# Patient Record
Sex: Male | Born: 1950 | Race: Black or African American | Hispanic: No | Smoking: Never smoker
Health system: Southern US, Community
[De-identification: ages and names within clinical notes are randomized; demographics above are authoritative.]

## PROBLEM LIST (undated history)

## (undated) DIAGNOSIS — F32A Depression, unspecified: Secondary | ICD-10-CM

## (undated) DIAGNOSIS — R0600 Dyspnea, unspecified: Secondary | ICD-10-CM

## (undated) DIAGNOSIS — I219 Acute myocardial infarction, unspecified: Secondary | ICD-10-CM

## (undated) DIAGNOSIS — E785 Hyperlipidemia, unspecified: Secondary | ICD-10-CM

## (undated) DIAGNOSIS — R011 Cardiac murmur, unspecified: Secondary | ICD-10-CM

## (undated) DIAGNOSIS — I2510 Atherosclerotic heart disease of native coronary artery without angina pectoris: Secondary | ICD-10-CM

## (undated) DIAGNOSIS — I209 Angina pectoris, unspecified: Secondary | ICD-10-CM

## (undated) DIAGNOSIS — A809 Acute poliomyelitis, unspecified: Secondary | ICD-10-CM

## (undated) DIAGNOSIS — F419 Anxiety disorder, unspecified: Secondary | ICD-10-CM

## (undated) DIAGNOSIS — E119 Type 2 diabetes mellitus without complications: Secondary | ICD-10-CM

## (undated) DIAGNOSIS — I739 Peripheral vascular disease, unspecified: Secondary | ICD-10-CM

## (undated) DIAGNOSIS — I639 Cerebral infarction, unspecified: Secondary | ICD-10-CM

## (undated) DIAGNOSIS — I509 Heart failure, unspecified: Secondary | ICD-10-CM

## (undated) DIAGNOSIS — J45909 Unspecified asthma, uncomplicated: Secondary | ICD-10-CM

## (undated) DIAGNOSIS — I10 Essential (primary) hypertension: Secondary | ICD-10-CM

## (undated) DIAGNOSIS — F329 Major depressive disorder, single episode, unspecified: Secondary | ICD-10-CM

## (undated) DIAGNOSIS — M1990 Unspecified osteoarthritis, unspecified site: Secondary | ICD-10-CM

## (undated) DIAGNOSIS — N186 End stage renal disease: Secondary | ICD-10-CM

## (undated) HISTORY — DX: Type 2 diabetes mellitus without complications: E11.9

## (undated) HISTORY — PX: CORONARY ANGIOPLASTY: SHX604

## (undated) HISTORY — DX: Hyperlipidemia, unspecified: E78.5

## (undated) HISTORY — DX: Essential (primary) hypertension: I10

## (undated) HISTORY — PX: ENUCLEATION: SHX628

---

## 2001-07-04 HISTORY — PX: LEG AMPUTATION BELOW KNEE: SHX694

## 2004-02-02 DIAGNOSIS — S88919A Complete traumatic amputation of unspecified lower leg, level unspecified, initial encounter: Secondary | ICD-10-CM | POA: Insufficient documentation

## 2005-07-04 DIAGNOSIS — I639 Cerebral infarction, unspecified: Secondary | ICD-10-CM | POA: Insufficient documentation

## 2008-11-11 DIAGNOSIS — E113299 Type 2 diabetes mellitus with mild nonproliferative diabetic retinopathy without macular edema, unspecified eye: Secondary | ICD-10-CM | POA: Insufficient documentation

## 2012-12-12 DIAGNOSIS — I509 Heart failure, unspecified: Secondary | ICD-10-CM | POA: Insufficient documentation

## 2013-08-27 DIAGNOSIS — I770 Arteriovenous fistula, acquired: Secondary | ICD-10-CM | POA: Insufficient documentation

## 2013-11-01 DIAGNOSIS — B182 Chronic viral hepatitis C: Secondary | ICD-10-CM | POA: Insufficient documentation

## 2014-01-03 DIAGNOSIS — H539 Unspecified visual disturbance: Secondary | ICD-10-CM | POA: Insufficient documentation

## 2014-01-04 DIAGNOSIS — G459 Transient cerebral ischemic attack, unspecified: Secondary | ICD-10-CM | POA: Insufficient documentation

## 2014-03-17 DIAGNOSIS — E559 Vitamin D deficiency, unspecified: Secondary | ICD-10-CM | POA: Insufficient documentation

## 2014-12-04 DIAGNOSIS — E663 Overweight: Secondary | ICD-10-CM | POA: Insufficient documentation

## 2015-04-06 DIAGNOSIS — Z794 Long term (current) use of insulin: Secondary | ICD-10-CM | POA: Insufficient documentation

## 2015-10-27 DIAGNOSIS — I5022 Chronic systolic (congestive) heart failure: Secondary | ICD-10-CM | POA: Insufficient documentation

## 2016-01-21 DIAGNOSIS — F410 Panic disorder [episodic paroxysmal anxiety] without agoraphobia: Secondary | ICD-10-CM | POA: Insufficient documentation

## 2016-07-29 DIAGNOSIS — R58 Hemorrhage, not elsewhere classified: Secondary | ICD-10-CM | POA: Insufficient documentation

## 2016-12-09 ENCOUNTER — Encounter (INDEPENDENT_AMBULATORY_CARE_PROVIDER_SITE_OTHER): Payer: Self-pay | Admitting: Vascular Surgery

## 2016-12-12 ENCOUNTER — Encounter (INDEPENDENT_AMBULATORY_CARE_PROVIDER_SITE_OTHER): Payer: Self-pay | Admitting: Vascular Surgery

## 2016-12-12 ENCOUNTER — Ambulatory Visit (INDEPENDENT_AMBULATORY_CARE_PROVIDER_SITE_OTHER): Payer: Medicare Other | Admitting: Vascular Surgery

## 2016-12-12 DIAGNOSIS — E1152 Type 2 diabetes mellitus with diabetic peripheral angiopathy with gangrene: Secondary | ICD-10-CM

## 2016-12-12 DIAGNOSIS — E7800 Pure hypercholesterolemia, unspecified: Secondary | ICD-10-CM

## 2016-12-12 DIAGNOSIS — I10 Essential (primary) hypertension: Secondary | ICD-10-CM

## 2016-12-12 DIAGNOSIS — I7025 Atherosclerosis of native arteries of other extremities with ulceration: Secondary | ICD-10-CM | POA: Diagnosis not present

## 2016-12-12 DIAGNOSIS — L97909 Non-pressure chronic ulcer of unspecified part of unspecified lower leg with unspecified severity: Secondary | ICD-10-CM

## 2016-12-12 DIAGNOSIS — T8789 Other complications of amputation stump: Secondary | ICD-10-CM

## 2016-12-12 DIAGNOSIS — Z794 Long term (current) use of insulin: Secondary | ICD-10-CM | POA: Diagnosis not present

## 2016-12-20 DIAGNOSIS — T8789 Other complications of amputation stump: Secondary | ICD-10-CM

## 2016-12-20 DIAGNOSIS — I70219 Atherosclerosis of native arteries of extremities with intermittent claudication, unspecified extremity: Secondary | ICD-10-CM | POA: Insufficient documentation

## 2016-12-20 DIAGNOSIS — L97909 Non-pressure chronic ulcer of unspecified part of unspecified lower leg with unspecified severity: Secondary | ICD-10-CM | POA: Insufficient documentation

## 2016-12-20 DIAGNOSIS — I10 Essential (primary) hypertension: Secondary | ICD-10-CM | POA: Insufficient documentation

## 2016-12-20 DIAGNOSIS — E119 Type 2 diabetes mellitus without complications: Secondary | ICD-10-CM | POA: Insufficient documentation

## 2016-12-20 DIAGNOSIS — E7800 Pure hypercholesterolemia, unspecified: Secondary | ICD-10-CM | POA: Insufficient documentation

## 2016-12-20 NOTE — Progress Notes (Signed)
MRN : [id number]  Dennis Walker is a 66 y.o. ([birth year]) male who presents with chief complaint of  Chief Complaint  Patient presents with  . New Evaluation    Blisters around BKA  .  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  He states he developed the blisters of the right BKA stump after wearing his prosthesis for physical therapy. He had not been wearing his prosthesis routinely in months.  The patient notes the ulcer has been present for multiple weeks and has not been improving.  It is very painful and has had some drainage.  No specific history of trauma noted by the patient.  The patient denies fever or chills.  the patient does have diabetes which has been difficult to control.  Patient notes prior to the ulcer developing the extremities were painful particularly with ambulation or activity and the discomfort is very consistent day today. Typically, the pain occurs at less than one block, progress is as activity continues to the point that the patient must stop walking. Resting including standing still for several minutes allowed resumption of the activity and the ability to walk a similar distance before stopping again. Uneven terrain and inclined shorten the distance. The pain has been progressive over the past several years.   The patient denies rest pain or dangling of an extremity off the side of the bed during the night for relief. No prior interventions or surgeries.  No history of back problems or DJD of the lumbar sacral spine.   The patient denies amaurosis fugax or recent TIA symptoms. There are no recent neurological changes noted. The patient denies history of DVT, PE or superficial thrombophlebitis. The patient denies recent episodes of angina or shortness of breath.   Current Meds  Medication Sig  . albuterol (PROVENTIL  HFA;VENTOLIN HFA) 108 (90 Base) MCG/ACT inhaler Inhale into the lungs every 6 (six) hours as needed for wheezing or shortness of breath.  Marland Kitchen aspirin EC 81 MG tablet Take 81 mg by mouth daily.  Marland Kitchen atorvastatin (LIPITOR) 80 MG tablet Take 80 mg by mouth daily.  Marland Kitchen b complex vitamins capsule Take 1 capsule by mouth daily.  . clopidogrel (PLAVIX) 75 MG tablet Take 75 mg by mouth daily.  . ferrous sulfate 325 (65 FE) MG tablet Take 325 mg by mouth daily with breakfast.  . gabapentin (NEURONTIN) 100 MG capsule Take 100 mg by mouth 2 (two) times daily.  . Insulin Detemir (LEVEMIR FLEXPEN) 100 UNIT/ML Pen Inject 20 Units into the skin daily at 10 pm.  . lactulose, encephalopathy, (CHRONULAC) 10 GM/15ML SOLN Take by mouth.  Marland Kitchen lisinopril (PRINIVIL,ZESTRIL) 5 MG tablet Take 5 mg by mouth daily.  . Melatonin 1 MG CAPS Take by mouth.  . metolazone (ZAROXOLYN) 5 MG tablet Take 5 mg by mouth daily.  . metoprolol succinate (TOPROL-XL) 100 MG 24 hr tablet Take 100 mg by mouth daily. Take with or immediately following a meal.  . oxyCODONE-acetaminophen (PERCOCET/ROXICET) 5-325 MG tablet Take by mouth every 4 (four) hours as needed for severe pain.  . polyethylene glycol (MIRALAX / GLYCOLAX) packet Take 17 g by mouth daily.  . ranolazine (RANEXA) 500 MG 12 hr tablet Take 500 mg by mouth 2 (two) times daily.  Marland Kitchen senna (SENOKOT) 8.6 MG TABS tablet Take 1 tablet by mouth.  . sertraline (ZOLOFT) 100 MG tablet Take 100 mg  by mouth daily.  . sevelamer carbonate (RENVELA) 800 MG tablet Take 800 mg by mouth 3 (three) times daily with meals.    Past Medical History:  Diagnosis Date  . Chronic kidney disease   . Diabetes mellitus without complication ([place])   . Hyperlipidemia   . Hypertension     No past surgical history on file.  Social History Social History  Substance Use Topics  . Smoking status: Never Smoker  . Smokeless tobacco: Never Used  . Alcohol use No    Family History No family history on file. No  family history of bleeding/clotting disorders, porphyria or autoimmune disease   Allergies  Allergen Reactions  . Sulfa Antibiotics      REVIEW OF SYSTEMS (Negative unless checked)  Constitutional: [] Weight loss  [] Fever  [] Chills Cardiac: [] Chest pain   [] Chest pressure   [] Palpitations   [] Shortness of breath when laying flat   [] Shortness of breath with exertion. Vascular:  [x] Pain in legs with walking   [x] Pain in legs at rest  [] History of DVT   [] Phlebitis   [] Swelling in legs   [] Varicose veins   [x] Non-healing ulcers Pulmonary:   [] Uses home oxygen   [] Productive cough   [] Hemoptysis   [] Wheeze  [] COPD   [] Asthma Neurologic:  [] Dizziness   [] Seizures   [] History of stroke   [] History of TIA  [] Aphasia   [] Vissual changes   [] Weakness or numbness in arm   [] Weakness or numbness in leg Musculoskeletal:   [x] Joint swelling   [x] Joint pain   [] Low back pain Hematologic:  [] Easy bruising  [] Easy bleeding   [] Hypercoagulable state   [] Anemic Gastrointestinal:  [] Diarrhea   [] Vomiting  [] Gastroesophageal reflux/heartburn   [] Difficulty swallowing. Genitourinary:  [x] Chronic kidney disease   [] Difficult urination  [] Frequent urination   [] Blood in urine Skin:  [] Rashes   [] Ulcers  Psychological:  [] History of anxiety   []  History of major depression.  Physical Examination  Vitals:   12/12/16 1447  BP: 103/63  Pulse: (!) 58  Resp: 15  Weight: 83 kg (183 lb)  Height: 5' 9.5" (1.765 m)   Body mass index is 26.64 kg/m. Gen: WD/WN, NAD Head: /AT, No temporalis wasting.  Ear/Nose/Throat: Hearing grossly intact, nares w/o erythema or drainage, poor dentition Eyes: PER, EOMI, sclera nonicteric.  Neck: Supple, no masses.  No bruit or JVD.  Pulmonary:  Good air movement, clear to auscultation bilaterally, no use of accessory muscles.  Cardiac: RRR, normal S1, S2, no Murmurs. Vascular: Right lower knee amputation stump with 2 superficial blisters/ulcerations. No evidence of infection  no odor noted drainage.  Vessel Right Left  Radial Palpable Palpable  Femoral Not Palpable Not Palpable  Popliteal Not Palpable Not Palpable  PT BKA Not Palpable  DP BKA Not Palpable   Gastrointestinal: soft, non-distended. No guarding/no peritoneal signs.  Musculoskeletal: M/S 5/5 throughout.  No deformity or atrophy.  Neurologic: CN 2-12 intact. Pain and light touch intact in extremities.  Symmetrical.  Speech is fluent. Motor exam as listed above. Psychiatric: Judgment intact, Mood & affect appropriate for pt's clinical situation. Dermatologic: No rashes or ulcers noted.  No changes consistent with cellulitis. Lymph : No Cervical lymphadenopathy, no lichenification or skin changes of chronic lymphedema.  CBC No results found for: WBC, HGB, HCT, MCV, PLT  BMET No results found for: NA, K, CL, CO2, GLUCOSE, BUN, CREATININE, CALCIUM, GFRNONAA, GFRAA CrCl cannot be calculated (No order found.).  COAG No results found for: INR, PROTIME  Radiology No  results found.  Assessment/Plan 1. Atherosclerosis of native arteries of the extremities with ulceration ([place]) Recommend:  Patient should undergo arterial duplex of the lower extremity ASAP because there has been a significant deterioration in the patient's lower extremity status.  The patient states they are having increased pain and a marked decrease in the distance that they can walk.  The risks and benefits as well as the alternatives were discussed in detail with the patient.  All questions were answered.  Patient agrees to proceed and understands this could be a prelude to angiography and intervention.  The patient will follow up with me in the office to review the studies.  - VAS US AORTA/IVC/ILIACS; Future  2. Ulcer of amputation stump of lower extremity ([place]) See  #1 - VAS US AORTA/IVC/ILIACS; Future  3. Type 2 diabetes mellitus with diabetic peripheral angiopathy and gangrene, with long-term current use of insulin  (HCC) Continue hypoglycemic medications as already ordered, these medications have been reviewed and there are no changes at this time.  Hgb A1C to be monitored as already arranged by primary service   4. Essential hypertension Continue antihypertensive medications as already ordered, these medications have been reviewed and there are no changes at this time.   5. Pure hypercholesterolemia Continue statin as ordered and reviewed, no changes at this time     Hortencia Pilar, MD  12/20/2016 12:31 PM

## 2016-12-26 ENCOUNTER — Encounter (INDEPENDENT_AMBULATORY_CARE_PROVIDER_SITE_OTHER): Payer: Medicare Other

## 2016-12-26 ENCOUNTER — Ambulatory Visit (INDEPENDENT_AMBULATORY_CARE_PROVIDER_SITE_OTHER): Payer: Medicare Other | Admitting: Vascular Surgery

## 2017-01-05 ENCOUNTER — Encounter (INDEPENDENT_AMBULATORY_CARE_PROVIDER_SITE_OTHER): Payer: Medicare Other

## 2017-01-05 ENCOUNTER — Ambulatory Visit (INDEPENDENT_AMBULATORY_CARE_PROVIDER_SITE_OTHER): Payer: Medicare Other | Admitting: Vascular Surgery

## 2017-01-26 ENCOUNTER — Encounter (INDEPENDENT_AMBULATORY_CARE_PROVIDER_SITE_OTHER): Payer: Self-pay

## 2017-01-27 ENCOUNTER — Other Ambulatory Visit (INDEPENDENT_AMBULATORY_CARE_PROVIDER_SITE_OTHER): Payer: Self-pay

## 2017-01-31 ENCOUNTER — Encounter
Admission: RE | Disposition: A | Discharge status: Skilled Nursing Facility | Payer: Self-pay | Source: Ambulatory Visit | Attending: Vascular Surgery

## 2017-01-31 ENCOUNTER — Ambulatory Visit
Admission: RE | Admit: 2017-01-31 | Discharge: 2017-01-31 | Disposition: A | Discharge status: Skilled Nursing Facility | Payer: Medicare Other | Source: Ambulatory Visit | Attending: Vascular Surgery | Admitting: Vascular Surgery

## 2017-01-31 DIAGNOSIS — I2510 Atherosclerotic heart disease of native coronary artery without angina pectoris: Secondary | ICD-10-CM | POA: Insufficient documentation

## 2017-01-31 DIAGNOSIS — I120 Hypertensive chronic kidney disease with stage 5 chronic kidney disease or end stage renal disease: Secondary | ICD-10-CM | POA: Insufficient documentation

## 2017-01-31 DIAGNOSIS — Y832 Surgical operation with anastomosis, bypass or graft as the cause of abnormal reaction of the patient, or of later complication, without mention of misadventure at the time of the procedure: Secondary | ICD-10-CM | POA: Insufficient documentation

## 2017-01-31 DIAGNOSIS — T82868A Thrombosis of vascular prosthetic devices, implants and grafts, initial encounter: Secondary | ICD-10-CM

## 2017-01-31 DIAGNOSIS — T82858A Stenosis of vascular prosthetic devices, implants and grafts, initial encounter: Secondary | ICD-10-CM | POA: Insufficient documentation

## 2017-01-31 DIAGNOSIS — Z992 Dependence on renal dialysis: Secondary | ICD-10-CM | POA: Diagnosis not present

## 2017-01-31 DIAGNOSIS — N186 End stage renal disease: Secondary | ICD-10-CM

## 2017-01-31 DIAGNOSIS — Z882 Allergy status to sulfonamides status: Secondary | ICD-10-CM | POA: Diagnosis not present

## 2017-01-31 DIAGNOSIS — Z91013 Allergy to seafood: Secondary | ICD-10-CM | POA: Insufficient documentation

## 2017-01-31 DIAGNOSIS — E785 Hyperlipidemia, unspecified: Secondary | ICD-10-CM | POA: Insufficient documentation

## 2017-01-31 DIAGNOSIS — Z881 Allergy status to other antibiotic agents status: Secondary | ICD-10-CM | POA: Diagnosis not present

## 2017-01-31 DIAGNOSIS — E1122 Type 2 diabetes mellitus with diabetic chronic kidney disease: Secondary | ICD-10-CM | POA: Diagnosis not present

## 2017-01-31 DIAGNOSIS — I10 Essential (primary) hypertension: Secondary | ICD-10-CM | POA: Diagnosis not present

## 2017-01-31 HISTORY — PX: A/V FISTULAGRAM: CATH118298

## 2017-01-31 LAB — GLUCOSE, CAPILLARY
GLUCOSE-CAPILLARY: 84 mg/dL (ref 65–99)
GLUCOSE-CAPILLARY: 134 mg/dL — AB (ref 65–99)
GLUCOSE-CAPILLARY: 148 mg/dL — AB (ref 65–99)
Glucose-Capillary: 84 mg/dL (ref 65–99)

## 2017-01-31 LAB — POTASSIUM (ARMC VASCULAR LAB ONLY): POTASSIUM (ARMC VASCULAR LAB): 4.5 (ref 3.5–5.1)

## 2017-01-31 SURGERY — A/V FISTULAGRAM
Anesthesia: Moderate Sedation | Site: Arm Upper | Laterality: Left

## 2017-01-31 MED ORDER — FAMOTIDINE 20 MG PO TABS
40.0000 mg | ORAL_TABLET | Freq: Once | ORAL | Status: AC
  Administered 2017-01-31: 40 mg via ORAL

## 2017-01-31 MED ORDER — HEPARIN SODIUM (PORCINE) 1000 UNIT/ML IJ SOLN
INTRAMUSCULAR | Status: AC
  Filled 2017-01-31: qty 1

## 2017-01-31 MED ORDER — CLINDAMYCIN PHOSPHATE 300 MG/50ML IV SOLN
INTRAVENOUS | Status: AC
  Administered 2017-01-31: 300 mg via INTRAVENOUS
  Filled 2017-01-31: qty 50

## 2017-01-31 MED ORDER — METHYLPREDNISOLONE SODIUM SUCC 125 MG IJ SOLR
125.0000 mg | Freq: Once | INTRAMUSCULAR | Status: AC
  Administered 2017-01-31: 125 mg via INTRAVENOUS

## 2017-01-31 MED ORDER — DEXTROSE 50 % IV SOLN
12.5000 g | Freq: Once | INTRAVENOUS | Status: AC
  Administered 2017-01-31: 12.5 g via INTRAVENOUS

## 2017-01-31 MED ORDER — LIDOCAINE HCL (PF) 1 % IJ SOLN
INTRAMUSCULAR | Status: AC
  Filled 2017-01-31: qty 30

## 2017-01-31 MED ORDER — FENTANYL CITRATE (PF) 100 MCG/2ML IJ SOLN
INTRAMUSCULAR | Status: DC | PRN
  Administered 2017-01-31: 25 ug via INTRAVENOUS
  Administered 2017-01-31: 50 ug via INTRAVENOUS

## 2017-01-31 MED ORDER — MIDAZOLAM HCL 5 MG/5ML IJ SOLN
INTRAMUSCULAR | Status: AC
  Filled 2017-01-31: qty 5

## 2017-01-31 MED ORDER — MIDAZOLAM HCL 2 MG/2ML IJ SOLN
INTRAMUSCULAR | Status: DC | PRN
  Administered 2017-01-31 (×2): 0.5 mg via INTRAVENOUS
  Administered 2017-01-31: 2 mg via INTRAVENOUS

## 2017-01-31 MED ORDER — FAMOTIDINE 20 MG PO TABS
ORAL_TABLET | ORAL | Status: AC
  Filled 2017-01-31: qty 2

## 2017-01-31 MED ORDER — SODIUM CHLORIDE 0.9 % IV SOLN
INTRAVENOUS | Status: DC
  Administered 2017-01-31: 13:00:00 via INTRAVENOUS

## 2017-01-31 MED ORDER — DEXTROSE 50 % IV SOLN
INTRAVENOUS | Status: AC
  Filled 2017-01-31: qty 50

## 2017-01-31 MED ORDER — FENTANYL CITRATE (PF) 100 MCG/2ML IJ SOLN
INTRAMUSCULAR | Status: AC
  Filled 2017-01-31: qty 2

## 2017-01-31 MED ORDER — CLINDAMYCIN PHOSPHATE 300 MG/50ML IV SOLN
300.0000 mg | Freq: Once | INTRAVENOUS | Status: AC
  Administered 2017-01-31: 300 mg via INTRAVENOUS

## 2017-01-31 MED ORDER — METHYLPREDNISOLONE SODIUM SUCC 125 MG IJ SOLR
INTRAMUSCULAR | Status: AC
  Filled 2017-01-31: qty 2

## 2017-01-31 MED ORDER — CEFAZOLIN SODIUM-DEXTROSE 2-4 GM/100ML-% IV SOLN: 2.0000 g | Freq: Once | INTRAVENOUS | Status: DC

## 2017-01-31 SURGICAL SUPPLY — 26 items
BALLN ATG 12X4X80 (BALLOONS) ×3
BALLN DORADO 6X40X80 (BALLOONS) ×3
BALLN DORADO 8X20X80 (BALLOONS) ×3
BALLN LUTONIX DCB 4X40X130 (BALLOONS) ×3
BALLN ULTRVRSE 4X40X130C (BALLOONS)
BALLOON ATG 12X4X80 (BALLOONS) ×1 IMPLANT
BALLOON DORADO 6X40X80 (BALLOONS) ×1 IMPLANT
BALLOON DORADO 8X20X80 (BALLOONS) ×1 IMPLANT
BALLOON LUTONIX DCB 4X40X130 (BALLOONS) ×1 IMPLANT
BALLOON ULTRVRSE 4X40X130C (BALLOONS) IMPLANT
CATH BEACON 5 .035 40 KMP TP (CATHETERS) ×1 IMPLANT
CATH BEACON 5 .035 65 KMP TIP (CATHETERS) ×3 IMPLANT
CATH BEACON 5 .038 40 KMP TP (CATHETERS) ×2
DEVICE PRESTO INFLATION (MISCELLANEOUS) ×3 IMPLANT
DEVICE TORQUE (MISCELLANEOUS) ×3 IMPLANT
DRAPE BRACHIAL (DRAPES) ×3 IMPLANT
GUIDEWIRE ANGLED .035 180CM (WIRE) ×3 IMPLANT
NEEDLE ENTRY 21GA 7CM ECHOTIP (NEEDLE) ×6 IMPLANT
PACK ANGIOGRAPHY (CUSTOM PROCEDURE TRAY) ×3 IMPLANT
SET INTRO CAPELLA COAXIAL (SET/KITS/TRAYS/PACK) ×6 IMPLANT
SHEATH BRITE TIP 6FRX5.5 (SHEATH) ×3 IMPLANT
SHEATH BRITE TIP 7FRX5.5 (SHEATH) ×3 IMPLANT
STENT LIFESTAR 12X40 (Permanent Stent) ×3 IMPLANT
SUT MNCRL AB 4-0 PS2 18 (SUTURE) ×3 IMPLANT
TOWEL OR 17X26 4PK STRL BLUE (TOWEL DISPOSABLE) ×3 IMPLANT
WIRE MAGIC TORQUE 260C (WIRE) ×3 IMPLANT

## 2017-01-31 NOTE — Op Note (Signed)
OPERATIVE NOTE   PROCEDURE: 1. Contrast injection left arm  AV access 2. Percutaneous transluminal angioplasty peripheral segment to 8 mm at the venous anastomosis and 6 mm at the arterial anastomosis 3. Percutaneous transluminal angioplasty and stent placement central venous segment left subclavian vein to 12 mm with a life*stent  PRE-OPERATIVE DIAGNOSIS: Complication of dialysis access                                                       End Stage Renal Disease  POST-OPERATIVE DIAGNOSIS: same as above   SURGEON: Renford Dills, M.D.  ANESTHESIA: Conscious sedation was administered under my direct supervision by the interventional radiology RN. IV Versed plus fentanyl were utilized. Continuous ECG, pulse oximetry and blood pressure was monitored throughout the entire procedure.  Conscious sedation was for a total of 58 minutes.  ESTIMATED BLOOD LOSS: minimal  FINDING(S): Stricture of the AV graft within the peripheral segment as well as a stricture within the central venous portion  SPECIMEN(S):  None  CONTRAST: 70 cc  FLUOROSCOPY TIME: 14.4 minutes  INDICATIONS: Dennis Walker is a 66 y.o. male who  presents with malfunctioning left arm AV access.  The patient is scheduled for angiography with possible intervention of the AV access.  The patient is aware the risks include but are not limited to: bleeding, infection, thrombosis of the cannulated access, and possible anaphylactic reaction to the contrast.  The patient acknowledges if the access can not be salvaged a tunneled catheter will be needed and will be placed during this procedure.  The patient is aware of the risks of the procedure and elects to proceed with the angiogram and intervention.  DESCRIPTION: After full informed written consent was obtained, the patient was brought back to the Special Procedure suite and placed supine position.  Appropriate cardiopulmonary monitors were placed.  The left arm was prepped and  draped in the standard fashion.  Appropriate timeout is called. The left arm AV graft  was cannulated with a micropuncture needle.  Cannulation was performed with ultrasound guidance. Ultrasound was placed in a sterile sleeve, the AV access was interrogated and noted to be echolucent and compressible indicating patency. Image was recorded for the permanent record. The puncture is performed under continuous ultrasound visualization.   The microwire was advanced and the needle was exchanged for  a microsheath.  The J-wire was then advanced and a 6 Fr sheath inserted.  Hand injections were completed to image the access from the arterial anastomosis through the entire access.  The central venous structures were also imaged by hand injections after negotiating a KMP catheter and floppy Glidewire into the central venous anatomy. Also, later in the case a second sheath in the retrograde direction was required this was placed as described above using ultrasound recording an image making puncture under direct visualization. This then allowed for imaging of the highbred AV access at the actual arterial anastomosis.  Based on the images,  4000 units of heparin was given and a wire was negotiated through the strictures within the venous portion of the graft as well as the central stenosis. The sheath was then upsized to a 7 [place] sheath.  With magnified views of the left subclavian a 12 x 40-like*stent was deployed across the stricture and initially was postdilated with an 8 mm  Dorado balloon and then it was more completely expanded using a 12 mm x 40 mm Dorado balloon.  Inflation was to 28 atm for 1 minute.  The detector was then repositioned over the peripheral portion of the AV access and 8 mm x 20 mm Dorado balloon was used to treat the stricture within the AV access at the venous anastomosis. Inflation was to 16 for 1 minutes.  As noted above a retrograde sheath was then inserted and the Kumpe catheter with a  Glidewire negotiated into the actual arterial system hand injection contrast was used to verify the arterial anatomy the graft itself appears to be anastomosed to the ulnar artery there appears to be a large segment crossing the elbow which was likely a basilic transposition. Mild to moderate narrowing at the actual arterial anastomosis was noted and this was dilated with a 5 mm Lutonix balloon. The detector was then repositioned and the anastomosis between the basilic vein and the PTFE jump graft was noted to have an 80% stenosis and this was treated with a 6 mm Dorado balloon inflation to approximately 30 atm for 1 minute. Follow-up imaging demonstrated resolution of the stenosis.  Follow-up imaging demonstrates significant improvement with a marked reduction of the strictures.  There is now rapid flow of contrast through the graft and the central veins.   A 4-0 Monocryl purse-string suture was sewn around the sheaths.  The sheaths were removed and light pressure was applied.  A sterile bandage was applied to the puncture site.    COMPLICATIONS: None  CONDITION: [name], M.D [facility] Office: [phone]  01/31/2017 2:53 PM

## 2017-01-31 NOTE — OR Nursing (Signed)
1150- PT complained of his blood sugar being low and his vision was blurred. BG check and it was 84. Dr. Delana Meyer was notified by Loletha Carrow, RN and orders were received to give 12.5 Grams of D50 now. D50 was given at 1155. Will continue to monitor and check his BG again at 12:30 if not gone back to surgery.

## 2017-01-31 NOTE — H&P (Signed)
[facility] Admission History & Physical  MRN : [id number]  Dennis Walker is a 66 y.o. (05-04-1951) male who presents with chief complaint of No chief complaint on file. Marland Kitchen  History of Present Illness: I am asked to evaluate the patient by the dialysis center. The patient was sent here because they were unable to achieve adequate dialysis yesterday morning using his left arm brachial axillary graft. Furthermore the Center states there is very poor thrill and bruit. The patient states there there have been increasing problems with the access, such as "pulling clots" during dialysis and prolonged bleeding after decannulation. The patient estimates these problems have been going on for several weeks. The patient is unaware of any other change.  Patient denies pain or tenderness overlying the access.  There is no pain with dialysis.  The patient denies hand pain or finger pain consistent with steal syndrome.   There have been past interventions but no recent declots of this access.  The patient is not chronically hypotensive on dialysis.  Current Facility-Administered Medications  Medication Dose Route Frequency Provider Last Rate Last Dose  . 0.9 %  sodium chloride infusion   Intravenous Continuous Shemeca Lukasik, Dolores Lory, MD      . clindamycin (CLEOCIN) 300 MG/50ML IVPB           . clindamycin (CLEOCIN) IVPB 300 mg  300 mg Intravenous Once Matilde Markie, Dolores Lory, MD      . dextrose 50 % solution           . famotidine (PEPCID) 20 MG tablet           . methylPREDNISolone sodium succinate (SOLU-MEDROL) 125 mg/2 mL injection             Past Medical History:  Diagnosis Date  . Chronic kidney disease   . Diabetes mellitus without complication ([place])   . Hyperlipidemia   . Hypertension     No past surgical history on file.  Social History Social History  Substance Use Topics  . Smoking status: Never Smoker  . Smokeless tobacco: Never Used  . Alcohol use No    Family  History No family history on file.  No family history of bleeding or clotting disorders, autoimmune disease or porphyria  Allergies  Allergen Reactions  . Cefuroxime Itching  . Shrimp [Shellfish Allergy] Other (See Comments)    Per mar   . Sulfa Antibiotics Other (See Comments)    Per mar     REVIEW OF SYSTEMS (Negative unless checked)  Constitutional: [] Weight loss  [] Fever  [] Chills Cardiac: [] Chest pain   [] Chest pressure   [] Palpitations   [] Shortness of breath when laying flat   [] Shortness of breath at rest   [x] Shortness of breath with exertion. Vascular:  [] Pain in legs with walking   [] Pain in legs at rest   [] Pain in legs when laying flat   [] Claudication   [] Pain in feet when walking  [] Pain in feet at rest  [] Pain in feet when laying flat   [] History of DVT   [] Phlebitis   [] Swelling in legs   [] Varicose veins   [] Non-healing ulcers Pulmonary:   [] Uses home oxygen   [] Productive cough   [] Hemoptysis   [] Wheeze  [] COPD   [] Asthma Neurologic:  [] Dizziness  [] Blackouts   [] Seizures   [] History of stroke   [] History of TIA  [] Aphasia   [] Temporary blindness   [] Dysphagia   [] Weakness or numbness in arms   [] Weakness or numbness  in legs Musculoskeletal:  [] Arthritis   [] Joint swelling   [] Joint pain   [] Low back pain Hematologic:  [] Easy bruising  [] Easy bleeding   [] Hypercoagulable state   [] Anemic  [] Hepatitis Gastrointestinal:  [] Blood in stool   [] Vomiting blood  [] Gastroesophageal reflux/heartburn   [] Difficulty swallowing. Genitourinary:  [x] Chronic kidney disease   [] Difficult urination  [] Frequent urination  [] Burning with urination   [] Blood in urine Skin:  [] Rashes   [] Ulcers   [] Wounds Psychological:  [] History of anxiety   []  History of major depression.  Physical Examination  Vitals:   01/31/17 1005  BP: 133/73  Pulse: 62  Resp: 14  Temp: 98.2 F (36.8 C)  TempSrc: Oral  SpO2: 98%  Weight: 83 kg (183 lb)  Height: 5' 9.5" (1.765 m)   Body mass index is  26.64 kg/m. Gen: WD/WN, NAD Head: [place]/AT, No temporalis wasting. Prominent temp pulse not noted. Ear/Nose/Throat: Hearing grossly intact, nares w/o erythema or drainage, oropharynx w/o Erythema/Exudate,  Eyes: Conjunctiva clear, sclera non-icteric Neck: Trachea midline.  No JVD.  Pulmonary:  Good air movement, respirations not labored, no use of accessory muscles.  Cardiac: RRR, normal S1, S2. Vascular: Left brachial axillary dialysis graft markedly pulsatile poor thrill skin overlying the graft shows moderate wear but is otherwise intact no ulcerations present Vessel Right Left  Radial Palpable Palpable  Ulnar Not Palpable Not Palpable  Brachial Palpable Palpable  Carotid Palpable, without bruit Palpable, without bruit  Gastrointestinal: soft, non-tender/non-distended. No guarding/reflex.  Musculoskeletal: M/S 5/5 Both arms.  Extremities without ischemic changes.  No deformity or atrophy.  Neurologic: Sensation grossly intact in extremities.  Symmetrical.  Speech is fluent. Motor exam as listed above. Psychiatric: Judgment intact, Mood & affect appropriate for pt's clinical situation. Dermatologic: No rashes or ulcers noted.  No cellulitis or open wounds. Lymph : No Cervical, Axillary, or Inguinal lymphadenopathy.   CBC No results found for: WBC, HGB, HCT, MCV, PLT  BMET No results found for: NA, K, CL, CO2, GLUCOSE, BUN, CREATININE, CALCIUM, GFRNONAA, GFRAA CrCl cannot be calculated (No order found.).  COAG No results found for: INR, PROTIME  Radiology No results found.  Assessment/Plan 1.  Complication dialysis device with thrombosis AV access:  Patient's left brachial axillary dialysis access is malfunctioning. The patient will undergo angiography and correction of any problems using interventional techniques with the hope of restoring function to the access.  The risks and benefits were described to the patient.  All questions were answered.  The patient agrees to proceed  with angiography and intervention. Potassium will be drawn to ensure that it is an appropriate level prior to performing intervention. 2.  End-stage renal disease requiring hemodialysis:  Patient will continue dialysis therapy without further interruption if a successful intervention is not achieved then a tunneled catheter will be placed. Dialysis has already been arranged. 3.  Hypertension:  Patient will continue medical management; nephrology is following no changes in oral medications. 4. Diabetes mellitus:  Glucose will be monitored and oral medications been held this morning once the patient has undergone the patient's procedure po intake will be reinitiated and again Accu-Cheks will be used to assess the blood glucose level and treat as needed. The patient will be restarted on the patient's usual hypoglycemic regime 5.  Coronary artery disease:  EKG will be monitored. Nitrates will be used if needed. The patient's oral cardiac medications will be continued.    Hortencia Pilar, MD  01/31/2017 1:13 PM

## 2017-02-01 ENCOUNTER — Encounter: Payer: Self-pay | Admitting: Vascular Surgery

## 2017-02-02 ENCOUNTER — Observation Stay
Admission: EM | Admit: 2017-02-02 | Discharge: 2017-02-05 | Disposition: A | Discharge status: Skilled Nursing Facility | Payer: Medicare Other | Attending: Internal Medicine | Admitting: Internal Medicine

## 2017-02-02 ENCOUNTER — Encounter: Payer: Self-pay | Admitting: Emergency Medicine

## 2017-02-02 ENCOUNTER — Emergency Department: Payer: Medicare Other

## 2017-02-02 DIAGNOSIS — E1122 Type 2 diabetes mellitus with diabetic chronic kidney disease: Secondary | ICD-10-CM | POA: Diagnosis not present

## 2017-02-02 DIAGNOSIS — F329 Major depressive disorder, single episode, unspecified: Secondary | ICD-10-CM | POA: Diagnosis not present

## 2017-02-02 DIAGNOSIS — Z7902 Long term (current) use of antithrombotics/antiplatelets: Secondary | ICD-10-CM | POA: Diagnosis not present

## 2017-02-02 DIAGNOSIS — Z7982 Long term (current) use of aspirin: Secondary | ICD-10-CM | POA: Diagnosis not present

## 2017-02-02 DIAGNOSIS — R4182 Altered mental status, unspecified: Principal | ICD-10-CM | POA: Diagnosis present

## 2017-02-02 DIAGNOSIS — Z794 Long term (current) use of insulin: Secondary | ICD-10-CM | POA: Diagnosis not present

## 2017-02-02 DIAGNOSIS — E785 Hyperlipidemia, unspecified: Secondary | ICD-10-CM | POA: Diagnosis not present

## 2017-02-02 DIAGNOSIS — Z89511 Acquired absence of right leg below knee: Secondary | ICD-10-CM | POA: Insufficient documentation

## 2017-02-02 DIAGNOSIS — I69354 Hemiplegia and hemiparesis following cerebral infarction affecting left non-dominant side: Secondary | ICD-10-CM | POA: Diagnosis not present

## 2017-02-02 DIAGNOSIS — N186 End stage renal disease: Secondary | ICD-10-CM | POA: Diagnosis not present

## 2017-02-02 DIAGNOSIS — Z992 Dependence on renal dialysis: Secondary | ICD-10-CM | POA: Insufficient documentation

## 2017-02-02 DIAGNOSIS — I120 Hypertensive chronic kidney disease with stage 5 chronic kidney disease or end stage renal disease: Secondary | ICD-10-CM | POA: Diagnosis not present

## 2017-02-02 DIAGNOSIS — Z79899 Other long term (current) drug therapy: Secondary | ICD-10-CM | POA: Insufficient documentation

## 2017-02-02 HISTORY — DX: Cerebral infarction, unspecified: I63.9

## 2017-02-02 LAB — CBC
HCT: 40 % (ref 40.0–52.0)
HEMOGLOBIN: 13.2 g/dL (ref 13.0–18.0)
MCH: 30.4 pg (ref 26.0–34.0)
MCHC: 33 g/dL (ref 32.0–36.0)
MCV: 92.1 fL (ref 80.0–100.0)
PLATELETS: 182 10*3/uL (ref 150–440)
RBC: 4.35 MIL/uL — AB (ref 4.40–5.90)
RDW: 16.7 % — ABNORMAL HIGH (ref 11.5–14.5)
WBC: 7.9 10*3/uL (ref 3.8–10.6)

## 2017-02-02 LAB — BASIC METABOLIC PANEL
ANION GAP: 11 (ref 5–15)
BUN: 49 mg/dL — ABNORMAL HIGH (ref 6–20)
CALCIUM: 8.2 mg/dL — AB (ref 8.9–10.3)
CO2: 33 mmol/L — ABNORMAL HIGH (ref 22–32)
CREATININE: 7.29 mg/dL — AB (ref 0.61–1.24)
Chloride: 88 mmol/L — ABNORMAL LOW (ref 101–111)
GFR, EST AFRICAN AMERICAN: 8 mL/min — AB (ref >60)
GFR, EST NON AFRICAN AMERICAN: 7 mL/min — AB (ref >60)
Glucose, Bld: 128 mg/dL — ABNORMAL HIGH (ref 65–99)
Potassium: 3.8 mmol/L (ref 3.5–5.1)
SODIUM: 132 mmol/L — AB (ref 135–145)

## 2017-02-02 LAB — GLUCOSE, CAPILLARY: GLUCOSE-CAPILLARY: 134 mg/dL — AB (ref 65–99)

## 2017-02-02 MED ORDER — METOPROLOL SUCCINATE ER 100 MG PO TB24
100.0000 mg | ORAL_TABLET | Freq: Every day | ORAL | Status: DC
  Administered 2017-02-03 – 2017-02-05 (×3): 100 mg via ORAL
  Filled 2017-02-02 (×3): qty 1

## 2017-02-02 MED ORDER — ATORVASTATIN CALCIUM 20 MG PO TABS
40.0000 mg | ORAL_TABLET | Freq: Every day | ORAL | Status: DC
  Administered 2017-02-03 – 2017-02-04 (×2): 40 mg via ORAL
  Filled 2017-02-02 (×4): qty 2

## 2017-02-02 MED ORDER — MELATONIN 5 MG PO TABS
5.0000 mg | ORAL_TABLET | Freq: Every day | ORAL | Status: DC
  Administered 2017-02-02 – 2017-02-04 (×3): 5 mg via ORAL
  Filled 2017-02-02 (×4): qty 1

## 2017-02-02 MED ORDER — GABAPENTIN 100 MG PO CAPS
100.0000 mg | ORAL_CAPSULE | Freq: Two times a day (BID) | ORAL | Status: DC
  Administered 2017-02-03 – 2017-02-05 (×5): 100 mg via ORAL
  Filled 2017-02-02 (×5): qty 1

## 2017-02-02 MED ORDER — ONDANSETRON HCL 4 MG PO TABS: 4.0000 mg | ORAL_TABLET | Freq: Four times a day (QID) | ORAL | Status: DC | PRN

## 2017-02-02 MED ORDER — METOLAZONE 5 MG PO TABS
5.0000 mg | ORAL_TABLET | Freq: Every day | ORAL | Status: DC
  Administered 2017-02-03 – 2017-02-05 (×3): 5 mg via ORAL
  Filled 2017-02-02 (×3): qty 1

## 2017-02-02 MED ORDER — HEPARIN SODIUM (PORCINE) 5000 UNIT/ML IJ SOLN
5000.0000 [IU] | Freq: Three times a day (TID) | INTRAMUSCULAR | Status: DC
  Administered 2017-02-02 – 2017-02-05 (×7): 5000 [IU] via SUBCUTANEOUS
  Filled 2017-02-02 (×7): qty 1

## 2017-02-02 MED ORDER — SEVELAMER CARBONATE 800 MG PO TABS
1600.0000 mg | ORAL_TABLET | Freq: Three times a day (TID) | ORAL | Status: DC
  Administered 2017-02-03 – 2017-02-05 (×8): 1600 mg via ORAL
  Filled 2017-02-02 (×8): qty 2

## 2017-02-02 MED ORDER — ONDANSETRON HCL 4 MG/2ML IJ SOLN: 4.0000 mg | Freq: Four times a day (QID) | INTRAMUSCULAR | Status: DC | PRN

## 2017-02-02 MED ORDER — LINAGLIPTIN 5 MG PO TABS
2.5000 mg | ORAL_TABLET | Freq: Every day | ORAL | Status: DC
  Administered 2017-02-03 – 2017-02-05 (×3): 2.5 mg via ORAL
  Filled 2017-02-02 (×3): qty 1

## 2017-02-02 MED ORDER — LISINOPRIL 5 MG PO TABS
5.0000 mg | ORAL_TABLET | Freq: Every day | ORAL | Status: DC
  Administered 2017-02-03 – 2017-02-05 (×3): 5 mg via ORAL
  Filled 2017-02-02 (×3): qty 1

## 2017-02-02 MED ORDER — NITROGLYCERIN 0.4 MG SL SUBL
0.4000 mg | SUBLINGUAL_TABLET | SUBLINGUAL | Status: DC | PRN
Start: 2017-02-02 — End: 2017-02-05

## 2017-02-02 MED ORDER — FERROUS SULFATE 325 (65 FE) MG PO TABS
325.0000 mg | ORAL_TABLET | Freq: Two times a day (BID) | ORAL | Status: DC
  Administered 2017-02-03 – 2017-02-05 (×5): 325 mg via ORAL
  Filled 2017-02-02 (×5): qty 1

## 2017-02-02 MED ORDER — POLYETHYLENE GLYCOL 3350 17 G PO PACK
17.0000 g | PACK | Freq: Every day | ORAL | Status: DC
  Administered 2017-02-03 – 2017-02-05 (×2): 17 g via ORAL
  Filled 2017-02-02 (×2): qty 1

## 2017-02-02 MED ORDER — CLOPIDOGREL BISULFATE 75 MG PO TABS
75.0000 mg | ORAL_TABLET | Freq: Every day | ORAL | Status: DC
  Administered 2017-02-03 – 2017-02-04 (×2): 75 mg via ORAL
  Filled 2017-02-02 (×2): qty 1

## 2017-02-02 MED ORDER — SENNA 8.6 MG PO TABS
8.6000 mg | ORAL_TABLET | Freq: Two times a day (BID) | ORAL | Status: DC
  Administered 2017-02-02 – 2017-02-05 (×5): 8.6 mg via ORAL
  Filled 2017-02-02 (×5): qty 1

## 2017-02-02 MED ORDER — NEPRO/CARBSTEADY PO LIQD
237.0000 mL | Freq: Two times a day (BID) | ORAL | Status: DC
  Administered 2017-02-03 – 2017-02-05 (×5): 237 mL via ORAL

## 2017-02-02 MED ORDER — OXYCODONE-ACETAMINOPHEN 5-325 MG PO TABS
1.0000 | ORAL_TABLET | Freq: Four times a day (QID) | ORAL | Status: DC | PRN
Start: 2017-02-02 — End: 2017-02-05

## 2017-02-02 MED ORDER — ACETAMINOPHEN 325 MG PO TABS: 650.0000 mg | ORAL_TABLET | Freq: Four times a day (QID) | ORAL | Status: DC | PRN

## 2017-02-02 MED ORDER — RANOLAZINE ER 500 MG PO TB12
500.0000 mg | ORAL_TABLET | Freq: Two times a day (BID) | ORAL | Status: DC
  Administered 2017-02-02 – 2017-02-05 (×6): 500 mg via ORAL
  Filled 2017-02-02 (×7): qty 1

## 2017-02-02 MED ORDER — BUMETANIDE 2 MG PO TABS
2.0000 mg | ORAL_TABLET | Freq: Two times a day (BID) | ORAL | Status: DC
  Administered 2017-02-03 – 2017-02-05 (×5): 2 mg via ORAL
  Filled 2017-02-02 (×7): qty 1

## 2017-02-02 MED ORDER — SERTRALINE HCL 50 MG PO TABS
100.0000 mg | ORAL_TABLET | Freq: Every day | ORAL | Status: DC
  Administered 2017-02-03 – 2017-02-05 (×3): 100 mg via ORAL
  Filled 2017-02-02 (×3): qty 2

## 2017-02-02 MED ORDER — ASPIRIN EC 81 MG PO TBEC
81.0000 mg | DELAYED_RELEASE_TABLET | Freq: Every day | ORAL | Status: DC
  Administered 2017-02-03 – 2017-02-04 (×2): 81 mg via ORAL
  Filled 2017-02-02 (×3): qty 1

## 2017-02-02 MED ORDER — ACETAMINOPHEN 650 MG RE SUPP: 650.0000 mg | Freq: Four times a day (QID) | RECTAL | Status: DC | PRN

## 2017-02-02 NOTE — Progress Notes (Signed)
Patient new admit at shift change, after discussion with Pharmacist Christine patient's medications held  due to altered mental status/orientation -patient not able to take evening dose.

## 2017-02-02 NOTE — ED Triage Notes (Signed)
Pt presents to ED via [facility] from [facility] with c/o AMS and generalized weakness. EMS reports that patient lives at [facility], has a prosthetic to RLE. Pt is noted to be somewhat alert, is confused to situation and month and is slow to answer. EMS states unsure of what baseline is but reports that per [facility] pt is usually talking and making jokes. NAD noted at this time. Respirations even and unlabored, per EMS BP 127/71, 97% on RA, CBG 136.

## 2017-02-02 NOTE — H&P (Signed)
[facility] NAME: Dennis Walker    MR#:  [id number]  DATE OF BIRTH:  September 14, 1950  DATE OF ADMISSION:  02/02/2017  PRIMARY CARE PHYSICIAN: Juluis Pitch, MD   REQUESTING/REFERRING PHYSICIAN: Dr. Rudene Re  CHIEF COMPLAINT:   Chief Complaint  Patient presents with  . Altered Mental Status  . Weakness    HISTORY OF PRESENT ILLNESS:  Dennis Walker  is a 66 y.o. male with a known history of End-stage renal disease on hemodialysis, hypertension, hyperlipidemia, history of previous CVA with left-sided weakness, secondary hyperparathyroidism who presents to the hospital after hemodialysis today and noted to be significantly confused and altered. Patient presently is a very poor historian therefore most of the history obtained from the chart and the emergency room. Patient was in his usual state of health and went for hemodialysis today and after his session he was more confused than usual and mumbling and therefore sent to the ER for further evaluation. In the emergency room patient remained confused and altered and sometimes staring spells. This is significantly different from patient's baseline as per the nursing home where he resides at. Hospitalist services were therefore consulted for further evaluation.  PAST MEDICAL HISTORY:   Past Medical History:  Diagnosis Date  . Chronic kidney disease   . Diabetes mellitus without complication ([place])   . Hyperlipidemia   . Hypertension   . Stroke [facility])     PAST SURGICAL HISTORY:   Past Surgical History:  Procedure Laterality Date  . A/V FISTULAGRAM Left 01/31/2017   Procedure: A/V Fistulagram;  Surgeon: Katha Cabal, MD;  Location: [facility];  Service: Cardiovascular;  Laterality: Left;    SOCIAL HISTORY:   Social History  Substance Use Topics  . Smoking status: Never Smoker  . Smokeless tobacco: Never Used  . Alcohol use No    FAMILY HISTORY:   Family History   Problem Relation Age of Onset  . Diabetes Neg Hx   . Cancer Neg Hx   . Hypertension Neg Hx     DRUG ALLERGIES:   Allergies  Allergen Reactions  . Cefuroxime Itching  . Shrimp [Shellfish Allergy] Other (See Comments)    Per mar   . Sulfa Antibiotics Other (See Comments)    Per mar    REVIEW OF SYSTEMS:   Review of Systems  Unable to perform ROS: Mental acuity    MEDICATIONS AT HOME:   Prior to Admission medications   Medication Sig Start Date End Date Taking? Authorizing Provider  aspirin EC 81 MG tablet Take 81 mg by mouth daily at 6 PM. (1800)   Yes [provider]  atorvastatin (LIPITOR) 40 MG tablet Take 40 mg by mouth daily at 6 PM. (1800)   Yes [provider]  b complex vitamins capsule Take 1 capsule by mouth daily. (1800)   Yes [provider]  bumetanide (BUMEX) 2 MG tablet Take 2 mg by mouth 2 (two) times daily. (0600 & 1800)   Yes [provider]  clopidogrel (PLAVIX) 75 MG tablet Take 75 mg by mouth daily at 6 PM. (1800)   Yes [provider]  ferrous sulfate 325 (65 FE) MG tablet Take 325 mg by mouth 2 (two) times daily. (0600 &1800)   Yes [provider]  gabapentin (NEURONTIN) 100 MG capsule Take 100 mg by mouth 2 (two) times daily. (0600 & 1800)   Yes [provider]  gabapentin (NEURONTIN) 100 MG  capsule Take 400 mg by mouth daily as needed (for pain.).   Yes [provider]  Insulin Detemir (LEVEMIR FLEXPEN) 100 UNIT/ML Pen Inject 24 Units into the skin daily at 10 pm. (2100)   Yes [provider]  lactulose (CHRONULAC) 10 GM/15ML solution Take 20 g by mouth at bedtime. (2100)   Yes [provider]  lidocaine-prilocaine (EMLA) cream Apply 1 application topically Every Tuesday,Thursday,and Saturday with dialysis. (0500)   Yes [provider]  linagliptin (TRADJENTA) 5 MG TABS tablet Take 2.5 mg by mouth daily at 6 (six) AM. (0630)   Yes [provider]   lisinopril (PRINIVIL,ZESTRIL) 5 MG tablet Take 5 mg by mouth daily. (0600)   Yes [provider]  Melatonin 3 MG TABS Take 3 mg by mouth daily at 10 pm. (2100)   Yes [provider]  metolazone (ZAROXOLYN) 5 MG tablet Take 5 mg by mouth daily. (0600)   Yes [provider]  metoprolol succinate (TOPROL-XL) 100 MG 24 hr tablet Take 100 mg by mouth daily. (1800) Take with or immediately following a meal.   Yes [provider]  nitroGLYCERIN (NITROSTAT) 0.4 MG SL tablet Place 0.4 mg under the tongue every 5 (five) minutes as needed for chest pain.   Yes [provider]  oxyCODONE-acetaminophen (PERCOCET/ROXICET) 5-325 MG tablet Take 1 tablet by mouth every 6 (six) hours as needed (for pain.).    Yes [provider]  polyethylene glycol (MIRALAX / GLYCOLAX) packet Take 17 g by mouth daily. (1800)   Yes [provider]  ranolazine (RANEXA) 500 MG 12 hr tablet Take 500 mg by mouth 2 (two) times daily. (0600 &1800)   Yes [provider]  senna (SENOKOT) 8.6 MG TABS tablet Take 8.6 mg by mouth 2 (two) times daily. (0600 & 1800)   Yes [provider]  sertraline (ZOLOFT) 100 MG tablet Take 100 mg by mouth daily. (2100)   Yes [provider]  sevelamer carbonate (RENVELA) 800 MG tablet Take 1,600 mg by mouth 3 (three) times daily with meals. (1230, 1800, 2100)   Yes [provider]  Skin Protectants, Misc. (EUCERIN) cream Apply 1 application topically 2 (two) times daily as needed (for pruritus).   Yes [provider]  Nutritional Supplements (FEEDING SUPPLEMENT, NEPRO CARB STEADY,) LIQD Take 237 mLs by mouth 2 (two) times daily. (0900 & 1800)    [provider]      VITAL SIGNS:  Blood pressure 121/69, pulse 63, temperature 98.2 F (36.8 C), temperature source Oral, resp. rate 13, height 5\' 9"  (1.753 m), weight 84.4 kg (186 lb), SpO2 100 %.  PHYSICAL EXAMINATION:  Physical  Exam  GENERAL:  66 y.o.-year-old patient lying in the bed Encephalopathic and following simple commands.  EYES: Pupils equal, round, reactive to light and accommodation. No scleral icterus. Extraocular muscles intact.  HEENT: Head atraumatic, normocephalic. Oropharynx and nasopharynx clear. No oropharyngeal erythema, moist oral mucosa  NECK:  Supple, no jugular venous distention. No thyroid enlargement, no tenderness.  LUNGS: Normal breath sounds bilaterally, no wheezing, rales, rhonchi. No use of accessory muscles of respiration.  CARDIOVASCULAR: S1, S2 RRR. No murmurs, rubs, gallops, clicks.  ABDOMEN: Soft, nontender, nondistended. Bowel sounds present. No organomegaly or mass.  EXTREMITIES: No pedal edema, cyanosis, or clubbing. + 2 pedal & radial pulses b/l.   NEUROLOGIC: Cranial nerves II through XII are intact. No focal Motor or sensory deficits appreciated b/l. Dense left sided weakness from previous CVA. PSYCHIATRIC: The  patient is alert and oriented x 1.  SKIN: No obvious rash, lesion, or ulcer.   Left upper extremity AV fistula with good bruit and thrill.  LABORATORY PANEL:   CBC  Recent Labs Lab 02/02/17 1422  WBC 7.9  HGB 13.2  HCT 40.0  PLT 182   ------------------------------------------------------------------------------------------------------------------  Chemistries   Recent Labs Lab 02/02/17 1422  NA 132*  K 3.8  CL 88*  CO2 33*  GLUCOSE 128*  BUN 49*  CREATININE 7.29*  CALCIUM 8.2*   ------------------------------------------------------------------------------------------------------------------  Cardiac Enzymes No results for input(s): TROPONINI in the last 168 hours. ------------------------------------------------------------------------------------------------------------------  RADIOLOGY:  Ct Head Wo Contrast  Result Date: 02/02/2017 CLINICAL DATA:  Altered mental status and generalized weakness occurred during or after dialysis. Stroke  risk factors include diabetes, hypertension, and hypercholesterolemia as well as history of previous stroke. EXAM: CT HEAD WITHOUT CONTRAST TECHNIQUE: Contiguous axial images were obtained from the base of the skull through the vertex without intravenous contrast. COMPARISON:  None. FINDINGS: Brain: No evidence for acute infarction, hemorrhage, mass lesion, hydrocephalus, or extra-axial fluid. Generalized atrophy. Hypoattenuation of white matter, likely small vessel disease. Evidence of chronic posterior circulation ischemia, with brain substance loss affecting the LEFT cerebellum, also and focal well-defined hypodensity involving the RIGHT paramedian pons. Additional focus of hypoattenuation involves the RIGHT middle cerebellar peduncle, probably chronic as well. No definite acute cortical or deep white matter infarcts. Vascular: Calcification of the cavernous internal carotid arteries, vertebral arteries, and basilar artery consistent with cerebrovascular atherosclerotic disease. No signs of intracranial large vessel occlusion. Skull: Normal. Negative for fracture or focal lesion. Sinuses/Orbits: No acute finding. Phthisis bulbi, RIGHT. LEFT cataract extraction. Other: None. IMPRESSION: Atrophy and small vessel disease. Evidence for significant remote posterior circulation ischemic change. If concern for acute infarction, and no contraindications, MRI of the brain without contrast recommended. Cerebrovascular atherosclerotic disease. No signs of intracranial large vessel occlusion. Electronically Signed   By: Staci Righter M.D.   On: 02/02/2017 15:04     IMPRESSION AND PLAN:   66 year old male with past medical history of end-stage renal disease on hemodialysis, who was history of CVA, diabetes, hypertension, hyperlipidemia who presents to the hospital after hemodialysis and noted to have altered mental status.   1. Altered mental status-etiology unclear presently. Patient had a CT head on admission which  was negative for acute pathology. Patient has spells when he stares and is nonverbal. Questionable if he has subclinical seizures. -I will get MRI of the brain, EEG, and neurology consult. Follow mental status.  2. End-stage renal disease on hemodialysis-patient gets dialysis on Tuesday Thursday and Saturday, and if patient is still here with consult nephrology. No further acute indication for hemodialysis presently.  3. History of previous CVA-continue aspirin, Plavix, atorvastatin. Next  4. History of diabetes-we'll place on sliding scale insulin. Hold Levemir for now.  5. Essential hypertension-continue lisinopril, metoprolol, metolazone.  6. Consider hyperparathyroidism-continue Renvela. Next  7. Depression-continue Zoloft.  All the records are reviewed and case discussed with ED provider. Management plans discussed with the patient, family and they are in agreement.  CODE STATUS: Full  TOTAL TIME TAKING CARE OF THIS PATIENT: 45 minutes.    Henreitta Leber M.D on 02/02/2017 at 4:37 PM  Between 7am to 6pm - Pager - [phone]  After 6pm go to [email or web address] - password [email or web address] [facility]  Office  [phone]  CC: Primary care physician; Juluis Pitch, MD

## 2017-02-02 NOTE — ED Provider Notes (Signed)
[facility] Emergency Department Provider Note  ____________________________________________  Time seen: Approximately 2:27 PM  I have reviewed the triage vital signs and the nursing notes.   HISTORY  Chief Complaint Altered Mental Status and Weakness  Level 5 caveat:  Portions of the history and physical were unable to be obtained due to AMS   HPI Dennis Walker is a 66 y.o. male with h/o ESRD on HD, CVA on plavix, hypertension, hyperlipidemia who presents from dialysis for an episode of altered mental status. Patient received a full treatment and just prior to being discharged from [facility] patient became confused, not following commands, not answering to any questions. Patient arrives confused, follows some commands only. Not answering questions, starring into space.  Spoke with patient's nurse at [facility] with tells me that this is not patient's baseline. He is usually with that and tells jokes. He can be slightly confused but usually able to follow commands and have a normal conversation. According to her he was normal when he left for dialysis.  Past Medical History:  Diagnosis Date  . Chronic kidney disease   . Diabetes mellitus without complication ([place])   . Hyperlipidemia   . Hypertension   . Stroke [facility])     Patient Active Problem List   Diagnosis Date Noted  . Atherosclerosis of native arteries of the extremities with ulceration ([place]) 12/20/2016  . 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 Diabetes ([place]) 12/20/2016  . Essential hypertension 12/20/2016  . Pure hypercholesterolemia 12/20/2016    Past Surgical History:  Procedure Laterality Date  . A/V FISTULAGRAM Left 01/31/2017   Procedure: A/V Fistulagram;  Surgeon: Katha Cabal, MD;  Location: [facility] [facility];  Service: Cardiovascular;  Laterality: Left;    Prior to Admission medications   Medication Sig Start Date End Date Taking? Authorizing  Provider  aspirin EC 81 MG tablet Take 81 mg by mouth daily at 6 PM. (1800)    [provider]  atorvastatin (LIPITOR) 40 MG tablet Take 40 mg by mouth daily at 6 PM. (1800)    [provider]  b complex vitamins capsule Take 1 capsule by mouth daily. (1800)    [provider]  bumetanide (BUMEX) 2 MG tablet Take 2 mg by mouth 2 (two) times daily. (0600 & 1800)    [provider]  clopidogrel (PLAVIX) 75 MG tablet Take 75 mg by mouth daily at 6 PM. (1800)    [provider]  ferrous sulfate 325 (65 FE) MG tablet Take 325 mg by mouth 2 (two) times daily. (0600 &1800)    [provider]  gabapentin (NEURONTIN) 100 MG capsule Take 100 mg by mouth 2 (two) times daily. (0600 & 1800)    [provider]  gabapentin (NEURONTIN) 100 MG capsule Take 400 mg by mouth daily as needed (for pain.).    [provider]  Insulin Detemir (LEVEMIR FLEXPEN) 100 UNIT/ML Pen Inject 24 Units into the skin daily at 10 pm. (2100)    [provider]  lactulose (CHRONULAC) 10 GM/15ML solution Take 20 g by mouth at bedtime. (2100)    [provider]  lidocaine-prilocaine (EMLA) cream Apply 1 application topically Every Tuesday,Thursday,and Saturday with dialysis. (0500)    [provider]  linagliptin (TRADJENTA) 5 MG TABS tablet Take 2.5 mg by mouth daily at 6 (six) AM. (0630)    [provider]  lisinopril (PRINIVIL,ZESTRIL) 5 MG tablet Take 5 mg  by mouth daily. (0600)    [provider]  Melatonin 3 MG TABS Take 3 mg by mouth daily at 10 pm. (2100)    [provider]  metolazone (ZAROXOLYN) 5 MG tablet Take 5 mg by mouth daily. (0600)    [provider]  metoprolol succinate (TOPROL-XL) 100 MG 24 hr tablet Take 100 mg by mouth daily. (1800) Take with or immediately following a meal.    [provider]  nitroGLYCERIN (NITROSTAT) 0.4 MG SL tablet Place 0.4 mg under the tongue every 5  (five) minutes as needed for chest pain.    [provider]  Nutritional Supplements (FEEDING SUPPLEMENT, NEPRO CARB STEADY,) LIQD Take 237 mLs by mouth 2 (two) times daily. (0900 & 1800)    [provider]  oxyCODONE-acetaminophen (PERCOCET/ROXICET) 5-325 MG tablet Take 1 tablet by mouth every 6 (six) hours as needed (for pain.).     [provider]  polyethylene glycol (MIRALAX / GLYCOLAX) packet Take 17 g by mouth daily. (1800)    [provider]  ranolazine (RANEXA) 500 MG 12 hr tablet Take 500 mg by mouth 2 (two) times daily. (0600 &1800)    [provider]  senna (SENOKOT) 8.6 MG TABS tablet Take 8.6 mg by mouth 2 (two) times daily. (0600 & 1800)    [provider]  sertraline (ZOLOFT) 100 MG tablet Take 100 mg by mouth daily. (2100)    [provider]  sevelamer carbonate (RENVELA) 800 MG tablet Take 1,600 mg by mouth 3 (three) times daily with meals. (1230, 1800, 2100)    [provider]  Skin Protectants, Misc. (EUCERIN) cream Apply 1 application topically 2 (two) times daily as needed (for pruritus).    [provider]    Allergies Cefuroxime; Shrimp [shellfish allergy]; and Sulfa antibiotics  History reviewed. No pertinent family history.  Social History Social History  Substance Use Topics  . Smoking status: Never Smoker  . Smokeless tobacco: Never Used  . Alcohol use No    Review of Systems Level 5 caveat:  Portions of the history and physical were unable to be obtained due to AMS  + AMS  ____________________________________________   PHYSICAL EXAM:  VITAL SIGNS: Vitals:   02/02/17 1430  BP: 121/69  Pulse: 63  Resp: 13  Temp: 98.2 F (36.8 C)   Constitutional: Awake staring, mumbling, no distress .  HEENT:      Head: Normocephalic and atraumatic.         Eyes: Conjunctivae are normal. Sclera is non-icteric.       Mouth/Throat: Mucous membranes are moist.       Neck: Supple  with no signs of meningismus. Cardiovascular: Regular rate and rhythm. No murmurs, gallops, or rubs. 2+ symmetrical distal pulses are present in all extremities. No JVD. Respiratory: Normal respiratory effort. Lungs are clear to auscultation bilaterally. No wheezes, crackles, or rhonchi.  Gastrointestinal: Soft, non tender, and non distended with positive bowel sounds. No rebound or guarding. Musculoskeletal: R prostheses, no edema on the left Neurologic: Left-sided hemiparesis which is chronic, mumbling, confused, follow certain commands but not all, not answering to questions  Skin: Skin is warm, dry and intact. No rash noted. ____________________________________________   LABS (all labs ordered are listed, but only abnormal results are displayed)  Labs Reviewed  BASIC METABOLIC PANEL - Abnormal; Notable for the following:       Result Value   Sodium 132 (*)    Chloride 88 (*)  CO2 33 (*)    Glucose, Bld 128 (*)    BUN 49 (*)    Creatinine, Ser 7.29 (*)    Calcium 8.2 (*)    GFR calc non Af Amer 7 (*)    GFR calc Af Amer 8 (*)    All other components within normal limits  CBC - Abnormal; Notable for the following:    RBC 4.35 (*)    RDW 16.7 (*)    All other components within normal limits  GLUCOSE, CAPILLARY - Abnormal; Notable for the following:    Glucose-Capillary 134 (*)    All other components within normal limits  URINALYSIS, COMPLETE (UACMP) WITH MICROSCOPIC  CBG MONITORING, ED   ____________________________________________  EKG  ED ECG REPORT I, Rudene Re, the attending physician, personally viewed and interpreted this ECG.  Normal sinus rhythm, first-degree AV block, normal QRS and QTC intervals, normal axis, no ST elevations or depressions. T-wave inversion in 1 and aVL. No prior for comparison. ____________________________________________  RADIOLOGY  Head CT: Atrophy and small vessel disease. Evidence for significant remote posterior circulation  ischemic change. If concern for acute infarction, and no contraindications, MRI of the brain without contrast recommended.  Cerebrovascular atherosclerotic disease. No signs of intracranial large vessel occlusion. ____________________________________________   PROCEDURES  Procedure(s) performed: None Procedures Critical Care performed:  None ____________________________________________   INITIAL IMPRESSION / ASSESSMENT AND PLAN / ED COURSE  66 y.o. male with h/o ESRD on HD, CVA on plavix, hypertension, hyperlipidemia who presents from dialysis for an episode of altered mental status. Patient has left-sided hemiparesis which is his baseline. Vitals are within normal limits with no suspicion for infection. The patient is staring, follow certain commands but not obvious, very confused and mumbling which is very different and patient's baseline. Will do head CT, check basic blood work, EKG, monitor patient closely.  _________________________ 3:52 PM on 02/02/2017 -----------------------------------------  Labs and head CT negative. Went back to evaluate patient. He started answering questions and having a normal conversation and then all of a sudden he stops and starts staring at the wall, he then starts mumbling, and is unable to answer questions. No seizure like activity noted but I am concerned for possible subclinical seizures therefore I discussed with the hospitalist who will admit patient to     Pertinent labs & imaging results that were available during my care of the patient were reviewed by me and considered in my medical decision making (see chart for details).    ____________________________________________   FINAL CLINICAL IMPRESSION(S) / ED DIAGNOSES  Final diagnoses:  Altered mental status, unspecified altered mental status type      NEW MEDICATIONS STARTED DURING THIS VISIT:  New Prescriptions   No medications on file     Note:  This document was  prepared using Dragon voice recognition software and may include unintentional dictation errors.    Alfred Levins, [place], MD 02/02/17 [phone]

## 2017-02-02 NOTE — ED Notes (Signed)
NAD noted at this time. Pt resting in bed. Pt denies any needs at this time. Pt adjusted himself in bed at this time. Will continue to monitor for further patient needs.

## 2017-02-02 NOTE — ED Notes (Signed)
No change in patient condition. Pt is resting in bed, VSS and WNL at this time. Will continue to monitor for further patient needs. Awaiting ready bed for patient admission.

## 2017-02-02 NOTE — ED Notes (Signed)
Admitting MD at bedside at this time. No change in patient condition. Will continue to monitor for further patient needs.

## 2017-02-03 ENCOUNTER — Observation Stay: Payer: Medicare Other

## 2017-02-03 DIAGNOSIS — R4182 Altered mental status, unspecified: Secondary | ICD-10-CM

## 2017-02-03 DIAGNOSIS — R404 Transient alteration of awareness: Secondary | ICD-10-CM | POA: Diagnosis not present

## 2017-02-03 LAB — CBC
HEMATOCRIT: 39.6 % — AB (ref 40.0–52.0)
Hemoglobin: 13.2 g/dL (ref 13.0–18.0)
MCH: 30.8 pg (ref 26.0–34.0)
MCHC: 33.2 g/dL (ref 32.0–36.0)
MCV: 92.8 fL (ref 80.0–100.0)
Platelets: 167 10*3/uL (ref 150–440)
RBC: 4.27 MIL/uL — ABNORMAL LOW (ref 4.40–5.90)
RDW: 16.5 % — AB (ref 11.5–14.5)
WBC: 7.4 10*3/uL (ref 3.8–10.6)

## 2017-02-03 LAB — URINALYSIS, COMPLETE (UACMP) WITH MICROSCOPIC
BACTERIA UA: NONE SEEN
BILIRUBIN URINE: NEGATIVE
Glucose, UA: NEGATIVE mg/dL
Hgb urine dipstick: NEGATIVE
KETONES UR: NEGATIVE mg/dL
Leukocytes, UA: NEGATIVE
Nitrite: NEGATIVE
PH: 5 (ref 5.0–8.0)
Protein, ur: 100 mg/dL — AB
Specific Gravity, Urine: 1.016 (ref 1.005–1.030)

## 2017-02-03 LAB — MRSA PCR SCREENING: MRSA by PCR: NEGATIVE

## 2017-02-03 LAB — BASIC METABOLIC PANEL
Anion gap: 14 (ref 5–15)
BUN: 58 mg/dL — AB (ref 6–20)
CHLORIDE: 90 mmol/L — AB (ref 101–111)
CO2: 29 mmol/L (ref 22–32)
Calcium: 8.1 mg/dL — ABNORMAL LOW (ref 8.9–10.3)
Creatinine, Ser: 8.81 mg/dL — ABNORMAL HIGH (ref 0.61–1.24)
GFR calc Af Amer: 6 mL/min — ABNORMAL LOW (ref >60)
GFR calc non Af Amer: 6 mL/min — ABNORMAL LOW (ref >60)
GLUCOSE: 120 mg/dL — AB (ref 65–99)
POTASSIUM: 4.2 mmol/L (ref 3.5–5.1)
Sodium: 133 mmol/L — ABNORMAL LOW (ref 135–145)

## 2017-02-03 LAB — GLUCOSE, CAPILLARY
GLUCOSE-CAPILLARY: 195 mg/dL — AB (ref 65–99)
Glucose-Capillary: 184 mg/dL — ABNORMAL HIGH (ref 65–99)

## 2017-02-03 MED ORDER — INSULIN ASPART 100 UNIT/ML ~~LOC~~ SOLN
0.0000 [IU] | Freq: Three times a day (TID) | SUBCUTANEOUS | Status: DC
  Administered 2017-02-03 – 2017-02-04 (×4): 2 [IU] via SUBCUTANEOUS
  Administered 2017-02-05: 3 [IU] via SUBCUTANEOUS
  Administered 2017-02-05: 2 [IU] via SUBCUTANEOUS
  Filled 2017-02-03 (×6): qty 1

## 2017-02-03 NOTE — Progress Notes (Signed)
[facility] [place] NAME: Dennis Walker    MR#:  [id number]  DATE OF BIRTH:  [birth year]  SUBJECTIVE:   Came with AMS after HD y'day Pt alert and oriented x3 No seizures noted REVIEW OF SYSTEMS:   Review of Systems  Constitutional: Negative for chills, fever and weight loss.  HENT: Negative for ear discharge, ear pain and nosebleeds.   Eyes: Negative for blurred vision, pain and discharge.  Respiratory: Negative for sputum production, shortness of breath, wheezing and stridor.   Cardiovascular: Negative for chest pain, palpitations, orthopnea and PND.  Gastrointestinal: Negative for abdominal pain, diarrhea, nausea and vomiting.  Genitourinary: Negative for frequency and urgency.  Musculoskeletal: Negative for back pain and joint pain.  Neurological: Positive for weakness. Negative for sensory change, speech change and focal weakness.  Psychiatric/Behavioral: Negative for depression and hallucinations. The patient is not nervous/anxious.    Tolerating Diet:yes  DRUG ALLERGIES:   Allergies  Allergen Reactions  . Cefuroxime Itching  . Shrimp [Shellfish Allergy] Other (See Comments)    Per mar   . Sulfa Antibiotics Other (See Comments)    Per mar    VITALS:  Blood pressure 130/65, pulse 65, temperature 97.9 F (36.6 C), temperature source Oral, resp. rate (!) 8, height 5' 9.5" (1.765 m), weight 86.6 kg (190 lb 14.4 oz), SpO2 99 %.  PHYSICAL EXAMINATION:   Physical Exam  GENERAL:  66 y.o.-year-old patient lying in the bed with no acute distress.  EYES: Pupils equal, round, reactive to light and accommodation. No scleral icterus. Extraocular muscles intact.  HEENT: Head atraumatic, normocephalic. Oropharynx and nasopharynx clear.  NECK:  Supple, no jugular venous distention. No thyroid enlargement, no tenderness.  LUNGS: Normal breath sounds bilaterally, no wheezing, rales, rhonchi. No use of accessory muscles of  respiration.  CARDIOVASCULAR: S1, S2 normal. No murmurs, rubs, or gallops.  ABDOMEN: Soft, nontender, nondistended. Bowel sounds present. No organomegaly or mass.  EXTREMITIES: No cyanosis, clubbing or edema b/l. S/p BKA ,chronic    NEUROLOGIC: Cranial nerves II through XII are intact. No focal Motor or sensory deficits b/l.   PSYCHIATRIC:  patient is alert and oriented x 3.  SKIN: No obvious rash, lesion, or ulcer.   LABORATORY PANEL:  CBC  Recent Labs Lab 02/03/17 0320  WBC 7.4  HGB 13.2  HCT 39.6*  PLT 167    Chemistries   Recent Labs Lab 02/03/17 0320  NA 133*  K 4.2  CL 90*  CO2 29  GLUCOSE 120*  BUN 58*  CREATININE 8.81*  CALCIUM 8.1*   Cardiac Enzymes No results for input(s): TROPONINI in the last 168 hours. RADIOLOGY:  Ct Head Wo Contrast  Result Date: 02/02/2017 CLINICAL DATA:  Altered mental status and generalized weakness occurred during or after dialysis. Stroke risk factors include diabetes, hypertension, and hypercholesterolemia as well as history of previous stroke. EXAM: CT HEAD WITHOUT CONTRAST TECHNIQUE: Contiguous axial images were obtained from the base of the skull through the vertex without intravenous contrast. COMPARISON:  None. FINDINGS: Brain: No evidence for acute infarction, hemorrhage, mass lesion, hydrocephalus, or extra-axial fluid. Generalized atrophy. Hypoattenuation of white matter, likely small vessel disease. Evidence of chronic posterior circulation ischemia, with brain substance loss affecting the LEFT cerebellum, also and focal well-defined hypodensity involving the RIGHT paramedian pons. Additional focus of hypoattenuation involves the RIGHT middle cerebellar peduncle, probably chronic as well. No definite acute cortical or deep white matter infarcts. Vascular: Calcification of the cavernous internal  carotid arteries, vertebral arteries, and basilar artery consistent with cerebrovascular atherosclerotic disease. No signs of intracranial  large vessel occlusion. Skull: Normal. Negative for fracture or focal lesion. Sinuses/Orbits: No acute finding. Phthisis bulbi, RIGHT. LEFT cataract extraction. Other: None. IMPRESSION: Atrophy and small vessel disease. Evidence for significant remote posterior circulation ischemic change. If concern for acute infarction, and no contraindications, MRI of the brain without contrast recommended. Cerebrovascular atherosclerotic disease. No signs of intracranial large vessel occlusion. Electronically Signed   By: Staci Righter M.D.   On: 02/02/2017 15:04   Mr Brain Wo Contrast  Result Date: 02/03/2017 CLINICAL DATA:  Altered mental status during dialysis. EXAM: MRI HEAD WITHOUT CONTRAST TECHNIQUE: Multiplanar, multiecho pulse sequences of the brain and surrounding structures were obtained without intravenous contrast. COMPARISON:  CT 02/02/2017 FINDINGS: Brain: Diffusion imaging does not show any acute or subacute infarction. Extensive old small vessel infarctions throughout the brainstem. Extensive old cerebellar infarctions left more than right. Old small vessel infarctions affecting the thalami, basal ganglia and hemispheric deep white matter. Old right frontal infarction or gliosis secondary to previous ventriculostomy. No sign of mass lesion, recent hemorrhage, hydrocephalus or extra-axial collection. Vascular: Major vessels at the base of the brain show flow. Skull and upper cervical spine: Negative Sinuses/Orbits: Sinuses clear. 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 Small amount of mastoid fluid on the left. Other: None significant IMPRESSION: No acute finding by MRI. Extensive old ischemic changes throughout the brain, particularly affecting the brainstem and cerebellum, as outlined above. Electronically Signed   By: Nelson Chimes M.D.   On: 02/03/2017 10:50   ASSESSMENT AND PLAN:   66 year old male with past medical history of end-stage renal disease on hemodialysis, who was history of CVA, diabetes,  hypertension, hyperlipidemia who presents to the hospital after hemodialysis and noted to have altered mental status.   1. Altered mental status-etiology unclear presently. Patient had a CT head on admission which was negative for acute pathology. Patient has spells when he stares and is nonverbal. Questionable if he has subclinical seizures. - MRI of the brain is negative - EEG no epileptiform activity noted - neurology consult pending - pt's mental status appears at baseline.  2. End-stage renal disease on hemodialysis-patient gets dialysis on Tuesday Thursday and Saturday. - No further acute indication for hemodialysis presently. -nephrology consulted  3. History of previous CVA-continue aspirin, Plavix, atorvastatin.   4. History of diabetes-we'll place on sliding scale insulin. Hold Levemir for now.  5. Essential hypertension-continue lisinopril, metoprolol, metolazone.  6. Consider hyperparathyroidism-continue Renvela. Next  7. Depression-continue Zoloft.  Return to [facility] tomorrow if remains stable  Case discussed with Care Management/Social Worker. Management plans discussed with the patient, family and they are in agreement.  CODE STATUS: full  DVT Prophylaxis: heparin  TOTAL TIME TAKING CARE OF THIS PATIENT: 30 minutes.  >50% time spent on counselling and coordination of care  POSSIBLE D/C IN 1 DAYS, DEPENDING ON CLINICAL CONDITION.  Note: This dictation was prepared with Dragon dictation along with smaller phrase technology. Any transcriptional errors that result from this process are unintentional.  Laela Deviney M.D on 02/03/2017 at 4:54 PM  Between 7am to 6pm - Pager - [phone]  After 6pm go to [email or web address] - password [email or web address] [facility]  Office  [phone]  CC: Primary care physician; Juluis Pitch, MD

## 2017-02-03 NOTE — Care Management Obs Status (Signed)
[place] NOTIFICATION   Patient Details  Name: Dennis Walker MRN: [id number] Date of Birth: 07-07-1950   Medicare Observation Status Notification Given:  No (patient off the floor for EEG)    Beverly Sessions, RN 02/03/2017, 3:21 PM

## 2017-02-03 NOTE — NC FL2 (Signed)
[place] LEVEL OF CARE SCREENING TOOL     IDENTIFICATION  Patient Name: Dennis Walker Birthdate: 03-Feb-1951 Sex: male Admission Date (Current Location): 02/02/2017  [place] and [place] Number:  Engineering geologist and Address:  [facility], [address] [place],  [address]      Provider Number: [id number]  Attending Physician Name and Address:  Fritzi Mandes, MD  Relative Name and Phone Number:       Current Level of Care: Hospital Recommended Level of Care: [place] Prior Approval Number:    Date Approved/Denied:   PASRR Number: [id number]  Discharge Plan: SNF    Current Diagnoses: Patient Active Problem List   Diagnosis Date Noted  . Altered mental status 02/02/2017  . Atherosclerosis of native arteries of the extremities with ulceration ([place]) 12/20/2016  . 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 Diabetes ([place]) 12/20/2016  . Essential hypertension 12/20/2016  . Pure hypercholesterolemia 12/20/2016    Orientation RESPIRATION BLADDER Height & Weight     Self, Time, Situation, Place  Normal Continent Weight: 190 lb 14.4 oz (86.6 kg) Height:  5' 9.5" (176.5 cm)  BEHAVIORAL SYMPTOMS/MOOD NEUROLOGICAL BOWEL NUTRITION STATUS   (none)  (being ruled out) Continent Diet  AMBULATORY STATUS COMMUNICATION OF NEEDS Skin   Limited Assist Verbally Normal                       Personal Care Assistance Level of Assistance  Bathing, Dressing Bathing Assistance: Limited assistance   Dressing Assistance: Limited assistance     Functional Limitations Info   (none)          SPECIAL CARE FACTORS FREQUENCY                       Contractures Contractures Info: Not present    Additional Factors Info  Code Status Code Status Info: full             Current Medications (02/03/2017):  This is the current hospital active medication list Current Facility-Administered  Medications  Medication Dose Route Frequency Provider Last Rate Last Dose  . acetaminophen (TYLENOL) tablet 650 mg  650 mg Oral Q6H PRN Henreitta Leber, MD       Or  . acetaminophen (TYLENOL) suppository 650 mg  650 mg Rectal Q6H PRN Henreitta Leber, MD      . aspirin EC tablet 81 mg  81 mg Oral q1800 Sainani, Belia Heman, MD      . atorvastatin (LIPITOR) tablet 40 mg  40 mg Oral q1800 Henreitta Leber, MD      . bumetanide (BUMEX) tablet 2 mg  2 mg Oral BID Henreitta Leber, MD   2 mg at 02/03/17 6503  . clopidogrel (PLAVIX) tablet 75 mg  75 mg Oral q1800 Sainani, Belia Heman, MD      . feeding supplement (NEPRO CARB STEADY) liquid 237 mL  237 mL Oral BID Henreitta Leber, MD   237 mL at 02/03/17 1000  . ferrous sulfate tablet 325 mg  325 mg Oral BID Henreitta Leber, MD   325 mg at 02/03/17 5465  . gabapentin (NEURONTIN) capsule 100 mg  100 mg Oral BID Henreitta Leber, MD   100 mg at 02/03/17 0624  . heparin injection 5,000 Units  5,000 Units Subcutaneous Q8H Henreitta Leber, MD   5,000 Units at 02/03/17 1415  . linagliptin (  TRADJENTA) tablet 2.5 mg  2.5 mg Oral Q0600 Henreitta Leber, MD   2.5 mg at 02/03/17 5366  . lisinopril (PRINIVIL,ZESTRIL) tablet 5 mg  5 mg Oral Daily Henreitta Leber, MD   5 mg at 02/03/17 4403  . Melatonin TABS 5 mg  5 mg Oral QHS Henreitta Leber, MD   5 mg at 02/02/17 2253  . metolazone (ZAROXOLYN) tablet 5 mg  5 mg Oral Daily Henreitta Leber, MD   5 mg at 02/03/17 4742  . metoprolol succinate (TOPROL-XL) 24 hr tablet 100 mg  100 mg Oral Daily Henreitta Leber, MD   100 mg at 02/03/17 1118  . nitroGLYCERIN (NITROSTAT) SL tablet 0.4 mg  0.4 mg Sublingual Q5 min PRN Sainani, Belia Heman, MD      . ondansetron (ZOFRAN) tablet 4 mg  4 mg Oral Q6H PRN Henreitta Leber, MD       Or  . ondansetron (ZOFRAN) injection 4 mg  4 mg Intravenous Q6H PRN Sainani, Belia Heman, MD      . oxyCODONE-acetaminophen (PERCOCET/ROXICET) 5-325 MG per tablet 1 tablet  1 tablet Oral Q6H PRN Henreitta Leber, MD      . polyethylene glycol (MIRALAX / GLYCOLAX) packet 17 g  17 g Oral Daily Henreitta Leber, MD   17 g at 02/03/17 1119  . ranolazine (RANEXA) 12 hr tablet 500 mg  500 mg Oral BID Henreitta Leber, MD   500 mg at 02/03/17 1119  . senna (SENOKOT) tablet 8.6 mg  8.6 mg Oral BID Henreitta Leber, MD   8.6 mg at 02/03/17 1118  . sertraline (ZOLOFT) tablet 100 mg  100 mg Oral Daily Henreitta Leber, MD   100 mg at 02/03/17 1118  . sevelamer carbonate (RENVELA) tablet 1,600 mg  1,600 mg Oral TID WC Henreitta Leber, MD   1,600 mg at 02/03/17 1311     Discharge Medications: Please see discharge summary for a list of discharge medications.  Relevant Imaging Results:  Relevant Lab Results:   Additional Information ss: [id number]  Shela Leff, LCSW

## 2017-02-03 NOTE — Consult Note (Addendum)
Reason for Consult:Episode of unresponsiveness Referring Physician: Posey Pronto  CC: Episode of unresponsiveness  HPI: Dennis Walker is an 66 y.o. male with ESRD on dialysis who was at dialysis on yesterday.  He finished his treatment and was about to be discharged when he was noted to become confused and be unable to follow commands answer questions.  Was starring into space.   Patient was brought to the ED.  Reports that he remembers people trying to talk to him but there was nothing he could do.  Patient reports having a similar episode after dialysis about a year ago. Patient on Plavix and ASA.  Past Medical History:  Diagnosis Date  . Chronic kidney disease   . Diabetes mellitus without complication ([place])   . Hyperlipidemia   . Hypertension   . Stroke [facility])     Past Surgical History:  Procedure Laterality Date  . A/V FISTULAGRAM Left 01/31/2017   Procedure: A/V Fistulagram;  Surgeon: Katha Cabal, MD;  Location: [facility] [facility];  Service: Cardiovascular;  Laterality: Left;    Family History  Problem Relation Age of Onset  . Diabetes Neg Hx   . Cancer Neg Hx   . Hypertension Neg Hx     Social History:  reports that he has never smoked. He has never used smokeless tobacco. He reports that he does not drink alcohol or use drugs.  Allergies  Allergen Reactions  . Cefuroxime Itching  . Shrimp [Shellfish Allergy] Other (See Comments)    Per mar   . Sulfa Antibiotics Other (See Comments)    Per mar    Medications:  I have reviewed the patient's current medications. Prior to Admission:  Prescriptions Prior to Admission  Medication Sig Dispense Refill Last Dose  . aspirin EC 81 MG tablet Take 81 mg by mouth daily at 6 PM. (1800)   02/01/2017 at 1800  . atorvastatin (LIPITOR) 40 MG tablet Take 40 mg by mouth daily at 6 PM. (1800)   02/01/2017 at 1800  . b complex vitamins capsule Take 1 capsule by mouth daily. (1800)   02/01/2017 at 1800  . bumetanide (BUMEX) 2 MG tablet  Take 2 mg by mouth 2 (two) times daily. (0600 & 1800)   02/02/2017 at 0600  . clopidogrel (PLAVIX) 75 MG tablet Take 75 mg by mouth daily at 6 PM. (1800)   02/01/2017 at 1800  . ferrous sulfate 325 (65 FE) MG tablet Take 325 mg by mouth 2 (two) times daily. (0600 &1800)   02/02/2017 at 0600  . gabapentin (NEURONTIN) 100 MG capsule Take 100 mg by mouth 2 (two) times daily. (0600 & 1800)   02/02/2017 at 0600  . gabapentin (NEURONTIN) 100 MG capsule Take 400 mg by mouth daily as needed (for pain.).   prn at prn  . Insulin Detemir (LEVEMIR FLEXPEN) 100 UNIT/ML Pen Inject 24 Units into the skin daily at 10 pm. (2100)   02/01/2017 at 2100  . lactulose (CHRONULAC) 10 GM/15ML solution Take 20 g by mouth at bedtime. (2100)   02/01/2017 at 2100  . lidocaine-prilocaine (EMLA) cream Apply 1 application topically Every Tuesday,Thursday,and Saturday with dialysis. (0500)   prn at prn  . linagliptin (TRADJENTA) 5 MG TABS tablet Take 2.5 mg by mouth daily at 6 (six) AM. (0630)   02/02/2017 at 0630  . lisinopril (PRINIVIL,ZESTRIL) 5 MG tablet Take 5 mg by mouth daily. (0600)   02/02/2017 at 0600  . Melatonin 3 MG TABS Take 3 mg by mouth  daily at 10 pm. (2100)   02/01/2017 at 2100  . metolazone (ZAROXOLYN) 5 MG tablet Take 5 mg by mouth daily. (0600)   02/02/2017 at 0600  . metoprolol succinate (TOPROL-XL) 100 MG 24 hr tablet Take 100 mg by mouth daily. (1800) Take with or immediately following a meal.   02/01/2017 at 1800  . nitroGLYCERIN (NITROSTAT) 0.4 MG SL tablet Place 0.4 mg under the tongue every 5 (five) minutes as needed for chest pain.   prn at prn  . oxyCODONE-acetaminophen (PERCOCET/ROXICET) 5-325 MG tablet Take 1 tablet by mouth every 6 (six) hours as needed (for pain.).    prn at prn  . polyethylene glycol (MIRALAX / GLYCOLAX) packet Take 17 g by mouth daily. (1800)   02/01/2017 at 1800  . ranolazine (RANEXA) 500 MG 12 hr tablet Take 500 mg by mouth 2 (two) times daily. (0600 &1800)   02/02/2017 at 0600  . senna (SENOKOT) 8.6 MG  TABS tablet Take 8.6 mg by mouth 2 (two) times daily. (0600 & 1800)   02/02/2017 at 0600  . sertraline (ZOLOFT) 100 MG tablet Take 100 mg by mouth daily. (2100)   02/01/2017 at 2100  . sevelamer carbonate (RENVELA) 800 MG tablet Take 1,600 mg by mouth 3 (three) times daily with meals. (1230, 1800, 2100)   02/02/2017 at 1230  . Skin Protectants, Misc. (EUCERIN) cream Apply 1 application topically 2 (two) times daily as needed (for pruritus).   prn at prn  . Nutritional Supplements (FEEDING SUPPLEMENT, NEPRO CARB STEADY,) LIQD Take 237 mLs by mouth 2 (two) times daily. (0900 & 1800)   01/30/2017 at Unknown time   Scheduled: . aspirin EC  81 mg Oral q1800  . atorvastatin  40 mg Oral q1800  . bumetanide  2 mg Oral BID  . clopidogrel  75 mg Oral q1800  . feeding supplement (NEPRO CARB STEADY)  237 mL Oral BID  . ferrous sulfate  325 mg Oral BID  . gabapentin  100 mg Oral BID  . heparin  5,000 Units Subcutaneous Q8H  . insulin aspart  0-9 Units Subcutaneous TID WC  . linagliptin  2.5 mg Oral Q0600  . lisinopril  5 mg Oral Daily  . Melatonin  5 mg Oral QHS  . metolazone  5 mg Oral Daily  . metoprolol succinate  100 mg Oral Daily  . polyethylene glycol  17 g Oral Daily  . ranolazine  500 mg Oral BID  . senna  8.6 mg Oral BID  . sertraline  100 mg Oral Daily  . sevelamer carbonate  1,600 mg Oral TID WC    ROS: History obtained from the patient  General ROS: negative for - chills, fatigue, fever, night sweats, weight gain or weight loss Psychological ROS: memory difficulties Ophthalmic ROS: negative for - blurry vision, double vision, eye pain or loss of vision ENT ROS: negative for - epistaxis, nasal discharge, oral lesions, sore throat, tinnitus or vertigo Allergy and Immunology ROS: negative for - hives or itchy/watery eyes Hematological and Lymphatic ROS: negative for - bleeding problems, bruising or swollen lymph nodes Endocrine ROS: negative for - galactorrhea, hair pattern changes,  polydipsia/polyuria or temperature intolerance Respiratory ROS: negative for - cough, hemoptysis, shortness of breath or wheezing Cardiovascular ROS: negative for - chest pain, dyspnea on exertion, edema or irregular heartbeat Gastrointestinal ROS: negative for - abdominal pain, diarrhea, hematemesis, nausea/vomiting or stool incontinence Genito-Urinary ROS: negative for - dysuria, hematuria, incontinence or urinary frequency/urgency Musculoskeletal ROS: left leg coldness  and loss of feeling Neurological ROS: as noted in HPI Dermatological ROS: negative for rash and skin lesion changes  Physical Examination: Blood pressure 130/65, pulse 65, temperature 97.9 F (36.6 C), temperature source Oral, resp. rate (!) 8, height 5' 9.5" (1.765 m), weight 86.6 kg (190 lb 14.4 oz), SpO2 99 %.  HEENT-  Normocephalic, no lesions, without obvious abnormality.  Normal external eye and conjunctiva.  Normal TM's bilaterally.  Normal auditory canals and external ears. Normal external nose, mucus membranes and septum.  Normal pharynx. Cardiovascular- S1, S2 normal, pulses palpable throughout   Lungs- chest clear, no wheezing, rales, normal symmetric air entry Abdomen- soft, non-tender; bowel sounds normal; no masses,  no organomegaly Extremities- left leg cold Lymph-no adenopathy palpable Musculoskeletal-Right BKA Skin-warm and dry, no hyperpigmentation, vitiligo, or suspicious lesions  Neurological Examination   Mental Status: Alert, oriented, thought content appropriate.  Speech fluent without evidence of aphasia.  Able to follow 3 step commands without difficulty. Cranial Nerves: II: Discs flat on left; Visual fields grossly normal in left eye, left pupil round, reactive to light III,IV, VI: right eye closed, extra-ocular motions intact bilaterally V,VII: smile symmetric, facial light touch sensation normal bilaterally VIII: hearing normal bilaterally IX,X: gag reflex present XI: bilateral shoulder  shrug XII: midline tongue extension Motor: Right : Upper extremity   5/5    Left:     Upper extremity   1-2/5  Lower extremity   BKA    Lower extremity   5/5 Increased tone in the LUE Sensory: Pinprick and light touch decreased in the LLE Deep Tendon Reflexes: 1+ in the upper extremities, absent in the LLE Plantars: Right: BKA   Left: mute Cerebellar: Normal finger-to-nose testing bilaterally Gait: not tested due to safety concerns   Laboratory Studies:   Basic Metabolic Panel:  Recent Labs Lab 02/02/17 1422 02/03/17 0320  NA 132* 133*  K 3.8 4.2  CL 88* 90*  CO2 33* 29  GLUCOSE 128* 120*  BUN 49* 58*  CREATININE 7.29* 8.81*  CALCIUM 8.2* 8.1*    Liver Function Tests: No results for input(s): AST, ALT, ALKPHOS, BILITOT, PROT, ALBUMIN in the last 168 hours. No results for input(s): LIPASE, AMYLASE in the last 168 hours. No results for input(s): AMMONIA in the last 168 hours.  CBC:  Recent Labs Lab 02/02/17 1422 02/03/17 0320  WBC 7.9 7.4  HGB 13.2 13.2  HCT 40.0 39.6*  MCV 92.1 92.8  PLT 182 167    Cardiac Enzymes: No results for input(s): CKTOTAL, CKMB, CKMBINDEX, TROPONINI in the last 168 hours.  BNP: Invalid input(s): POCBNP  CBG:  Recent Labs Lab 01/31/17 1138 01/31/17 1225 01/31/17 1501 02/02/17 1424 02/03/17 1708  GLUCAP 84 134* 148* 134* 195*    Microbiology: Results for orders placed or performed during the hospital encounter of 02/02/17  MRSA PCR Screening     Status: None   Collection Time: 02/03/17 12:53 PM  Result Value Ref Range Status   MRSA by PCR NEGATIVE NEGATIVE Final    Comment:        The GeneXpert MRSA Assay (FDA approved for NASAL specimens only), is one component of a comprehensive MRSA colonization surveillance program. It is not intended to diagnose MRSA infection nor to guide or monitor treatment for MRSA infections.     Coagulation Studies: No results for input(s): LABPROT, INR in the last 72  hours.  Urinalysis:  Recent Labs Lab 02/02/17 1422  COLORURINE AMBER*  LABSPEC 1.016  PHURINE 5.0  GLUCOSEU  NEGATIVE  HGBUR NEGATIVE  BILIRUBINUR NEGATIVE  KETONESUR NEGATIVE  PROTEINUR 100*  NITRITE NEGATIVE  LEUKOCYTESUR NEGATIVE    Lipid Panel:  No results found for: CHOL, TRIG, HDL, CHOLHDL, VLDL, LDLCALC  HgbA1C: No results found for: HGBA1C  Urine Drug Screen:  No results found for: LABOPIA, COCAINSCRNUR, LABBENZ, AMPHETMU, THCU, LABBARB  Alcohol Level: No results for input(s): ETH in the last 168 hours.  Other results: EKG: sinus rhythm at 65 bpm with first degree AV block.  Imaging: Ct Head Wo Contrast  Result Date: 02/02/2017 CLINICAL DATA:  Altered mental status and generalized weakness occurred during or after dialysis. Stroke risk factors include diabetes, hypertension, and hypercholesterolemia as well as history of previous stroke. EXAM: CT HEAD WITHOUT CONTRAST TECHNIQUE: Contiguous axial images were obtained from the base of the skull through the vertex without intravenous contrast. COMPARISON:  None. FINDINGS: Brain: No evidence for acute infarction, hemorrhage, mass lesion, hydrocephalus, or extra-axial fluid. Generalized atrophy. Hypoattenuation of white matter, likely small vessel disease. Evidence of chronic posterior circulation ischemia, with brain substance loss affecting the LEFT cerebellum, also and focal well-defined hypodensity involving the RIGHT paramedian pons. Additional focus of hypoattenuation involves the RIGHT middle cerebellar peduncle, probably chronic as well. No definite acute cortical or deep white matter infarcts. Vascular: Calcification of the cavernous internal carotid arteries, vertebral arteries, and basilar artery consistent with cerebrovascular atherosclerotic disease. No signs of intracranial large vessel occlusion. Skull: Normal. Negative for fracture or focal lesion. Sinuses/Orbits: No acute finding. Phthisis bulbi, RIGHT. LEFT cataract  extraction. Other: None. IMPRESSION: Atrophy and small vessel disease. Evidence for significant remote posterior circulation ischemic change. If concern for acute infarction, and no contraindications, MRI of the brain without contrast recommended. Cerebrovascular atherosclerotic disease. No signs of intracranial large vessel occlusion. Electronically Signed   By: Staci Righter M.D.   On: 02/02/2017 15:04   Mr Brain Wo Contrast  Result Date: 02/03/2017 CLINICAL DATA:  Altered mental status during dialysis. EXAM: MRI HEAD WITHOUT CONTRAST TECHNIQUE: Multiplanar, multiecho pulse sequences of the brain and surrounding structures were obtained without intravenous contrast. COMPARISON:  CT 02/02/2017 FINDINGS: Brain: Diffusion imaging does not show any acute or subacute infarction. Extensive old small vessel infarctions throughout the brainstem. Extensive old cerebellar infarctions left more than right. Old small vessel infarctions affecting the thalami, basal ganglia and hemispheric deep white matter. Old right frontal infarction or gliosis secondary to previous ventriculostomy. No sign of mass lesion, recent hemorrhage, hydrocephalus or extra-axial collection. Vascular: Major vessels at the base of the brain show flow. Skull and upper cervical spine: Negative Sinuses/Orbits: Sinuses clear. 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 Small amount of mastoid fluid on the left. Other: None significant IMPRESSION: No acute finding by MRI. Extensive old ischemic changes throughout the brain, particularly affecting the brainstem and cerebellum, as outlined above. Electronically Signed   By: Nelson Chimes M.D.   On: 02/03/2017 10:50     Assessment/Plan: 66 year old male presenting after an episode of altered awareness.  Patient with a previous similar episode about a year ago.  MRI of the brain reviewed and shows no acute changes but significant chronic ischemic changes.  EEG shows no epileptiform discharges.  With  neurological work up being unremarkable would investigate other possible etiologies. No further neurologic intervention is recommended at this time.  If further questions arise, please call or page at that time.  Thank you for allowing neurology to participate in the care of this patient.  Continue ASA and Plavix  Alexis Goodell, MD Neurology [phone] 02/03/2017, 5:34 PM

## 2017-02-03 NOTE — Progress Notes (Signed)
Per Dr. Posey Pronto okay to discontinue telesitter as pt has had no signs of agitation or combative behavior.

## 2017-02-03 NOTE — Clinical Social Work Note (Signed)
Clinical Social Work Assessment  Patient Details  Name: Dennis Walker MRN: [id number] Date of Birth: [birth year]  Date of referral:  02/03/17               Reason for consult:  Facility Placement                Permission sought to share information with:  Facility Art therapist granted to share information::  Yes, Verbal Permission Granted  Name::        Agency::     Relationship::     Contact Information:     Housing/Transportation Living arrangements for the past 2 months:  [place] of Information:  Patient Patient Interpreter Needed:  None Criminal Activity/Legal Involvement Pertinent to Current Situation/Hospitalization:  No - Comment as needed Significant Relationships:  Adult Children Lives with:  Facility Resident Do you feel safe going back to the place where you live?  Yes Need for family participation in patient care:  Yes (Comment)  Care giving concerns:  None.   Social Worker assessment / plan:  CSW spoke with patient this afternoon and explained role and purpose of visit. Patient confirmed he has been at [facility] for 3 mths and that he wishes to return. Patient is LTC there. Patient came from [place], [place] so he could be closer to his family.   Employment status:  Disabled (Comment on whether or not currently receiving Disability) Insurance information:  Medicare PT Recommendations:    Information / Referral to community resources:     Patient/Family's Response to care:  Patient expressed appreciation for CSW visit.  Patient/Family's Understanding of and Emotional Response to Diagnosis, Current Treatment, and Prognosis:  Patient has no concerns about his current medical condition and is in agreement with discharge when time.  Emotional Assessment Appearance:  Appears stated age Attitude/Demeanor/Rapport:   (pleasant and cooperative) Affect (typically observed):  Accepting, Calm Orientation:  Oriented to Self,  Oriented to Place, Oriented to  Time, Oriented to Situation Alcohol / Substance use:  Not Applicable Psych involvement (Current and /or in the community):  No (Comment)  Discharge Needs  Concerns to be addressed:  Care Coordination Readmission within the last 30 days:  No Current discharge risk:  None Barriers to Discharge:  No Barriers Identified   Shela Leff, LCSW 02/03/2017, 4:16 PM

## 2017-02-03 NOTE — Care Management Obs Status (Signed)
[place] NOTIFICATION   Patient Details  Name: Dennis Walker MRN: [id number] Date of Birth: February 27, 1951   Medicare Observation Status Notification Given:  Yes    Beverly Sessions, RN 02/03/2017, 4:24 PM

## 2017-02-04 DIAGNOSIS — R4182 Altered mental status, unspecified: Secondary | ICD-10-CM | POA: Diagnosis not present

## 2017-02-04 LAB — GLUCOSE, CAPILLARY
Glucose-Capillary: 187 mg/dL — ABNORMAL HIGH (ref 65–99)
Glucose-Capillary: 200 mg/dL — ABNORMAL HIGH (ref 65–99)
Glucose-Capillary: 190 mg/dL — ABNORMAL HIGH (ref 65–99)
Glucose-Capillary: 290 mg/dL — ABNORMAL HIGH (ref 65–99)

## 2017-02-04 LAB — HIV ANTIBODY (ROUTINE TESTING W REFLEX): HIV SCREEN 4TH GENERATION: NONREACTIVE

## 2017-02-04 NOTE — Progress Notes (Signed)
PRE DIALYSIS ASSESSMENT 

## 2017-02-04 NOTE — Discharge Summary (Addendum)
[place] [facility] [place] NAME: Dennis Walker    MR#:  [id number]  DATE OF BIRTH:  01-10-1951  DATE OF ADMISSION:  02/02/2017 ADMITTING PHYSICIAN: Henreitta Leber, MD  DATE OF DISCHARGE: 02/04/17  PRIMARY CARE PHYSICIAN: Juluis Pitch, MD    ADMISSION DIAGNOSIS:  Altered mental status, unspecified altered mental status type [R41.82]  DISCHARGE DIAGNOSIS:  AMS resolved---etio unclear ESRD on HD HTN SECONDARY DIAGNOSIS:   Past Medical History:  Diagnosis Date  . Chronic kidney disease   . Diabetes mellitus without complication ([place])   . Hyperlipidemia   . Hypertension   . Stroke [facility])     HOSPITAL COURSE:  66 year old male with past medical history of end-stage renal disease on hemodialysis, who was history of CVA, diabetes, hypertension, hyperlipidemia who presents to the hospital after hemodialysis and noted to have altered mental status.  1. Altered mental status-etiology unclear presently. Patient had a CT head on admission which was negative for acute pathology. - MRI of the brain is negative - EEG no epileptiform activity noted - neurology consult appreciated---no further w/u needed - pt's mental status appears at baseline alert and oriented x3  2. End-stage renal disease on hemodialysis-patient gets dialysis on Tuesday Thursday and Saturday. - No further acute indication for hemodialysis presently. -nephrology consulted--will get pt dialyzed today  3. History of previous CVA-continue aspirin, Plavix, atorvastatin.   4. History of diabetes SSI and resume levemir  5. Essential hypertension-continue lisinopril, metoprolol, metolazone.  6. Consider hyperparathyroidism-continue Renvela. Next  7. Depression-continue Zoloft.  Overall stable. Will d/c after HD today to [facility]   CONSULTS OBTAINED:  Treatment Team:  Alexis Goodell, MD Murlean Iba, MD  DRUG ALLERGIES:   Allergies  Allergen Reactions  .  Cefuroxime Itching  . Shrimp [Shellfish Allergy] Other (See Comments)    Per mar   . Sulfa Antibiotics Other (See Comments)    Per mar    DISCHARGE MEDICATIONS:   Current Discharge Medication List    CONTINUE these medications which have NOT CHANGED   Details  aspirin EC 81 MG tablet Take 81 mg by mouth daily at 6 PM. (1800)    atorvastatin (LIPITOR) 40 MG tablet Take 40 mg by mouth daily at 6 PM. (1800)    b complex vitamins capsule Take 1 capsule by mouth daily. (1800)    bumetanide (BUMEX) 2 MG tablet Take 2 mg by mouth 2 (two) times daily. (0600 & 1800)    clopidogrel (PLAVIX) 75 MG tablet Take 75 mg by mouth daily at 6 PM. (1800)    ferrous sulfate 325 (65 FE) MG tablet Take 325 mg by mouth 2 (two) times daily. (0600 &1800)    !! gabapentin (NEURONTIN) 100 MG capsule Take 100 mg by mouth 2 (two) times daily. (0600 & 1800)    !! gabapentin (NEURONTIN) 100 MG capsule Take 400 mg by mouth daily as needed (for pain.).    Insulin Detemir (LEVEMIR FLEXPEN) 100 UNIT/ML Pen Inject 24 Units into the skin daily at 10 pm. (2100)    lactulose (CHRONULAC) 10 GM/15ML solution Take 20 g by mouth at bedtime. (2100)    lidocaine-prilocaine (EMLA) cream Apply 1 application topically Every Tuesday,Thursday,and Saturday with dialysis. (0500)    linagliptin (TRADJENTA) 5 MG TABS tablet Take 2.5 mg by mouth daily at 6 (six) AM. (0630)    lisinopril (PRINIVIL,ZESTRIL) 5 MG tablet Take 5 mg by mouth daily. (0600)    Melatonin 3 MG TABS Take  3 mg by mouth daily at 10 pm. (2100)    metolazone (ZAROXOLYN) 5 MG tablet Take 5 mg by mouth daily. (0600)    metoprolol succinate (TOPROL-XL) 100 MG 24 hr tablet Take 100 mg by mouth daily. (1800) Take with or immediately following a meal.    nitroGLYCERIN (NITROSTAT) 0.4 MG SL tablet Place 0.4 mg under the tongue every 5 (five) minutes as needed for chest pain.    polyethylene glycol (MIRALAX / GLYCOLAX) packet Take 17 g by mouth daily. (1800)     ranolazine (RANEXA) 500 MG 12 hr tablet Take 500 mg by mouth 2 (two) times daily. (0600 &1800)    senna (SENOKOT) 8.6 MG TABS tablet Take 8.6 mg by mouth 2 (two) times daily. (0600 & 1800)    sertraline (ZOLOFT) 100 MG tablet Take 100 mg by mouth daily. (2100)    sevelamer carbonate (RENVELA) 800 MG tablet Take 1,600 mg by mouth 3 (three) times daily with meals. (1230, 1800, 2100)    Skin Protectants, Misc. (EUCERIN) cream Apply 1 application topically 2 (two) times daily as needed (for pruritus).    Nutritional Supplements (FEEDING SUPPLEMENT, NEPRO CARB STEADY,) LIQD Take 237 mLs by mouth 2 (two) times daily. (0900 & 1800)     !! - Potential duplicate medications found. Please discuss with provider.    STOP taking these medications     oxyCODONE-acetaminophen (PERCOCET/ROXICET) 5-325 MG tablet         If you experience worsening of your admission symptoms, develop shortness of breath, life threatening emergency, suicidal or homicidal thoughts you must seek medical attention immediately by calling 911 or calling your MD immediately  if symptoms less severe.  You Must read complete instructions/literature along with all the possible adverse reactions/side effects for all the Medicines you take and that have been prescribed to you. Take any new Medicines after you have completely understood and accept all the possible adverse reactions/side effects.   Please note  You were cared for by a hospitalist during your hospital stay. If you have any questions about your discharge medications or the care you received while you were in the hospital after you are discharged, you can call the unit and asked to speak with the hospitalist on call if the hospitalist that took care of you is not available. Once you are discharged, your primary care physician will handle any further medical issues. Please note that NO REFILLS for any discharge medications will be authorized once you are discharged, as it  is imperative that you return to your primary care physician (or establish a relationship with a primary care physician if you do not have one) for your aftercare needs so that they can reassess your need for medications and monitor your lab values. Today   SUBJECTIVE   Feels better. Getting ready to go for HD VITAL SIGNS:  Blood pressure (!) 107/54, pulse 64, temperature 97.8 F (36.6 C), temperature source Oral, resp. rate 16, height 5' 9.5" (1.765 m), weight 86.6 kg (190 lb 14.4 oz), SpO2 98 %.  I/O:   Intake/Output Summary (Last 24 hours) at 02/04/17 1011 Last data filed at 02/04/17 0945  Gross per 24 hour  Intake             1200 ml  Output              175 ml  Net             1025 ml    PHYSICAL EXAMINATION:  GENERAL:  66 y.o.-year-old patient lying in the bed with no acute distress.  EYES: Pupils equal, round, reactive to light and accommodation. No scleral icterus. Extraocular muscles intact.  HEENT: Head atraumatic, normocephalic. Oropharynx and nasopharynx clear.  NECK:  Supple, no jugular venous distention. No thyroid enlargement, no tenderness.  LUNGS: Normal breath sounds bilaterally, no wheezing, rales,rhonchi or crepitation. No use of accessory muscles of respiration.  CARDIOVASCULAR: S1, S2 normal. No murmurs, rubs, or gallops.  ABDOMEN: Soft, non-tender, non-distended. Bowel sounds present. No organomegaly or mass.  EXTREMITIES:cyanosis, or clubbing. BKA+ NEUROLOGIC: Cranial nerves II through XII are intact. Muscle strength 5/5 in all extremities. Sensation intact. Gait not checked.  PSYCHIATRIC: The patient is alert and oriented x 3.  SKIN: No obvious rash, lesion, or ulcer.   DATA REVIEW:   CBC   Recent Labs Lab 02/03/17 0320  WBC 7.4  HGB 13.2  HCT 39.6*  PLT 167    Chemistries   Recent Labs Lab 02/03/17 0320  NA 133*  K 4.2  CL 90*  CO2 29  GLUCOSE 120*  BUN 58*  CREATININE 8.81*  CALCIUM 8.1*    Microbiology Results   Recent Results  (from the past 240 hour(s))  MRSA PCR Screening     Status: None   Collection Time: 02/03/17 12:53 PM  Result Value Ref Range Status   MRSA by PCR NEGATIVE NEGATIVE Final    Comment:        The GeneXpert MRSA Assay (FDA approved for NASAL specimens only), is one component of a comprehensive MRSA colonization surveillance program. It is not intended to diagnose MRSA infection nor to guide or monitor treatment for MRSA infections.     RADIOLOGY:  Ct Head Wo Contrast  Result Date: 02/02/2017 CLINICAL DATA:  Altered mental status and generalized weakness occurred during or after dialysis. Stroke risk factors include diabetes, hypertension, and hypercholesterolemia as well as history of previous stroke. EXAM: CT HEAD WITHOUT CONTRAST TECHNIQUE: Contiguous axial images were obtained from the base of the skull through the vertex without intravenous contrast. COMPARISON:  None. FINDINGS: Brain: No evidence for acute infarction, hemorrhage, mass lesion, hydrocephalus, or extra-axial fluid. Generalized atrophy. Hypoattenuation of white matter, likely small vessel disease. Evidence of chronic posterior circulation ischemia, with brain substance loss affecting the LEFT cerebellum, also and focal well-defined hypodensity involving the RIGHT paramedian pons. Additional focus of hypoattenuation involves the RIGHT middle cerebellar peduncle, probably chronic as well. No definite acute cortical or deep white matter infarcts. Vascular: Calcification of the cavernous internal carotid arteries, vertebral arteries, and basilar artery consistent with cerebrovascular atherosclerotic disease. No signs of intracranial large vessel occlusion. Skull: Normal. Negative for fracture or focal lesion. Sinuses/Orbits: No acute finding. Phthisis bulbi, RIGHT. LEFT cataract extraction. Other: None. IMPRESSION: Atrophy and small vessel disease. Evidence for significant remote posterior circulation ischemic change. If concern for  acute infarction, and no contraindications, MRI of the brain without contrast recommended. Cerebrovascular atherosclerotic disease. No signs of intracranial large vessel occlusion. Electronically Signed   By: Staci Righter M.D.   On: 02/02/2017 15:04   Mr Brain Wo Contrast  Result Date: 02/03/2017 CLINICAL DATA:  Altered mental status during dialysis. EXAM: MRI HEAD WITHOUT CONTRAST TECHNIQUE: Multiplanar, multiecho pulse sequences of the brain and surrounding structures were obtained without intravenous contrast. COMPARISON:  CT 02/02/2017 FINDINGS: Brain: Diffusion imaging does not show any acute or subacute infarction. Extensive old small vessel infarctions throughout the brainstem. Extensive old cerebellar infarctions left more than right. Old small  vessel infarctions affecting the thalami, basal ganglia and hemispheric deep white matter. Old right frontal infarction or gliosis secondary to previous ventriculostomy. No sign of mass lesion, recent hemorrhage, hydrocephalus or extra-axial collection. Vascular: Major vessels at the base of the brain show flow. Skull and upper cervical spine: Negative Sinuses/Orbits: Sinuses clear. 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 Small amount of mastoid fluid on the left. Other: None significant IMPRESSION: No acute finding by MRI. Extensive old ischemic changes throughout the brain, particularly affecting the brainstem and cerebellum, as outlined above. Electronically Signed   By: Nelson Chimes M.D.   On: 02/03/2017 10:50     Management plans discussed with the patient, family and they are in agreement.  CODE STATUS:     Code Status Orders        Start     Ordered   02/02/17 1831  Full code  Continuous     02/02/17 1830    Code Status History    Date Active Date Inactive Code Status Order ID Comments User Context   This patient has a current code status but no historical code status.    Advance Directive Documentation     Most Recent Value  Type of  Advance Directive  Living will, Healthcare Power of Attorney  Pre-existing out of facility DNR order (yellow form or pink MOST form)  -  "MOST" Form in Place?  -      TOTAL TIME TAKING CARE OF THIS PATIENT: 40 minutes.    Liesel Peckenpaugh M.D on 02/04/2017 at 10:11 AM  Between 7am to 6pm - Pager - [phone] After 6pm go to [email or web address] - password [email or web address]  Office  [phone]  CC: Primary care physician; Juluis Pitch, MD

## 2017-02-04 NOTE — [facility] Notes (Signed)
POST DIALYSIS ASSESSMENT 

## 2017-02-04 NOTE — [facility] Notes (Signed)
HD COMPLETED  

## 2017-02-04 NOTE — [facility] Notes (Signed)
This note also relates to the following rows which could not be included: Pulse Rate - Cannot attach notes to unvalidated device data Resp - Cannot attach notes to unvalidated device data BP - Cannot attach notes to unvalidated device data  HD STARTED

## 2017-02-04 NOTE — Clinical Social Work Note (Signed)
CSW spoke with the patient's daughter about the discharge plan. The patient is set to discharge tomorrow back to [facility] and will be in a different room. The patient's daughter verbalized understanding and requested that the CSW contact the admissions coordinator at [facility] to have him call her. CSW has done so. CSW will continue to follow for discharge planning.  Santiago Bumpers, MSW, Latanya Presser [phone]

## 2017-02-04 NOTE — [facility] Notes (Signed)
Nephrology consult for HD as patient missed his appt at the outpatient dialysis unit due to being hospitalized    HEMODIALYSIS FLOWSHEET:  Blood Flow Rate (mL/min): 350 mL/min Arterial Pressure (mmHg): -210 mmHg Venous Pressure (mmHg): 160 mmHg Transmembrane Pressure (mmHg): 60 mmHg Ultrafiltration Rate (mL/min): 430 mL/min Dialysate Flow Rate (mL/min): 800 ml/min Conductivity: Machine : 14.9 Conductivity: Machine : 14.9 Dialysis Fluid Bolus: Normal Saline Bolus Amount (mL): 250 mL   Patient seen during dialysis Tolerating well  Vitals:   02/03/17 2025 02/04/17 0537 02/04/17 1015 02/04/17 1030  BP: (!) 114/59 (!) 107/54 (!) 145/59 (!) 152/66  Pulse: 68 64 72 72  Resp: 18 16 13 15   Temp: 98.3 F (36.8 C) 97.8 F (36.6 C) 98 F (36.7 C)   TempSrc: Oral Oral Oral   SpO2: 94% 98%    Weight:   88.9 kg (195 lb 15.8 oz)   Height:       ESRD [facility]  Continue HD Patient expected to be d/c today

## 2017-02-05 DIAGNOSIS — R4182 Altered mental status, unspecified: Secondary | ICD-10-CM | POA: Diagnosis not present

## 2017-02-05 LAB — GLUCOSE, CAPILLARY
Glucose-Capillary: 172 mg/dL — ABNORMAL HIGH (ref 65–99)
Glucose-Capillary: 226 mg/dL — ABNORMAL HIGH (ref 65–99)

## 2017-02-05 MED ORDER — LINAGLIPTIN 5 MG PO TABS: 5.0000 mg | ORAL_TABLET | Freq: Every day | ORAL | 0 refills | Status: DC

## 2017-02-05 MED ORDER — GABAPENTIN 100 MG PO CAPS: 400.0000 mg | ORAL_CAPSULE | Freq: Every day | ORAL | 0 refills | Status: DC

## 2017-02-05 NOTE — [facility] Notes (Signed)
Pt alert and oriented x4, no complaints of pain or discomfort.  Bed in low position, call bell within reach.  Bed alarms on and functioning.  Assessment done and charted.  Will continue to monitor and do hourly rounding throughout the shift 

## 2017-02-05 NOTE — Clinical Social Work Note (Addendum)
The patient will discharge today to [facility] via non-emergent EMS. The patient's family and facility are aware and are in agreement. All documentation has been sent to the facility. The packet will be delivered to that chart once the facility confirms the patient's room number and report number. CSW will continue to follow pending additional discharge needs.  Santiago Bumpers, MSW, Latanya Presser [phone]

## 2017-02-05 NOTE — Discharge Summary (Addendum)
[facility] [place] NAME: Dennis Walker    MR#:  [id number]  DATE OF BIRTH:  1951/02/08  DATE OF ADMISSION:  02/02/2017 ADMITTING PHYSICIAN: Henreitta Leber, MD  DATE OF DISCHARGE: 02/05/17  PRIMARY CARE PHYSICIAN: Juluis Pitch, MD    ADMISSION DIAGNOSIS:  Altered mental status, unspecified altered mental status type [R41.82]  DISCHARGE DIAGNOSIS:  AMS resolved---etio unclear ESRD on HD HTN SECONDARY DIAGNOSIS:   Past Medical History:  Diagnosis Date  . Chronic kidney disease   . Diabetes mellitus without complication ([place])   . Hyperlipidemia   . Hypertension   . Stroke [facility])     HOSPITAL COURSE:  66 year old male with past medical history of end-stage renal disease on hemodialysis, who was history of CVA, diabetes, hypertension, hyperlipidemia who presents to the hospital after hemodialysis and noted to have altered mental status.  1. Altered mental status-etiology unclear presently. Patient had a CT head on admission which was negative for acute pathology. - MRI of the brain is negative - EEG no epileptiform activity noted - neurology consult appreciated---no further w/u needed - pt's mental status appears at baseline alert and oriented x3  2. End-stage renal disease on hemodialysis-patient gets dialysis on Tuesday Thursday and Saturday. - No further acute indication for hemodialysis presently. -nephrology consulted--will get pt dialyzed today  3. History of previous CVA-continue aspirin, Plavix, atorvastatin.   4. History of diabetes SSI and resume levemir  5. Essential hypertension-continue lisinopril, metoprolol, metolazone.  6. Consider hyperparathyroidism-continue Renvela. Next  7. Depression-continue Zoloft.  Overall stable. Will d/c  today to [facility]   CONSULTS OBTAINED:  Treatment Team:  Alexis Goodell, MD Murlean Iba, MD  DRUG ALLERGIES:   Allergies  Allergen Reactions  . Cefuroxime  Itching  . Shrimp [Shellfish Allergy] Other (See Comments)    Per mar   . Sulfa Antibiotics Other (See Comments)    Per mar    DISCHARGE MEDICATIONS:   Current Discharge Medication List    CONTINUE these medications which have CHANGED   Details  !! gabapentin (NEURONTIN) 100 MG capsule Take 4 capsules (400 mg total) by mouth at bedtime. Qty: 15 capsule, Refills: 0    linagliptin (TRADJENTA) 5 MG TABS tablet Take 1 tablet (5 mg total) by mouth daily at 6 (six) AM. (0630) Qty: 30 tablet, Refills: 0     !! - Potential duplicate medications found. Please discuss with provider.    CONTINUE these medications which have NOT CHANGED   Details  aspirin EC 81 MG tablet Take 81 mg by mouth daily at 6 PM. (1800)    atorvastatin (LIPITOR) 40 MG tablet Take 40 mg by mouth daily at 6 PM. (1800)    b complex vitamins capsule Take 1 capsule by mouth daily. (1800)    bumetanide (BUMEX) 2 MG tablet Take 2 mg by mouth 2 (two) times daily. (0600 & 1800)    clopidogrel (PLAVIX) 75 MG tablet Take 75 mg by mouth daily at 6 PM. (1800)    ferrous sulfate 325 (65 FE) MG tablet Take 325 mg by mouth 2 (two) times daily. (0600 &1800)    !! gabapentin (NEURONTIN) 100 MG capsule Take 100 mg by mouth 2 (two) times daily. (0600 & 1800)    Insulin Detemir (LEVEMIR FLEXPEN) 100 UNIT/ML Pen Inject 24 Units into the skin daily at 10 pm. (2100)    lactulose (CHRONULAC) 10 GM/15ML solution Take 20 g by mouth at bedtime. (2100)  lidocaine-prilocaine (EMLA) cream Apply 1 application topically Every Tuesday,Thursday,and Saturday with dialysis. (0500)    lisinopril (PRINIVIL,ZESTRIL) 5 MG tablet Take 5 mg by mouth daily. (0600)    Melatonin 3 MG TABS Take 3 mg by mouth daily at 10 pm. (2100)    metolazone (ZAROXOLYN) 5 MG tablet Take 5 mg by mouth daily. (0600)    metoprolol succinate (TOPROL-XL) 100 MG 24 hr tablet Take 100 mg by mouth daily. (1800) Take with or immediately following a meal.     nitroGLYCERIN (NITROSTAT) 0.4 MG SL tablet Place 0.4 mg under the tongue every 5 (five) minutes as needed for chest pain.    polyethylene glycol (MIRALAX / GLYCOLAX) packet Take 17 g by mouth daily. (1800)    ranolazine (RANEXA) 500 MG 12 hr tablet Take 500 mg by mouth 2 (two) times daily. (0600 &1800)    senna (SENOKOT) 8.6 MG TABS tablet Take 8.6 mg by mouth 2 (two) times daily. (0600 & 1800)    sertraline (ZOLOFT) 100 MG tablet Take 100 mg by mouth daily. (2100)    sevelamer carbonate (RENVELA) 800 MG tablet Take 1,600 mg by mouth 3 (three) times daily with meals. (1230, 1800, 2100)    Skin Protectants, Misc. (EUCERIN) cream Apply 1 application topically 2 (two) times daily as needed (for pruritus).    Nutritional Supplements (FEEDING SUPPLEMENT, NEPRO CARB STEADY,) LIQD Take 237 mLs by mouth 2 (two) times daily. (0900 & 1800)     !! - Potential duplicate medications found. Please discuss with provider.    STOP taking these medications     oxyCODONE-acetaminophen (PERCOCET/ROXICET) 5-325 MG tablet         If you experience worsening of your admission symptoms, develop shortness of breath, life threatening emergency, suicidal or homicidal thoughts you must seek medical attention immediately by calling 911 or calling your MD immediately  if symptoms less severe.  You Must read complete instructions/literature along with all the possible adverse reactions/side effects for all the Medicines you take and that have been prescribed to you. Take any new Medicines after you have completely understood and accept all the possible adverse reactions/side effects.   Please note  You were cared for by a hospitalist during your hospital stay. If you have any questions about your discharge medications or the care you received while you were in the hospital after you are discharged, you can call the unit and asked to speak with the hospitalist on call if the hospitalist that took care of you is not  available. Once you are discharged, your primary care physician will handle any further medical issues. Please note that NO REFILLS for any discharge medications will be authorized once you are discharged, as it is imperative that you return to your primary care physician (or establish a relationship with a primary care physician if you do not have one) for your aftercare needs so that they can reassess your need for medications and monitor your lab values. Today   SUBJECTIVE   Feels better.  VITAL SIGNS:  Blood pressure 125/60, pulse 72, temperature 98.2 F (36.8 C), temperature source Oral, resp. rate 17, height 5' 9.5" (1.765 m), weight 86.4 kg (190 lb 7.6 oz), SpO2 100 %.  I/O:    Intake/Output Summary (Last 24 hours) at 02/05/17 1357 Last data filed at 02/05/17 1339  Gross per 24 hour  Intake             1325 ml  Output  0 ml  Net             1325 ml    PHYSICAL EXAMINATION:  GENERAL:  66 y.o.-year-old patient lying in the bed with no acute distress.  EYES: Pupils equal, round, reactive to light and accommodation. No scleral icterus. Extraocular muscles intact.  HEENT: Head atraumatic, normocephalic. Oropharynx and nasopharynx clear.  NECK:  Supple, no jugular venous distention. No thyroid enlargement, no tenderness.  LUNGS: Normal breath sounds bilaterally, no wheezing, rales,rhonchi or crepitation. No use of accessory muscles of respiration.  CARDIOVASCULAR: S1, S2 normal. No murmurs, rubs, or gallops.  ABDOMEN: Soft, non-tender, non-distended. Bowel sounds present. No organomegaly or mass.  EXTREMITIES:cyanosis, or clubbing. BKA+ NEUROLOGIC: Cranial nerves II through XII are intact. Muscle strength 5/5 in all extremities. Sensation intact. Gait not checked.  PSYCHIATRIC: The patient is alert and oriented x 3.  SKIN: No obvious rash, lesion, or ulcer.   DATA REVIEW:   CBC   Recent Labs Lab 02/03/17 0320  WBC 7.4  HGB 13.2  HCT 39.6*  PLT 167     Chemistries   Recent Labs Lab 02/03/17 0320  NA 133*  K 4.2  CL 90*  CO2 29  GLUCOSE 120*  BUN 58*  CREATININE 8.81*  CALCIUM 8.1*    Microbiology Results   Recent Results (from the past 240 hour(s))  MRSA PCR Screening     Status: None   Collection Time: 02/03/17 12:53 PM  Result Value Ref Range Status   MRSA by PCR NEGATIVE NEGATIVE Final    Comment:        The GeneXpert MRSA Assay (FDA approved for NASAL specimens only), is one component of a comprehensive MRSA colonization surveillance program. It is not intended to diagnose MRSA infection nor to guide or monitor treatment for MRSA infections.     RADIOLOGY:  No results found.   Management plans discussed with the patient, family and they are in agreement.  CODE STATUS:     Code Status Orders        Start     Ordered   02/02/17 1831  Full code  Continuous     02/02/17 1830    Code Status History    Date Active Date Inactive Code Status Order ID Comments User Context   This patient has a current code status but no historical code status.    Advance Directive Documentation     Most Recent Value  Type of Advance Directive  Living will, Healthcare Power of Attorney  Pre-existing out of facility DNR order (yellow form or pink MOST form)  -  "MOST" Form in Place?  -      TOTAL TIME TAKING CARE OF THIS PATIENT: 40 minutes.    Christana Angelica M.D on 02/05/2017 at 1:57 PM  Between 7am to 6pm - Pager - [phone] After 6pm go to [email or web address] - password [email or web address] [facility]  Office  [phone]  CC: Primary care physician; Juluis Pitch, MD

## 2017-02-05 NOTE — [facility] Notes (Signed)
EMS called to transport pt to [facility]

## 2017-02-05 NOTE — Progress Notes (Signed)
Discharge:  Pt will be discharged form [facility] via EMS via stretcher. Report called to the [facility] facility and spoke to [employer].  EMS called to make arrangements to transport pt.  Facility asked to hold pt here till 3pm for bed and room to become available.

## 2017-02-09 ENCOUNTER — Ambulatory Visit (INDEPENDENT_AMBULATORY_CARE_PROVIDER_SITE_OTHER): Payer: Medicare Other | Admitting: Vascular Surgery

## 2017-02-09 ENCOUNTER — Encounter (INDEPENDENT_AMBULATORY_CARE_PROVIDER_SITE_OTHER): Payer: Self-pay | Admitting: Vascular Surgery

## 2017-02-09 ENCOUNTER — Encounter (INDEPENDENT_AMBULATORY_CARE_PROVIDER_SITE_OTHER): Payer: Medicare Other

## 2017-02-09 ENCOUNTER — Ambulatory Visit (INDEPENDENT_AMBULATORY_CARE_PROVIDER_SITE_OTHER): Payer: Medicare Other

## 2017-02-09 VITALS — BP 124/66 | HR 64 | Resp 14 | Ht 71.0 in | Wt 200.0 lb

## 2017-02-09 DIAGNOSIS — L97909 Non-pressure chronic ulcer of unspecified part of unspecified lower leg with unspecified severity: Secondary | ICD-10-CM | POA: Diagnosis not present

## 2017-02-09 DIAGNOSIS — I7025 Atherosclerosis of native arteries of other extremities with ulceration: Secondary | ICD-10-CM

## 2017-02-09 DIAGNOSIS — E7800 Pure hypercholesterolemia, unspecified: Secondary | ICD-10-CM

## 2017-02-09 DIAGNOSIS — T8789 Other complications of amputation stump: Secondary | ICD-10-CM

## 2017-02-09 DIAGNOSIS — N186 End stage renal disease: Secondary | ICD-10-CM | POA: Diagnosis not present

## 2017-02-10 ENCOUNTER — Other Ambulatory Visit (INDEPENDENT_AMBULATORY_CARE_PROVIDER_SITE_OTHER): Payer: Self-pay | Admitting: Vascular Surgery

## 2017-02-10 DIAGNOSIS — N185 Chronic kidney disease, stage 5: Secondary | ICD-10-CM

## 2017-02-12 DIAGNOSIS — N186 End stage renal disease: Secondary | ICD-10-CM | POA: Insufficient documentation

## 2017-02-12 NOTE — Progress Notes (Signed)
MRN : [id number]  Dennis Walker is a 66 y.o. (March 23, 1951) male who presents with chief complaint of  Chief Complaint  Patient presents with  . Follow-up    ASAP aortic iliac to femoral u/s  .  History of Present Illness:The patient returns to the office for followup and review of the noninvasive studies. There have been no interval changes in lower extremity symptoms. No interval shortening of the patient's claudication distance or development of rest pain symptoms.  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  No new ulcers or wounds have occurred since the last visit.  There have been no significant changes to the patient's overall health care.  He notes the HD center is having occasional problems with access but no bleeding   The patient denies amaurosis fugax or recent TIA symptoms. There are no recent neurological changes noted. The patient denies history of DVT, PE or superficial thrombophlebitis. The patient denies recent episodes of angina or shortness of breath.   Duplex ultrasound of the aorta iliac does not find any hemodynamically significant stenosis  Current Meds  Medication Sig  . aspirin EC 81 MG tablet Take 81 mg by mouth daily at 6 PM. (1800)  . atorvastatin (LIPITOR) 40 MG tablet Take 40 mg by mouth daily at 6 PM. (1800)  . b complex vitamins capsule Take 1 capsule by mouth daily. (1800)  . bumetanide (BUMEX) 2 MG tablet Take 2 mg by mouth 2 (two) times daily. (0600 & 1800)  . clopidogrel (PLAVIX) 75 MG tablet Take 75 mg by mouth daily at 6 PM. (1800)  . ferrous sulfate 325 (65 FE) MG tablet Take 325 mg by mouth 2 (two) times daily. (0600 &1800)  . gabapentin (NEURONTIN) 100 MG capsule Take 4 capsules (400 mg total) by mouth at bedtime.  . Insulin Detemir (LEVEMIR FLEXPEN) 100 UNIT/ML Pen Inject 24 Units into the skin daily at 10 pm. (2100)  . lactulose (CHRONULAC) 10 GM/15ML solution Take 20 g by mouth at bedtime. (2100)  . lidocaine-prilocaine  (EMLA) cream Apply 1 application topically Every Tuesday,Thursday,and Saturday with dialysis. (0500)  . linagliptin (TRADJENTA) 5 MG TABS tablet Take 1 tablet (5 mg total) by mouth daily at 6 (six) AM. (0630)  . lisinopril (PRINIVIL,ZESTRIL) 5 MG tablet Take 5 mg by mouth daily. (0600)  . Melatonin 3 MG TABS Take 3 mg by mouth daily at 10 pm. (2100)  . metolazone (ZAROXOLYN) 5 MG tablet Take 5 mg by mouth daily. (0600)  . metoprolol succinate (TOPROL-XL) 100 MG 24 hr tablet Take 100 mg by mouth daily. (1800) Take with or immediately following a meal.  . nitroGLYCERIN (NITROSTAT) 0.4 MG SL tablet Place 0.4 mg under the tongue every 5 (five) minutes as needed for chest pain.  . Nutritional Supplements (FEEDING SUPPLEMENT, NEPRO CARB STEADY,) LIQD Take 237 mLs by mouth 2 (two) times daily. (0900 & 1800)  . polyethylene glycol (MIRALAX / GLYCOLAX) packet Take 17 g by mouth daily. (1800)  . ranolazine (RANEXA) 500 MG 12 hr tablet Take 500 mg by mouth 2 (two) times daily. (0600 &1800)  . senna (SENOKOT) 8.6 MG TABS tablet Take 8.6 mg by mouth 2 (two) times daily. (0600 & 1800)  . sertraline (ZOLOFT) 100 MG tablet Take 100 mg by mouth daily. (2100)  . sevelamer carbonate (RENVELA) 800 MG tablet Take 1,600 mg by mouth 3 (three) times daily with meals. (1230, 1800, 2100)  . Skin Protectants, Misc. (EUCERIN)  cream Apply 1 application topically 2 (two) times daily as needed (for pruritus).    Past Medical History:  Diagnosis Date  . Chronic kidney disease   . Diabetes mellitus without complication ([place])   . Hyperlipidemia   . Hypertension   . Stroke [facility])     Past Surgical History:  Procedure Laterality Date  . A/V FISTULAGRAM Left 01/31/2017   Procedure: A/V Fistulagram;  Surgeon: Katha Cabal, MD;  Location: [place] [facility];  Service: Cardiovascular;  Laterality: Left;    Social History Social History  Substance Use Topics  . Smoking status: Never Smoker  . Smokeless tobacco:  Never Used  . Alcohol use No    Family History Family History  Problem Relation Age of Onset  . Diabetes Neg Hx   . Cancer Neg Hx   . Hypertension Neg Hx     Allergies  Allergen Reactions  . Cefuroxime Itching  . Shrimp [Shellfish Allergy] Other (See Comments)    Per mar   . Sulfa Antibiotics Other (See Comments)    Per mar     REVIEW OF SYSTEMS (Negative unless checked)  Constitutional: [] Weight loss  [] Fever  [] Chills Cardiac: [] Chest pain   [] Chest pressure   [] Palpitations   [] Shortness of breath when laying flat   [] Shortness of breath with exertion. Vascular:  [] Pain in legs with walking   [] Pain in legs at rest  [] History of DVT   [] Phlebitis   [] Swelling in legs   [] Varicose veins   [] Non-healing ulcers Pulmonary:   [] Uses home oxygen   [] Productive cough   [] Hemoptysis   [] Wheeze  [] COPD   [] Asthma Neurologic:  [] Dizziness   [] Seizures   [] History of stroke   [] History of TIA  [] Aphasia   [] Vissual changes   [] Weakness or numbness in arm   [] Weakness or numbness in leg Musculoskeletal:   [] Joint swelling   [] Joint pain   [] Low back pain Hematologic:  [] Easy bruising  [] Easy bleeding   [] Hypercoagulable state   [] Anemic Gastrointestinal:  [] Diarrhea   [] Vomiting  [] Gastroesophageal reflux/heartburn   [] Difficulty swallowing. Genitourinary:  [] Chronic kidney disease   [] Difficult urination  [] Frequent urination   [] Blood in urine Skin:  [] Rashes   [] Ulcers  Psychological:  [] History of anxiety   []  History of major depression.  Physical Examination  Vitals:   02/09/17 0844  BP: 124/66  Pulse: 64  Resp: 14  Weight: 200 lb (90.7 kg)  Height: 5\' 11"  (1.803 m)   Body mass index is 27.89 kg/m. Gen: WD/WN, NAD Head: [place]/AT, No temporalis wasting.  Ear/Nose/Throat: Hearing grossly intact, nares w/o erythema or drainage Eyes: PER, EOMI, sclera nonicteric.  Neck: Supple, no large masses.   Pulmonary:  Good air movement, no audible wheezing bilaterally, no use of  accessory muscles.  Cardiac: RRR, no JVD Vascular:  Right BKA stump healed, left arm AV access good thrill and good bruit Vessel Right Left  Radial Palpable Palpable  Ulnar Palpable Palpable  Brachial Palpable Palpable  Carotid Palpable Palpable  Femoral Palpable Palpable  Popliteal BKA Not Palpable  PT BKA Not Palpable  DP BKA Not Palpable  Gastrointestinal: Non-distended. No guarding/no peritoneal signs.  Musculoskeletal: M/S 5/5 throughout.  No deformity or atrophy.  Neurologic: CN 2-12 intact. Symmetrical.  Speech is fluent. Motor exam as listed above. Psychiatric: Judgment intact, Mood & affect appropriate for pt's clinical situation. Dermatologic: No rashes or ulcers noted.  No changes consistent with cellulitis. Lymph : No lichenification or skin changes  of chronic lymphedema.  CBC Lab Results  Component Value Date   WBC 7.4 02/03/2017   HGB 13.2 02/03/2017   HCT 39.6 (L) 02/03/2017   MCV 92.8 02/03/2017   PLT 167 02/03/2017    BMET    Component Value Date/Time   NA 133 (L) 02/03/2017 0320   K 4.2 02/03/2017 0320   CL 90 (L) 02/03/2017 0320   CO2 29 02/03/2017 0320   GLUCOSE 120 (H) 02/03/2017 0320   BUN 58 (H) 02/03/2017 0320   CREATININE 8.81 (H) 02/03/2017 0320   CALCIUM 8.1 (L) 02/03/2017 0320   GFRNONAA 6 (L) 02/03/2017 0320   GFRAA 6 (L) 02/03/2017 0320   Estimated Creatinine Clearance: 9.8 mL/min (A) (by C-G formula based on SCr of 8.81 mg/dL (H)).  COAG No results found for: INR, PROTIME  Radiology Ct Head Wo Contrast  Result Date: 02/02/2017 CLINICAL DATA:  Altered mental status and generalized weakness occurred during or after dialysis. Stroke risk factors include diabetes, hypertension, and hypercholesterolemia as well as history of previous stroke. EXAM: CT HEAD WITHOUT CONTRAST TECHNIQUE: Contiguous axial images were obtained from the base of the skull through the vertex without intravenous contrast. COMPARISON:  None. FINDINGS: Brain: No evidence  for acute infarction, hemorrhage, mass lesion, hydrocephalus, or extra-axial fluid. Generalized atrophy. Hypoattenuation of white matter, likely small vessel disease. Evidence of chronic posterior circulation ischemia, with brain substance loss affecting the LEFT cerebellum, also and focal well-defined hypodensity involving the RIGHT paramedian pons. Additional focus of hypoattenuation involves the RIGHT middle cerebellar peduncle, probably chronic as well. No definite acute cortical or deep white matter infarcts. Vascular: Calcification of the cavernous internal carotid arteries, vertebral arteries, and basilar artery consistent with cerebrovascular atherosclerotic disease. No signs of intracranial large vessel occlusion. Skull: Normal. Negative for fracture or focal lesion. Sinuses/Orbits: No acute finding. Phthisis bulbi, RIGHT. LEFT cataract extraction. Other: None. IMPRESSION: Atrophy and small vessel disease. Evidence for significant remote posterior circulation ischemic change. If concern for acute infarction, and no contraindications, MRI of the brain without contrast recommended. Cerebrovascular atherosclerotic disease. No signs of intracranial large vessel occlusion. Electronically Signed   By: Staci Righter M.D.   On: 02/02/2017 15:04   Mr Brain Wo Contrast  Result Date: 02/03/2017 CLINICAL DATA:  Altered mental status during dialysis. EXAM: MRI HEAD WITHOUT CONTRAST TECHNIQUE: Multiplanar, multiecho pulse sequences of the brain and surrounding structures were obtained without intravenous contrast. COMPARISON:  CT 02/02/2017 FINDINGS: Brain: Diffusion imaging does not show any acute or subacute infarction. Extensive old small vessel infarctions throughout the brainstem. Extensive old cerebellar infarctions left more than right. Old small vessel infarctions affecting the thalami, basal ganglia and hemispheric deep white matter. Old right frontal infarction or gliosis secondary to previous  ventriculostomy. No sign of mass lesion, recent hemorrhage, hydrocephalus or extra-axial collection. Vascular: Major vessels at the base of the brain show flow. Skull and upper cervical spine: Negative Sinuses/Orbits: Sinuses clear. 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 Small amount of mastoid fluid on the left. Other: None significant IMPRESSION: No acute finding by MRI. Extensive old ischemic changes throughout the brain, particularly affecting the brainstem and cerebellum, as outlined above. Electronically Signed   By: Nelson Chimes M.D.   On: 02/03/2017 10:50    Assessment/Plan 1. Atherosclerosis of native arteries of the extremities with ulceration ([place])  Recommend:  The patient has evidence of atherosclerosis of the lower extremities with claudication.  The patient does not voice lifestyle limiting changes at this point in time.  Noninvasive studies do not suggest clinically significant change.  No invasive studies, angiography or surgery at this time The patient should continue walking and begin a more formal exercise program.  The patient should continue antiplatelet therapy and aggressive treatment of the lipid abnormalities  No changes in the patient's medications at this time  The patient should continue wearing graduated compression socks 10-15 mmHg strength to control the mild edema.    A total of 30 minutes was spent with this patient and greater than 50% was spent in counseling and coordination of care with the patient.  Discussion included the treatment options for vascular disease including indications for surgery and intervention.  Also discussed is the appropriate timing of treatment.  In addition medical therapy was discussed.   2. End stage renal disease ([place]) Recommend:  The patient is doing well and currently has adequate dialysis access. The patient's dialysis center is not reporting any access issues. Flow pattern is stable when compared to the prior  ultrasound.  The patient should have a duplex ultrasound of the dialysis access in 6 months.  The patient will follow-up with me in the office after each ultrasound    3. 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 Pure hypercholesterolemia Continue statin as ordered and reviewed, no changes at this time     Hortencia Pilar, MD  02/12/2017 11:56 AM

## 2017-02-15 ENCOUNTER — Ambulatory Visit (INDEPENDENT_AMBULATORY_CARE_PROVIDER_SITE_OTHER): Payer: Medicare Other

## 2017-02-15 DIAGNOSIS — N185 Chronic kidney disease, stage 5: Secondary | ICD-10-CM

## 2017-02-20 ENCOUNTER — Encounter (INDEPENDENT_AMBULATORY_CARE_PROVIDER_SITE_OTHER): Payer: Self-pay | Admitting: Vascular Surgery

## 2017-02-20 ENCOUNTER — Ambulatory Visit (INDEPENDENT_AMBULATORY_CARE_PROVIDER_SITE_OTHER): Payer: Medicare Other | Admitting: Vascular Surgery

## 2017-02-20 VITALS — BP 122/64 | HR 65 | Resp 17 | Ht 71.0 in | Wt 200.0 lb

## 2017-02-20 DIAGNOSIS — E1152 Type 2 diabetes mellitus with diabetic peripheral angiopathy with gangrene: Secondary | ICD-10-CM | POA: Diagnosis not present

## 2017-02-20 DIAGNOSIS — I70213 Atherosclerosis of native arteries of extremities with intermittent claudication, bilateral legs: Secondary | ICD-10-CM | POA: Diagnosis not present

## 2017-02-20 DIAGNOSIS — N186 End stage renal disease: Secondary | ICD-10-CM | POA: Diagnosis not present

## 2017-02-20 DIAGNOSIS — Z794 Long term (current) use of insulin: Secondary | ICD-10-CM | POA: Diagnosis not present

## 2017-02-20 DIAGNOSIS — E7800 Pure hypercholesterolemia, unspecified: Secondary | ICD-10-CM

## 2017-02-20 DIAGNOSIS — I10 Essential (primary) hypertension: Secondary | ICD-10-CM | POA: Diagnosis not present

## 2017-02-20 DIAGNOSIS — T829XXS Unspecified complication of cardiac and vascular prosthetic device, implant and graft, sequela: Secondary | ICD-10-CM | POA: Diagnosis not present

## 2017-02-20 DIAGNOSIS — T829XXA Unspecified complication of cardiac and vascular prosthetic device, implant and graft, initial encounter: Secondary | ICD-10-CM | POA: Insufficient documentation

## 2017-02-20 NOTE — Progress Notes (Signed)
MRN : [id number]  Dennis Walker is a 66 y.o. ([birth year]) male who presents with chief complaint of  Chief Complaint  Patient presents with  . Routine Post Op    2 week post op  .  History of Present Illness:   The patient returns to the office for followup of their dialysis access. The function of the access has been stable.  Previous procedure 01/31/2017: 1. Contrast injection left arm  AV access 2. Percutaneous transluminal angioplasty peripheral segment to 8 mm at the venous anastomosis and 6 mm at the arterial anastomosis 3.   Percutaneous transluminal angioplasty and stent            placement central venous segment left subclavian vein to 12      mm with a life*stent  The patient denies increased bleeding time or increased recirculation. Patient denies difficulty with cannulation. The patient denies hand pain or other symptoms consistent with steal phenomena.  No significant arm swelling.  The patient denies redness or swelling at the access site. The patient denies fever or chills at home or while on dialysis.  The patient denies amaurosis fugax or recent TIA symptoms. There are no recent neurological changes noted. The patient denies claudication symptoms or rest pain symptoms. The patient denies history of DVT, PE or superficial thrombophlebitis. The patient denies recent episodes of angina or shortness of breath.    No outpatient prescriptions have been marked as taking for the 02/20/17 encounter (Office Visit) with Delana Meyer, Dolores Lory, MD.    Past Medical History:  Diagnosis Date  . Chronic kidney disease   . Diabetes mellitus without complication ([place])   . Hyperlipidemia   . Hypertension   . Stroke [facility])     Past Surgical History:  Procedure Laterality Date  . A/V FISTULAGRAM Left 01/31/2017   Procedure: A/V Fistulagram;  Surgeon: Katha Cabal, MD;  Location: [place] [facility];  Service: Cardiovascular;  Laterality: Left;    Social History Social  History  Substance Use Topics  . Smoking status: Never Smoker  . Smokeless tobacco: Never Used  . Alcohol use No    Family History Family History  Problem Relation Age of Onset  . Diabetes Neg Hx   . Cancer Neg Hx   . Hypertension Neg Hx     Allergies  Allergen Reactions  . Cefuroxime Itching  . Shrimp [Shellfish Allergy] Other (See Comments)    Per mar   . Sulfa Antibiotics Other (See Comments)    Per mar     REVIEW OF SYSTEMS (Negative unless checked)  Constitutional: [] Weight loss  [] Fever  [] Chills Cardiac: [] Chest pain   [] Chest pressure   [] Palpitations   [] Shortness of breath when laying flat   [] Shortness of breath with exertion. Vascular:  [x] Pain in legs with walking   [] Pain in legs at rest  [] History of DVT   [] Phlebitis   [] Swelling in legs   [] Varicose veins   [] Non-healing ulcers Pulmonary:   [] Uses home oxygen   [] Productive cough   [] Hemoptysis   [] Wheeze  [] COPD   [] Asthma Neurologic:  [] Dizziness   [] Seizures   [] History of stroke   [] History of TIA  [] Aphasia   [] Vissual changes   [] Weakness or numbness in arm   [] Weakness or numbness in leg Musculoskeletal:   [] Joint swelling   [] Joint pain   [] Low back pain Hematologic:  [] Easy bruising  [] Easy bleeding   [] Hypercoagulable state   [] Anemic Gastrointestinal:  [] Diarrhea   []   Vomiting  [] Gastroesophageal reflux/heartburn   [] Difficulty swallowing. Genitourinary:  [x] Chronic kidney disease   [] Difficult urination  [] Frequent urination   [] Blood in urine Skin:  [] Rashes   [] Ulcers  Psychological:  [] History of anxiety   []  History of major depression.  Physical Examination  Vitals:   02/20/17 1010  BP: 122/64  Pulse: 65  Resp: 17  Weight: 200 lb (90.7 kg)  Height: 5\' 11"  (1.803 m)   Body mass index is 27.89 kg/m. Gen: WD/WN, NAD Head: [place]/AT, No temporalis wasting.  Ear/Nose/Throat: Hearing grossly intact, nares w/o erythema or drainage Eyes: PER, EOMI, sclera nonicteric.  Neck: Supple, no large  masses.   Pulmonary:  Good air movement, no audible wheezing bilaterally, no use of accessory muscles.  Cardiac: RRR, no JVD Vascular:  Left AV graft good bruit good thrill skin is intact; BKA on right well healed Vessel Right Left  Radial Palpable Palpable  Brachial Palpable Palpable  Gastrointestinal: Non-distended. No guarding/no peritoneal signs.  Musculoskeletal: M/S 5/5 throughout.  No deformity or atrophy.  Neurologic: CN 2-12 intact. Symmetrical.  Speech is fluent. Motor exam as listed above. Psychiatric: Judgment intact, Mood & affect appropriate for pt's clinical situation. Dermatologic: No rashes or ulcers noted.  No changes consistent with cellulitis. Lymph : No lichenification or skin changes of chronic lymphedema.  CBC Lab Results  Component Value Date   WBC 7.4 02/03/2017   HGB 13.2 02/03/2017   HCT 39.6 (L) 02/03/2017   MCV 92.8 02/03/2017   PLT 167 02/03/2017    BMET    Component Value Date/Time   NA 133 (L) 02/03/2017 0320   K 4.2 02/03/2017 0320   CL 90 (L) 02/03/2017 0320   CO2 29 02/03/2017 0320   GLUCOSE 120 (H) 02/03/2017 0320   BUN 58 (H) 02/03/2017 0320   CREATININE 8.81 (H) 02/03/2017 0320   CALCIUM 8.1 (L) 02/03/2017 0320   GFRNONAA 6 (L) 02/03/2017 0320   GFRAA 6 (L) 02/03/2017 0320   Estimated Creatinine Clearance: 9.6 mL/min (A) (by C-G formula based on SCr of 8.81 mg/dL (H)).  COAG No results found for: INR, PROTIME  Radiology Ct Head Wo Contrast  Result Date: 02/02/2017 CLINICAL DATA:  Altered mental status and generalized weakness occurred during or after dialysis. Stroke risk factors include diabetes, hypertension, and hypercholesterolemia as well as history of previous stroke. EXAM: CT HEAD WITHOUT CONTRAST TECHNIQUE: Contiguous axial images were obtained from the base of the skull through the vertex without intravenous contrast. COMPARISON:  None. FINDINGS: Brain: No evidence for acute infarction, hemorrhage, mass lesion, hydrocephalus,  or extra-axial fluid. Generalized atrophy. Hypoattenuation of white matter, likely small vessel disease. Evidence of chronic posterior circulation ischemia, with brain substance loss affecting the LEFT cerebellum, also and focal well-defined hypodensity involving the RIGHT paramedian pons. Additional focus of hypoattenuation involves the RIGHT middle cerebellar peduncle, probably chronic as well. No definite acute cortical or deep white matter infarcts. Vascular: Calcification of the cavernous internal carotid arteries, vertebral arteries, and basilar artery consistent with cerebrovascular atherosclerotic disease. No signs of intracranial large vessel occlusion. Skull: Normal. Negative for fracture or focal lesion. Sinuses/Orbits: No acute finding. Phthisis bulbi, RIGHT. LEFT cataract extraction. Other: None. IMPRESSION: Atrophy and small vessel disease. Evidence for significant remote posterior circulation ischemic change. If concern for acute infarction, and no contraindications, MRI of the brain without contrast recommended. Cerebrovascular atherosclerotic disease. No signs of intracranial large vessel occlusion. Electronically Signed   By: Staci Righter M.D.   On: 02/02/2017 15:04  Mr Brain Wo Contrast  Result Date: 02/03/2017 CLINICAL DATA:  Altered mental status during dialysis. EXAM: MRI HEAD WITHOUT CONTRAST TECHNIQUE: Multiplanar, multiecho pulse sequences of the brain and surrounding structures were obtained without intravenous contrast. COMPARISON:  CT 02/02/2017 FINDINGS: Brain: Diffusion imaging does not show any acute or subacute infarction. Extensive old small vessel infarctions throughout the brainstem. Extensive old cerebellar infarctions left more than right. Old small vessel infarctions affecting the thalami, basal ganglia and hemispheric deep white matter. Old right frontal infarction or gliosis secondary to previous ventriculostomy. No sign of mass lesion, recent hemorrhage, hydrocephalus or  extra-axial collection. Vascular: Major vessels at the base of the brain show flow. Skull and upper cervical spine: Negative Sinuses/Orbits: Sinuses clear. 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 Small amount of mastoid fluid on the left. Other: None significant IMPRESSION: No acute finding by MRI. Extensive old ischemic changes throughout the brain, particularly affecting the brainstem and cerebellum, as outlined above. Electronically Signed   By: Nelson Chimes M.D.   On: 02/03/2017 10:50     Assessment/Plan  1. End stage renal disease ([place]) Continue dialysis without missing any sessions   2. Complication from renal dialysis device, sequela Recommend:  The patient is doing well and currently has adequate dialysis access. The patient's dialysis center is not reporting any access issues. Flow pattern is stable when compared to the prior ultrasound.  The patient should have a duplex ultrasound of the dialysis access in 6 months.  The patient will follow-up with me in the office after each ultrasound    - VAS [place] (AVF,AVG); Future  3. Atherosclerosis of native artery of both lower extremities with intermittent claudication (HCC)  Recommend:  The patient has evidence of atherosclerosis of the lower extremities with claudication.  The patient does not voice lifestyle limiting changes at this point in time.  Noninvasive studies do not suggest clinically significant change.  No invasive studies, angiography or surgery at this time The patient should continue walking and begin a more formal exercise program.  The patient should continue antiplatelet therapy and aggressive treatment of the lipid abnormalities  No changes in the patient's medications at this time  The patient should continue wearing graduated compression socks 10-15 mmHg strength to control the mild edema.    4. Type 2 diabetes mellitus with diabetic peripheral angiopathy and gangrene, with long-term  current use of insulin (HCC) Continue hypoglycemic medications as already ordered, these medications have been reviewed and there are no changes at this time.  Hgb A1C to be monitored as already arranged by primary service   5. Essential hypertension Continue antihypertensive medications as already ordered, these medications have been reviewed and there are no changes at this time.   6. Pure hypercholesterolemia Continue statin as ordered and reviewed, no changes at this time    Hortencia Pilar, MD  02/20/2017 10:42 AM

## 2017-07-03 ENCOUNTER — Encounter (INDEPENDENT_AMBULATORY_CARE_PROVIDER_SITE_OTHER): Payer: Self-pay

## 2017-07-05 ENCOUNTER — Other Ambulatory Visit (INDEPENDENT_AMBULATORY_CARE_PROVIDER_SITE_OTHER): Payer: Self-pay | Admitting: Vascular Surgery

## 2017-07-06 MED ORDER — CLINDAMYCIN PHOSPHATE 300 MG/50ML IV SOLN
300.0000 mg | Freq: Once | INTRAVENOUS | Status: AC
  Administered 2017-07-07: 300 mg via INTRAVENOUS

## 2017-07-07 ENCOUNTER — Encounter
Admission: RE | Disposition: A | Discharge status: Home / Self Care | Payer: Self-pay | Source: Ambulatory Visit | Attending: Vascular Surgery

## 2017-07-07 ENCOUNTER — Ambulatory Visit
Admission: RE | Admit: 2017-07-07 | Discharge: 2017-07-07 | Disposition: A | Discharge status: Home / Self Care | Payer: Medicare Other | Source: Ambulatory Visit | Attending: Vascular Surgery | Admitting: Vascular Surgery

## 2017-07-07 ENCOUNTER — Encounter: Payer: Self-pay | Admitting: *Deleted

## 2017-07-07 DIAGNOSIS — Z882 Allergy status to sulfonamides status: Secondary | ICD-10-CM | POA: Insufficient documentation

## 2017-07-07 DIAGNOSIS — I2510 Atherosclerotic heart disease of native coronary artery without angina pectoris: Secondary | ICD-10-CM

## 2017-07-07 DIAGNOSIS — Z89511 Acquired absence of right leg below knee: Secondary | ICD-10-CM | POA: Diagnosis not present

## 2017-07-07 DIAGNOSIS — Z91013 Allergy to seafood: Secondary | ICD-10-CM | POA: Insufficient documentation

## 2017-07-07 DIAGNOSIS — Z8612 Personal history of poliomyelitis: Secondary | ICD-10-CM | POA: Diagnosis not present

## 2017-07-07 DIAGNOSIS — Y828 Other medical devices associated with adverse incidents: Secondary | ICD-10-CM | POA: Insufficient documentation

## 2017-07-07 DIAGNOSIS — E785 Hyperlipidemia, unspecified: Secondary | ICD-10-CM | POA: Diagnosis not present

## 2017-07-07 DIAGNOSIS — T82858A Stenosis of vascular prosthetic devices, implants and grafts, initial encounter: Secondary | ICD-10-CM | POA: Diagnosis not present

## 2017-07-07 DIAGNOSIS — E119 Type 2 diabetes mellitus without complications: Secondary | ICD-10-CM | POA: Diagnosis not present

## 2017-07-07 DIAGNOSIS — Z79899 Other long term (current) drug therapy: Secondary | ICD-10-CM | POA: Insufficient documentation

## 2017-07-07 DIAGNOSIS — I69354 Hemiplegia and hemiparesis following cerebral infarction affecting left non-dominant side: Secondary | ICD-10-CM | POA: Diagnosis not present

## 2017-07-07 DIAGNOSIS — Z7982 Long term (current) use of aspirin: Secondary | ICD-10-CM | POA: Insufficient documentation

## 2017-07-07 DIAGNOSIS — F419 Anxiety disorder, unspecified: Secondary | ICD-10-CM | POA: Insufficient documentation

## 2017-07-07 DIAGNOSIS — I132 Hypertensive heart and chronic kidney disease with heart failure and with stage 5 chronic kidney disease, or end stage renal disease: Secondary | ICD-10-CM | POA: Diagnosis present

## 2017-07-07 DIAGNOSIS — Z992 Dependence on renal dialysis: Secondary | ICD-10-CM | POA: Diagnosis not present

## 2017-07-07 DIAGNOSIS — F329 Major depressive disorder, single episode, unspecified: Secondary | ICD-10-CM | POA: Diagnosis not present

## 2017-07-07 DIAGNOSIS — Z9889 Other specified postprocedural states: Secondary | ICD-10-CM | POA: Diagnosis not present

## 2017-07-07 DIAGNOSIS — I255 Ischemic cardiomyopathy: Secondary | ICD-10-CM | POA: Diagnosis not present

## 2017-07-07 DIAGNOSIS — E1151 Type 2 diabetes mellitus with diabetic peripheral angiopathy without gangrene: Secondary | ICD-10-CM | POA: Diagnosis not present

## 2017-07-07 DIAGNOSIS — Z7401 Bed confinement status: Secondary | ICD-10-CM | POA: Insufficient documentation

## 2017-07-07 DIAGNOSIS — Z7902 Long term (current) use of antithrombotics/antiplatelets: Secondary | ICD-10-CM | POA: Insufficient documentation

## 2017-07-07 DIAGNOSIS — I252 Old myocardial infarction: Secondary | ICD-10-CM | POA: Diagnosis not present

## 2017-07-07 DIAGNOSIS — I10 Essential (primary) hypertension: Secondary | ICD-10-CM | POA: Diagnosis not present

## 2017-07-07 DIAGNOSIS — Z794 Long term (current) use of insulin: Secondary | ICD-10-CM | POA: Insufficient documentation

## 2017-07-07 DIAGNOSIS — Z823 Family history of stroke: Secondary | ICD-10-CM | POA: Insufficient documentation

## 2017-07-07 DIAGNOSIS — Z9861 Coronary angioplasty status: Secondary | ICD-10-CM | POA: Insufficient documentation

## 2017-07-07 DIAGNOSIS — N186 End stage renal disease: Secondary | ICD-10-CM

## 2017-07-07 DIAGNOSIS — I509 Heart failure, unspecified: Secondary | ICD-10-CM | POA: Insufficient documentation

## 2017-07-07 DIAGNOSIS — Z881 Allergy status to other antibiotic agents status: Secondary | ICD-10-CM | POA: Insufficient documentation

## 2017-07-07 DIAGNOSIS — E1122 Type 2 diabetes mellitus with diabetic chronic kidney disease: Secondary | ICD-10-CM | POA: Diagnosis present

## 2017-07-07 DIAGNOSIS — J45909 Unspecified asthma, uncomplicated: Secondary | ICD-10-CM | POA: Insufficient documentation

## 2017-07-07 DIAGNOSIS — T82868A Thrombosis of vascular prosthetic devices, implants and grafts, initial encounter: Secondary | ICD-10-CM

## 2017-07-07 HISTORY — PX: A/V FISTULAGRAM: CATH118298

## 2017-07-07 HISTORY — DX: Anxiety disorder, unspecified: F41.9

## 2017-07-07 HISTORY — DX: End stage renal disease: N18.6

## 2017-07-07 HISTORY — DX: Unspecified asthma, uncomplicated: J45.909

## 2017-07-07 HISTORY — DX: Peripheral vascular disease, unspecified: I73.9

## 2017-07-07 HISTORY — DX: Acute myocardial infarction, unspecified: I21.9

## 2017-07-07 HISTORY — DX: Unspecified osteoarthritis, unspecified site: M19.90

## 2017-07-07 HISTORY — DX: Atherosclerotic heart disease of native coronary artery without angina pectoris: I25.10

## 2017-07-07 HISTORY — DX: Acute poliomyelitis, unspecified: A80.9

## 2017-07-07 HISTORY — DX: Dyspnea, unspecified: R06.00

## 2017-07-07 HISTORY — DX: Depression, unspecified: F32.A

## 2017-07-07 HISTORY — DX: Angina pectoris, unspecified: I20.9

## 2017-07-07 HISTORY — DX: Heart failure, unspecified: I50.9

## 2017-07-07 HISTORY — DX: Cardiac murmur, unspecified: R01.1

## 2017-07-07 HISTORY — DX: Major depressive disorder, single episode, unspecified: F32.9

## 2017-07-07 LAB — POTASSIUM (ARMC VASCULAR LAB ONLY): POTASSIUM (ARMC VASCULAR LAB): 4.3 (ref 3.5–5.1)

## 2017-07-07 LAB — GLUCOSE, CAPILLARY
Glucose-Capillary: 73 mg/dL (ref 65–99)
Glucose-Capillary: 77 mg/dL (ref 65–99)

## 2017-07-07 SURGERY — A/V FISTULAGRAM
Anesthesia: Moderate Sedation

## 2017-07-07 MED ORDER — LIDOCAINE HCL (PF) 1 % IJ SOLN
INTRAMUSCULAR | Status: AC
  Filled 2017-07-07: qty 30

## 2017-07-07 MED ORDER — CLINDAMYCIN PHOSPHATE 300 MG/50ML IV SOLN
INTRAVENOUS | Status: AC
  Filled 2017-07-07: qty 50

## 2017-07-07 MED ORDER — METHYLPREDNISOLONE SODIUM SUCC 125 MG IJ SOLR
INTRAMUSCULAR | Status: AC
  Administered 2017-07-07: 125 mg via INTRAVENOUS
  Filled 2017-07-07: qty 2

## 2017-07-07 MED ORDER — LIDOCAINE HCL (PF) 1 % IJ SOLN
INTRAMUSCULAR | Status: DC | PRN
  Administered 2017-07-07: 5 mL

## 2017-07-07 MED ORDER — HYDROMORPHONE HCL 1 MG/ML IJ SOLN: 1.0000 mg | Freq: Once | INTRAMUSCULAR | Status: DC | PRN

## 2017-07-07 MED ORDER — ONDANSETRON HCL 4 MG/2ML IJ SOLN: 4.0000 mg | Freq: Four times a day (QID) | INTRAMUSCULAR | Status: DC | PRN

## 2017-07-07 MED ORDER — IOPAMIDOL (ISOVUE-300) INJECTION 61%
INTRAVENOUS | Status: DC | PRN
  Administered 2017-07-07: 25 mL via INTRAVENOUS

## 2017-07-07 MED ORDER — HEPARIN SODIUM (PORCINE) 1000 UNIT/ML IJ SOLN
INTRAMUSCULAR | Status: AC
  Filled 2017-07-07: qty 1

## 2017-07-07 MED ORDER — METHYLPREDNISOLONE SODIUM SUCC 125 MG IJ SOLR
125.0000 mg | INTRAMUSCULAR | Status: DC | PRN
  Administered 2017-07-07: 125 mg via INTRAVENOUS

## 2017-07-07 MED ORDER — HEPARIN (PORCINE) IN NACL 2-0.9 UNIT/ML-% IJ SOLN
INTRAMUSCULAR | Status: AC
  Filled 2017-07-07: qty 1000

## 2017-07-07 MED ORDER — FAMOTIDINE 20 MG PO TABS
ORAL_TABLET | ORAL | Status: AC
  Administered 2017-07-07: 40 mg via ORAL
  Filled 2017-07-07: qty 2

## 2017-07-07 MED ORDER — FAMOTIDINE 20 MG PO TABS
40.0000 mg | ORAL_TABLET | ORAL | Status: DC | PRN
  Administered 2017-07-07: 40 mg via ORAL

## 2017-07-07 MED ORDER — SODIUM CHLORIDE 0.9 % IV SOLN
INTRAVENOUS | Status: DC
  Administered 2017-07-07: 09:00:00 via INTRAVENOUS

## 2017-07-07 MED ORDER — FENTANYL CITRATE (PF) 100 MCG/2ML IJ SOLN
INTRAMUSCULAR | Status: AC
  Filled 2017-07-07: qty 2

## 2017-07-07 MED ORDER — FENTANYL CITRATE (PF) 100 MCG/2ML IJ SOLN
INTRAMUSCULAR | Status: DC | PRN
  Administered 2017-07-07 (×2): 50 ug via INTRAVENOUS

## 2017-07-07 MED ORDER — HEPARIN SODIUM (PORCINE) 1000 UNIT/ML IJ SOLN
INTRAMUSCULAR | Status: DC | PRN
  Administered 2017-07-07: 3000 [IU] via INTRAVENOUS

## 2017-07-07 MED ORDER — MIDAZOLAM HCL 5 MG/5ML IJ SOLN
INTRAMUSCULAR | Status: AC
  Filled 2017-07-07: qty 5

## 2017-07-07 MED ORDER — MIDAZOLAM HCL 2 MG/2ML IJ SOLN
INTRAMUSCULAR | Status: DC | PRN
  Administered 2017-07-07: 1 mg via INTRAVENOUS
  Administered 2017-07-07: 2 mg via INTRAVENOUS

## 2017-07-07 MED ORDER — FENTANYL CITRATE (PF) 100 MCG/2ML IJ SOLN
INTRAMUSCULAR | Status: AC
Start: 2017-07-07 — End: 2017-07-07
  Filled 2017-07-07: qty 2

## 2017-07-07 SURGICAL SUPPLY — 16 items
BALLN DORADO 8X40X80 (BALLOONS) ×3
BALLN LUTONIX AV 10X40X75 (BALLOONS) ×3
BALLOON DORADO 8X40X80 (BALLOONS) ×1 IMPLANT
BALLOON LUTONIX AV 10X40X75 (BALLOONS) ×1 IMPLANT
DEVICE PRESTO INFLATION (MISCELLANEOUS) ×3 IMPLANT
DEVICE TORQUE (MISCELLANEOUS) ×3 IMPLANT
DRAPE BRACHIAL (DRAPES) ×3 IMPLANT
GUIDEWIRE ANGLED .035 180CM (WIRE) ×3 IMPLANT
NEEDLE ENTRY 21GA 7CM ECHOTIP (NEEDLE) ×3 IMPLANT
PACK ANGIOGRAPHY (CUSTOM PROCEDURE TRAY) ×3 IMPLANT
SET INTRO CAPELLA COAXIAL (SET/KITS/TRAYS/PACK) ×3 IMPLANT
SHEATH BRITE TIP 6FRX5.5 (SHEATH) ×3 IMPLANT
SHEATH BRITE TIP 7FRX5.5 (SHEATH) ×3 IMPLANT
STENT COVERA FLARED 8X40X80 (Permanent Stent) ×3 IMPLANT
SUT MNCRL AB 4-0 PS2 18 (SUTURE) ×3 IMPLANT
WIRE MAGIC TOR.035 180C (WIRE) ×3 IMPLANT
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
## 2017-07-07 NOTE — OR Nursing (Signed)
Pt reports he has had chest pain intermittantly last couple days, relieved with sl NTG. Currently not having chest pain. Dr Delana Meyer notified by [facility] staff. Received order for med consult and 12 lead EKG.

## 2017-07-07 NOTE — Op Note (Signed)
OPERATIVE NOTE   PROCEDURE: 1. Contrast injection left arm AV access 2. Percutaneous transluminal angioplasty and stent placement at the venous anastomosis left arm AV access 3. Percutaneous transluminal angioplasty of the left innominate vein to 10 mm with a Lutonix drug-eluting balloon  PRE-OPERATIVE DIAGNOSIS: Complication of dialysis access                                                       End Stage Renal Disease  POST-OPERATIVE DIAGNOSIS: same as above   SURGEON: Katha Cabal, M.D.  ANESTHESIA: Conscious sedation was administered under my direct supervision by the interventional radiology RN. IV Versed plus fentanyl were utilized. Continuous ECG, pulse oximetry and blood pressure was monitored throughout the entire procedure.  Conscious sedation was for a total of 29.  ESTIMATED BLOOD LOSS: minimal  FINDING(S): 1. 70% in-stent restenosis in the innominate stent 2. Greater than 90% stenosis at the venous anastomosis of the AV graft. 3. 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 male who  presents with malfunctioning left AV access.  The patient is scheduled for angiography with possible intervention of the AV access.  The patient is aware the risks include but are not limited to: bleeding, infection, thrombosis of the cannulated access, and possible anaphylactic reaction to the contrast.  The patient acknowledges if the access can not be salvaged a tunneled catheter will be needed and will be placed during this procedure.  The patient is aware of the risks of the procedure and elects to proceed with the angiogram and intervention.  DESCRIPTION: After full informed written consent was obtained, the patient was brought back to the Special Procedure suite and placed supine position.  Appropriate cardiopulmonary monitors were placed.  The left arm was prepped and  draped in the standard fashion.  Appropriate timeout is called. The left AV access was cannulated with a micropuncture needle.  Cannulation was performed with ultrasound guidance. Ultrasound was placed in a sterile sleeve, the AV access was interrogated and noted to be echolucent and compressible indicating patency. Image was recorded for the permanent record. The puncture is performed under continuous ultrasound visualization.   The microwire was advanced and the needle was exchanged for  a microsheath.  The J-wire was then advanced and a 6 Fr sheath inserted.  Hand injections were completed to image the access from the arterial anastomosis through the entire access.  The central venous structures were also imaged by hand injections.  Based on the images, the sheath was upsized to a 7 [place] sheath and 3000 units of heparin was given and a Magic torque wire was negotiated through the strictures within the venous portion of the graft and the central portion.  An 8 mm x 40 mm flared Covera stent was deployed across the stenoses and postdilated with an 8 mm [place] balloon.  A 10 mm x 40 mm Lutonix drug-eluting balloon was then advanced across the in-stent restenosis and inflated to 14 atm for 1-1/2 minutes.  Follow-up imaging of both the sites demonstrated less than 10% residual stenosis.  Follow-up imaging demonstrates complete resolution of the stricture with rapid flow of contrast through the graft, the central venous anatomy is preserved.  Of note of  the pseudoaneurysm is located near the arterial anastomosis.  This does not appear to be flow limiting or a problem at this time I will obtain an HDA to better assess it and decide whether it should be treated.  A 4-0 Monocryl purse-string suture was sewn around the sheath.  The sheath was removed and light pressure was applied.  A sterile bandage was applied to the puncture site.    COMPLICATIONS: None  CONDITION: [name],  M.D [facility] Office: [phone]  07/07/2017 12:03 PM

## 2017-07-07 NOTE — Discharge Instructions (Signed)

## 2017-07-10 ENCOUNTER — Encounter: Payer: Self-pay | Admitting: Vascular Surgery
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
## 2017-07-31 ENCOUNTER — Encounter (INDEPENDENT_AMBULATORY_CARE_PROVIDER_SITE_OTHER): Payer: Self-pay | Admitting: Vascular Surgery

## 2017-07-31 ENCOUNTER — Ambulatory Visit (INDEPENDENT_AMBULATORY_CARE_PROVIDER_SITE_OTHER): Payer: Medicare Other | Admitting: Vascular Surgery

## 2017-07-31 VITALS — BP 97/60 | HR 53 | Resp 16 | Ht 70.0 in

## 2017-07-31 DIAGNOSIS — I70213 Atherosclerosis of native arteries of extremities with intermittent claudication, bilateral legs: Secondary | ICD-10-CM

## 2017-07-31 DIAGNOSIS — M24541 Contracture, right hand: Secondary | ICD-10-CM | POA: Diagnosis not present

## 2017-07-31 DIAGNOSIS — Z794 Long term (current) use of insulin: Secondary | ICD-10-CM

## 2017-07-31 DIAGNOSIS — M24549 Contracture, unspecified hand: Secondary | ICD-10-CM | POA: Insufficient documentation

## 2017-07-31 DIAGNOSIS — E1152 Type 2 diabetes mellitus with diabetic peripheral angiopathy with gangrene: Secondary | ICD-10-CM | POA: Diagnosis not present

## 2017-07-31 DIAGNOSIS — T829XXS Unspecified complication of cardiac and vascular prosthetic device, implant and graft, sequela: Secondary | ICD-10-CM | POA: Diagnosis not present

## 2017-07-31 DIAGNOSIS — E7800 Pure hypercholesterolemia, unspecified: Secondary | ICD-10-CM

## 2017-07-31 DIAGNOSIS — I10 Essential (primary) hypertension: Secondary | ICD-10-CM

## 2017-07-31 DIAGNOSIS — N186 End stage renal disease: Secondary | ICD-10-CM

## 2017-08-04 ENCOUNTER — Ambulatory Visit (INDEPENDENT_AMBULATORY_CARE_PROVIDER_SITE_OTHER): Payer: Medicare Other | Admitting: Vascular Surgery

## 2017-08-05 ENCOUNTER — Other Ambulatory Visit: Payer: Self-pay

## 2017-08-05 ENCOUNTER — Inpatient Hospital Stay: Payer: Medicare Other

## 2017-08-05 ENCOUNTER — Observation Stay
Admission: EM | Admit: 2017-08-05 | Discharge: 2017-08-06 | Disposition: A | Discharge status: Skilled Nursing Facility | Payer: Medicare Other | Attending: Internal Medicine | Admitting: Internal Medicine

## 2017-08-05 DIAGNOSIS — Z794 Long term (current) use of insulin: Secondary | ICD-10-CM | POA: Insufficient documentation

## 2017-08-05 DIAGNOSIS — I5032 Chronic diastolic (congestive) heart failure: Secondary | ICD-10-CM | POA: Diagnosis not present

## 2017-08-05 DIAGNOSIS — Z89511 Acquired absence of right leg below knee: Secondary | ICD-10-CM | POA: Insufficient documentation

## 2017-08-05 DIAGNOSIS — N2581 Secondary hyperparathyroidism of renal origin: Secondary | ICD-10-CM | POA: Diagnosis not present

## 2017-08-05 DIAGNOSIS — J449 Chronic obstructive pulmonary disease, unspecified: Secondary | ICD-10-CM | POA: Insufficient documentation

## 2017-08-05 DIAGNOSIS — N186 End stage renal disease: Secondary | ICD-10-CM | POA: Diagnosis not present

## 2017-08-05 DIAGNOSIS — R531 Weakness: Secondary | ICD-10-CM | POA: Insufficient documentation

## 2017-08-05 DIAGNOSIS — Z7902 Long term (current) use of antithrombotics/antiplatelets: Secondary | ICD-10-CM | POA: Diagnosis not present

## 2017-08-05 DIAGNOSIS — E1151 Type 2 diabetes mellitus with diabetic peripheral angiopathy without gangrene: Secondary | ICD-10-CM | POA: Diagnosis not present

## 2017-08-05 DIAGNOSIS — Z7982 Long term (current) use of aspirin: Secondary | ICD-10-CM | POA: Diagnosis not present

## 2017-08-05 DIAGNOSIS — I959 Hypotension, unspecified: Secondary | ICD-10-CM | POA: Diagnosis not present

## 2017-08-05 DIAGNOSIS — E785 Hyperlipidemia, unspecified: Secondary | ICD-10-CM | POA: Insufficient documentation

## 2017-08-05 DIAGNOSIS — R001 Bradycardia, unspecified: Secondary | ICD-10-CM | POA: Insufficient documentation

## 2017-08-05 DIAGNOSIS — E1122 Type 2 diabetes mellitus with diabetic chronic kidney disease: Secondary | ICD-10-CM | POA: Diagnosis not present

## 2017-08-05 DIAGNOSIS — Z992 Dependence on renal dialysis: Secondary | ICD-10-CM | POA: Diagnosis not present

## 2017-08-05 DIAGNOSIS — R7989 Other specified abnormal findings of blood chemistry: Secondary | ICD-10-CM | POA: Insufficient documentation

## 2017-08-05 DIAGNOSIS — I132 Hypertensive heart and chronic kidney disease with heart failure and with stage 5 chronic kidney disease, or end stage renal disease: Secondary | ICD-10-CM | POA: Diagnosis not present

## 2017-08-05 DIAGNOSIS — F329 Major depressive disorder, single episode, unspecified: Secondary | ICD-10-CM | POA: Diagnosis not present

## 2017-08-05 DIAGNOSIS — I2510 Atherosclerotic heart disease of native coronary artery without angina pectoris: Secondary | ICD-10-CM | POA: Insufficient documentation

## 2017-08-05 DIAGNOSIS — R140 Abdominal distension (gaseous): Secondary | ICD-10-CM | POA: Diagnosis not present

## 2017-08-05 DIAGNOSIS — F419 Anxiety disorder, unspecified: Secondary | ICD-10-CM | POA: Diagnosis not present

## 2017-08-05 DIAGNOSIS — I252 Old myocardial infarction: Secondary | ICD-10-CM | POA: Diagnosis not present

## 2017-08-05 DIAGNOSIS — R748 Abnormal levels of other serum enzymes: Secondary | ICD-10-CM | POA: Diagnosis present

## 2017-08-05 DIAGNOSIS — M1990 Unspecified osteoarthritis, unspecified site: Secondary | ICD-10-CM | POA: Diagnosis not present

## 2017-08-05 DIAGNOSIS — Z8673 Personal history of transient ischemic attack (TIA), and cerebral infarction without residual deficits: Secondary | ICD-10-CM | POA: Insufficient documentation

## 2017-08-05 DIAGNOSIS — R778 Other specified abnormalities of plasma proteins: Secondary | ICD-10-CM | POA: Diagnosis present

## 2017-08-05 DIAGNOSIS — D631 Anemia in chronic kidney disease: Secondary | ICD-10-CM | POA: Diagnosis not present

## 2017-08-05 DIAGNOSIS — Z882 Allergy status to sulfonamides status: Secondary | ICD-10-CM | POA: Insufficient documentation

## 2017-08-05 LAB — COMPREHENSIVE METABOLIC PANEL
ALBUMIN: 3.7 g/dL (ref 3.5–5.0)
ALT: 21 U/L (ref 17–63)
AST: 27 U/L (ref 15–41)
Alkaline Phosphatase: 79 U/L (ref 38–126)
Anion gap: 10 (ref 5–15)
BUN: 14 mg/dL (ref 6–20)
CHLORIDE: 96 mmol/L — AB (ref 101–111)
CO2: 29 mmol/L (ref 22–32)
Calcium: 8.1 mg/dL — ABNORMAL LOW (ref 8.9–10.3)
Creatinine, Ser: 4.18 mg/dL — ABNORMAL HIGH (ref 0.61–1.24)
GFR calc Af Amer: 16 mL/min — ABNORMAL LOW (ref >60)
GFR calc non Af Amer: 14 mL/min — ABNORMAL LOW (ref >60)
GLUCOSE: 152 mg/dL — AB (ref 65–99)
POTASSIUM: 3.7 mmol/L (ref 3.5–5.1)
SODIUM: 135 mmol/L (ref 135–145)
Total Bilirubin: 0.5 mg/dL (ref 0.3–1.2)
Total Protein: 8.1 g/dL (ref 6.5–8.1)

## 2017-08-05 LAB — LIPID PANEL
Cholesterol: 62 mg/dL (ref 0–200)
HDL: 34 mg/dL — ABNORMAL LOW (ref >40)
LDL Cholesterol: 13 mg/dL (ref 0–99)
Total CHOL/HDL Ratio: 1.8 RATIO
Triglycerides: 74 mg/dL (ref <150)
VLDL: 15 mg/dL (ref 0–40)

## 2017-08-05 LAB — CBC
HEMATOCRIT: 34.1 % — AB (ref 40.0–52.0)
Hemoglobin: 11.2 g/dL — ABNORMAL LOW (ref 13.0–18.0)
MCH: 31.6 pg (ref 26.0–34.0)
MCHC: 32.8 g/dL (ref 32.0–36.0)
MCV: 96.2 fL (ref 80.0–100.0)
Platelets: 219 10*3/uL (ref 150–440)
RBC: 3.54 MIL/uL — ABNORMAL LOW (ref 4.40–5.90)
RDW: 16.7 % — AB (ref 11.5–14.5)
WBC: 9 10*3/uL (ref 3.8–10.6)

## 2017-08-05 LAB — MRSA PCR SCREENING: MRSA by PCR: NEGATIVE

## 2017-08-05 LAB — GLUCOSE, CAPILLARY
GLUCOSE-CAPILLARY: 142 mg/dL — AB (ref 65–99)
GLUCOSE-CAPILLARY: 183 mg/dL — AB (ref 65–99)

## 2017-08-05 LAB — APTT: APTT: 43 s — AB (ref 24–36)

## 2017-08-05 LAB — HEPARIN LEVEL (UNFRACTIONATED): HEPARIN UNFRACTIONATED: 0.27 [IU]/mL — AB (ref 0.30–0.70)

## 2017-08-05 LAB — TROPONIN I
TROPONIN I: 1.78 ng/mL — AB (ref <0.03)
Troponin I: 2.04 ng/mL (ref <0.03)

## 2017-08-05 LAB — LIPASE, BLOOD: Lipase: 23 U/L (ref 11–51)

## 2017-08-05 LAB — PROTIME-INR
INR: 1.21
PROTHROMBIN TIME: 15.2 s (ref 11.4–15.2)

## 2017-08-05 MED ORDER — HEPARIN (PORCINE) IN NACL 100-0.45 UNIT/ML-% IJ SOLN
1500.0000 [IU]/h | INTRAMUSCULAR | Status: DC
  Administered 2017-08-05: 1200 [IU]/h via INTRAVENOUS
  Administered 2017-08-06: 1500 [IU]/h via INTRAVENOUS
  Filled 2017-08-05 (×3): qty 250

## 2017-08-05 MED ORDER — HEPARIN BOLUS VIA INFUSION
4000.0000 [IU] | Freq: Once | INTRAVENOUS | Status: AC
  Administered 2017-08-05: 4000 [IU] via INTRAVENOUS
  Filled 2017-08-05: qty 4000

## 2017-08-05 MED ORDER — RANOLAZINE ER 500 MG PO TB12
500.0000 mg | ORAL_TABLET | Freq: Two times a day (BID) | ORAL | Status: DC
  Administered 2017-08-05 – 2017-08-06 (×2): 500 mg via ORAL
  Filled 2017-08-05 (×3): qty 1

## 2017-08-05 MED ORDER — OXYCODONE HCL 5 MG PO TABS
5.0000 mg | ORAL_TABLET | Freq: Four times a day (QID) | ORAL | Status: DC | PRN
  Administered 2017-08-05: 10 mg via ORAL
  Filled 2017-08-05: qty 2

## 2017-08-05 MED ORDER — ACETAMINOPHEN 500 MG PO TABS: 500.0000 mg | ORAL_TABLET | ORAL | Status: DC | PRN

## 2017-08-05 MED ORDER — TRAZODONE HCL 50 MG PO TABS
50.0000 mg | ORAL_TABLET | Freq: Every day | ORAL | Status: DC
  Administered 2017-08-05: 50 mg via ORAL
  Filled 2017-08-05: qty 1

## 2017-08-05 MED ORDER — TRAMADOL HCL 50 MG PO TABS: 50.0000 mg | ORAL_TABLET | Freq: Four times a day (QID) | ORAL | Status: DC | PRN

## 2017-08-05 MED ORDER — MELATONIN 5 MG PO TABS
5.0000 mg | ORAL_TABLET | Freq: Every day | ORAL | Status: DC
  Administered 2017-08-05: 5 mg via ORAL
  Filled 2017-08-05 (×2): qty 1

## 2017-08-05 MED ORDER — ONDANSETRON HCL 4 MG PO TABS: 4.0000 mg | ORAL_TABLET | Freq: Four times a day (QID) | ORAL | Status: DC | PRN

## 2017-08-05 MED ORDER — ACETAMINOPHEN 650 MG RE SUPP: 650.0000 mg | Freq: Four times a day (QID) | RECTAL | Status: DC | PRN

## 2017-08-05 MED ORDER — POLYETHYLENE GLYCOL 3350 17 G PO PACK: 17.0000 g | PACK | Freq: Every day | ORAL | Status: DC | PRN

## 2017-08-05 MED ORDER — SENNA 8.6 MG PO TABS
8.6000 mg | ORAL_TABLET | Freq: Two times a day (BID) | ORAL | Status: DC
  Administered 2017-08-05 – 2017-08-06 (×2): 8.6 mg via ORAL
  Filled 2017-08-05 (×2): qty 1

## 2017-08-05 MED ORDER — SEVELAMER CARBONATE 800 MG PO TABS
1600.0000 mg | ORAL_TABLET | Freq: Three times a day (TID) | ORAL | Status: DC
  Administered 2017-08-06 (×2): 1600 mg via ORAL
  Filled 2017-08-05 (×2): qty 2

## 2017-08-05 MED ORDER — NITROGLYCERIN 0.4 MG SL SUBL: 0.4000 mg | SUBLINGUAL_TABLET | SUBLINGUAL | Status: DC | PRN

## 2017-08-05 MED ORDER — LISINOPRIL 5 MG PO TABS
5.0000 mg | ORAL_TABLET | Freq: Every evening | ORAL | Status: DC
Start: 2017-08-05 — End: 2017-08-06

## 2017-08-05 MED ORDER — LIDOCAINE-PRILOCAINE 2.5-2.5 % EX CREA
1.0000 "application " | TOPICAL_CREAM | CUTANEOUS | Status: DC
  Filled 2017-08-05: qty 5

## 2017-08-05 MED ORDER — BUMETANIDE 2 MG PO TABS
2.0000 mg | ORAL_TABLET | Freq: Two times a day (BID) | ORAL | Status: DC
  Administered 2017-08-05 – 2017-08-06 (×2): 2 mg via ORAL
  Filled 2017-08-05 (×3): qty 1

## 2017-08-05 MED ORDER — LACTULOSE 10 GM/15ML PO SOLN
20.0000 g | Freq: Every day | ORAL | Status: DC
  Filled 2017-08-05: qty 30

## 2017-08-05 MED ORDER — MELATONIN 3 MG PO TABS
6.0000 mg | ORAL_TABLET | Freq: Every day | ORAL | Status: DC
  Filled 2017-08-05: qty 2

## 2017-08-05 MED ORDER — METOPROLOL SUCCINATE ER 100 MG PO TB24: 100.0000 mg | ORAL_TABLET | Freq: Every day | ORAL | Status: DC

## 2017-08-05 MED ORDER — ATORVASTATIN CALCIUM 20 MG PO TABS
40.0000 mg | ORAL_TABLET | Freq: Every day | ORAL | Status: DC
  Administered 2017-08-05: 40 mg via ORAL
  Filled 2017-08-05: qty 2

## 2017-08-05 MED ORDER — GABAPENTIN 300 MG PO CAPS
300.0000 mg | ORAL_CAPSULE | Freq: Every day | ORAL | Status: DC
  Administered 2017-08-05: 300 mg via ORAL
  Filled 2017-08-05: qty 1

## 2017-08-05 MED ORDER — ACETAMINOPHEN 325 MG PO TABS: 650.0000 mg | ORAL_TABLET | Freq: Four times a day (QID) | ORAL | Status: DC | PRN

## 2017-08-05 MED ORDER — ASPIRIN EC 81 MG PO TBEC
81.0000 mg | DELAYED_RELEASE_TABLET | Freq: Every day | ORAL | Status: DC
  Administered 2017-08-05: 81 mg via ORAL
  Filled 2017-08-05: qty 1

## 2017-08-05 MED ORDER — INSULIN ASPART 100 UNIT/ML ~~LOC~~ SOLN
0.0000 [IU] | Freq: Three times a day (TID) | SUBCUTANEOUS | Status: DC
Start: 2017-08-06 — End: 2017-08-06

## 2017-08-05 MED ORDER — ONDANSETRON HCL 4 MG/2ML IJ SOLN: 4.0000 mg | Freq: Four times a day (QID) | INTRAMUSCULAR | Status: DC | PRN

## 2017-08-05 MED ORDER — HYDROCERIN EX CREA
1.0000 "application " | TOPICAL_CREAM | Freq: Two times a day (BID) | CUTANEOUS | Status: DC | PRN
  Filled 2017-08-05: qty 113

## 2017-08-05 MED ORDER — SODIUM CHLORIDE 0.9% FLUSH: 3.0000 mL | INTRAVENOUS | Status: DC | PRN

## 2017-08-05 MED ORDER — POLYETHYLENE GLYCOL 3350 17 G PO PACK
17.0000 g | PACK | Freq: Every day | ORAL | Status: DC
  Administered 2017-08-05: 17 g via ORAL
  Filled 2017-08-05: qty 1

## 2017-08-05 MED ORDER — SERTRALINE HCL 50 MG PO TABS
100.0000 mg | ORAL_TABLET | Freq: Every day | ORAL | Status: DC
  Administered 2017-08-05: 100 mg via ORAL
  Filled 2017-08-05: qty 2

## 2017-08-05 MED ORDER — SODIUM CHLORIDE 0.9% FLUSH
3.0000 mL | Freq: Two times a day (BID) | INTRAVENOUS | Status: DC
  Administered 2017-08-06: 3 mL via INTRAVENOUS

## 2017-08-05 MED ORDER — BISACODYL 10 MG RE SUPP: 10.0000 mg | Freq: Every day | RECTAL | Status: DC | PRN

## 2017-08-05 MED ORDER — VITAMIN B-12 1000 MCG PO TABS
1000.0000 ug | ORAL_TABLET | Freq: Every day | ORAL | Status: DC
  Administered 2017-08-05: 1000 ug via ORAL
  Filled 2017-08-05 (×3): qty 1

## 2017-08-05 MED ORDER — CLOPIDOGREL BISULFATE 75 MG PO TABS
75.0000 mg | ORAL_TABLET | Freq: Every day | ORAL | Status: DC
  Administered 2017-08-05: 75 mg via ORAL
  Filled 2017-08-05: qty 1

## 2017-08-05 MED ORDER — SODIUM CHLORIDE 0.9 % IV SOLN: 250.0000 mL | INTRAVENOUS | Status: DC | PRN

## 2017-08-05 MED ORDER — FERROUS SULFATE 325 (65 FE) MG PO TABS
325.0000 mg | ORAL_TABLET | Freq: Two times a day (BID) | ORAL | Status: DC
  Administered 2017-08-05 – 2017-08-06 (×2): 325 mg via ORAL
  Filled 2017-08-05 (×2): qty 1

## 2017-08-05 MED ORDER — HEPARIN BOLUS VIA INFUSION
1400.0000 [IU] | Freq: Once | INTRAVENOUS | Status: AC
  Administered 2017-08-05: 1400 [IU] via INTRAVENOUS
  Filled 2017-08-05: qty 1400

## 2017-08-05 MED ORDER — GABAPENTIN 100 MG PO CAPS
100.0000 mg | ORAL_CAPSULE | Freq: Every evening | ORAL | Status: DC
  Administered 2017-08-05: 100 mg via ORAL
  Filled 2017-08-05 (×2): qty 1

## 2017-08-05 MED ORDER — INSULIN DETEMIR 100 UNIT/ML ~~LOC~~ SOLN
29.0000 [IU] | Freq: Every day | SUBCUTANEOUS | Status: DC
  Administered 2017-08-05: 29 [IU] via SUBCUTANEOUS
  Filled 2017-08-05 (×2): qty 0.29

## 2017-08-05 NOTE — ED Triage Notes (Signed)
Pt comes via [facility] from [facility] after dialysis treatment. Pt was hypotensive and bradycardic. Pt has dialysis Tuesday, Thursday and Saturday. Pt was also hypotensive prior to dialysis. Pt is alert and oriented. Respirations even and unlabored.
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
## 2017-08-05 NOTE — ED Provider Notes (Signed)
Notified of elevated troponin of 2.04, discussed with Dr. Lovena Le of cardiology he recommends no heparin given the patient's not having any chest pain.  ED ECG REPORT I, Lavonia Drafts, the attending physician, personally viewed and interpreted this ECG.  Date: 08/05/2017  Rate: 61 Rhythm: normal sinus rhythm QRS Axis: normal Intervals: normal ST/T Wave abnormalities: Lateral T wave abnormalities  Will admit for observation to the hospitalist service   Lavonia Drafts, MD 08/05/17 1547

## 2017-08-05 NOTE — Progress Notes (Signed)
ANTICOAGULATION CONSULT NOTE - Initial Consult  Pharmacy Consult for heparin Indication: chest pain/ACS  Allergies  Allergen Reactions  . Cefuroxime Itching  . Septra Ds  [Sulfamethoxazole-Trimethoprim]     Other reaction(s): Itching  . Shrimp [Shellfish Allergy] Other (See Comments)    Per mar   . Sulfa Antibiotics Other (See Comments)    Per mar    Patient Measurements: Height: 5\' 11"  (180.3 cm) Weight: 200 lb (90.7 kg) IBW/kg (Calculated) : 75.3 Heparin Dosing Weight: 90.7kg  Vital Signs: Temp: 98.5 F (36.9 C) (02/02 1403) Temp Source: Oral (02/02 1403) BP: 150/64 (02/02 1600) Pulse Rate: 84 (02/02 1600)  Labs: Recent Labs    08/05/17 1409  HGB 11.2*  HCT 34.1*  PLT 219  CREATININE 4.18*  TROPONINI 2.04*    Estimated Creatinine Clearance: 20.3 mL/min (A) (by C-G formula based on SCr of 4.18 mg/dL (H)).   Medical History: Past Medical History:  Diagnosis Date  . Anginal pain ([place])   . Anxiety   . Arthritis   . Asthma   . CHF (congestive heart failure) ([place])   . Coronary artery disease   . Depression   . Diabetes mellitus without complication ([place])   . Dyspnea   . ESRD (end stage renal disease) ([place])    On Tuesday, Thursday and Saturday dialysis  . Heart murmur   . Hyperlipidemia   . Hypertension   . Myocardial infarction ([place])   . Peripheral vascular disease ([place])   . Polio    childhood  . Stroke [facility])     Medications:   (Not in a hospital admission) Scheduled:  . heparin  4,000 Units Intravenous Once   Infusions:  . heparin     PRN:  Anti-infectives (From admission, onward)   None      Assessment: 67 year old with ACS requiring heparin anticoagulation per pharmacy consult.   Goal of Therapy:  Heparin level 0.3-0.7 units/ml Monitor platelets by anticoagulation protocol: Yes   Plan:  Give 4000 units bolus x 1 Start heparin infusion at 1200 units/hr Check anti-Xa level in 6 hours and daily while on heparin Continue to  monitor H&H and platelets  Donna Christen Sundance Moise 08/05/2017,4:22 PM

## 2017-08-05 NOTE — Clinical Social Work Note (Signed)
CSW received consult that patient is from [facility]. CSW will assess once the patient has arrived in his assigned room. CSW is following.  Santiago Bumpers, MSW, Latanya Presser [phone]
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
## 2017-08-05 NOTE — Progress Notes (Signed)
ANTICOAGULATION CONSULT NOTE - Initial Consult  Pharmacy Consult for heparin Indication: chest pain/ACS  Allergies  Allergen Reactions  . Cefuroxime Itching  . Septra Ds  [Sulfamethoxazole-Trimethoprim]     Other reaction(s): Itching  . Shrimp [Shellfish Allergy] Other (See Comments)    Per mar   . Sulfa Antibiotics Other (See Comments)    Per mar    Patient Measurements: Height: 5\' 11"  (180.3 cm) Weight: 200 lb (90.7 kg) IBW/kg (Calculated) : 75.3 Heparin Dosing Weight: 90.7kg  Vital Signs: Temp: 99.1 F (37.3 C) (02/02 2011) Temp Source: Oral (02/02 2011) BP: 114/50 (02/02 2011) Pulse Rate: 63 (02/02 2011)  Labs: Recent Labs    08/05/17 1409 08/05/17 1834 08/05/17 2209  HGB 11.2*  --   --   HCT 34.1*  --   --   PLT 219  --   --   APTT 43*  --   --   LABPROT 15.2  --   --   INR 1.21  --   --   HEPARINUNFRC  --   --  0.27*  CREATININE 4.18*  --   --   TROPONINI 2.04* 1.78*  --     Estimated Creatinine Clearance: 20.3 mL/min (A) (by C-G formula based on SCr of 4.18 mg/dL (H)).   Medical History: Past Medical History:  Diagnosis Date  . Anginal pain ([place])   . Anxiety   . Arthritis   . Asthma   . CHF (congestive heart failure) ([place])   . Coronary artery disease   . Depression   . Diabetes mellitus without complication ([place])   . Dyspnea   . ESRD (end stage renal disease) ([place])    On Tuesday, Thursday and Saturday dialysis  . Heart murmur   . Hyperlipidemia   . Hypertension   . Myocardial infarction ([place])   . Peripheral vascular disease ([place])   . Polio    childhood  . Stroke [facility])     Medications:  Medications Prior to Admission  Medication Sig Dispense Refill Last Dose  . acetaminophen (TYLENOL) 500 MG tablet Take 500 mg by mouth every 4 (four) hours as needed (pain).   08/04/2017 at Unknown time  . aspirin EC 81 MG tablet Take 81 mg by mouth daily at 6 PM.    08/04/2017 at Unknown time  . atorvastatin (LIPITOR) 40 MG tablet Take 40 mg by mouth  daily at 6 PM.    08/04/2017 at Unknown time  . b complex vitamins capsule Take 1 capsule by mouth daily at 6 PM.    08/04/2017 at Unknown time  . bumetanide (BUMEX) 2 MG tablet Take 2 mg by mouth 2 (two) times daily.    08/04/2017 at Unknown time  . clopidogrel (PLAVIX) 75 MG tablet Take 75 mg by mouth daily at 6 PM.    08/04/2017 at Unknown time  . gabapentin (NEURONTIN) 100 MG capsule Take 4 capsules (400 mg total) by mouth at bedtime. (Patient taking differently: Take 100 mg by mouth every evening. ) 15 capsule 0 08/04/2017 at Unknown time  . gabapentin (NEURONTIN) 300 MG capsule Take 300 mg by mouth at bedtime.   08/04/2017 at Unknown time  . Insulin Detemir (LEVEMIR FLEXPEN) 100 UNIT/ML Pen Inject 29 Units into the skin at bedtime.    08/04/2017 at Unknown time  . lactulose (CHRONULAC) 10 GM/15ML solution Take 20 g by mouth at bedtime.    08/04/2017 at Unknown time  . linagliptin (TRADJENTA) 5 MG TABS tablet Take 1 tablet (  5 mg total) by mouth daily at 6 (six) AM. (0630) (Patient taking differently: Take 5 mg by mouth at bedtime. ) 30 tablet 0 08/04/2017 at Unknown time  . lisinopril (PRINIVIL,ZESTRIL) 5 MG tablet Take 5 mg by mouth every evening.    08/04/2017 at Unknown time  . Melatonin 3 MG TABS Take 6 mg by mouth at bedtime.   08/04/2017 at Unknown time  . metoprolol succinate (TOPROL-XL) 100 MG 24 hr tablet Take 100 mg by mouth daily at 6 PM. Take with or immediately following a meal.   08/04/2017 at Unknown time  . oxyCODONE (OXY IR/ROXICODONE) 5 MG immediate release tablet Take 5-10 mg by mouth every 6 (six) hours as needed for severe pain.   08/04/2017 at Unknown time  . polyethylene glycol (MIRALAX / GLYCOLAX) packet Take 17 g by mouth daily at 6 PM.    08/04/2017 at Unknown time  . ranolazine (RANEXA) 500 MG 12 hr tablet Take 500 mg by mouth 2 (two) times daily.    08/04/2017 at Unknown time  . senna (SENOKOT) 8.6 MG TABS tablet Take 8.6 mg by mouth 2 (two) times daily.    08/04/2017 at Unknown time  . sertraline  (ZOLOFT) 100 MG tablet Take 100 mg by mouth at bedtime.    08/04/2017 at Unknown time  . sevelamer carbonate (RENVELA) 800 MG tablet Take 1,600 mg by mouth 3 (three) times daily with meals. Or snacks   08/04/2017 at Unknown time  . traZODone (DESYREL) 50 MG tablet Take 50 mg by mouth at bedtime.   08/04/2017 at Unknown time  . benzocaine (ORAJEL MAXIMUM STRENGTH) 20 % GEL Use as directed 1 application in the mouth or throat 4 (four) times daily as needed (pain).   PRN at PRN  . lidocaine-prilocaine (EMLA) cream Apply 1 application topically Every Tuesday,Thursday,and Saturday with dialysis.    PRN at PRN  . nitroGLYCERIN (NITROSTAT) 0.4 MG SL tablet Place 0.4 mg under the tongue every 5 (five) minutes as needed for chest pain.   PRN at PRN  . Skin Protectants, Misc. (EUCERIN) cream Apply 1 application topically 2 (two) times daily as needed (for pruritus).   PRN at PRN  . traMADol (ULTRAM) 50 MG tablet Take 50 mg by mouth every 6 (six) hours as needed for moderate pain.   PRN at PRN   Scheduled:  . aspirin EC  81 mg Oral q1800  . atorvastatin  40 mg Oral q1800  . bumetanide  2 mg Oral BID  . clopidogrel  75 mg Oral q1800  . ferrous sulfate  325 mg Oral BID  . gabapentin  100 mg Oral QPM  . gabapentin  300 mg Oral QHS  . [START ON 08/06/2017] insulin aspart  0-15 Units Subcutaneous TID WC  . insulin detemir  29 Units Subcutaneous QHS  . lactulose  20 g Oral QHS  . [START ON 08/08/2017] lidocaine-prilocaine  1 application Topical Q T,Th,Sa-HD  . lisinopril  5 mg Oral QPM  . Melatonin  5 mg Oral QHS  . metoprolol succinate  100 mg Oral q1800  . polyethylene glycol  17 g Oral q1800  . ranolazine  500 mg Oral BID  . senna  8.6 mg Oral BID  . sertraline  100 mg Oral QHS  . [START ON 08/06/2017] sevelamer carbonate  1,600 mg Oral TID WC  . sodium chloride flush  3 mL Intravenous Q12H  . traZODone  50 mg Oral QHS  . vitamin B-12  1,000  mcg Oral q1800   Infusions:  . sodium chloride    . heparin 1,200  Units/hr (08/05/17 1832)   PRN:  Anti-infectives (From admission, onward)   None      Assessment: 67 year old with ACS requiring heparin anticoagulation per pharmacy consult.   Goal of Therapy:  Heparin level 0.3-0.7 units/ml Monitor platelets by anticoagulation protocol: Yes   Plan:  Give 4000 units bolus x 1 Start heparin infusion at 1200 units/hr Check anti-Xa level in 6 hours and daily while on heparin Continue to monitor H&H and platelets   0202 @ 2200 HL 0.27 subtherapeutic. Will rebolus w/ heparin 1400 units IV x 1 and will increase rate to 1400 units/hr and will recheck w/ am labs. Will recheck CBC w/ am labs.  Tobie Lords, PharmD, BCPS Clinical Pharmacist 08/05/2017

## 2017-08-05 NOTE — Progress Notes (Signed)
Family Meeting Note  Advance Directive:yes  Today a meeting took place with the Patient. And daughter who is power of attorney    The following clinical team members were present during this meeting:MD  The following were discussed:Patient's diagnosis: Elevated troponin possibly concerning for non-ST elevation MI Abdominal distention End-stage renal disease on hemodialysis Chronic diastolic heart failure COPD CAD Diabetes Patient's progosis: Unable to determine and Goals for treatment: Full Code  Additional follow-up to be provided: Family to bring in advanced directives  Time spent during discussion: 16 minutes  Nevia Henkin, MD

## 2017-08-05 NOTE — Plan of Care (Signed)
Patient admitted to unit. Oriented to room, call bell, and staff. Bed in lowest position. Fall safety plan reviewed. Full assessment to Epic. Skin assessment verified with Ginger Carne, RN. Telemetry box verification with tele clerk- Box#: [id number]. Will continue to monitor.  Progressing Education: Knowledge of General Education information will improve 08/05/2017 1943 - Progressing by Rolley Sims, RN Health Behavior/Discharge Planning: Ability to manage health-related needs will improve 08/05/2017 1943 - Progressing by Rolley Sims, RN Clinical Measurements: Will remain free from infection 08/05/2017 1943 - Progressing by Rolley Sims, RN Respiratory complications will improve 08/05/2017 1943 - Progressing by Rolley Sims, RN

## 2017-08-05 NOTE — ED Notes (Signed)
Date and time results received: 08/05/17 1520 (use smartphrase ".now" to insert current time)  Test: troponin Critical Value: 2.04  Name of Provider Notified: MD Corky Downs  Orders Received? Or Actions Taken?: Orders Received - See Orders for details

## 2017-08-05 NOTE — ED Notes (Signed)
Lab called and will add on blood test to blood in lab

## 2017-08-06 DIAGNOSIS — R001 Bradycardia, unspecified: Secondary | ICD-10-CM

## 2017-08-06 DIAGNOSIS — I959 Hypotension, unspecified: Secondary | ICD-10-CM | POA: Diagnosis not present

## 2017-08-06 DIAGNOSIS — R748 Abnormal levels of other serum enzymes: Secondary | ICD-10-CM

## 2017-08-06 LAB — GLUCOSE, CAPILLARY
Glucose-Capillary: 49 mg/dL — ABNORMAL LOW (ref 65–99)
Glucose-Capillary: 146 mg/dL — ABNORMAL HIGH (ref 65–99)
Glucose-Capillary: 78 mg/dL (ref 65–99)

## 2017-08-06 LAB — TROPONIN I
TROPONIN I: 1.74 ng/mL — AB (ref <0.03)
Troponin I: 1.69 ng/mL (ref <0.03)

## 2017-08-06 LAB — CBC
HEMATOCRIT: 31.5 % — AB (ref 40.0–52.0)
HEMOGLOBIN: 10.3 g/dL — AB (ref 13.0–18.0)
MCH: 31.8 pg (ref 26.0–34.0)
MCHC: 32.7 g/dL (ref 32.0–36.0)
MCV: 97.4 fL (ref 80.0–100.0)
Platelets: 195 10*3/uL (ref 150–440)
RBC: 3.24 MIL/uL — AB (ref 4.40–5.90)
RDW: 17.7 % — ABNORMAL HIGH (ref 11.5–14.5)
WBC: 5.7 10*3/uL (ref 3.8–10.6)

## 2017-08-06 LAB — BASIC METABOLIC PANEL
ANION GAP: 12 (ref 5–15)
BUN: 22 mg/dL — ABNORMAL HIGH (ref 6–20)
CO2: 27 mmol/L (ref 22–32)
Calcium: 8 mg/dL — ABNORMAL LOW (ref 8.9–10.3)
Chloride: 95 mmol/L — ABNORMAL LOW (ref 101–111)
Creatinine, Ser: 6.07 mg/dL — ABNORMAL HIGH (ref 0.61–1.24)
GFR, EST AFRICAN AMERICAN: 10 mL/min — AB (ref >60)
GFR, EST NON AFRICAN AMERICAN: 9 mL/min — AB (ref >60)
Glucose, Bld: 73 mg/dL (ref 65–99)
POTASSIUM: 3.5 mmol/L (ref 3.5–5.1)
Sodium: 134 mmol/L — ABNORMAL LOW (ref 135–145)

## 2017-08-06 LAB — HEMOGLOBIN A1C
Hgb A1c MFr Bld: 6.4 % — ABNORMAL HIGH (ref 4.8–5.6)
MEAN PLASMA GLUCOSE: 136.98 mg/dL

## 2017-08-06 LAB — HEPARIN LEVEL (UNFRACTIONATED): Heparin Unfractionated: 0.29 IU/mL — ABNORMAL LOW (ref 0.30–0.70)

## 2017-08-06 MED ORDER — INSULIN DETEMIR 100 UNIT/ML FLEXPEN: 15.0000 [IU] | PEN_INJECTOR | Freq: Every day | SUBCUTANEOUS | 11 refills | Status: DC

## 2017-08-06 MED ORDER — METOPROLOL SUCCINATE ER 25 MG PO TB24: 100.0000 mg | ORAL_TABLET | Freq: Every day | ORAL | 0 refills | Status: DC

## 2017-08-06 MED ORDER — OXYCODONE HCL 5 MG PO TABS: 5.0000 mg | ORAL_TABLET | Freq: Four times a day (QID) | ORAL | 0 refills | Status: DC | PRN

## 2017-08-06 MED ORDER — METOPROLOL SUCCINATE ER 25 MG PO TB24: 25.0000 mg | ORAL_TABLET | Freq: Every day | ORAL | 0 refills | Status: DC

## 2017-08-06 MED ORDER — HEPARIN BOLUS VIA INFUSION
2500.0000 [IU] | Freq: Once | INTRAVENOUS | Status: AC
  Administered 2017-08-06: 2500 [IU] via INTRAVENOUS
  Filled 2017-08-06: qty 2500

## 2017-08-06 NOTE — Progress Notes (Signed)
ANTICOAGULATION CONSULT NOTE - Initial Consult  Pharmacy Consult for heparin Indication: chest pain/ACS  Allergies  Allergen Reactions  . Cefuroxime Itching  . Septra Ds  [Sulfamethoxazole-Trimethoprim]     Other reaction(s): Itching  . Shrimp [Shellfish Allergy] Other (See Comments)    Per mar   . Sulfa Antibiotics Other (See Comments)    Per mar    Patient Measurements: Height: 5\' 11"  (180.3 cm) Weight: 204 lb 4.8 oz (92.7 kg) IBW/kg (Calculated) : 75.3 Heparin Dosing Weight: 90.7kg  Vital Signs: Temp: 98.3 F (36.8 C) (02/03 0431) Temp Source: Oral (02/02 2011) BP: 105/49 (02/03 0431) Pulse Rate: 67 (02/03 0431)  Labs: Recent Labs    08/05/17 1409 08/05/17 1834 08/05/17 2209 08/05/17 2346 08/06/17 0601  HGB 11.2*  --   --   --  10.3*  HCT 34.1*  --   --   --  31.5*  PLT 219  --   --   --  195  APTT 43*  --   --   --   --   LABPROT 15.2  --   --   --   --   INR 1.21  --   --   --   --   HEPARINUNFRC  --   --  0.27*  --  0.29*  CREATININE 4.18*  --   --   --   --   TROPONINI 2.04* 1.78*  --  1.69*  --     Estimated Creatinine Clearance: 20.5 mL/min (A) (by C-G formula based on SCr of 4.18 mg/dL (H)).   Medical History: Past Medical History:  Diagnosis Date  . Anginal pain ([place])   . Anxiety   . Arthritis   . Asthma   . CHF (congestive heart failure) ([place])   . Coronary artery disease   . Depression   . Diabetes mellitus without complication ([place])   . Dyspnea   . ESRD (end stage renal disease) ([place])    On Tuesday, Thursday and Saturday dialysis  . Heart murmur   . Hyperlipidemia   . Hypertension   . Myocardial infarction ([place])   . Peripheral vascular disease ([place])   . Polio    childhood  . Stroke [facility])     Medications:  Medications Prior to Admission  Medication Sig Dispense Refill Last Dose  . acetaminophen (TYLENOL) 500 MG tablet Take 500 mg by mouth every 4 (four) hours as needed (pain).   08/04/2017 at Unknown time  . aspirin EC 81 MG  tablet Take 81 mg by mouth daily at 6 PM.    08/04/2017 at Unknown time  . atorvastatin (LIPITOR) 40 MG tablet Take 40 mg by mouth daily at 6 PM.    08/04/2017 at Unknown time  . b complex vitamins capsule Take 1 capsule by mouth daily at 6 PM.    08/04/2017 at Unknown time  . bumetanide (BUMEX) 2 MG tablet Take 2 mg by mouth 2 (two) times daily.    08/04/2017 at Unknown time  . clopidogrel (PLAVIX) 75 MG tablet Take 75 mg by mouth daily at 6 PM.    08/04/2017 at Unknown time  . gabapentin (NEURONTIN) 100 MG capsule Take 4 capsules (400 mg total) by mouth at bedtime. (Patient taking differently: Take 100 mg by mouth every evening. ) 15 capsule 0 08/04/2017 at Unknown time  . gabapentin (NEURONTIN) 300 MG capsule Take 300 mg by mouth at bedtime.   08/04/2017 at Unknown time  . Insulin Detemir (LEVEMIR FLEXPEN)  100 UNIT/ML Pen Inject 29 Units into the skin at bedtime.    08/04/2017 at Unknown time  . lactulose (CHRONULAC) 10 GM/15ML solution Take 20 g by mouth at bedtime.    08/04/2017 at Unknown time  . linagliptin (TRADJENTA) 5 MG TABS tablet Take 1 tablet (5 mg total) by mouth daily at 6 (six) AM. (0630) (Patient taking differently: Take 5 mg by mouth at bedtime. ) 30 tablet 0 08/04/2017 at Unknown time  . lisinopril (PRINIVIL,ZESTRIL) 5 MG tablet Take 5 mg by mouth every evening.    08/04/2017 at Unknown time  . Melatonin 3 MG TABS Take 6 mg by mouth at bedtime.   08/04/2017 at Unknown time  . metoprolol succinate (TOPROL-XL) 100 MG 24 hr tablet Take 100 mg by mouth daily at 6 PM. Take with or immediately following a meal.   08/04/2017 at Unknown time  . oxyCODONE (OXY IR/ROXICODONE) 5 MG immediate release tablet Take 5-10 mg by mouth every 6 (six) hours as needed for severe pain.   08/04/2017 at Unknown time  . polyethylene glycol (MIRALAX / GLYCOLAX) packet Take 17 g by mouth daily at 6 PM.    08/04/2017 at Unknown time  . ranolazine (RANEXA) 500 MG 12 hr tablet Take 500 mg by mouth 2 (two) times daily.    08/04/2017 at Unknown  time  . senna (SENOKOT) 8.6 MG TABS tablet Take 8.6 mg by mouth 2 (two) times daily.    08/04/2017 at Unknown time  . sertraline (ZOLOFT) 100 MG tablet Take 100 mg by mouth at bedtime.    08/04/2017 at Unknown time  . sevelamer carbonate (RENVELA) 800 MG tablet Take 1,600 mg by mouth 3 (three) times daily with meals. Or snacks   08/04/2017 at Unknown time  . traZODone (DESYREL) 50 MG tablet Take 50 mg by mouth at bedtime.   08/04/2017 at Unknown time  . benzocaine (ORAJEL MAXIMUM STRENGTH) 20 % GEL Use as directed 1 application in the mouth or throat 4 (four) times daily as needed (pain).   PRN at PRN  . lidocaine-prilocaine (EMLA) cream Apply 1 application topically Every Tuesday,Thursday,and Saturday with dialysis.    PRN at PRN  . nitroGLYCERIN (NITROSTAT) 0.4 MG SL tablet Place 0.4 mg under the tongue every 5 (five) minutes as needed for chest pain.   PRN at PRN  . Skin Protectants, Misc. (EUCERIN) cream Apply 1 application topically 2 (two) times daily as needed (for pruritus).   PRN at PRN  . traMADol (ULTRAM) 50 MG tablet Take 50 mg by mouth every 6 (six) hours as needed for moderate pain.   PRN at PRN   Scheduled:  . aspirin EC  81 mg Oral q1800  . atorvastatin  40 mg Oral q1800  . bumetanide  2 mg Oral BID  . clopidogrel  75 mg Oral q1800  . ferrous sulfate  325 mg Oral BID  . gabapentin  100 mg Oral QPM  . gabapentin  300 mg Oral QHS  . heparin  2,500 Units Intravenous Once  . insulin aspart  0-15 Units Subcutaneous TID WC  . insulin detemir  29 Units Subcutaneous QHS  . lactulose  20 g Oral QHS  . [START ON 08/08/2017] lidocaine-prilocaine  1 application Topical Q T,Th,Sa-HD  . lisinopril  5 mg Oral QPM  . Melatonin  5 mg Oral QHS  . metoprolol succinate  100 mg Oral q1800  . polyethylene glycol  17 g Oral q1800  . ranolazine  500 mg  Oral BID  . senna  8.6 mg Oral BID  . sertraline  100 mg Oral QHS  . sevelamer carbonate  1,600 mg Oral TID WC  . sodium chloride flush  3 mL  Intravenous Q12H  . traZODone  50 mg Oral QHS  . vitamin B-12  1,000 mcg Oral q1800   Infusions:  . sodium chloride    . heparin 1,400 Units/hr (08/05/17 2309)   PRN:  Anti-infectives (From admission, onward)   None      Assessment: 67 year old with ACS requiring heparin anticoagulation per pharmacy consult.   Goal of Therapy:  Heparin level 0.3-0.7 units/ml Monitor platelets by anticoagulation protocol: Yes   Plan:  Give 4000 units bolus x 1 Start heparin infusion at 1200 units/hr Check anti-Xa level in 6 hours and daily while on heparin Continue to monitor H&H and platelets   0202 @ 2200 HL 0.27 subtherapeutic. Will rebolus w/ heparin 1400 units IV x 1 and will increase rate to 1400 units/hr and will recheck w/ am labs. Will recheck CBC w/ am labs.  0203 @ 0600 HL 0.29 subtherapeutic. Will rebolus w/ heparin 2500 units IV x 1 and increase rate to 1500 units/hr and will recheck HL @ 1200.  Tobie Lords, PharmD, BCPS Clinical Pharmacist 08/06/2017

## 2017-08-06 NOTE — NC FL2 (Signed)
[place] LEVEL OF CARE SCREENING TOOL     IDENTIFICATION  Patient Name: Dennis Walker Birthdate: 1950-11-22 Sex: male Admission Date (Current Location): 08/05/2017  [place] and [place] Number:  Dennis Walker [id number] [place] and Address:  [facility], [address] [place], [place] [address]      Provider Number: [id number]  Attending Physician Name and Address:  Fritzi Mandes, MD  Relative Name and Phone Number:  Theodoro Grist [phone]    Current Level of Care: Hospital Recommended Level of Care: [place] Prior Approval Number:    Date Approved/Denied:   PASRR Number: [id number]  Discharge Plan: SNF    Current Diagnoses: Patient Active Problem List   Diagnosis Date Noted  . Elevated troponin 08/05/2017  . 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 Complication from renal dialysis device 02/20/2017  . End stage renal disease ([place]) 02/12/2017  . Altered mental status 02/02/2017  . Atherosclerosis of native arteries of extremity with intermittent claudication ([place]) 12/20/2016  . 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 Diabetes ([place]) 12/20/2016  . Essential hypertension 12/20/2016  . Pure hypercholesterolemia 12/20/2016    Orientation RESPIRATION BLADDER Height & Weight     Self, Place  Normal Continent Weight: 204 lb 4.8 oz (92.7 kg) Height:  5\' 11"  (180.3 cm)  BEHAVIORAL SYMPTOMS/MOOD NEUROLOGICAL BOWEL NUTRITION STATUS      Continent Diet(Renal/Carb modified Fluid restriction 1200 mL)  AMBULATORY STATUS COMMUNICATION OF NEEDS Skin   Limited Assist Verbally Normal                       Personal Care Assistance Level of Assistance  Bathing, Feeding, Dressing Bathing Assistance: Limited assistance Feeding assistance: Independent Dressing Assistance: Limited assistance     Functional Limitations Info             SPECIAL CARE FACTORS FREQUENCY                      Contractures Contractures Info: Not present    Additional Factors Info  Code Status, Allergies, Psychotropic Code Status Info: Full Allergies Info: Cefuroxime, Septra Ds  Sulfamethoxazole-trimethoprim, Shrimp Shellfish Allergy, Sulfa Antibiotics Psychotropic Info: Zoloft         Current Medications (08/06/2017):  This is the current hospital active medication list Current Facility-Administered Medications  Medication Dose Route Frequency Provider Last Rate Last Dose  . 0.9 %  sodium chloride infusion  250 mL Intravenous PRN Bettey Costa, MD      . acetaminophen (TYLENOL) tablet 650 mg  650 mg Oral Q6H PRN Bettey Costa, MD       Or  . acetaminophen (TYLENOL) suppository 650 mg  650 mg Rectal Q6H PRN Mody, Sital, MD      . acetaminophen (TYLENOL) tablet 500 mg  500 mg Oral Q4H PRN Mody, Sital, MD      . aspirin EC tablet 81 mg  81 mg Oral q1800 Mody, Sital, MD   81 mg at 08/05/17 1830  . atorvastatin (LIPITOR) tablet 40 mg  40 mg Oral q1800 Bettey Costa, MD   40 mg at 08/05/17 1830  . bisacodyl (DULCOLAX) suppository 10 mg  10 mg Rectal Daily PRN Bettey Costa, MD      . bumetanide (BUMEX) tablet 2 mg  2 mg Oral BID Bettey Costa, MD   2 mg at 08/06/17 0928  . clopidogrel (PLAVIX) tablet 75 mg  75 mg Oral q1800 Mody, Sital,  MD   75 mg at 08/05/17 1829  . ferrous sulfate tablet 325 mg  325 mg Oral BID Bettey Costa, MD   325 mg at 08/06/17 0928  . gabapentin (NEURONTIN) capsule 100 mg  100 mg Oral QPM Bettey Costa, MD   100 mg at 08/05/17 1830  . gabapentin (NEURONTIN) capsule 300 mg  300 mg Oral QHS Mody, Sital, MD   300 mg at 08/05/17 2107  . hydrocerin (EUCERIN) cream 1 application  1 application Topical BID PRN Mody, Sital, MD      . insulin aspart (novoLOG) injection 0-15 Units  0-15 Units Subcutaneous TID WC Mody, Sital, MD      . insulin detemir (LEVEMIR) injection 29 Units  29 Units Subcutaneous QHS Bettey Costa, MD   29 Units at 08/05/17 2101  . lactulose (CHRONULAC) 10 GM/15ML solution  20 g  20 g Oral QHS Bettey Costa, MD      . Derrill Memo ON 08/08/2017] lidocaine-prilocaine (EMLA) cream 1 application  1 application Topical Q T,Th,Sa-HD Mody, Sital, MD      . lisinopril (PRINIVIL,ZESTRIL) tablet 5 mg  5 mg Oral QPM Mody, Sital, MD      . Melatonin TABS 5 mg  5 mg Oral QHS Bettey Costa, MD   5 mg at 08/05/17 2107  . nitroGLYCERIN (NITROSTAT) SL tablet 0.4 mg  0.4 mg Sublingual Q5 min PRN Benjie Karvonen, Sital, MD      . ondansetron (ZOFRAN) tablet 4 mg  4 mg Oral Q6H PRN Mody, Sital, MD       Or  . ondansetron (ZOFRAN) injection 4 mg  4 mg Intravenous Q6H PRN Mody, Sital, MD      . oxyCODONE (Oxy IR/ROXICODONE) immediate release tablet 5-10 mg  5-10 mg Oral Q6H PRN Bettey Costa, MD   10 mg at 08/05/17 1859  . polyethylene glycol (MIRALAX / GLYCOLAX) packet 17 g  17 g Oral q1800 Bettey Costa, MD   17 g at 08/05/17 1834  . polyethylene glycol (MIRALAX / GLYCOLAX) packet 17 g  17 g Oral Daily PRN Bettey Costa, MD      . ranolazine (RANEXA) 12 hr tablet 500 mg  500 mg Oral BID Bettey Costa, MD   500 mg at 08/06/17 0928  . senna (SENOKOT) tablet 8.6 mg  8.6 mg Oral BID Bettey Costa, MD   8.6 mg at 08/06/17 0928  . sertraline (ZOLOFT) tablet 100 mg  100 mg Oral QHS Bettey Costa, MD   100 mg at 08/05/17 2102  . sevelamer carbonate (RENVELA) tablet 1,600 mg  1,600 mg Oral TID WC Mody, Sital, MD   1,600 mg at 08/06/17 1103  . sodium chloride flush (NS) 0.9 % injection 3 mL  3 mL Intravenous Q12H Bettey Costa, MD   3 mL at 08/06/17 0928  . sodium chloride flush (NS) 0.9 % injection 3 mL  3 mL Intravenous PRN Mody, Sital, MD      . traMADol (ULTRAM) tablet 50 mg  50 mg Oral Q6H PRN Mody, Sital, MD      . traZODone (DESYREL) tablet 50 mg  50 mg Oral QHS Bettey Costa, MD   50 mg at 08/05/17 2102  . vitamin B-12 (CYANOCOBALAMIN) tablet 1,000 mcg  1,000 mcg Oral [id number] Bettey Costa, MD   1,000 mcg at 08/05/17 1831     Discharge Medications: Please see discharge summary for a list of discharge medications.  Relevant  Imaging Results:  Relevant Lab Results:   Additional Information SS# [id number]  Zettie Pho, LCSW

## 2017-08-06 NOTE — Care Management CC44 (Signed)
Condition Code 44 Documentation Completed  Patient Details  Name: Dennis Walker MRN: [id number] Date of Birth: 04/01/1951   Condition Code 44 given:  Yes Patient signature on Condition Code 44 notice:  Yes Documentation of 2 MD's agreement:  Yes Code 44 added to claim:  Yes    July Nickson A, RN 08/06/2017, 1:15 PM

## 2017-08-06 NOTE — Care Management Obs Status (Signed)
[place] NOTIFICATION   Patient Details  Name: Dennis Walker MRN: [id number] Date of Birth: June 22, 1951   Medicare Observation Status Notification Given:  Yes    Deniah Saia A, RN 08/06/2017, 1:14 PM

## 2017-08-06 NOTE — Clinical Social Work Note (Signed)
Clinical Social Work Assessment  Patient Details  Name: Dennis Walker MRN: [id number] Date of Birth: 07/17/1950  Date of referral:  08/06/17               Reason for consult:  Facility Placement                Permission sought to share information with:  Facility Art therapist granted to share information::  Yes, Verbal Permission Granted  Name::        Agency::  [facility]  Relationship::     Contact Information:     Housing/Transportation Living arrangements for the past 2 months:  [place] of Information:  Patient, Medical Team, Adult Children, Facility Patient Interpreter Needed:  None Criminal Activity/Legal Involvement Pertinent to Current Situation/Hospitalization:  No - Comment as needed Significant Relationships:  Adult Children, Warehouse manager, [employer] Lives with:  Facility Resident Do you feel safe going back to the place where you live?  Yes Need for family participation in patient care:  No (Coment)  Care giving concerns:  Patient admitted from [facility]/LTC   Social Worker assessment / plan:  CSW spoke with the patient to discuss discharge planning. The patient gave verbal permission to discuss discharge with his daughter, and he stated that he plans to return to [facility] when stable. The patient's daughter Dennis Walker also agreed with discharge today to [facility], and she agreed with non-emergent EMS for transport. CSW spoke with Broadus John at [facility] who confirmed that the patient could return to room 401A. The facility has received all paperwork, and the discharge packet has been delivered to the chart. The CSW is signing off. Please consult should additional needs arise.  Employment status:  Retired Forensic scientist:  Medicare PT Recommendations:  Not assessed at this time Information / Referral to community resources:     Patient/Family's Response to care:  The patient and his daughter thanked the  CSW.  Patient/Family's Understanding of and Emotional Response to Diagnosis, Current Treatment, and Prognosis:  The patient understands and agrees with the discharge plan, as does his family and facility.  Emotional Assessment Appearance:  Appears stated age Attitude/Demeanor/Rapport:  Engaged Affect (typically observed):  Appropriate, Pleasant Orientation:  Oriented to Self, Oriented to Place, Oriented to  Time, Oriented to Situation Alcohol / Substance use:  Never Used Psych involvement (Current and /or in the community):  No (Comment)  Discharge Needs  Concerns to be addressed:  Care Coordination, Discharge Planning Concerns Readmission within the last 30 days:  Yes Current discharge risk:  Chronically ill Barriers to Discharge:  No Barriers Identified   Zettie Pho, LCSW 08/06/2017, 1:45 PM
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
## 2017-08-06 NOTE — Discharge Summary (Addendum)
[facility] [place] NAME: Dennis Walker    MR#:  [id number]  DATE OF BIRTH:  [birth year]  DATE OF ADMISSION:  08/05/2017 ADMITTING PHYSICIAN: Bettey Costa, MD  DATE OF DISCHARGE: 08/06/2017  PRIMARY CARE PHYSICIAN: Juluis Pitch, MD    ADMISSION DIAGNOSIS:  Weakness [R53.1] Elevated troponin [R74.8]  DISCHARGE DIAGNOSIS:  Generalized weakness with hypotension and bradycardia improved Elevated troponin appears demand ischemia End-stage renal disease on hemodialysis Transient hypoglycemia resolved  SECONDARY DIAGNOSIS:   Past Medical History:  Diagnosis Date  . Anginal pain ([place])   . Anxiety   . Arthritis   . Asthma   . CHF (congestive heart failure) ([place])   . Coronary artery disease   . Depression   . Diabetes mellitus without complication ([place])   . Dyspnea   . ESRD (end stage renal disease) ([place])    On Tuesday, Thursday and Saturday dialysis  . Heart murmur   . Hyperlipidemia   . Hypertension   . Myocardial infarction ([place])   . Peripheral vascular disease ([place])   . Polio    childhood  . Stroke [facility])     HOSPITAL COURSE:  67 year old male with end-stage renal disease on hemodialysis, CAD, chronic diastolic heart failure with preserved ejection fraction and history of CVA with residual left sided deficits who presents from dialysis with bradycardia and hypotension.  1. Elevated troponin in the setting of hypotension and bradycardia suspected demand ischemia than true [facility] cardiology consulted--spoke with Dr. Lovena Le -Is chest pain-free vitals are stable.  Discontinue heparin drip. -Decreased his metoprolol to 25 mg daily -Continue aspirin, statin, Plavix, lisinopril and metoprolol   2. Bradycardia and hypotension: This seems to have resolved. -Meds adjusted  -Pressure improved since admission.  She is eating okay  3. End-stage renal disease on hemodialysis with abdominal distention: Nephrology consultation  for dialysis--by Dr. Juleen China.  No urgent indication for dialysis  4. Chronic diastolic heart failure w-appears at baseline   5. Essential hypertension: Continue lisinopril and metoprolol (decreased dose to 25 mg from 100 mg)  6. Diabetes: Continue insulin with sliding scale and ADA diet -decreased lantus to 15 units to avoid hypoglycemia   7. Hyperlipidemia: Continue statin  Spoke with daughter [name].  Daughter is worried about losing bed at [facility].  Since patient is overall improved since yesterday and no further cardiology workup planned patient will go back to [facility] today.  Daughter is very agreeable with the plan.  She is a full code according to daughter  CONSULTS OBTAINED:  Treatment Team:  Lavonia Dana, MD Evans Lance, MD  DRUG ALLERGIES:   Allergies  Allergen Reactions  . Cefuroxime Itching  . Septra Ds  [Sulfamethoxazole-Trimethoprim]     Other reaction(s): Itching  . Shrimp [Shellfish Allergy] Other (See Comments)    Per mar   . Sulfa Antibiotics Other (See Comments)    Per mar    DISCHARGE MEDICATIONS:   Allergies as of 08/06/2017      Reactions   Cefuroxime Itching   Septra Ds  [sulfamethoxazole-trimethoprim]    Other reaction(s): Itching   Shrimp [shellfish Allergy] Other (See Comments)   Per mar   Sulfa Antibiotics Other (See Comments)   Per mar      Medication List    TAKE these medications   acetaminophen 500 MG tablet Commonly known as:  TYLENOL Take 500 mg by mouth every 4 (four) hours as needed (pain).   aspirin EC 81  MG tablet Take 81 mg by mouth daily at 6 PM.   atorvastatin 40 MG tablet Commonly known as:  LIPITOR Take 40 mg by mouth daily at 6 PM.   b complex vitamins capsule Take 1 capsule by mouth daily at 6 PM.   bumetanide 2 MG tablet Commonly known as:  BUMEX Take 2 mg by mouth 2 (two) times daily.   clopidogrel 75 MG tablet Commonly known as:  PLAVIX Take 75 mg by mouth daily at 6 PM.    eucerin cream Apply 1 application topically 2 (two) times daily as needed (for pruritus).   gabapentin 300 MG capsule Commonly known as:  NEURONTIN Take 300 mg by mouth at bedtime. What changed:  Another medication with the same name was changed. Make sure you understand how and when to take each.   gabapentin 100 MG capsule Commonly known as:  NEURONTIN Take 4 capsules (400 mg total) by mouth at bedtime. What changed:    how much to take  when to take this   Insulin Detemir 100 UNIT/ML Pen Commonly known as:  LEVEMIR FLEXPEN Inject 15 Units into the skin at bedtime. What changed:  how much to take   lactulose 10 GM/15ML solution Commonly known as:  CHRONULAC Take 20 g by mouth at bedtime.   lidocaine-prilocaine cream Commonly known as:  EMLA Apply 1 application topically Every Tuesday,Thursday,and Saturday with dialysis.   linagliptin 5 MG Tabs tablet Commonly known as:  TRADJENTA Take 1 tablet (5 mg total) by mouth daily at 6 (six) AM. (0630) What changed:    when to take this  additional instructions   lisinopril 5 MG tablet Commonly known as:  PRINIVIL,ZESTRIL Take 5 mg by mouth every evening.   Melatonin 3 MG Tabs Take 6 mg by mouth at bedtime.   metoprolol succinate 25 MG 24 hr tablet Commonly known as:  TOPROL-XL Take 1 tablet (25 mg total) by mouth daily. Take with or immediately following a meal. What changed:    medication strength  how much to take  when to take this   nitroGLYCERIN 0.4 MG SL tablet Commonly known as:  NITROSTAT Place 0.4 mg under the tongue every 5 (five) minutes as needed for chest pain.   ORAJEL MAXIMUM STRENGTH 20 % Gel Generic drug:  benzocaine Use as directed 1 application in the mouth or throat 4 (four) times daily as needed (pain).   oxyCODONE 5 MG immediate release tablet Commonly known as:  Oxy IR/ROXICODONE Take 1 tablet (5 mg total) by mouth every 6 (six) hours as needed for severe pain. What changed:  how  much to take   polyethylene glycol packet Commonly known as:  MIRALAX / GLYCOLAX Take 17 g by mouth daily at 6 PM.   ranolazine 500 MG 12 hr tablet Commonly known as:  RANEXA Take 500 mg by mouth 2 (two) times daily.   RENVELA 800 MG tablet Generic drug:  sevelamer carbonate Take 1,600 mg by mouth 3 (three) times daily with meals. Or snacks   senna 8.6 MG Tabs tablet Commonly known as:  SENOKOT Take 8.6 mg by mouth 2 (two) times daily.   sertraline 100 MG tablet Commonly known as:  ZOLOFT Take 100 mg by mouth at bedtime.   traMADol 50 MG tablet Commonly known as:  ULTRAM Take 50 mg by mouth every 6 (six) hours as needed for moderate pain.   traZODone 50 MG tablet Commonly known as:  DESYREL Take 50 mg by mouth at  bedtime.       If you experience worsening of your admission symptoms, develop shortness of breath, life threatening emergency, suicidal or homicidal thoughts you must seek medical attention immediately by calling 911 or calling your MD immediately  if symptoms less severe.  You Must read complete instructions/literature along with all the possible adverse reactions/side effects for all the Medicines you take and that have been prescribed to you. Take any new Medicines after you have completely understood and accept all the possible adverse reactions/side effects.   Please note  You were cared for by a hospitalist during your hospital stay. If you have any questions about your discharge medications or the care you received while you were in the hospital after you are discharged, you can call the unit and asked to speak with the hospitalist on call if the hospitalist that took care of you is not available. Once you are discharged, your primary care physician will handle any further medical issues. Please note that NO REFILLS for any discharge medications will be authorized once you are discharged, as it is imperative that you return to your primary care physician (or  establish a relationship with a primary care physician if you do not have one) for your aftercare needs so that they can reassess your need for medications and monitor your lab values. Today   SUBJECTIVE   No new complaints denies any chest pain  VITAL SIGNS:  Blood pressure (!) 110/53, pulse (!) 59, temperature 98.3 F (36.8 C), resp. rate 18, height 5\' 11"  (1.803 m), weight 92.7 kg (204 lb 4.8 oz), SpO2 97 %.  I/O:    Intake/Output Summary (Last 24 hours) at 08/06/2017 1453 Last data filed at 08/06/2017 1406 Gross per 24 hour  Intake 369.83 ml  Output 0 ml  Net 369.83 ml    PHYSICAL EXAMINATION:  GENERAL:  67 y.o.-year-old patient lying in the bed with no acute distress.chronically ill  EYES: Pupils equal, round, reactive to light and accommodation. No scleral icterus. Extraocular muscles intact.  HEENT: Head atraumatic, normocephalic. Oropharynx and nasopharynx clear.  NECK:  Supple, no jugular venous distention. No thyroid enlargement, no tenderness.  LUNGS: Normal breath sounds bilaterally, no wheezing, rales,rhonchi or crepitation. No use of accessory muscles of respiration.  CARDIOVASCULAR: S1, S2 normal. No murmurs, rubs, or gallops.  ABDOMEN: Soft, non-tender, non-distended. Bowel sounds present. No organomegaly or mass.  EXTREMITIES: No pedal edema, cyanosis, or clubbing. Right bkA stump NEUROLOGIC: Cranial nerves II through XII are intact.  No Focal deficit sensation intact. Gait not checked.  PSYCHIATRIC: The patient is alert and oriented x 3.  SKIN: No obvious rash, lesion, or ulcer.   DATA REVIEW:   CBC  Recent Labs  Lab 08/06/17 0601  WBC 5.7  HGB 10.3*  HCT 31.5*  PLT 195    Chemistries  Recent Labs  Lab 08/05/17 1409 08/06/17 0601  NA 135 134*  K 3.7 3.5  CL 96* 95*  CO2 29 27  GLUCOSE 152* 73  BUN 14 22*  CREATININE 4.18* 6.07*  CALCIUM 8.1* 8.0*  AST 27  --   ALT 21  --   ALKPHOS 79  --   BILITOT 0.5  --     Microbiology Results    Recent Results (from the past 240 hour(s))  MRSA PCR Screening     Status: None   Collection Time: 08/05/17  6:10 PM  Result Value Ref Range Status   MRSA by PCR NEGATIVE NEGATIVE Final  Comment:        The GeneXpert MRSA Assay (FDA approved for NASAL specimens only), is one component of a comprehensive MRSA colonization surveillance program. It is not intended to diagnose MRSA infection nor to guide or monitor treatment for MRSA infections. Performed at [facility], [address] [place], [place] [address]     RADIOLOGY:  Dg Abdomen 1 View  Result Date: 08/05/2017 CLINICAL DATA:  Patient reports abdominal distention and onset of fatigue over the past 3 days. Patient denies abdominal pain and reports normal BM's. Patient reports last dialysis treatment was yesterday. Hx DM, ESRD. EXAM: ABDOMEN - 1 VIEW COMPARISON:  None. FINDINGS: Bowel gas pattern is nonobstructive. There is significant stool throughout mildly dilated loops of colon. There is significant calcification of the vas deferens and small arteries. No evidence for organomegaly. No abnormal calcifications. IMPRESSION: 1.  No evidence for acute  abnormality. 2. Significant stool burden. Electronically Signed   By: Nolon Nations M.D.   On: 08/05/2017 16:56     Management plans discussed with the patient, family and they are in agreement.  CODE STATUS:     Code Status Orders  (From admission, onward)        Start     Ordered   08/05/17 1756  Full code  Continuous     08/05/17 1756    Code Status History    Date Active Date Inactive Code Status Order ID Comments User Context   02/02/2017 18:30 02/05/2017 18:25 Full Code [id number]  Henreitta Leber, MD Inpatient    Advance Directive Documentation     Most Recent Value  Type of Advance Directive  Healthcare Power of Attorney  Pre-existing out of facility DNR order (yellow form or pink MOST form)  No data  "MOST" Form in Place?  No data       TOTAL TIME TAKING CARE OF THIS PATIENT: 40 minutes.    Fritzi Mandes M.D on 08/06/2017 at 2:53 PM  Between 7am to 6pm - Pager - [phone] After 6pm go to [email or web address] - password [email or web address] [facility]  Office  [phone]  CC: Primary care physician; Juluis Pitch, MD

## 2017-08-06 NOTE — Progress Notes (Addendum)
Called report to the facility at this time. All questions answered. [facility] for non-emergent patient transport to the facility at this time. Wenda Low [facility]

## 2017-08-06 NOTE — Progress Notes (Signed)
[facility]  ROUNDING NOTE   Subjective:   [name] admitted to [facility] on 08/05/2017 for Weakness [R53.1] Elevated troponin [R74.8]  Patient had chest pain on admission.  Last hemodialysis treatment was yesterday.   Objective:  Vital signs in last 24 hours:  Temp:  [98.3 F (36.8 C)-99.1 F (37.3 C)] 98.3 F (36.8 C) (02/03 0756) Pulse Rate:  [59-84] 59 (02/03 0756) Resp:  [15-21] 18 (02/03 0756) BP: (105-150)/(49-79) 110/53 (02/03 0756) SpO2:  [92 %-98 %] 97 % (02/03 0756) Weight:  [90.7 kg (200 lb)-92.7 kg (204 lb 4.8 oz)] 92.7 kg (204 lb 4.8 oz) (02/03 0431)  Weight change:  Filed Weights   08/05/17 1404 08/06/17 0431  Weight: 90.7 kg (200 lb) 92.7 kg (204 lb 4.8 oz)    Intake/Output: I/O last 3 completed shifts: In: 129.8 [I.V.:129.8] Out: 0    Intake/Output this shift:  Total I/O In: 240 [P.O.:240] Out: -   Physical Exam: General: NAD, laying in bed  Head: Normocephalic, atraumatic. Moist oral mucosal membranes  Eyes: Anicteric, PERRL  Neck: Supple, trachea midline  Lungs:  Clear to auscultation  Heart: Regular rate and rhythm  Abdomen:  Soft, nontender, obese  Extremities: no peripheral edema. Right BKA  Neurologic: Nonfocal, moving all four extremities  Skin: No lesions  Access: Left AVF    Basic Metabolic Panel: Recent Labs  Lab 08/05/17 1409 08/06/17 0601  NA 135 134*  K 3.7 3.5  CL 96* 95*  CO2 29 27  GLUCOSE 152* 73  BUN 14 22*  CREATININE 4.18* 6.07*  CALCIUM 8.1* 8.0*    Liver Function Tests: Recent Labs  Lab 08/05/17 1409  AST 27  ALT 21  ALKPHOS 79  BILITOT 0.5  PROT 8.1  ALBUMIN 3.7   Recent Labs  Lab 08/05/17 1409  LIPASE 23   No results for input(s): AMMONIA in the last 168 hours.  CBC: Recent Labs  Lab 08/05/17 1409 08/06/17 0601  WBC 9.0 5.7  HGB 11.2* 10.3*  HCT 34.1* 31.5*  MCV 96.2 97.4  PLT 219 195    Cardiac Enzymes: Recent Labs  Lab 08/05/17 1409 08/05/17 1834  08/05/17 2346 08/06/17 0601  TROPONINI 2.04* 1.78* 1.69* 1.74*    BNP: Invalid input(s): POCBNP  CBG: Recent Labs  Lab 08/05/17 1809 08/05/17 2041 08/06/17 0757 08/06/17 0926 08/06/17 1137  GLUCAP 142* 183* 38* 146* 92    Microbiology: Results for orders placed or performed during the hospital encounter of 08/05/17  MRSA PCR Screening     Status: None   Collection Time: 08/05/17  6:10 PM  Result Value Ref Range Status   MRSA by PCR NEGATIVE NEGATIVE Final    Comment:        The GeneXpert MRSA Assay (FDA approved for NASAL specimens only), is one component of a comprehensive MRSA colonization surveillance program. It is not intended to diagnose MRSA infection nor to guide or monitor treatment for MRSA infections. Performed at [facility], [address] [place], [place] [address]     Coagulation Studies: Recent Labs    08/05/17 1409  LABPROT 15.2  INR 1.21    Urinalysis: No results for input(s): COLORURINE, LABSPEC, PHURINE, GLUCOSEU, HGBUR, BILIRUBINUR, KETONESUR, PROTEINUR, UROBILINOGEN, NITRITE, LEUKOCYTESUR in the last 72 hours.  Invalid input(s): APPERANCEUR    Imaging: Dg Abdomen 1 View  Result Date: 08/05/2017 CLINICAL DATA:  Patient reports abdominal distention and onset of fatigue over the past 3 days. Patient denies abdominal pain and  reports normal BM's. Patient reports last dialysis treatment was yesterday. Hx DM, ESRD. EXAM: ABDOMEN - 1 VIEW COMPARISON:  None. FINDINGS: Bowel gas pattern is nonobstructive. There is significant stool throughout mildly dilated loops of colon. There is significant calcification of the vas deferens and small arteries. No evidence for organomegaly. No abnormal calcifications. IMPRESSION: 1.  No evidence for acute  abnormality. 2. Significant stool burden. Electronically Signed   By: Dennis Walker M.D.   On: 08/05/2017 16:56     Medications:   . sodium chloride     . aspirin EC  81 mg Oral q1800  .  atorvastatin  40 mg Oral q1800  . bumetanide  2 mg Oral BID  . clopidogrel  75 mg Oral q1800  . ferrous sulfate  325 mg Oral BID  . gabapentin  100 mg Oral QPM  . gabapentin  300 mg Oral QHS  . insulin aspart  0-15 Units Subcutaneous TID WC  . insulin detemir  29 Units Subcutaneous QHS  . lactulose  20 g Oral QHS  . [START ON 08/08/2017] lidocaine-prilocaine  1 application Topical Q T,Th,Sa-HD  . lisinopril  5 mg Oral QPM  . Melatonin  5 mg Oral QHS  . polyethylene glycol  17 g Oral q1800  . ranolazine  500 mg Oral BID  . senna  8.6 mg Oral BID  . sertraline  100 mg Oral QHS  . sevelamer carbonate  1,600 mg Oral TID WC  . sodium chloride flush  3 mL Intravenous Q12H  . traZODone  50 mg Oral QHS  . vitamin B-12  1,000 mcg Oral q1800   sodium chloride, acetaminophen **OR** acetaminophen, acetaminophen, bisacodyl, hydrocerin, nitroGLYCERIN, ondansetron **OR** ondansetron (ZOFRAN) IV, oxyCODONE, polyethylene glycol, sodium chloride flush, traMADol  Assessment/ Plan:  [name] is a 67 y.o. black male with end stage renal disease on  Hemodialysis, diabetes mellitus type II, hypertension, peripheral vascular disease, coronary artery disease, hyperlipidemia, congestive heart failure, left BKA admitted on 08/05/2017 for chest pain   TTS [place] left AVF EDW 92kg [facility]  1. End Stage Renal Disease: on hemodialysis. TTS schedule. Last hemodialysis was yesterday as outpatient. No acute indication for dialysis today.  - Continue TTS schedule   2. Hypertension: blood pressure at goal.  - bumetanide and lisinopril.   3. Anemia of chronic kidney disease: hemoglobin 10.3 - EPO with HD treatments.   4. Secondary Hyperparathyroidism: outpatient labs on 07/18/17: PTH 366, calcium 8.5, and phosphorus 3.9 - Sevelamer with meals.   5. Diabetes mellitus type II with chronic kidney disease: insulin dependent.   6. Chest pain with history coronary artery disease: placed on  heparin gtt.  - Appreciate cardiology input   LOS: 1 Dennis Walker 2/3/201911:55 AM

## 2017-08-06 NOTE — Discharge Instructions (Signed)
Resume hemodialysis on your routine schedule.

## 2017-08-21 ENCOUNTER — Encounter (INDEPENDENT_AMBULATORY_CARE_PROVIDER_SITE_OTHER): Payer: Medicare Other

## 2017-08-21 ENCOUNTER — Ambulatory Visit (INDEPENDENT_AMBULATORY_CARE_PROVIDER_SITE_OTHER): Payer: Medicare Other | Admitting: Vascular Surgery

## 2017-08-24 ENCOUNTER — Emergency Department
Admission: EM | Admit: 2017-08-24 | Discharge: 2017-08-24 | Disposition: A | Discharge status: Skilled Nursing Facility | Payer: Medicare Other | Attending: Emergency Medicine | Admitting: Emergency Medicine

## 2017-08-24 DIAGNOSIS — K920 Hematemesis: Secondary | ICD-10-CM

## 2017-08-24 DIAGNOSIS — J45909 Unspecified asthma, uncomplicated: Secondary | ICD-10-CM | POA: Insufficient documentation

## 2017-08-24 DIAGNOSIS — Z992 Dependence on renal dialysis: Secondary | ICD-10-CM | POA: Diagnosis not present

## 2017-08-24 DIAGNOSIS — I259 Chronic ischemic heart disease, unspecified: Secondary | ICD-10-CM | POA: Diagnosis not present

## 2017-08-24 DIAGNOSIS — I509 Heart failure, unspecified: Secondary | ICD-10-CM | POA: Insufficient documentation

## 2017-08-24 DIAGNOSIS — E119 Type 2 diabetes mellitus without complications: Secondary | ICD-10-CM | POA: Diagnosis not present

## 2017-08-24 DIAGNOSIS — Z794 Long term (current) use of insulin: Secondary | ICD-10-CM | POA: Diagnosis not present

## 2017-08-24 DIAGNOSIS — I132 Hypertensive heart and chronic kidney disease with heart failure and with stage 5 chronic kidney disease, or end stage renal disease: Secondary | ICD-10-CM | POA: Insufficient documentation

## 2017-08-24 DIAGNOSIS — Z7902 Long term (current) use of antithrombotics/antiplatelets: Secondary | ICD-10-CM | POA: Diagnosis not present

## 2017-08-24 DIAGNOSIS — R1110 Vomiting, unspecified: Secondary | ICD-10-CM | POA: Diagnosis present

## 2017-08-24 DIAGNOSIS — Z7982 Long term (current) use of aspirin: Secondary | ICD-10-CM | POA: Diagnosis not present

## 2017-08-24 DIAGNOSIS — Z79899 Other long term (current) drug therapy: Secondary | ICD-10-CM | POA: Insufficient documentation

## 2017-08-24 DIAGNOSIS — N186 End stage renal disease: Secondary | ICD-10-CM | POA: Diagnosis not present

## 2017-08-24 LAB — CBC WITH DIFFERENTIAL/PLATELET
BASOS ABS: 0.1 10*3/uL (ref 0–0.1)
BASOS PCT: 1 %
Eosinophils Absolute: 0.1 10*3/uL (ref 0–0.7)
Eosinophils Relative: 1 %
HEMATOCRIT: 26.6 % — AB (ref 40.0–52.0)
HEMOGLOBIN: 8.6 g/dL — AB (ref 13.0–18.0)
LYMPHS PCT: 15 %
Lymphs Abs: 1 10*3/uL (ref 1.0–3.6)
MCH: 30.3 pg (ref 26.0–34.0)
MCHC: 32.4 g/dL (ref 32.0–36.0)
MCV: 93.6 fL (ref 80.0–100.0)
Monocytes Absolute: 0.8 10*3/uL (ref 0.2–1.0)
Monocytes Relative: 11 %
NEUTROS ABS: 5 10*3/uL (ref 1.4–6.5)
NEUTROS PCT: 72 %
Platelets: 178 10*3/uL (ref 150–440)
RBC: 2.85 MIL/uL — AB (ref 4.40–5.90)
RDW: 17.1 % — ABNORMAL HIGH (ref 11.5–14.5)
WBC: 6.9 10*3/uL (ref 3.8–10.6)

## 2017-08-24 LAB — DIFFERENTIAL
BASOS ABS: 0.1 10*3/uL (ref 0–0.1)
BASOS PCT: 1 %
EOS ABS: 0 10*3/uL (ref 0–0.7)
Eosinophils Relative: 0 %
Lymphocytes Relative: 10 %
Lymphs Abs: 0.7 10*3/uL — ABNORMAL LOW (ref 1.0–3.6)
Monocytes Absolute: 0.6 10*3/uL (ref 0.2–1.0)
Monocytes Relative: 8 %
NEUTROS ABS: 5.9 10*3/uL (ref 1.4–6.5)
NEUTROS PCT: 81 %

## 2017-08-24 LAB — COMPREHENSIVE METABOLIC PANEL
ALK PHOS: 66 U/L (ref 38–126)
ALT: 16 U/L — AB (ref 17–63)
ANION GAP: 14 (ref 5–15)
AST: 26 U/L (ref 15–41)
Albumin: 3.7 g/dL (ref 3.5–5.0)
BUN: 91 mg/dL — ABNORMAL HIGH (ref 6–20)
CALCIUM: 8.4 mg/dL — AB (ref 8.9–10.3)
CHLORIDE: 98 mmol/L — AB (ref 101–111)
CO2: 23 mmol/L (ref 22–32)
CREATININE: 6.8 mg/dL — AB (ref 0.61–1.24)
GFR, EST AFRICAN AMERICAN: 9 mL/min — AB (ref >60)
GFR, EST NON AFRICAN AMERICAN: 8 mL/min — AB (ref >60)
Glucose, Bld: 199 mg/dL — ABNORMAL HIGH (ref 65–99)
Potassium: 4.9 mmol/L (ref 3.5–5.1)
SODIUM: 135 mmol/L (ref 135–145)
Total Bilirubin: 0.8 mg/dL (ref 0.3–1.2)
Total Protein: 7.2 g/dL (ref 6.5–8.1)

## 2017-08-24 LAB — CBC
HCT: 28.9 % — ABNORMAL LOW (ref 40.0–52.0)
Hemoglobin: 9.3 g/dL — ABNORMAL LOW (ref 13.0–18.0)
MCH: 30.4 pg (ref 26.0–34.0)
MCHC: 32.2 g/dL (ref 32.0–36.0)
MCV: 94.5 fL (ref 80.0–100.0)
PLATELETS: 210 10*3/uL (ref 150–440)
RBC: 3.06 MIL/uL — AB (ref 4.40–5.90)
RDW: 16.6 % — ABNORMAL HIGH (ref 11.5–14.5)
WBC: 8 10*3/uL (ref 3.8–10.6)

## 2017-08-24 MED ORDER — TRANEXAMIC ACID 1000 MG/10ML IV SOLN
500.0000 mg | Freq: Once | INTRAVENOUS | Status: AC
  Administered 2017-08-24: 500 mg via TOPICAL
  Filled 2017-08-24: qty 10

## 2017-08-24 NOTE — ED Notes (Signed)
Pt gave verbal consent to speak with daughter [name]. Whom states she is POA. This RN stated the pt was stable, and the plan is that d/cwill occur today.

## 2017-08-24 NOTE — ED Triage Notes (Signed)
[facility] Pt states he woke this morning and vomited x4. Pt states the color was dark red, pt states that slight dork red lines spots in vomit. Pt denies pain at this time. Pt is A&Ox4 NAD. Awaiting EDP.

## 2017-08-24 NOTE — ED Notes (Signed)
[place] from Lab states they are running the CBC with Diff at this time.

## 2017-08-24 NOTE — ED Notes (Signed)
This Rn received call from Dr. Sheppard Coil the dentist that did procedure yesterday on Pt. EDP is talking to MD now.

## 2017-08-24 NOTE — ED Provider Notes (Signed)
Signout from Dr. Corky Downs in this 67 year old male with a history of end-stage renal disease who is presenting to the emergency department with one episode of coffee-ground emesis.  The patient had multiple teeth pulled yesterday and had a bleeding socket upon initial examination by Dr. Corky Downs.  The vomited blood was thought to have been digested overnight and vomited back up.  Plan is to check a 4-hour hemoglobin and reassess.  As long as the hemoglobin has not dropped and the patient is stable then the plan is to discharge the patient home.  Physical Exam  BP (!) 175/85   Pulse 82   Temp 98.5 F (36.9 C) (Oral)   Resp 18   Ht 5\' 11"  (1.803 m)   Wt 92.5 kg (204 lb)   SpO2 100%   BMI 28.45 kg/m   Physical Exam  ED Course/Procedures     Procedures  MDM  ED ECG REPORT I, Doran Stabler, the attending physician, personally viewed and interpreted this ECG.   Date: 08/24/2017  EKG Time: 1346  Rate: 82  Rhythm: normal sinus rhythm  Axis: Normal  Intervals:none  ST&T Change: No ST segment elevation or depression.  No abnormal T wave inversion.  ----------------------------------------- 3:59 PM on 08/24/2017 -----------------------------------------  Call received from Dr. Sheppard Coil who is the dentist to pull the patient's teeth yesterday.  He says that he will be in to check the patient and should be in the emergency department in about 20 minutes.  ----------------------------------------- 4:49 PM on 08/24/2017 -----------------------------------------  Dr. Sheppard Coil has evaluated the patient at the bedside.  Does not recommend any further intervention at this time.  Patient to remain on a soft diet.  I will be contacting the renal service as well because the patient has missed dialysis today.  ----------------------------------------- 6:23 PM on 08/24/2017 -----------------------------------------  Discussed the case with Dr. Candiss Norse who says that he will help facilitate a  make up appointment tomorrow.  Patient will also be discharged with instructions for skilled nursing facility to make appointment for tomorrow as well.  ----------------------------------------- 9:20 PM on 08/24/2017 -----------------------------------------  Patient at this time with repeat hemoglobin at 8.6.  This is a very slight drop, less than 1 g from initial today.  Patient has had no further bleeding from his dental sockets.  No vomiting.  No black or bloody stools.  Patient will be discharged at this time.  I also discussed with the patient that I spoke with Dr. Candiss Norse, his nephrologist about a makeup session for dialysis tomorrow.  I will also write on the patient's discharge instructions for his caretakers to make sure to call his dialysis center tomorrow to arrange a makeup session.    Orbie Pyo, MD 08/24/17 2121

## 2017-08-24 NOTE — ED Notes (Signed)
[facility] Sent at this time.
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
## 2017-08-24 NOTE — ED Notes (Signed)
Spoke with Lab again in regards to CBC with Diff. Lab is running them now. RN will monitor.

## 2017-08-24 NOTE — ED Notes (Signed)
Pt changed and blue scrubs on. Pulled up in bed and readjusted

## 2017-08-25 ENCOUNTER — Other Ambulatory Visit: Payer: Self-pay

## 2017-08-25 ENCOUNTER — Emergency Department
Admission: EM | Admit: 2017-08-25 | Discharge: 2017-08-25 | Disposition: A | Discharge status: Home / Self Care | Payer: Medicare Other | Attending: Student in an Organized Health Care Education/Training Program | Admitting: Student in an Organized Health Care Education/Training Program

## 2017-08-25 ENCOUNTER — Emergency Department: Payer: Medicare Other

## 2017-08-25 ENCOUNTER — Encounter: Payer: Self-pay | Admitting: Emergency Medicine

## 2017-08-25 DIAGNOSIS — Z794 Long term (current) use of insulin: Secondary | ICD-10-CM | POA: Insufficient documentation

## 2017-08-25 DIAGNOSIS — M62838 Other muscle spasm: Secondary | ICD-10-CM

## 2017-08-25 DIAGNOSIS — J45909 Unspecified asthma, uncomplicated: Secondary | ICD-10-CM | POA: Insufficient documentation

## 2017-08-25 DIAGNOSIS — I509 Heart failure, unspecified: Secondary | ICD-10-CM | POA: Diagnosis not present

## 2017-08-25 DIAGNOSIS — R531 Weakness: Secondary | ICD-10-CM | POA: Insufficient documentation

## 2017-08-25 DIAGNOSIS — I132 Hypertensive heart and chronic kidney disease with heart failure and with stage 5 chronic kidney disease, or end stage renal disease: Secondary | ICD-10-CM | POA: Insufficient documentation

## 2017-08-25 DIAGNOSIS — Z7982 Long term (current) use of aspirin: Secondary | ICD-10-CM | POA: Diagnosis not present

## 2017-08-25 DIAGNOSIS — N186 End stage renal disease: Secondary | ICD-10-CM | POA: Diagnosis not present

## 2017-08-25 DIAGNOSIS — M79641 Pain in right hand: Secondary | ICD-10-CM | POA: Insufficient documentation

## 2017-08-25 DIAGNOSIS — Z79899 Other long term (current) drug therapy: Secondary | ICD-10-CM | POA: Diagnosis not present

## 2017-08-25 DIAGNOSIS — I2510 Atherosclerotic heart disease of native coronary artery without angina pectoris: Secondary | ICD-10-CM | POA: Diagnosis not present

## 2017-08-25 DIAGNOSIS — Z992 Dependence on renal dialysis: Secondary | ICD-10-CM | POA: Insufficient documentation

## 2017-08-25 DIAGNOSIS — E1122 Type 2 diabetes mellitus with diabetic chronic kidney disease: Secondary | ICD-10-CM | POA: Insufficient documentation

## 2017-08-25 LAB — CBC WITH DIFFERENTIAL/PLATELET
Basophils Absolute: 0.1 10*3/uL (ref 0–0.1)
Basophils Relative: 1 %
Eosinophils Absolute: 0.1 10*3/uL (ref 0–0.7)
Eosinophils Relative: 2 %
HCT: 26.4 % — ABNORMAL LOW (ref 40.0–52.0)
HEMOGLOBIN: 8.7 g/dL — AB (ref 13.0–18.0)
LYMPHS ABS: 0.7 10*3/uL — AB (ref 1.0–3.6)
LYMPHS PCT: 10 %
MCH: 30.7 pg (ref 26.0–34.0)
MCHC: 32.8 g/dL (ref 32.0–36.0)
MCV: 93.4 fL (ref 80.0–100.0)
Monocytes Absolute: 0.5 10*3/uL (ref 0.2–1.0)
Monocytes Relative: 7 %
NEUTROS ABS: 5.3 10*3/uL (ref 1.4–6.5)
NEUTROS PCT: 80 %
Platelets: 181 10*3/uL (ref 150–440)
RBC: 2.82 MIL/uL — AB (ref 4.40–5.90)
RDW: 17.2 % — ABNORMAL HIGH (ref 11.5–14.5)
WBC: 6.7 10*3/uL (ref 3.8–10.6)

## 2017-08-25 LAB — COMPREHENSIVE METABOLIC PANEL
ALBUMIN: 3.6 g/dL (ref 3.5–5.0)
ALK PHOS: 65 U/L (ref 38–126)
ALT: 14 U/L — AB (ref 17–63)
AST: 23 U/L (ref 15–41)
Anion gap: 11 (ref 5–15)
BUN: 48 mg/dL — ABNORMAL HIGH (ref 6–20)
CALCIUM: 8.2 mg/dL — AB (ref 8.9–10.3)
CHLORIDE: 97 mmol/L — AB (ref 101–111)
CO2: 27 mmol/L (ref 22–32)
CREATININE: 4.44 mg/dL — AB (ref 0.61–1.24)
GFR calc Af Amer: 15 mL/min — ABNORMAL LOW (ref >60)
GFR calc non Af Amer: 13 mL/min — ABNORMAL LOW (ref >60)
GLUCOSE: 224 mg/dL — AB (ref 65–99)
Potassium: 3.6 mmol/L (ref 3.5–5.1)
SODIUM: 135 mmol/L (ref 135–145)
Total Bilirubin: 0.5 mg/dL (ref 0.3–1.2)
Total Protein: 6.9 g/dL (ref 6.5–8.1)

## 2017-08-25 MED ORDER — GABAPENTIN 400 MG PO CAPS
400.0000 mg | ORAL_CAPSULE | Freq: Every day | ORAL | Status: DC
  Administered 2017-08-25: 400 mg via ORAL
  Filled 2017-08-25: qty 1

## 2017-08-25 MED ORDER — IPRATROPIUM-ALBUTEROL 0.5-2.5 (3) MG/3ML IN SOLN
3.0000 mL | Freq: Once | RESPIRATORY_TRACT | Status: AC
  Administered 2017-08-25: 3 mL via RESPIRATORY_TRACT
  Filled 2017-08-25: qty 3

## 2017-08-25 MED ORDER — HYDROCODONE-ACETAMINOPHEN 5-325 MG PO TABS
1.0000 | ORAL_TABLET | Freq: Once | ORAL | Status: AC
  Administered 2017-08-25: 1 via ORAL
  Filled 2017-08-25: qty 1

## 2017-08-25 MED ORDER — FENTANYL CITRATE (PF) 100 MCG/2ML IJ SOLN
50.0000 ug | INTRAMUSCULAR | Status: DC | PRN
  Administered 2017-08-25: 50 ug via INTRAVENOUS
  Filled 2017-08-25: qty 2

## 2017-08-25 MED ORDER — LORAZEPAM 0.5 MG PO TABS
0.5000 mg | ORAL_TABLET | Freq: Once | ORAL | Status: AC
  Administered 2017-08-25: 0.5 mg via ORAL
  Filled 2017-08-25: qty 1

## 2017-08-25 MED ORDER — METHYLPREDNISOLONE SODIUM SUCC 125 MG IJ SOLR
80.0000 mg | Freq: Once | INTRAMUSCULAR | Status: AC
  Administered 2017-08-25: 80 mg via INTRAVENOUS
  Filled 2017-08-25: qty 2

## 2017-08-25 NOTE — ED Notes (Signed)
[facility] present to transport pt back to [facility].

## 2017-08-25 NOTE — ED Notes (Signed)

## 2017-08-25 NOTE — ED Triage Notes (Addendum)
Pt arrives via [facility] from [facility]. Per EMS, pt had approx 4 hours of dialysis today. Dialysis called [facility] around noon telling them pt started having jerking motion and c/o pain all over body. Pt returned to [facility] and EMS was called approx 45 mins ago with c/o generalized pain all over body. Denies cough, chills, fevers. Pt currently moaning in pain.   Per EMS and facility, pt is typically talkative and has not been. Pt reports feeling weaker than normal x 2-3 days.
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
## 2017-12-24 ENCOUNTER — Other Ambulatory Visit
Admission: RE | Admit: 2017-12-24 | Discharge: 2017-12-24 | Disposition: A | Discharge status: Home / Self Care | Payer: Medicare Other | Source: Ambulatory Visit | Attending: Family Medicine | Admitting: Family Medicine

## 2017-12-24 ENCOUNTER — Inpatient Hospital Stay
Admission: EM | Admit: 2017-12-24 | Discharge: 2017-12-29 | DRG: 871 | Disposition: A | Discharge status: Skilled Nursing Facility | Payer: Medicare Other | Source: Skilled Nursing Facility | Attending: Internal Medicine | Admitting: Internal Medicine

## 2017-12-24 ENCOUNTER — Other Ambulatory Visit: Payer: Self-pay

## 2017-12-24 ENCOUNTER — Emergency Department: Payer: Medicare Other

## 2017-12-24 ENCOUNTER — Encounter: Payer: Self-pay | Admitting: Intensive Care

## 2017-12-24 DIAGNOSIS — R509 Fever, unspecified: Secondary | ICD-10-CM | POA: Diagnosis present

## 2017-12-24 DIAGNOSIS — A4102 Sepsis due to Methicillin resistant Staphylococcus aureus: Principal | ICD-10-CM | POA: Diagnosis present

## 2017-12-24 DIAGNOSIS — J9601 Acute respiratory failure with hypoxia: Secondary | ICD-10-CM | POA: Diagnosis not present

## 2017-12-24 DIAGNOSIS — I252 Old myocardial infarction: Secondary | ICD-10-CM

## 2017-12-24 DIAGNOSIS — Z9861 Coronary angioplasty status: Secondary | ICD-10-CM

## 2017-12-24 DIAGNOSIS — Y95 Nosocomial condition: Secondary | ICD-10-CM | POA: Diagnosis present

## 2017-12-24 DIAGNOSIS — I2510 Atherosclerotic heart disease of native coronary artery without angina pectoris: Secondary | ICD-10-CM | POA: Diagnosis present

## 2017-12-24 DIAGNOSIS — E1151 Type 2 diabetes mellitus with diabetic peripheral angiopathy without gangrene: Secondary | ICD-10-CM | POA: Diagnosis present

## 2017-12-24 DIAGNOSIS — Z79899 Other long term (current) drug therapy: Secondary | ICD-10-CM

## 2017-12-24 DIAGNOSIS — R531 Weakness: Secondary | ICD-10-CM | POA: Insufficient documentation

## 2017-12-24 DIAGNOSIS — J9690 Respiratory failure, unspecified, unspecified whether with hypoxia or hypercapnia: Secondary | ICD-10-CM

## 2017-12-24 DIAGNOSIS — D631 Anemia in chronic kidney disease: Secondary | ICD-10-CM | POA: Diagnosis present

## 2017-12-24 DIAGNOSIS — I872 Venous insufficiency (chronic) (peripheral): Secondary | ICD-10-CM | POA: Diagnosis present

## 2017-12-24 DIAGNOSIS — N186 End stage renal disease: Secondary | ICD-10-CM | POA: Diagnosis present

## 2017-12-24 DIAGNOSIS — I69398 Other sequelae of cerebral infarction: Secondary | ICD-10-CM | POA: Diagnosis not present

## 2017-12-24 DIAGNOSIS — Z79891 Long term (current) use of opiate analgesic: Secondary | ICD-10-CM

## 2017-12-24 DIAGNOSIS — Z823 Family history of stroke: Secondary | ICD-10-CM

## 2017-12-24 DIAGNOSIS — I5032 Chronic diastolic (congestive) heart failure: Secondary | ICD-10-CM | POA: Diagnosis present

## 2017-12-24 DIAGNOSIS — Z882 Allergy status to sulfonamides status: Secondary | ICD-10-CM

## 2017-12-24 DIAGNOSIS — J181 Lobar pneumonia, unspecified organism: Secondary | ICD-10-CM | POA: Diagnosis present

## 2017-12-24 DIAGNOSIS — Z89511 Acquired absence of right leg below knee: Secondary | ICD-10-CM

## 2017-12-24 DIAGNOSIS — M6248 Contracture of muscle, other site: Secondary | ICD-10-CM | POA: Diagnosis present

## 2017-12-24 DIAGNOSIS — Z992 Dependence on renal dialysis: Secondary | ICD-10-CM | POA: Diagnosis not present

## 2017-12-24 DIAGNOSIS — R7881 Bacteremia: Secondary | ICD-10-CM | POA: Diagnosis not present

## 2017-12-24 DIAGNOSIS — J9621 Acute and chronic respiratory failure with hypoxia: Secondary | ICD-10-CM | POA: Diagnosis present

## 2017-12-24 DIAGNOSIS — M546 Pain in thoracic spine: Secondary | ICD-10-CM | POA: Diagnosis not present

## 2017-12-24 DIAGNOSIS — J45909 Unspecified asthma, uncomplicated: Secondary | ICD-10-CM | POA: Diagnosis present

## 2017-12-24 DIAGNOSIS — Z7902 Long term (current) use of antithrombotics/antiplatelets: Secondary | ICD-10-CM

## 2017-12-24 DIAGNOSIS — E669 Obesity, unspecified: Secondary | ICD-10-CM | POA: Diagnosis present

## 2017-12-24 DIAGNOSIS — Z8612 Personal history of poliomyelitis: Secondary | ICD-10-CM

## 2017-12-24 DIAGNOSIS — J9622 Acute and chronic respiratory failure with hypercapnia: Secondary | ICD-10-CM | POA: Diagnosis present

## 2017-12-24 DIAGNOSIS — I10 Essential (primary) hypertension: Secondary | ICD-10-CM

## 2017-12-24 DIAGNOSIS — Z91013 Allergy to seafood: Secondary | ICD-10-CM

## 2017-12-24 DIAGNOSIS — I132 Hypertensive heart and chronic kidney disease with heart failure and with stage 5 chronic kidney disease, or end stage renal disease: Secondary | ICD-10-CM | POA: Diagnosis present

## 2017-12-24 DIAGNOSIS — E785 Hyperlipidemia, unspecified: Secondary | ICD-10-CM | POA: Diagnosis present

## 2017-12-24 DIAGNOSIS — G9341 Metabolic encephalopathy: Secondary | ICD-10-CM | POA: Diagnosis present

## 2017-12-24 DIAGNOSIS — N390 Urinary tract infection, site not specified: Secondary | ICD-10-CM | POA: Insufficient documentation

## 2017-12-24 DIAGNOSIS — F329 Major depressive disorder, single episode, unspecified: Secondary | ICD-10-CM | POA: Diagnosis present

## 2017-12-24 DIAGNOSIS — R6521 Severe sepsis with septic shock: Secondary | ICD-10-CM | POA: Diagnosis present

## 2017-12-24 DIAGNOSIS — Z881 Allergy status to other antibiotic agents status: Secondary | ICD-10-CM

## 2017-12-24 DIAGNOSIS — Z794 Long term (current) use of insulin: Secondary | ICD-10-CM

## 2017-12-24 DIAGNOSIS — Z9981 Dependence on supplemental oxygen: Secondary | ICD-10-CM | POA: Diagnosis not present

## 2017-12-24 DIAGNOSIS — E1122 Type 2 diabetes mellitus with diabetic chronic kidney disease: Secondary | ICD-10-CM | POA: Diagnosis present

## 2017-12-24 DIAGNOSIS — N2581 Secondary hyperparathyroidism of renal origin: Secondary | ICD-10-CM | POA: Diagnosis present

## 2017-12-24 DIAGNOSIS — Z7982 Long term (current) use of aspirin: Secondary | ICD-10-CM

## 2017-12-24 DIAGNOSIS — J189 Pneumonia, unspecified organism: Secondary | ICD-10-CM | POA: Diagnosis not present

## 2017-12-24 DIAGNOSIS — A4101 Sepsis due to Methicillin susceptible Staphylococcus aureus: Secondary | ICD-10-CM | POA: Diagnosis not present

## 2017-12-24 DIAGNOSIS — Z6830 Body mass index (BMI) 30.0-30.9, adult: Secondary | ICD-10-CM

## 2017-12-24 DIAGNOSIS — Z4659 Encounter for fitting and adjustment of other gastrointestinal appliance and device: Secondary | ICD-10-CM

## 2017-12-24 DIAGNOSIS — R4182 Altered mental status, unspecified: Secondary | ICD-10-CM

## 2017-12-24 LAB — CBC WITH DIFFERENTIAL/PLATELET
BASOS ABS: 0 10*3/uL (ref 0–0.1)
Basophils Relative: 0 %
Eosinophils Absolute: 0 10*3/uL (ref 0–0.7)
Eosinophils Relative: 0 %
HEMATOCRIT: 40.7 % (ref 40.0–52.0)
HEMOGLOBIN: 13.4 g/dL (ref 13.0–18.0)
Lymphocytes Relative: 4 %
Lymphs Abs: 0.5 10*3/uL — ABNORMAL LOW (ref 1.0–3.6)
MCH: 27.9 pg (ref 26.0–34.0)
MCHC: 32.9 g/dL (ref 32.0–36.0)
MCV: 84.6 fL (ref 80.0–100.0)
MONOS PCT: 5 %
Monocytes Absolute: 0.5 10*3/uL (ref 0.2–1.0)
NEUTROS ABS: 10.3 10*3/uL — AB (ref 1.4–6.5)
NEUTROS PCT: 91 %
Platelets: 122 10*3/uL — ABNORMAL LOW (ref 150–440)
RBC: 4.81 MIL/uL (ref 4.40–5.90)
RDW: 18.6 % — ABNORMAL HIGH (ref 11.5–14.5)
WBC: 11.3 10*3/uL — ABNORMAL HIGH (ref 3.8–10.6)

## 2017-12-24 LAB — BLOOD GAS, ARTERIAL
Acid-Base Excess: 3 mmol/L — ABNORMAL HIGH (ref 0.0–2.0)
BICARBONATE: 25.5 mmol/L (ref 20.0–28.0)
FIO2: 0.4
LHR: 16 {breaths}/min
MECHVT: 500 mL
O2 SAT: 98.4 %
PATIENT TEMPERATURE: 40
PEEP/CPAP: 5 cmH2O
PO2 ART: 121 mmHg — AB (ref 83.0–108.0)
pCO2 arterial: 36 mmHg (ref 32.0–48.0)
pH, Arterial: 7.46 — ABNORMAL HIGH (ref 7.350–7.450)

## 2017-12-24 LAB — COMPREHENSIVE METABOLIC PANEL
ALK PHOS: 67 U/L (ref 38–126)
ALT: 16 U/L — ABNORMAL LOW (ref 17–63)
AST: 27 U/L (ref 15–41)
Albumin: 3.9 g/dL (ref 3.5–5.0)
Anion gap: 11 (ref 5–15)
BUN: 27 mg/dL — AB (ref 6–20)
CALCIUM: 8.6 mg/dL — AB (ref 8.9–10.3)
CO2: 25 mmol/L (ref 22–32)
CREATININE: 5.3 mg/dL — AB (ref 0.61–1.24)
Chloride: 96 mmol/L — ABNORMAL LOW (ref 101–111)
GFR, EST AFRICAN AMERICAN: 12 mL/min — AB (ref >60)
GFR, EST NON AFRICAN AMERICAN: 10 mL/min — AB (ref >60)
GLUCOSE: 113 mg/dL — AB (ref 65–99)
Potassium: 3.7 mmol/L (ref 3.5–5.1)
Sodium: 132 mmol/L — ABNORMAL LOW (ref 135–145)
TOTAL PROTEIN: 7.6 g/dL (ref 6.5–8.1)
Total Bilirubin: 0.9 mg/dL (ref 0.3–1.2)

## 2017-12-24 LAB — URINALYSIS, COMPLETE (UACMP) WITH MICROSCOPIC
BACTERIA UA: NONE SEEN
BILIRUBIN URINE: NEGATIVE
Bilirubin Urine: NEGATIVE
GLUCOSE, UA: NEGATIVE mg/dL
Glucose, UA: NEGATIVE mg/dL
KETONES UR: NEGATIVE mg/dL
KETONES UR: NEGATIVE mg/dL
LEUKOCYTES UA: NEGATIVE
LEUKOCYTES UA: NEGATIVE
Nitrite: NEGATIVE
Nitrite: NEGATIVE
PH: 5 (ref 5.0–8.0)
RBC / HPF: >50 RBC/hpf — ABNORMAL HIGH (ref 0–5)
Specific Gravity, Urine: 1.022 (ref 1.005–1.030)
Specific Gravity, Urine: 1.022 (ref 1.005–1.030)
Squamous Epithelial / LPF: NONE SEEN (ref 0–5)
pH: 5 (ref 5.0–8.0)

## 2017-12-24 LAB — BLOOD CULTURE ID PANEL (REFLEXED)
Acinetobacter baumannii: NOT DETECTED
CANDIDA GLABRATA: NOT DETECTED
CANDIDA KRUSEI: NOT DETECTED
Candida albicans: NOT DETECTED
Candida parapsilosis: NOT DETECTED
Candida tropicalis: NOT DETECTED
ENTEROBACTER CLOACAE COMPLEX: NOT DETECTED
ENTEROCOCCUS SPECIES: NOT DETECTED
Enterobacteriaceae species: NOT DETECTED
Escherichia coli: NOT DETECTED
HAEMOPHILUS INFLUENZAE: NOT DETECTED
KLEBSIELLA PNEUMONIAE: NOT DETECTED
Klebsiella oxytoca: NOT DETECTED
LISTERIA MONOCYTOGENES: NOT DETECTED
Methicillin resistance: DETECTED — AB
NEISSERIA MENINGITIDIS: NOT DETECTED
PSEUDOMONAS AERUGINOSA: NOT DETECTED
Proteus species: NOT DETECTED
SERRATIA MARCESCENS: NOT DETECTED
STREPTOCOCCUS AGALACTIAE: NOT DETECTED
STREPTOCOCCUS PNEUMONIAE: NOT DETECTED
STREPTOCOCCUS PYOGENES: NOT DETECTED
Staphylococcus aureus (BCID): DETECTED — AB
Staphylococcus species: DETECTED — AB
Streptococcus species: NOT DETECTED

## 2017-12-24 LAB — MRSA PCR SCREENING: MRSA BY PCR: NEGATIVE

## 2017-12-24 LAB — URINE DRUG SCREEN, QUALITATIVE (ARMC ONLY)
Amphetamines, Ur Screen: NOT DETECTED
Benzodiazepine, Ur Scrn: NOT DETECTED
CANNABINOID 50 NG, UR ~~LOC~~: NOT DETECTED
Cocaine Metabolite,Ur ~~LOC~~: NOT DETECTED
MDMA (ECSTASY) UR SCREEN: NOT DETECTED
Methadone Scn, Ur: NOT DETECTED
Opiate, Ur Screen: NOT DETECTED
Phencyclidine (PCP) Ur S: NOT DETECTED
Tricyclic, Ur Screen: NOT DETECTED

## 2017-12-24 LAB — GLUCOSE, CAPILLARY
GLUCOSE-CAPILLARY: 120 mg/dL — AB (ref 65–99)
GLUCOSE-CAPILLARY: 109 mg/dL — AB (ref 65–99)
Glucose-Capillary: 110 mg/dL — ABNORMAL HIGH (ref 65–99)
Glucose-Capillary: 99 mg/dL (ref 65–99)

## 2017-12-24 LAB — AMMONIA: Ammonia: 48 umol/L — ABNORMAL HIGH (ref 9–35)

## 2017-12-24 LAB — TRIGLYCERIDES: TRIGLYCERIDES: 77 mg/dL (ref <150)

## 2017-12-24 LAB — BLOOD GAS, VENOUS
ACID-BASE EXCESS: 6.3 mmol/L — AB (ref 0.0–2.0)
Bicarbonate: 31.3 mmol/L — ABNORMAL HIGH (ref 20.0–28.0)
O2 Saturation: 86.2 %
PCO2 VEN: 45 mmHg (ref 44.0–60.0)
PO2 VEN: 49 mmHg — AB (ref 32.0–45.0)
Patient temperature: 37
pH, Ven: 7.45 — ABNORMAL HIGH (ref 7.250–7.430)

## 2017-12-24 LAB — PROTIME-INR
INR: 1.17
PROTHROMBIN TIME: 14.8 s (ref 11.4–15.2)

## 2017-12-24 LAB — LACTIC ACID, PLASMA
LACTIC ACID, VENOUS: 1.5 mmol/L (ref 0.5–1.9)
Lactic Acid, Venous: 0.9 mmol/L (ref 0.5–1.9)

## 2017-12-24 LAB — ETHANOL

## 2017-12-24 LAB — TSH: TSH: 1.196 u[IU]/mL (ref 0.350–4.500)

## 2017-12-24 LAB — STREP PNEUMONIAE URINARY ANTIGEN: STREP PNEUMO URINARY ANTIGEN: NEGATIVE

## 2017-12-24 MED ORDER — PROPOFOL 1000 MG/100ML IV EMUL: 5.0000 ug/kg/min | Freq: Once | INTRAVENOUS | Status: DC

## 2017-12-24 MED ORDER — MIDAZOLAM HCL 2 MG/2ML IJ SOLN
1.0000 mg | INTRAMUSCULAR | Status: DC | PRN
  Administered 2017-12-25 (×2): 1 mg via INTRAVENOUS
  Filled 2017-12-24 (×2): qty 2

## 2017-12-24 MED ORDER — VANCOMYCIN HCL IN DEXTROSE 1-5 GM/200ML-% IV SOLN
1000.0000 mg | Freq: Once | INTRAVENOUS | Status: AC
  Administered 2017-12-24: 1000 mg via INTRAVENOUS
  Filled 2017-12-24: qty 200

## 2017-12-24 MED ORDER — PROPOFOL 1000 MG/100ML IV EMUL
5.0000 ug/kg/min | INTRAVENOUS | Status: DC
  Administered 2017-12-24: 10 ug/kg/min via INTRAVENOUS

## 2017-12-24 MED ORDER — NALOXONE HCL 2 MG/2ML IJ SOSY
PREFILLED_SYRINGE | INTRAMUSCULAR | Status: AC
  Filled 2017-12-24: qty 2

## 2017-12-24 MED ORDER — AZTREONAM 2 G IJ SOLR
2.0000 g | Freq: Once | INTRAMUSCULAR | Status: AC
  Administered 2017-12-24: 2 g via INTRAVENOUS
  Filled 2017-12-24: qty 2

## 2017-12-24 MED ORDER — BISACODYL 10 MG RE SUPP: 10.0000 mg | Freq: Every day | RECTAL | Status: DC | PRN

## 2017-12-24 MED ORDER — SODIUM CHLORIDE 0.9 % IV SOLN
1.0000 g | Freq: Three times a day (TID) | INTRAVENOUS | Status: DC
  Administered 2017-12-24: 1 g via INTRAVENOUS
  Filled 2017-12-24 (×3): qty 1

## 2017-12-24 MED ORDER — HYDRALAZINE HCL 20 MG/ML IJ SOLN: 10.0000 mg | INTRAMUSCULAR | Status: DC | PRN

## 2017-12-24 MED ORDER — SODIUM CHLORIDE 0.9 % IV SOLN
INTRAVENOUS | Status: DC
  Administered 2017-12-24 – 2017-12-25 (×2): via INTRAVENOUS

## 2017-12-24 MED ORDER — ONDANSETRON HCL 4 MG/2ML IJ SOLN
4.0000 mg | Freq: Once | INTRAMUSCULAR | Status: AC
  Administered 2017-12-24: 4 mg via INTRAVENOUS

## 2017-12-24 MED ORDER — ROCURONIUM BROMIDE 50 MG/5ML IV SOLN
100.0000 mg | Freq: Once | INTRAVENOUS | Status: AC
  Administered 2017-12-24: 100 mg via INTRAVENOUS
  Filled 2017-12-24: qty 10

## 2017-12-24 MED ORDER — CHLORHEXIDINE GLUCONATE 0.12% ORAL RINSE (MEDLINE KIT)
15.0000 mL | Freq: Two times a day (BID) | OROMUCOSAL | Status: DC
  Administered 2017-12-24 – 2017-12-29 (×9): 15 mL via OROMUCOSAL

## 2017-12-24 MED ORDER — NOREPINEPHRINE 4 MG/250ML-% IV SOLN
INTRAVENOUS | Status: AC
  Filled 2017-12-24: qty 250

## 2017-12-24 MED ORDER — FENTANYL 2500MCG IN NS 250ML (10MCG/ML) PREMIX INFUSION
25.0000 ug/h | INTRAVENOUS | Status: DC
  Administered 2017-12-24: 75 ug/h via INTRAVENOUS
  Administered 2017-12-25 – 2017-12-26 (×2): 200 ug/h via INTRAVENOUS
  Filled 2017-12-24 (×3): qty 250

## 2017-12-24 MED ORDER — ORAL CARE MOUTH RINSE
15.0000 mL | OROMUCOSAL | Status: DC
  Administered 2017-12-24 – 2017-12-29 (×36): 15 mL via OROMUCOSAL

## 2017-12-24 MED ORDER — ACETAMINOPHEN 325 MG PO TABS: 650.0000 mg | ORAL_TABLET | Freq: Four times a day (QID) | ORAL | Status: DC | PRN

## 2017-12-24 MED ORDER — PROPOFOL 10 MG/ML IV BOLUS
40.0000 mg | Freq: Once | INTRAVENOUS | Status: AC
  Administered 2017-12-24: 40 mg via INTRAVENOUS

## 2017-12-24 MED ORDER — MIDAZOLAM HCL 2 MG/2ML IJ SOLN
1.0000 mg | INTRAMUSCULAR | Status: DC | PRN
  Administered 2017-12-25 – 2017-12-27 (×6): 1 mg via INTRAVENOUS
  Filled 2017-12-24 (×6): qty 2

## 2017-12-24 MED ORDER — ALBUTEROL SULFATE (2.5 MG/3ML) 0.083% IN NEBU: 2.5000 mg | INHALATION_SOLUTION | RESPIRATORY_TRACT | Status: DC | PRN

## 2017-12-24 MED ORDER — PROPOFOL 1000 MG/100ML IV EMUL
5.0000 ug/kg/min | INTRAVENOUS | Status: DC
  Administered 2017-12-25: 25 ug/kg/min via INTRAVENOUS
  Filled 2017-12-24: qty 100

## 2017-12-24 MED ORDER — HEPARIN SODIUM (PORCINE) 5000 UNIT/ML IJ SOLN
5000.0000 [IU] | Freq: Three times a day (TID) | INTRAMUSCULAR | Status: DC
  Administered 2017-12-24 – 2017-12-29 (×14): 5000 [IU] via SUBCUTANEOUS
  Filled 2017-12-24 (×14): qty 1

## 2017-12-24 MED ORDER — ACETAMINOPHEN 650 MG RE SUPP
650.0000 mg | Freq: Once | RECTAL | Status: AC
  Administered 2017-12-24: 650 mg via RECTAL

## 2017-12-24 MED ORDER — VANCOMYCIN HCL IN DEXTROSE 1-5 GM/200ML-% IV SOLN
1000.0000 mg | INTRAVENOUS | Status: DC
  Administered 2017-12-24: 1000 mg via INTRAVENOUS
  Filled 2017-12-24: qty 200

## 2017-12-24 MED ORDER — SENNOSIDES 8.8 MG/5ML PO SYRP
5.0000 mL | ORAL_SOLUTION | Freq: Two times a day (BID) | ORAL | Status: DC | PRN
  Filled 2017-12-24: qty 5

## 2017-12-24 MED ORDER — FENTANYL CITRATE (PF) 100 MCG/2ML IJ SOLN
100.0000 ug | Freq: Once | INTRAMUSCULAR | Status: AC
  Administered 2017-12-24: 100 ug via INTRAVENOUS

## 2017-12-24 MED ORDER — ETOMIDATE 2 MG/ML IV SOLN
20.0000 mg | Freq: Once | INTRAVENOUS | Status: AC
  Administered 2017-12-24: 20 mg via INTRAVENOUS

## 2017-12-24 MED ORDER — NALOXONE HCL 2 MG/2ML IJ SOSY
2.0000 mg | PREFILLED_SYRINGE | Freq: Once | INTRAMUSCULAR | Status: AC
  Administered 2017-12-24: 2 mg via INTRAVENOUS

## 2017-12-24 MED ORDER — ONDANSETRON HCL 4 MG/2ML IJ SOLN: 4.0000 mg | Freq: Four times a day (QID) | INTRAMUSCULAR | Status: DC | PRN

## 2017-12-24 MED ORDER — ACETAMINOPHEN 650 MG RE SUPP
RECTAL | Status: AC
  Filled 2017-12-24: qty 1

## 2017-12-24 MED ORDER — IBUPROFEN 400 MG PO TABS
400.0000 mg | ORAL_TABLET | ORAL | Status: DC | PRN
  Administered 2017-12-24 – 2017-12-26 (×4): 400 mg via ORAL
  Filled 2017-12-24 (×4): qty 1

## 2017-12-24 MED ORDER — LEVOFLOXACIN IN D5W 750 MG/150ML IV SOLN
750.0000 mg | Freq: Once | INTRAVENOUS | Status: AC
  Administered 2017-12-24: 750 mg via INTRAVENOUS
  Filled 2017-12-24: qty 150

## 2017-12-24 MED ORDER — PROPOFOL 1000 MG/100ML IV EMUL
5.0000 ug/kg/min | Freq: Once | INTRAVENOUS | Status: AC
  Administered 2017-12-24: 5 ug/kg/min via INTRAVENOUS

## 2017-12-24 MED ORDER — NOREPINEPHRINE 4 MG/250ML-% IV SOLN
0.0000 ug/min | INTRAVENOUS | Status: DC
  Administered 2017-12-24: 10 ug/min via INTRAVENOUS
  Administered 2017-12-26: 2 ug/min via INTRAVENOUS
  Filled 2017-12-24: qty 250

## 2017-12-24 MED ORDER — PROPOFOL 1000 MG/100ML IV EMUL
INTRAVENOUS | Status: AC
  Administered 2017-12-24: 5 ug/kg/min via INTRAVENOUS
  Filled 2017-12-24: qty 100

## 2017-12-24 MED ORDER — FENTANYL BOLUS VIA INFUSION
25.0000 ug | INTRAVENOUS | Status: DC | PRN
  Administered 2017-12-25 (×3): 50 ug via INTRAVENOUS
  Filled 2017-12-24: qty 50

## 2017-12-24 MED ORDER — SODIUM CHLORIDE 0.9 % IV BOLUS
500.0000 mL | Freq: Once | INTRAVENOUS | Status: AC
  Administered 2017-12-24: 500 mL via INTRAVENOUS

## 2017-12-24 MED ORDER — ONDANSETRON HCL 4 MG/2ML IJ SOLN
INTRAMUSCULAR | Status: AC
  Filled 2017-12-24: qty 2

## 2017-12-24 MED ORDER — ACETAMINOPHEN 650 MG RE SUPP: 650.0000 mg | Freq: Four times a day (QID) | RECTAL | Status: DC | PRN

## 2017-12-24 MED ORDER — ONDANSETRON HCL 4 MG PO TABS: 4.0000 mg | ORAL_TABLET | Freq: Four times a day (QID) | ORAL | Status: DC | PRN

## 2017-12-24 NOTE — Progress Notes (Signed)
   12/24/17 1133  Clinical Encounter Type  Visited With Patient  Visit Type ED;Initial  Referral From Nurse  Consult/Referral To Chaplain   [facility] received page from ED about pt.  Nurse requested [place] to sit with family in family room. When [facility] arrived, I checked in with nurse to inquire about pt's condition and the situation.  Nurse informed [place] that family was no longer in waiting room and she wasn't sure where they went.  [place] checked in lobby and checked with lobby desk, but family was [place]. [place] told nurse and lobby staff to contact [facility] if family reappears and needed company.

## 2017-12-24 NOTE — Progress Notes (Signed)
Pharmacy Antibiotic Note  Dennis Walker is a 67 y.o. male admitted on 12/24/2017 with pneumonia.  Pharmacy has been consulted for aztreonam and vancomycin dosing.  Plan: Vancomycin 1000mg  IV every 48 hours.  Goal trough 15-20 mcg/mL. aztreonam 1gm iv q8h  Height: 6' (182.9 cm) Weight: 180 lb (81.6 kg) IBW/kg (Calculated) : 77.6  Temp (24hrs), Avg:103.6 F (39.8 C), Min:102.2 F (39 C), Max:103.9 F (39.9 C)  Recent Labs  Lab 12/24/17 1053 12/24/17 1057  WBC 11.3*  --   CREATININE 5.30*  --   LATICACIDVEN  --  0.9    Estimated Creatinine Clearance: 15.3 mL/min (A) (by C-G formula based on SCr of 5.3 mg/dL (H)).    Allergies  Allergen Reactions  . Cefuroxime Itching  . Septra Ds  [Sulfamethoxazole-Trimethoprim]     Other reaction(s): Itching  . Shrimp [Shellfish Allergy] Other (See Comments)    Per mar. Unknown reaction   . Sulfa Antibiotics Other (See Comments)    Per mar. Unknown reaction    Antimicrobials this admission: Anti-infectives (From admission, onward)   Start     Dose/Rate Route Frequency Ordered Stop   12/24/17 1900  aztreonam (AZACTAM) 1 g in sodium chloride 0.9 % 100 mL IVPB     1 g 200 mL/hr over 30 Minutes Intravenous Every 8 hours 12/24/17 1352     12/24/17 1700  vancomycin (VANCOCIN) IVPB 1000 mg/200 mL premix     1,000 mg 200 mL/hr over 60 Minutes Intravenous Every 48 hours 12/24/17 1355     12/24/17 1230  levofloxacin (LEVAQUIN) IVPB 750 mg     750 mg 100 mL/hr over 90 Minutes Intravenous  Once 12/24/17 1223     12/24/17 1230  aztreonam (AZACTAM) 2 g in sodium chloride 0.9 % 100 mL IVPB     2 g 200 mL/hr over 30 Minutes Intravenous  Once 12/24/17 1223 12/24/17 1313   12/24/17 1145  vancomycin (VANCOCIN) IVPB 1000 mg/200 mL premix     1,000 mg 200 mL/hr over 60 Minutes Intravenous  Once 12/24/17 1132 12/24/17 1240       Microbiology results: No results found for this or any previous visit (from the past 240 hour(s)).  Thank you for  allowing pharmacy to be a part of this patient's care.  Donna Christen Callie Bunyard 12/24/2017 1:57 PM
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
## 2017-12-24 NOTE — ED Notes (Signed)
MD notified of patient's ongoing increasing temp via temp foley. VORB for rectal tylenol.

## 2017-12-24 NOTE — ED Notes (Signed)
MD made aware that patient continues to be hypertensive. VORB for 40mg  bolus of Propofol.

## 2017-12-24 NOTE — Progress Notes (Signed)
RT at bedside to assist with intubation. Patient intubated with a 7.5 oral ET tube and glidescope by Dr. Burlene Arnt on first attempt with no complications. Positive color change on capnometer, condensation present in tube with ventilation, and Bilateral breath sounds auscultated by Jinny Blossom RN. Tube secured with tube holder at 26cm a the lip.

## 2017-12-24 NOTE — ED Notes (Signed)
X-ray at bedside
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
## 2017-12-24 NOTE — Progress Notes (Signed)
ET tube pulled back to 24cm at the lip, post intubation CXR per Dr.McShanes request.
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
## 2017-12-24 NOTE — Progress Notes (Signed)
PHARMACY - PHYSICIAN COMMUNICATION CRITICAL VALUE ALERT - BLOOD CULTURE IDENTIFICATION (BCID)  Dennis Walker is an 67 y.o. male who presented to [facility] on 12/24/2017 with a chief complaint of sepsis  Assessment:  Gram + cocci, staph species, staph aureus, meca detected (include suspected source if known)  Name of physician (or Provider) Contacted: Magdalena in ICU  Current antibiotics: aztreonam and vancomycin  Changes to prescribed antibiotics recommended:  Patient is on recommended antibiotics - No changes needed, spoke with magdalena   No results found for this or any previous visit.  Donna Christen Kveon Casanas 12/24/2017  9:26 PM

## 2017-12-25 ENCOUNTER — Inpatient Hospital Stay: Payer: Medicare Other

## 2017-12-25 DIAGNOSIS — R7881 Bacteremia: Secondary | ICD-10-CM

## 2017-12-25 DIAGNOSIS — A4101 Sepsis due to Methicillin susceptible Staphylococcus aureus: Secondary | ICD-10-CM

## 2017-12-25 DIAGNOSIS — J189 Pneumonia, unspecified organism: Secondary | ICD-10-CM

## 2017-12-25 LAB — RENAL FUNCTION PANEL
ALBUMIN: 2.9 g/dL — AB (ref 3.5–5.0)
Anion gap: 12 (ref 5–15)
BUN: 36 mg/dL — ABNORMAL HIGH (ref 6–20)
CO2: 23 mmol/L (ref 22–32)
Calcium: 7.8 mg/dL — ABNORMAL LOW (ref 8.9–10.3)
Chloride: 97 mmol/L — ABNORMAL LOW (ref 101–111)
Creatinine, Ser: 6.27 mg/dL — ABNORMAL HIGH (ref 0.61–1.24)
GFR calc Af Amer: 10 mL/min — ABNORMAL LOW (ref >60)
GFR calc non Af Amer: 8 mL/min — ABNORMAL LOW (ref >60)
GLUCOSE: 111 mg/dL — AB (ref 65–99)
PHOSPHORUS: 2.5 mg/dL (ref 2.5–4.6)
Potassium: 3.7 mmol/L (ref 3.5–5.1)
Sodium: 132 mmol/L — ABNORMAL LOW (ref 135–145)

## 2017-12-25 LAB — URINE CULTURE
Culture: NO GROWTH
Culture: NO GROWTH

## 2017-12-25 LAB — GLUCOSE, CAPILLARY
GLUCOSE-CAPILLARY: 76 mg/dL (ref 65–99)
Glucose-Capillary: 100 mg/dL — ABNORMAL HIGH (ref 65–99)
Glucose-Capillary: 74 mg/dL (ref 65–99)
Glucose-Capillary: 70 mg/dL (ref 65–99)
Glucose-Capillary: 137 mg/dL — ABNORMAL HIGH (ref 65–99)
Glucose-Capillary: 90 mg/dL (ref 65–99)

## 2017-12-25 LAB — CBC
HEMATOCRIT: 43.5 % (ref 40.0–52.0)
HEMOGLOBIN: 14.2 g/dL (ref 13.0–18.0)
MCH: 28 pg (ref 26.0–34.0)
MCHC: 32.6 g/dL (ref 32.0–36.0)
MCV: 85.9 fL (ref 80.0–100.0)
Platelets: 93 10*3/uL — ABNORMAL LOW (ref 150–440)
RBC: 5.07 MIL/uL (ref 4.40–5.90)
RDW: 19.5 % — AB (ref 11.5–14.5)
WBC: 8.9 10*3/uL (ref 3.8–10.6)

## 2017-12-25 MED ORDER — B COMPLEX-C PO TABS
1.0000 | ORAL_TABLET | Freq: Every day | ORAL | Status: DC
  Administered 2017-12-25 – 2017-12-26 (×2): 1
  Filled 2017-12-25 (×5): qty 1

## 2017-12-25 MED ORDER — PIPERACILLIN-TAZOBACTAM 3.375 G IVPB
3.3750 g | Freq: Two times a day (BID) | INTRAVENOUS | Status: DC
  Administered 2017-12-25: 3.375 g via INTRAVENOUS
  Filled 2017-12-25: qty 50

## 2017-12-25 MED ORDER — DEXTROSE 50 % IV SOLN
1.0000 | Freq: Once | INTRAVENOUS | Status: AC
  Administered 2017-12-25: 50 mL via INTRAVENOUS
  Filled 2017-12-25: qty 50

## 2017-12-25 MED ORDER — VANCOMYCIN HCL IN DEXTROSE 750-5 MG/150ML-% IV SOLN
750.0000 mg | Freq: Once | INTRAVENOUS | Status: AC
  Administered 2017-12-25: 750 mg via INTRAVENOUS
  Filled 2017-12-25: qty 150

## 2017-12-25 MED ORDER — DIPHENHYDRAMINE HCL 50 MG/ML IJ SOLN
12.5000 mg | Freq: Four times a day (QID) | INTRAMUSCULAR | Status: DC | PRN
Start: 2017-12-25 — End: 2017-12-29
  Administered 2017-12-25 – 2017-12-26 (×2): 12.5 mg via INTRAVENOUS
  Filled 2017-12-25 (×2): qty 1

## 2017-12-25 MED ORDER — VANCOMYCIN HCL IN DEXTROSE 1-5 GM/200ML-% IV SOLN
1000.0000 mg | INTRAVENOUS | Status: DC
  Administered 2017-12-26 – 2017-12-28 (×2): 1000 mg via INTRAVENOUS
  Filled 2017-12-25 (×3): qty 200

## 2017-12-25 MED ORDER — NEPRO/CARBSTEADY PO LIQD
1000.0000 mL | ORAL | Status: DC
  Administered 2017-12-25 – 2017-12-26 (×2): 1000 mL

## 2017-12-25 MED ORDER — DIPHENHYDRAMINE HCL 50 MG/ML IJ SOLN
50.0000 mg | Freq: Once | INTRAMUSCULAR | Status: AC
  Administered 2017-12-25: 50 mg via INTRAVENOUS
  Filled 2017-12-25: qty 1

## 2017-12-25 MED ORDER — PRO-STAT SUGAR FREE PO LIQD
60.0000 mL | Freq: Two times a day (BID) | ORAL | Status: DC
  Administered 2017-12-25 – 2017-12-27 (×5): 60 mL

## 2017-12-25 NOTE — Progress Notes (Signed)
PULMONARY / CRITICAL CARE MEDICINE   Name: Dennis Walker MRN: [id number] DOB: 08-06-1950    ADMISSION DATE:  12/24/2017  HISTORY OF PRESENT ILLNESS:   Dennis Walker a67 y.o.malewith a known history of multiple medical problems including, ESRD on HD, CVA, CAD, PVD among other things. The patient was sent from nursing home due to altered mental status. He was found high fever 103 and altered mental status. He is on home oxygen. He is intubated in the ED. Chest x-ray and abdominal CAT scan show pneumonia.    REVIEW OF SYSTEMS:   On vent- unable to acquire.  SUBJECTIVE:  Patient became hypotensive last evening  VITAL SIGNS: BP 98/61   Pulse 96   Temp (!) 101.1 F (38.4 C)   Resp (!) 21   Ht 5\' 8"  (1.727 m)   Wt 179 lb 14.3 oz (81.6 kg)   SpO2 97%   BMI 27.35 kg/m   HEMODYNAMICS:  no compromise  VENTILATOR SETTINGS: Vent Mode: Spontaneous FiO2 (%):  [40 %] 40 % Set Rate:  [16 bmp-17 bmp] 17 bmp Vt Set:  [500 mL] 500 mL PEEP:  [5 cmH20] 5 cmH20 Pressure Support:  [5 cmH20] 5 cmH20 Plateau Pressure:  [24 cmH20] 24 cmH20  INTAKE / OUTPUT: I/O last 3 completed shifts: In: 1717.9 [I.V.:1020.8; Other:120; NG/GT:30; IV Piggyback:547.1] Out: 330 [Urine:80; Emesis/NG output:250]  PHYSICAL EXAMINATION: GENERAL: comfortableonventilation.  EYES: Right eye injury; left pupils reactive HEENT: Head atraumatic, normocephalic. NECK: Supple, no jugular venous distention. No thyroid enlargement, no tenderness.  LUNGS: Normal breath sounds bilaterally, no wheezing, rales,rhonchi or crepitation. No use of accessory muscles of respiration.  CARDIOVASCULAR: S1, S2 normal. No murmurs, rubs, or gallops.  ABDOMEN: Soft, nontender, nondistended.Hypoactive bowel sounds present. EXTREMITIES: No cyanosis, or clubbing.Right BKA. Left leg trace edema. NEUROLOGIC:Unable to exam. PSYCHIATRIC: The patient isintubated and sedated. SKIN: No obvious rash, hyperpigmentation and  chronic venous stasis left leg   LABS:  BMET Recent Labs  Lab 12/24/17 1053 12/25/17 0407  NA 132* 132*  K 3.7 3.7  CL 96* 97*  CO2 25 23  BUN 27* 36*  CREATININE 5.30* 6.27*  GLUCOSE 113* 111*    Electrolytes Recent Labs  Lab 12/24/17 1053 12/25/17 0407  CALCIUM 8.6* 7.8*  PHOS  --  2.5    CBC Recent Labs  Lab 12/24/17 1053 12/25/17 0407  WBC 11.3* 8.9  HGB 13.4 14.2  HCT 40.7 43.5  PLT 122* 93*    Coag's Recent Labs  Lab 12/24/17 1057  INR 1.17    Sepsis Markers Recent Labs  Lab 12/24/17 1057 12/24/17 1432  LATICACIDVEN 0.9 1.5    ABG Recent Labs  Lab 12/24/17 1212  PHART 7.46*  PCO2ART 36  PO2ART 121*    Liver Enzymes Recent Labs  Lab 12/24/17 1053 12/25/17 0407  AST 27  --   ALT 16*  --   ALKPHOS 67  --   BILITOT 0.9  --   ALBUMIN 3.9 2.9*    Cardiac Enzymes No results for input(s): TROPONINI, PROBNP in the last 168 hours.  Glucose Recent Labs  Lab 12/24/17 2026 12/24/17 2341 12/25/17 0311 12/25/17 0748 12/25/17 1045 12/25/17 1135  GLUCAP 109* 99 100* 74 70 137*    Imaging Dg Chest Port 1 View  Result Date: 12/25/2017 CLINICAL DATA:  Respiratory failure EXAM: PORTABLE CHEST 1 VIEW COMPARISON:  December 24, 2017 FINDINGS: The ETT is in good position. The OG tube terminates below today's film. A vascular stent over the left  upper chest is stable. No pneumothorax. Cardiomegaly persists. Mild opacity in the left mid lung is platelike and probably atelectasis. Recommend attention on follow-up. No overt edema. No other changes. IMPRESSION: 1. The ETT has been repositioned and is in good position. The OG tube terminates below today's film. 2. New platelike opacity in the left mid lung, likely atelectasis. Recommend attention on follow-up. 3. No other change. Electronically Signed   By: Dorise Bullion III M.D   On: 12/25/2017 07:54   Dg Abd Portable 1v  Result Date: 12/25/2017 CLINICAL DATA:  OG tube placement EXAM: PORTABLE  ABDOMEN - 1 VIEW COMPARISON:  August 05, 2017 FINDINGS: The OG tube terminates in the right mid abdomen, likely in the distal stomach. Mild fecal loading in the upper colon, incompletely visualized. No other change. IMPRESSION: 1. The OG tube appears to terminate in the distal stomach. 2. Mild fecal loading. Electronically Signed   By: Dorise Bullion III M.D   On: 12/25/2017 07:53    CULTURES: MRSA PCR negative Blood- MRSA +  ANTIBIOTICS: 6/23 Azactam- D/C 6/23 Vancomycin  SIGNIFICANT EVENTS: 6/23 Admitted to ICU  LINES/TUBES: Peripheral IVs, Foley  DISCUSSION: This 67 year old gentleman with his of ESRD on HD, CVA, DM admitted from NH with pyrexia and found to have MRSA bacteremia.  ASSESSMENT / PLAN: 1. Acute respiratory failure with hypoxia,  due to pneumonia HCAP. Plan: Continue ventilation, continue , lung recruitment  2. Altered Mental status -from sepsis On sedation holiday, patient did not follow commands. Plan: Cont vent management  3. MRSA bacteremia If patient continues to be pyrexial will need evaluation for colonization of shunt.    Plan:  Cont ABX, schedule TTE and consider TEE day 5  4. Sepsis from MRSA bacteremia Plan:  All antihypertensive meds D/Ced  5. Acute metabolic encephalopathy possible due to sepsis. Plan: supportive care  6. ESRD on hemodialysis. Nephrology consult to continue hemodialysis. Plan: management as per Nephrology team  7. Hx of Diabetes.  Plan: Target glucose management. Dietitian consult   I have dedicated a total of 40 minutes in critical care time minus all appropriate exclusions.   FAMILY  - Updates: Daughter updated at bedside    Cammie Sickle, MD Pulmonary and [place] Pager: [phone]  12/25/2017, 12:48 PM

## 2017-12-25 NOTE — Progress Notes (Signed)
Pharmacy Antibiotic Note  Dennis Walker is a 67 y.o. male admitted on 12/24/2017 with pneumonia.  Pharmacy has been consulted for vancomycin dosing.  Plan: Discontinue aztreonam. Vancomycin 750mg  x1 at 0930 on 6/24. Vancomycin 1,000mg  every T-Th-Sa with HD at 1200. Obtain vanc trough 6/29 prior to dialysis.   Height: 5\' 8"  (172.7 cm) Weight: 179 lb 14.3 oz (81.6 kg) IBW/kg (Calculated) : 68.4  Temp (24hrs), Avg:99.1 F (37.3 C), Min:95.2 F (35.1 C), Max:103.4 F (39.7 C)  Recent Labs  Lab 12/24/17 1053 12/24/17 1057 12/24/17 1432 12/25/17 0407  WBC 11.3*  --   --  8.9  CREATININE 5.30*  --   --  6.27*  LATICACIDVEN  --  0.9 1.5  --     Estimated Creatinine Clearance: 11.4 mL/min (A) (by C-G formula based on SCr of 6.27 mg/dL (H)).    Allergies  Allergen Reactions  . Cefuroxime Itching  . Septra Ds  [Sulfamethoxazole-Trimethoprim]     Other reaction(s): Itching  . Shrimp [Shellfish Allergy] Other (See Comments)    Per mar. Unknown reaction   . Sulfa Antibiotics Other (See Comments)    Per mar. Unknown reaction    Antimicrobials this admission: 6/23 aztreonam >> 6/23 6/23 levofloxacin >> 6/23 6/23 vancomycin >> 6/24 Zosyn >> 6/24   Dose adjustments this admission: 6/23 vancomycin 1,000mg  q48h >> 6/24 vancomycin 750mg  x1 dose & vancomycin 1,000mg  every Tuesday, Thursday, Saturday with HD  Microbiology results: 6/23 BCx: MRSA 6/23 Sputum: moderate gram positive cocci in clusters 6/23 MRSA PCR: negative  Thank you for allowing pharmacy to be a part of this patient's care.  Tyson Babinski 12/25/2017 4:18 PM

## 2017-12-25 NOTE — Progress Notes (Signed)
Initial Nutrition Assessment  DOCUMENTATION CODES:   Not applicable  INTERVENTION:  Initiate Nepro at 40 mL/hr (960 mL goal daily volume) + Pro-Stat 60 mL BID per OGT. Provides 2128 kcal, 138 grams of protein, 701 mL H2O daily.  Provide B-complex with C daily per tube.  Provide minimum free water flush of 30 mL Q4hrs to maintain tube patency at this time. Patient is on NS @ 50 mL/hr and making very little UOP (unsure usual UOP at baseline in setting of ESRD on HD).  Recommend checking potassium, magnesium, and phosphorus with AM labs.  NUTRITION DIAGNOSIS:   Inadequate oral intake related to inability to eat as evidenced by NPO status.  GOAL:   Provide needs based on ASPEN/SCCM guidelines  MONITOR:   Vent status, Labs, Weight trends, TF tolerance, Skin, I & O's  REASON FOR ASSESSMENT:   Ventilator Enteral/tube feeding initiation and management  ASSESSMENT:   67 year old male with PMHx of DM type 2, HTN, HLD, hx CVA, PVD, hx right BKA in 2003, CHF, CAD, hx MI, asthma, depression, anxiety, arthritis, polio in childhood, ESRD on HD who was admitted from [facility] with AMS, acute respiratory failure with hypoxia due to PNA requiring mechanical intubation on 6/23.   Patient intubated and sedated. On PS/CPAP mode with FiO2 40% at time of RD assessment. Patient's daughter was at bedside. She reports patient had a good appetite PTA and was eating well. Per chart he was on a carbohydrate-controlled, renal diet with 1.2 L fluid restriction. He has an allergy to shrimp. She reports he is weight-stable. Patient is wheel-chair bound mostly per daughter. Noted a weight of  204.3 lbs on 08/06/2017. Either that weight or current weight may be inaccurate, will continue to monitor.  Access: 18 Fr. OGT placed 6/23; terminates in distal stomach per abdominal x-ray 6/23; 65 cm at corner of mouth  MAP: 64-80 mmHg  Patient is currently intubated on ventilator support Ve: 7 L/min Temp (24hrs),  Avg:99.6 F (37.6 C), Min:95.2 F (35.1 C), Max:103.4 F (39.7 C)  Propofol: N/A; pt has been off propofol since 0213 this AM  Medications reviewed and include: NS @ 50 mLhr, fentanyl gtt, Levophed gtt off since 0247, propofol gtt off.  Labs reviewed: CBG 70-137, Sodium 132, Chloride 97, BUN 36, Creatinine 6.27. Potassium and Phosphorus WNL.  I/O: 80 mL UOP yesterday  Patient does not meet criteria for malnutrition at this time.  Discussed with RN and on rounds.  NUTRITION - FOCUSED PHYSICAL EXAM:    Most Recent Value  Orbital Region  No depletion  Upper Arm Region  No depletion  Thoracic and Lumbar Region  No depletion  Buccal Region  Unable to assess  Temple Region  No depletion  Clavicle Bone Region  No depletion  Clavicle and Acromion Bone Region  No depletion  Scapular Bone Region  Unable to assess  Dorsal Hand  No depletion  Patellar Region  Moderate depletion  Anterior Thigh Region  Moderate depletion  Posterior Calf Region  Severe depletion  Edema (RD Assessment)  None  Hair  Reviewed  Eyes  Unable to assess  Mouth  Unable to assess  Skin  Reviewed  Nails  Reviewed     Diet Order:   Diet Order           Diet NPO time specified  Diet effective now          EDUCATION NEEDS:   No education needs have been identified at this time  Skin:  Skin Assessment: Skin Integrity Issues:(s/p right BKA)  Last BM:  12/25/2017 - medium type 1  Height:   Ht Readings from Last 1 Encounters:  12/24/17 5\' 8"  (1.727 m)    Weight:   Wt Readings from Last 1 Encounters:  12/24/17 179 lb 14.3 oz (81.6 kg)    Ideal Body Weight:  65.5 kg(adjusted for right BKA)  BMI:  Body mass index is 27.35 kg/m.  Estimated Nutritional Needs:   Kcal:  2168 (PSU 2003b w/ MSJ 1580, Ve 7, Tmax 39.8)  Protein:  120-145 grams (1.5-1.8 grams/kg)  Fluid:  UOP + 1 L  Willey Blade, MS, RD, LDN Office: [phone] Pager: [phone] After Hours/Weekend Pager:  [phone]

## 2017-12-25 NOTE — Progress Notes (Signed)
      INFECTIOUS DISEASE ATTENDING ADDENDUM:   Date: 12/25/2017  Patient name: Dennis Walker  Medical record number: [id number]  Date of birth: 1950-10-17        [facility] Antimicrobial Management Team Staphylococcus aureus bacteremia   Staphylococcus aureus bacteremia (SAB) is associated with a high rate of complications and mortality.  Specific aspects of clinical management are critical to optimizing the outcome of patients with SAB.  Therefore, the [facility] Antimicrobial Management Team [facility]) has initiated an intervention aimed at improving the management of SAB at [facility].  To do so, Infectious Diseases physicians are providing an evidence-based consult for the management of all patients with SAB.     Yes No Comments  Perform follow-up blood cultures (even if the patient is afebrile) to ensure clearance of bacteremia [x]  []    Remove vascular catheter and obtain follow-up blood cultures after the removal of the catheter [x]  []  He HAS LINES THAT WILL NEED TO BE REMOVED TO EFFECT CATHETER HOLIDAY  Perform echocardiography to evaluate for endocarditis (transthoracic ECHO is 40-50% sensitive, TEE is > 90% sensitive) [x]  []  Please keep in mind, that neither test can definitively EXCLUDE endocarditis, and that should clinical suspicion remain high for endocarditis the patient should then still be treated with an "endocarditis" duration of therapy = 6 weeks  Consult electrophysiologist to evaluate implanted cardiac device (pacemaker, ICD) []  []  DOes he have Cardiac device?  Ensure source control []  []  Have all abscesses been drained effectively? Have deep seeded infections (septic joints or osteomyelitis) had appropriate surgical debridement? Source unclear. Hx of BKA Needs thorough exam  Investigate for "metastatic" sites of infection [x]  []  Does the patient have ANY symptom or physical exam finding that would suggest a deeper infection (back or neck pain that may be suggestive of  vertebral osteomyelitis or epidural abscess, muscle pain that could be a symptom of pyomyositis)?  Keep in mind that for deep seeded infections MRI imaging with contrast is preferred rather than other often insensitive tests such as plain x-rays, especially early in a patient's presentation.  Change antibiotic therapy to vancomycin []  []  Beta-lactam antibiotics are preferred for MSSA due to higher cure rates.   If on Vancomycin, goal trough should be 15 - 20 mcg/mL  Estimated duration of IV antibiotic therapy:  4-8 weeks []  []  Consult case management for probably prolonged outpatient IV antibiotic therapy   ICU MD paged.   My pager is [place] 12/25/2017, 4:11 PM

## 2017-12-25 NOTE — Consult Note (Signed)
Infectious Disease Remote consultation  Consulting Physician: CHAMP autoconsult  History reviewed.  Presented with AMS, intubated for airway protection.  Now 2/2 sets of blood cultures positive for GPC with BCID positive for MRSA.   Has fistula in place.   ESRD on hemodialysis.  Requiring pressor support.   CXR without opacity CT with some atelectasis  A/P Continue vancomycin I will stop Pip-Tazo Will need evaluation of fistula to assure no infection Will need TTE and TEE if negative Repeat blood cultures tomorrow.    Dr. Tommy Medal to continue to follow remotely  Thayer Headings, MD

## 2017-12-25 NOTE — Progress Notes (Signed)
[facility]  ROUNDING NOTE   Subjective:   Daughter at bedside.  [place] Desanctis [place] admitted to [facility] on 12/24/2017 for Fever, unspecified fever cause [R50.9] Altered mental status, unspecified altered mental status type [R41.82]  Intubated for acute respiratory failure and pneumonia  Last hemodialysis treatment was 6/22 Saturday, tolerated treatment well.   Objective:  Vital signs in last 24 hours:  Temp:  [95.2 F (35.1 C)-103.9 F (39.9 C)] 100.9 F (38.3 C) (06/24 0900) Pulse Rate:  [61-102] 79 (06/24 0600) Resp:  [16-30] 19 (06/24 0900) BP: (61-208)/(43-102) 94/62 (06/24 0800) SpO2:  [91 %-100 %] 95 % (06/24 0828) FiO2 (%):  [40 %] 40 % (06/24 0828) Weight:  [81.6 kg (179 lb 14.3 oz)-81.6 kg (180 lb)] 81.6 kg (179 lb 14.3 oz) (06/23 1402)  Weight change:  Filed Weights   12/24/17 1058 12/24/17 1402  Weight: 81.6 kg (180 lb) 81.6 kg (179 lb 14.3 oz)    Intake/Output: I/O last 3 completed shifts: In: 1717.9 [I.V.:1020.8; Other:120; NG/GT:30; IV Piggyback:547.1] Out: 330 [Urine:80; Emesis/NG output:250]   Intake/Output this shift:  Total I/O In: 217.8 [I.V.:217.8] Out: 8 [Urine:8]  Physical Exam: General: Critically Ill  Head: ETT OGT  Eyes: Anicteric, PERRL  Neck: Supple, trachea midline  Lungs:  PRVC fIO2 50%  Heart: tachycardia  Abdomen:  Soft, nontender, obese  Extremities:  right BKA, trace peripheral edema  Neurologic: Intubated, sedated  Skin: No lesions  Access: Left AVF    Basic Metabolic Panel: Recent Labs  Lab 12/24/17 1053 12/25/17 0407  NA 132* 132*  K 3.7 3.7  CL 96* 97*  CO2 25 23  GLUCOSE 113* 111*  BUN 27* 36*  CREATININE 5.30* 6.27*  CALCIUM 8.6* 7.8*  PHOS  --  2.5    Liver Function Tests: Recent Labs  Lab 12/24/17 1053 12/25/17 0407  AST 27  --   ALT 16*  --   ALKPHOS 67  --   BILITOT 0.9  --   PROT 7.6  --   ALBUMIN 3.9 2.9*   No results for input(s): LIPASE, AMYLASE in the last 168 hours. Recent Labs   Lab 12/24/17 1057  AMMONIA 48*    CBC: Recent Labs  Lab 12/24/17 1053 12/25/17 0407  WBC 11.3* 8.9  NEUTROABS 10.3*  --   HGB 13.4 14.2  HCT 40.7 43.5  MCV 84.6 85.9  PLT 122* 93*    Cardiac Enzymes: No results for input(s): CKTOTAL, CKMB, CKMBINDEX, TROPONINI in the last 168 hours.  BNP: Invalid input(s): POCBNP  CBG: Recent Labs  Lab 12/24/17 1408 12/24/17 2026 12/24/17 2341 12/25/17 0311 12/25/17 0748  GLUCAP 120* 109* 99 100* 74    Microbiology: Results for orders placed or performed during the hospital encounter of 12/24/17  Blood culture (routine x 2)     Status: None (Preliminary result)   Collection Time: 12/24/17 10:54 AM  Result Value Ref Range Status   Specimen Description BLOOD RIGHT FATTY CASTS  Final   Special Requests   Final    BOTTLES DRAWN AEROBIC AND ANAEROBIC Blood Culture adequate volume   Culture  Setup Time   Final    GRAM POSITIVE COCCI IN BOTH AEROBIC AND ANAEROBIC BOTTLES CRITICAL RESULT CALLED TO, READ BACK BY AND VERIFIED WITH: GARRETT COFFEE AT 2120 12/24/17.MSS/PMH Performed at [facility], [address] [place], [place] [address]    Culture [facility] POSITIVE COCCI  Final   Report Status PENDING  Incomplete  Blood culture (routine x 2)  Status: None (Preliminary result)   Collection Time: 12/24/17 10:55 AM  Result Value Ref Range Status   Specimen Description BLOOD RIGHT ARM  Final   Special Requests   Final    BOTTLES DRAWN AEROBIC AND ANAEROBIC Blood Culture adequate volume   Culture  Setup Time   Final    Organism ID to follow GRAM POSITIVE COCCI IN BOTH AEROBIC AND ANAEROBIC BOTTLES CRITICAL RESULT CALLED TO, READ BACK BY AND VERIFIED WITH: GARRETT COFFEY AT 2120 12/24/17. MSS Performed at [facility], [address] [place], [place] [address]    Culture [facility] POSITIVE COCCI  Final   Report Status PENDING  Incomplete  Blood Culture ID Panel (Reflexed)     Status: Abnormal   Collection Time:  12/24/17 10:55 AM  Result Value Ref Range Status   Enterococcus species NOT DETECTED NOT DETECTED Final   Listeria monocytogenes NOT DETECTED NOT DETECTED Final   Staphylococcus species DETECTED (A) NOT DETECTED Final    Comment: CRITICAL RESULT CALLED TO, READ BACK BY AND VERIFIED WITH: GARRETT COFFEY AT 2120 12/24/17. MSS    Staphylococcus aureus DETECTED (A) NOT DETECTED Final    Comment: Methicillin (oxacillin)-resistant Staphylococcus aureus (MRSA). MRSA is predictably resistant to beta-lactam antibiotics (except ceftaroline). Preferred therapy is vancomycin unless clinically contraindicated. Patient requires contact precautions if  hospitalized. CRITICAL RESULT CALLED TO, READ BACK BY AND VERIFIED WITH: GARRETT COFFEY AT 2120 12/24/17. MSS    Methicillin resistance DETECTED (A) NOT DETECTED Final    Comment: CRITICAL RESULT CALLED TO, READ BACK BY AND VERIFIED WITH: GARRETT COFFEY AT 2120 12/24/17. MSS    Streptococcus species NOT DETECTED NOT DETECTED Final   Streptococcus agalactiae NOT DETECTED NOT DETECTED Final   Streptococcus pneumoniae NOT DETECTED NOT DETECTED Final   Streptococcus pyogenes NOT DETECTED NOT DETECTED Final   Acinetobacter baumannii NOT DETECTED NOT DETECTED Final   Enterobacteriaceae species NOT DETECTED NOT DETECTED Final   Enterobacter cloacae complex NOT DETECTED NOT DETECTED Final   Escherichia coli NOT DETECTED NOT DETECTED Final   Klebsiella oxytoca NOT DETECTED NOT DETECTED Final   Klebsiella pneumoniae NOT DETECTED NOT DETECTED Final   Proteus species NOT DETECTED NOT DETECTED Final   Serratia marcescens NOT DETECTED NOT DETECTED Final   Haemophilus influenzae NOT DETECTED NOT DETECTED Final   Neisseria meningitidis NOT DETECTED NOT DETECTED Final   Pseudomonas aeruginosa NOT DETECTED NOT DETECTED Final   Candida albicans NOT DETECTED NOT DETECTED Final   Candida glabrata NOT DETECTED NOT DETECTED Final   Candida krusei NOT DETECTED NOT DETECTED  Final   Candida parapsilosis NOT DETECTED NOT DETECTED Final   Candida tropicalis NOT DETECTED NOT DETECTED Final    Comment: Performed at [facility], [address] [place], [place] [address]  MRSA PCR Screening     Status: None   Collection Time: 12/24/17  2:18 PM  Result Value Ref Range Status   MRSA by PCR NEGATIVE NEGATIVE Final    Comment:        The GeneXpert MRSA Assay (FDA approved for NASAL specimens only), is one component of a comprehensive MRSA colonization surveillance program. It is not intended to diagnose MRSA infection nor to guide or monitor treatment for MRSA infections. Performed at [facility], [address] [place], [place] [address]   Culture, respiratory (NON-Expectorated)     Status: None (Preliminary result)   Collection Time: 12/24/17  3:08 PM  Result Value Ref Range Status   Specimen Description   Final  SPUTUM Performed at [facility], [address] [place], [place] [address]    Special Requests   Final    NONE Performed at [facility], [place], [place] [address]    Gram Stain   Final    RARE WBC PRESENT, PREDOMINANTLY PMN NO SQUAMOUS EPITHELIAL CELLS SEEN MODERATE GRAM POSITIVE COCCI IN CLUSTERS Performed at [facility], [place] [address], [place], [place] [address]    Culture PENDING  Incomplete   Report Status PENDING  Incomplete    Coagulation Studies: Recent Labs    12/24/17 1057  LABPROT 14.8  INR 1.17    Urinalysis: Recent Labs    12/24/17 0630 12/24/17 1052  COLORURINE AMBER* AMBER*  LABSPEC 1.022 1.022  PHURINE 5.0 5.0  GLUCOSEU NEGATIVE NEGATIVE  HGBUR MODERATE* LARGE*  BILIRUBINUR NEGATIVE NEGATIVE  KETONESUR NEGATIVE NEGATIVE  PROTEINUR >=300* >=300*  NITRITE NEGATIVE NEGATIVE  LEUKOCYTESUR NEGATIVE NEGATIVE      Imaging: Ct Abdomen Pelvis Wo Contrast  Result Date: 12/24/2017 CLINICAL DATA:  End-stage renal disease on dialysis.  Unresponsive with fever. EXAM: CT ABDOMEN AND PELVIS WITHOUT CONTRAST TECHNIQUE: Multidetector CT imaging of the abdomen and pelvis was performed following the standard protocol without IV contrast. COMPARISON:  None. FINDINGS: Lower chest: Small right pleural effusion. Opacification over the left base which may be due to atelectasis or pneumonia. Mild cardiomegaly. Calcified plaque over the right coronary artery. Hepatobiliary: Possible minimal cholelithiasis. Liver and biliary tree are normal. Pancreas: Normal. Spleen: Normal. Adrenals/Urinary Tract: Adrenal glands are normal. Kidneys are normal size without hydronephrosis. No focal renal mass. Few tiny calcifications over the corticomedullary junction bilaterally which may be vascular although cannot exclude small stones. Ureters are normal. Foley catheter is present within a decompressed bladder. Stomach/Bowel: Nasogastric tube has tip over the distal stomach. Stomach is otherwise unremarkable. Small bowel is within normal. Appendix is normal. Mild fecal retention throughout the colon which is otherwise unremarkable. Vascular/Lymphatic: Mild calcified plaque over the abdominal aorta and iliac arteries. Calcified plaque over the femoral arteries. No adenopathy. Reproductive: Normal. Other: No free fluid or focal inflammatory change. Small umbilical hernia containing only peritoneal fat. Musculoskeletal: Mild degenerative change of the spine and hips. IMPRESSION: Airspace opacification of the left base which may be due to a pneumonia or atelectasis. Small right pleural effusion. Cardiomegaly and atherosclerotic coronary artery disease. Few tiny bilateral renal calcifications likely vascular, although cannot exclude nephrolithiasis. No obstruction. Possible mild cholelithiasis. Aortic Atherosclerosis (ICD10-I70.0). Electronically Signed   By: Marin Olp M.D.   On: 12/24/2017 12:55   Ct Head Wo Contrast  Result Date: 12/24/2017 CLINICAL DATA:  67 year old  male with history of fever and unresponsiveness. EXAM: CT HEAD WITHOUT CONTRAST TECHNIQUE: Contiguous axial images were obtained from the base of the skull through the vertex without intravenous contrast. COMPARISON:  Head CT 02/02/2017. FINDINGS: Brain: Extensive areas of low attenuation throughout the left cerebellar hemisphere, compatible with areas of encephalomalacia/gliosis from prior infarct. 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 Patchy and confluent areas of decreased attenuation are noted throughout the deep and periventricular white matter of the cerebral hemispheres bilaterally, compatible with chronic microvascular ischemic disease. No evidence of acute infarction, hemorrhage, hydrocephalus, extra-axial collection or mass lesion/mass effect. Vascular: No hyperdense vessel or unexpected calcification. Skull: Normal. Negative for fracture or focal lesion. Sinuses/Orbits: No acute finding. Other: None. IMPRESSION: 1. No acute intracranial abnormalities. 2. Chronic microvascular ischemic changes in the cerebral white matter and sequela of old left cerebellar and brainstem  infarcts, as above. Electronically Signed   By: Vinnie Langton M.D.   On: 12/24/2017 12:47   Dg Chest Port 1 View  Result Date: 12/25/2017 CLINICAL DATA:  Respiratory failure EXAM: PORTABLE CHEST 1 VIEW COMPARISON:  December 24, 2017 FINDINGS: The ETT is in good position. The OG tube terminates below today's film. A vascular stent over the left upper chest is stable. No pneumothorax. Cardiomegaly persists. Mild opacity in the left mid lung is platelike and probably atelectasis. Recommend attention on follow-up. No overt edema. No other changes. IMPRESSION: 1. The ETT has been repositioned and is in good position. The OG tube terminates below today's film. 2. New platelike opacity in the left mid lung, likely atelectasis. Recommend attention on follow-up. 3. No other change. Electronically Signed   By: Dorise Bullion III M.D   On:  12/25/2017 07:54   Dg Chest Portable 1 View  Result Date: 12/24/2017 CLINICAL DATA:  67 year old male with history of altered mental status status post intubation. EXAM: PORTABLE CHEST 1 VIEW COMPARISON:  Chest x-ray 08/25/2017. FINDINGS: Endotracheal tube is low in position, at the level of the carina directed toward the right mainstem bronchus. A nasogastric tube is seen extending into the stomach, however, the tip of the nasogastric tube extends below the lower margin of the image. Lung volumes are normal. No consolidative airspace disease. No pleural effusions. No pneumothorax. No pulmonary nodule or mass noted. Heart size is borderline enlarged. Upper mediastinal contours are within normal limits. Aortic atherosclerosis. Vascular stent projecting over the left subclavian region. IMPRESSION: 1. Support apparatus, as above. Low position of the endotracheal tube which is at the level of the carina directed toward the right mainstem bronchus. This should be withdrawn approximately 5 cm for more optimal placement. 2. Aortic atherosclerosis. These results were called by telephone at the time of interpretation on 12/24/2017 at 11:50 am to Dr. Charlotte Crumb, who verbally acknowledged these results. Electronically Signed   By: Vinnie Langton M.D.   On: 12/24/2017 11:51   Dg Abd Portable 1v  Result Date: 12/25/2017 CLINICAL DATA:  OG tube placement EXAM: PORTABLE ABDOMEN - 1 VIEW COMPARISON:  August 05, 2017 FINDINGS: The OG tube terminates in the right mid abdomen, likely in the distal stomach. Mild fecal loading in the upper colon, incompletely visualized. No other change. IMPRESSION: 1. The OG tube appears to terminate in the distal stomach. 2. Mild fecal loading. Electronically Signed   By: Dorise Bullion III M.D   On: 12/25/2017 07:53     Medications:   . sodium chloride 50 mL/hr at 12/25/17 0600  . fentaNYL infusion INTRAVENOUS Stopped (12/25/17 0901)  . norepinephrine (LEVOPHED) Adult infusion  Stopped (12/25/17 0247)  . propofol (DIPRIVAN) infusion Stopped (12/25/17 0213)  . [START ON 12/26/2017] vancomycin    . vancomycin     . chlorhexidine gluconate (MEDLINE KIT)  15 mL Mouth Rinse BID  . heparin  5,000 Units Subcutaneous Q8H  . mouth rinse  15 mL Mouth Rinse 10 times per day   albuterol, bisacodyl, fentaNYL, hydrALAZINE, ibuprofen, midazolam, midazolam, ondansetron **OR** ondansetron (ZOFRAN) IV, sennosides  Assessment/ Plan:  Mr. NHAT HEARNE is a 67 y.o. black male with end stage renal disease on  Hemodialysis, diabetes mellitus type II, hypertension, peripheral vascular disease, coronary artery disease, hyperlipidemia, congestive heart failure, left BKA admitted on 08/05/2017 for chest pain   TTS [place] left AVF EDW 90.5kg Resident of [facility]  1. End Stage Renal Disease: on hemodialysis.  TTS schedule. Last hemodialysis was Saturday as outpatient. No acute indication for dialysis today.  - Continue TTS schedule. Dialysis for tomorrow.   2. Hypotension with sepsis: secondary to pneumonia. Requiring vasopressors.  - Holding bumetanide and lisinopril.   3. Anemia of chronic kidney disease: hemoglobin 14.2 - EPO as outpatient.   4. Secondary Hyperparathyroidism:  - Sevelamer with meals when tolerating PO   5. Diabetes mellitus type II with chronic kidney disease: insulin dependent.    LOS: 1 Bralee Feldt 6/24/201910:16 AM

## 2017-12-25 NOTE — Plan of Care (Signed)
Pt. Febrile at beginning of shift: 103.3, cooling blanket placed with great effect. Currently cooling blanket off, T normal. Levo started r/t BP's 60's/40's, titrated off when propofol was stopped (see MAR for details) Pt. On fentanyl gtt with versed pushes, agitated intermittently, attempting to scratch itch on eyes and face.  Daughter bedside states she is itchy when she has been given fentanyl, and he has had similar reactions to opioids in the past.  Benadryl PRN dose, very effective- pt. Resting. Ventilator settings remain the same. Pt. Oliguric-anuric. No care concerns at this time. Report given to [facility].

## 2017-12-25 NOTE — Progress Notes (Signed)
[facility] Progress Note Patient Name: Dennis Walker DOB: August 27, 1950 MRN: [id number]   Date of Service  12/25/2017  HPI/Events of Note  Patient claims to need Benadryl with ordered Fentanyl IV infusion d/t allergy which is not documented in Epic.   eICU Interventions  Will order: 1. Benadryl 12.5 mg IV Q 6 hours PRN itching or allergic reaction.     Intervention Category Major Interventions: Other:  Lysle Dingwall 12/25/2017, 9:41 PM

## 2017-12-26 ENCOUNTER — Inpatient Hospital Stay: Admit: 2017-12-26 | Payer: Medicare Other

## 2017-12-26 ENCOUNTER — Inpatient Hospital Stay
Admit: 2017-12-26 | Discharge: 2017-12-26 | Disposition: A | Discharge status: Home / Self Care | Payer: Medicare Other | Attending: Internal Medicine | Admitting: Internal Medicine

## 2017-12-26 DIAGNOSIS — J15212 Pneumonia due to Methicillin resistant Staphylococcus aureus: Secondary | ICD-10-CM

## 2017-12-26 DIAGNOSIS — J9621 Acute and chronic respiratory failure with hypoxia: Secondary | ICD-10-CM

## 2017-12-26 DIAGNOSIS — A4102 Sepsis due to Methicillin resistant Staphylococcus aureus: Principal | ICD-10-CM

## 2017-12-26 LAB — RENAL FUNCTION PANEL
ALBUMIN: 2.6 g/dL — AB (ref 3.5–5.0)
ANION GAP: 10 (ref 5–15)
BUN: 60 mg/dL — ABNORMAL HIGH (ref 8–23)
CO2: 22 mmol/L (ref 22–32)
Calcium: 7.8 mg/dL — ABNORMAL LOW (ref 8.9–10.3)
Chloride: 97 mmol/L — ABNORMAL LOW (ref 98–111)
Creatinine, Ser: 7.99 mg/dL — ABNORMAL HIGH (ref 0.61–1.24)
GFR calc Af Amer: 7 mL/min — ABNORMAL LOW (ref >60)
GFR calc non Af Amer: 6 mL/min — ABNORMAL LOW (ref >60)
GLUCOSE: 127 mg/dL — AB (ref 70–99)
PHOSPHORUS: 3.6 mg/dL (ref 2.5–4.6)
POTASSIUM: 4.2 mmol/L (ref 3.5–5.1)
Sodium: 129 mmol/L — ABNORMAL LOW (ref 135–145)

## 2017-12-26 LAB — BLOOD GAS, ARTERIAL
Acid-base deficit: 3 mmol/L — ABNORMAL HIGH (ref 0.0–2.0)
BICARBONATE: 23.2 mmol/L (ref 20.0–28.0)
FIO2: 0.4
MECHANICAL RATE: 16
MECHVT: 500 mL
O2 Saturation: 98.9 %
PEEP: 5 cmH2O
PH ART: 7.32 — AB (ref 7.350–7.450)
Patient temperature: 37
pCO2 arterial: 45 mmHg (ref 32.0–48.0)
pO2, Arterial: 135 mmHg — ABNORMAL HIGH (ref 83.0–108.0)

## 2017-12-26 LAB — GLUCOSE, CAPILLARY
GLUCOSE-CAPILLARY: 107 mg/dL — AB (ref 70–99)
GLUCOSE-CAPILLARY: 122 mg/dL — AB (ref 70–99)
GLUCOSE-CAPILLARY: 125 mg/dL — AB (ref 70–99)
GLUCOSE-CAPILLARY: 146 mg/dL — AB (ref 70–99)
GLUCOSE-CAPILLARY: 153 mg/dL — AB (ref 70–99)
Glucose-Capillary: 145 mg/dL — ABNORMAL HIGH (ref 70–99)
Glucose-Capillary: 166 mg/dL — ABNORMAL HIGH (ref 70–99)

## 2017-12-26 LAB — CBC
HEMATOCRIT: 36.1 % — AB (ref 40.0–52.0)
HEMOGLOBIN: 11.9 g/dL — AB (ref 13.0–18.0)
MCH: 27.6 pg (ref 26.0–34.0)
MCHC: 32.8 g/dL (ref 32.0–36.0)
MCV: 84.2 fL (ref 80.0–100.0)
Platelets: 89 10*3/uL — ABNORMAL LOW (ref 150–440)
RBC: 4.29 MIL/uL — ABNORMAL LOW (ref 4.40–5.90)
RDW: 19.3 % — ABNORMAL HIGH (ref 11.5–14.5)
WBC: 12.3 10*3/uL — AB (ref 3.8–10.6)

## 2017-12-26 LAB — MAGNESIUM: MAGNESIUM: 2 mg/dL (ref 1.7–2.4)

## 2017-12-26 LAB — HIV ANTIBODY (ROUTINE TESTING W REFLEX): HIV Screen 4th Generation wRfx: NONREACTIVE

## 2017-12-26 MED ORDER — ACETAMINOPHEN 325 MG PO TABS
650.0000 mg | ORAL_TABLET | Freq: Four times a day (QID) | ORAL | Status: DC | PRN
  Administered 2017-12-26: 650 mg via ORAL
  Filled 2017-12-26: qty 2

## 2017-12-26 MED ORDER — FAMOTIDINE IN NACL 20-0.9 MG/50ML-% IV SOLN
20.0000 mg | INTRAVENOUS | Status: DC
  Administered 2017-12-26: 20 mg via INTRAVENOUS
  Filled 2017-12-26: qty 50

## 2017-12-26 NOTE — Progress Notes (Signed)
[facility]  ROUNDING NOTE   Subjective:   No family at bedside.   Tmax 101.5  NS infusion overnight  Objective:  Vital signs in last 24 hours:  Temp:  [95.5 F (35.3 C)-101.5 F (38.6 C)] 97.7 F (36.5 C) (06/25 0600) Pulse Rate:  [75-98] 91 (06/25 0600) Resp:  [8-24] 21 (06/25 0600) BP: (80-111)/(54-75) 91/57 (06/25 0600) SpO2:  [90 %-100 %] 96 % (06/25 0600) FiO2 (%):  [40 %] 40 % (06/25 0933) Weight:  [92.4 kg (203 lb 11.3 oz)] 92.4 kg (203 lb 11.3 oz) (06/25 0415)  Weight change: 10.8 kg (23 lb 11.3 oz) Filed Weights   12/24/17 1058 12/24/17 1402 12/26/17 0415  Weight: 81.6 kg (180 lb) 81.6 kg (179 lb 14.3 oz) 92.4 kg (203 lb 11.3 oz)    Intake/Output: I/O last 3 completed shifts: In: 3134.6 [I.V.:2369.5; Other:120; NG/GT:594.7; IV Piggyback:50.4] Out: 388 [Urine:38; Emesis/NG output:350]   Intake/Output this shift:  No intake/output data recorded.  Physical Exam: General: Critically Ill  Head: ETT OGT  Eyes: Anicteric, PERRL  Neck: Supple, trachea midline  Lungs:  PRVC fIO2 50%  Heart: tachycardia  Abdomen:  Soft, nontender, obese  Extremities:  right BKA, trace peripheral edema  Neurologic: Intubated, sedated  Skin: No lesions  Access: Left AVF    Basic Metabolic Panel: Recent Labs  Lab 12/24/17 1053 12/25/17 0407 12/26/17 0602 12/26/17 0838  NA 132* 132*  --  129*  K 3.7 3.7  --  4.2  CL 96* 97*  --  97*  CO2 25 23  --  22  GLUCOSE 113* 111*  --  127*  BUN 27* 36*  --  60*  CREATININE 5.30* 6.27*  --  7.99*  CALCIUM 8.6* 7.8*  --  7.8*  MG  --   --  2.0  --   PHOS  --  2.5  --  3.6    Liver Function Tests: Recent Labs  Lab 12/24/17 1053 12/25/17 0407 12/26/17 0838  AST 27  --   --   ALT 16*  --   --   ALKPHOS 67  --   --   BILITOT 0.9  --   --   PROT 7.6  --   --   ALBUMIN 3.9 2.9* 2.6*   No results for input(s): LIPASE, AMYLASE in the last 168 hours. Recent Labs  Lab 12/24/17 1057  AMMONIA 48*     CBC: Recent Labs  Lab 12/24/17 1053 12/25/17 0407 12/26/17 0838  WBC 11.3* 8.9 12.3*  NEUTROABS 10.3*  --   --   HGB 13.4 14.2 11.9*  HCT 40.7 43.5 36.1*  MCV 84.6 85.9 84.2  PLT 122* 93* 89*    Cardiac Enzymes: No results for input(s): CKTOTAL, CKMB, CKMBINDEX, TROPONINI in the last 168 hours.  BNP: Invalid input(s): POCBNP  CBG: Recent Labs  Lab 12/25/17 1626 12/25/17 1941 12/26/17 0030 12/26/17 0402 12/26/17 0728  GLUCAP 76 90 107* 122* 125*    Microbiology: Results for orders placed or performed during the hospital encounter of 12/24/17  Blood culture (routine x 2)     Status: None (Preliminary result)   Collection Time: 12/24/17 10:54 AM  Result Value Ref Range Status   Specimen Description BLOOD RIGHT FATTY CASTS  Final   Special Requests   Final    BOTTLES DRAWN AEROBIC AND ANAEROBIC Blood Culture adequate volume   Culture  Setup Time   Final    GRAM POSITIVE COCCI IN BOTH AEROBIC AND  ANAEROBIC BOTTLES CRITICAL RESULT CALLED TO, READ BACK BY AND VERIFIED WITH: GARRETT COFFEE AT 2120 12/24/17.MSS/PMH Performed at [facility], [address] [place], [place] [address]    Culture [facility] POSITIVE COCCI  Final   Report Status PENDING  Incomplete  Blood culture (routine x 2)     Status: Abnormal (Preliminary result)   Collection Time: 12/24/17 10:55 AM  Result Value Ref Range Status   Specimen Description   Final    BLOOD RIGHT ARM Performed at [facility], [address] [place], [place] [address]    Special Requests   Final    BOTTLES DRAWN AEROBIC AND ANAEROBIC Blood Culture adequate volume Performed at [facility], [address] [place], [place] [address]    Culture  Setup Time   Final    Organism ID to follow GRAM POSITIVE COCCI IN BOTH AEROBIC AND ANAEROBIC BOTTLES CRITICAL RESULT CALLED TO, READ BACK BY AND VERIFIED WITH: GARRETT COFFEY AT 2120 12/24/17. MSS Performed at [facility], [address] [place], [place] [address]    Culture STAPHYLOCOCCUS AUREUS (A)  Final   Report Status PENDING  Incomplete  Blood Culture ID Panel (Reflexed)     Status: Abnormal   Collection Time: 12/24/17 10:55 AM  Result Value Ref Range Status   Enterococcus species NOT DETECTED NOT DETECTED Final   Listeria monocytogenes NOT DETECTED NOT DETECTED Final   Staphylococcus species DETECTED (A) NOT DETECTED Final    Comment: CRITICAL RESULT CALLED TO, READ BACK BY AND VERIFIED WITH: GARRETT COFFEY AT 2120 12/24/17. MSS    Staphylococcus aureus DETECTED (A) NOT DETECTED Final    Comment: Methicillin (oxacillin)-resistant Staphylococcus aureus (MRSA). MRSA is predictably resistant to beta-lactam antibiotics (except ceftaroline). Preferred therapy is vancomycin unless clinically contraindicated. Patient requires contact precautions if  hospitalized. CRITICAL RESULT CALLED TO, READ BACK BY AND VERIFIED WITH: GARRETT COFFEY AT 2120 12/24/17. MSS    Methicillin resistance DETECTED (A) NOT DETECTED Final    Comment: CRITICAL RESULT CALLED TO, READ BACK BY AND VERIFIED WITH: GARRETT COFFEY AT 2120 12/24/17. MSS    Streptococcus species NOT DETECTED NOT DETECTED Final   Streptococcus agalactiae NOT DETECTED NOT DETECTED Final   Streptococcus pneumoniae NOT DETECTED NOT DETECTED Final   Streptococcus pyogenes NOT DETECTED NOT DETECTED Final   Acinetobacter baumannii NOT DETECTED NOT DETECTED Final   Enterobacteriaceae species NOT DETECTED NOT DETECTED Final   Enterobacter cloacae complex NOT DETECTED NOT DETECTED Final   Escherichia coli NOT DETECTED NOT DETECTED Final   Klebsiella oxytoca NOT DETECTED NOT DETECTED Final   Klebsiella pneumoniae NOT DETECTED NOT DETECTED Final   Proteus species NOT DETECTED NOT DETECTED Final   Serratia marcescens NOT DETECTED NOT DETECTED Final   Haemophilus influenzae NOT DETECTED NOT DETECTED Final   Neisseria meningitidis NOT DETECTED NOT DETECTED Final   Pseudomonas  aeruginosa NOT DETECTED NOT DETECTED Final   Candida albicans NOT DETECTED NOT DETECTED Final   Candida glabrata NOT DETECTED NOT DETECTED Final   Candida krusei NOT DETECTED NOT DETECTED Final   Candida parapsilosis NOT DETECTED NOT DETECTED Final   Candida tropicalis NOT DETECTED NOT DETECTED Final    Comment: Performed at [facility], [address] [place], [place] [address]  MRSA PCR Screening     Status: None   Collection Time: 12/24/17  2:18 PM  Result Value Ref Range Status   MRSA by PCR NEGATIVE NEGATIVE Final    Comment:  The GeneXpert MRSA Assay (FDA approved for NASAL specimens only), is one component of a comprehensive MRSA colonization surveillance program. It is not intended to diagnose MRSA infection nor to guide or monitor treatment for MRSA infections. Performed at [facility], [address] [place], [place] [address]   Culture, respiratory (NON-Expectorated)     Status: None (Preliminary result)   Collection Time: 12/24/17  3:08 PM  Result Value Ref Range Status   Specimen Description   Final    SPUTUM Performed at [facility], [address], [place], [place] [address]    Special Requests   Final    NONE Performed at [facility], [address] [place], [place] [address]    Gram Stain   Final    RARE WBC PRESENT, PREDOMINANTLY PMN NO SQUAMOUS EPITHELIAL CELLS SEEN MODERATE GRAM POSITIVE COCCI IN CLUSTERS    Culture   Final    TOO YOUNG TO READ Performed at [facility], [place] [address], [place], [place] [address]    Report Status PENDING  Incomplete  Culture, blood (Routine X 2) w Reflex to ID Panel     Status: None (Preliminary result)   Collection Time: 12/25/17 10:03 AM  Result Value Ref Range Status   Specimen Description BLOOD BLOOD RIGHT HAND  Final   Special Requests   Final    BOTTLES DRAWN AEROBIC AND ANAEROBIC Blood Culture adequate volume   Culture   Final    NO GROWTH < 24  HOURS Performed at [facility], [address] [place], [place] [address]    Report Status PENDING  Incomplete  Culture, blood (Routine X 2) w Reflex to ID Panel     Status: None (Preliminary result)   Collection Time: 12/25/17 10:25 AM  Result Value Ref Range Status   Specimen Description BLOOD BLOOD RIGHT HAND  Final   Special Requests   Final    BOTTLES DRAWN AEROBIC AND ANAEROBIC Blood Culture adequate volume   Culture   Final    NO GROWTH < 24 HOURS Performed at [facility], [address] [place], [place] [address]    Report Status PENDING  Incomplete    Coagulation Studies: Recent Labs    12/24/17 1057  LABPROT 14.8  INR 1.17    Urinalysis: Recent Labs    12/24/17 0630 12/24/17 1052  COLORURINE AMBER* AMBER*  LABSPEC 1.022 1.022  PHURINE 5.0 5.0  GLUCOSEU NEGATIVE NEGATIVE  HGBUR MODERATE* LARGE*  BILIRUBINUR NEGATIVE NEGATIVE  KETONESUR NEGATIVE NEGATIVE  PROTEINUR >=300* >=300*  NITRITE NEGATIVE NEGATIVE  LEUKOCYTESUR NEGATIVE NEGATIVE      Imaging: Ct Abdomen Pelvis Wo Contrast  Result Date: 12/24/2017 CLINICAL DATA:  End-stage renal disease on dialysis. Unresponsive with fever. EXAM: CT ABDOMEN AND PELVIS WITHOUT CONTRAST TECHNIQUE: Multidetector CT imaging of the abdomen and pelvis was performed following the standard protocol without IV contrast. COMPARISON:  None. FINDINGS: Lower chest: Small right pleural effusion. Opacification over the left base which may be due to atelectasis or pneumonia. Mild cardiomegaly. Calcified plaque over the right coronary artery. Hepatobiliary: Possible minimal cholelithiasis. Liver and biliary tree are normal. Pancreas: Normal. Spleen: Normal. Adrenals/Urinary Tract: Adrenal glands are normal. Kidneys are normal size without hydronephrosis. No focal renal mass. Few tiny calcifications over the corticomedullary junction bilaterally which may be vascular although cannot exclude small stones. Ureters are  normal. Foley catheter is present within a decompressed bladder. Stomach/Bowel: Nasogastric tube has tip over the distal stomach. Stomach is  otherwise unremarkable. Small bowel is within normal. Appendix is normal. Mild fecal retention throughout the colon which is otherwise unremarkable. Vascular/Lymphatic: Mild calcified plaque over the abdominal aorta and iliac arteries. Calcified plaque over the femoral arteries. No adenopathy. Reproductive: Normal. Other: No free fluid or focal inflammatory change. Small umbilical hernia containing only peritoneal fat. Musculoskeletal: Mild degenerative change of the spine and hips. IMPRESSION: Airspace opacification of the left base which may be due to a pneumonia or atelectasis. Small right pleural effusion. Cardiomegaly and atherosclerotic coronary artery disease. Few tiny bilateral renal calcifications likely vascular, although cannot exclude nephrolithiasis. No obstruction. Possible mild cholelithiasis. Aortic Atherosclerosis (ICD10-I70.0). Electronically Signed   By: Marin Olp M.D.   On: 12/24/2017 12:55   Ct Head Wo Contrast  Result Date: 12/24/2017 CLINICAL DATA:  67 year old male with history of fever and unresponsiveness. EXAM: CT HEAD WITHOUT CONTRAST TECHNIQUE: Contiguous axial images were obtained from the base of the skull through the vertex without intravenous contrast. COMPARISON:  Head CT 02/02/2017. FINDINGS: Brain: Extensive areas of low attenuation throughout the left cerebellar hemisphere, compatible with areas of encephalomalacia/gliosis from prior infarct. 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 Patchy and confluent areas of decreased attenuation are noted throughout the deep and periventricular white matter of the cerebral hemispheres bilaterally, compatible with chronic microvascular ischemic disease. No evidence of acute infarction, hemorrhage, hydrocephalus, extra-axial collection or mass lesion/mass effect. Vascular: No hyperdense vessel or  unexpected calcification. Skull: Normal. Negative for fracture or focal lesion. Sinuses/Orbits: No acute finding. Other: None. IMPRESSION: 1. No acute intracranial abnormalities. 2. 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 Electronically Signed   By: Vinnie Langton M.D.   On: 12/24/2017 12:47   Dg Chest Port 1 View  Result Date: 12/25/2017 CLINICAL DATA:  Respiratory failure EXAM: PORTABLE CHEST 1 VIEW COMPARISON:  December 24, 2017 FINDINGS: The ETT is in good position. The OG tube terminates below today's film. A vascular stent over the left upper chest is stable. No pneumothorax. Cardiomegaly persists. Mild opacity in the left mid lung is platelike and probably atelectasis. Recommend attention on follow-up. No overt edema. No other changes. IMPRESSION: 1. The ETT has been repositioned and is in good position. The OG tube terminates below today's film. 2. New platelike opacity in the left mid lung, likely atelectasis. Recommend attention on follow-up. 3. No other change. Electronically Signed   By: Dorise Bullion III M.D   On: 12/25/2017 07:54   Dg Chest Portable 1 View  Result Date: 12/24/2017 CLINICAL DATA:  67 year old male with history of altered mental status status post intubation. EXAM: PORTABLE CHEST 1 VIEW COMPARISON:  Chest x-ray 08/25/2017. FINDINGS: Endotracheal tube is low in position, at the level of the carina directed toward the right mainstem bronchus. A nasogastric tube is seen extending into the stomach, however, the tip of the nasogastric tube extends below the lower margin of the image. Lung volumes are normal. No consolidative airspace disease. No pleural effusions. No pneumothorax. No pulmonary nodule or mass noted. Heart size is borderline enlarged. Upper mediastinal contours are within normal limits. Aortic atherosclerosis. Vascular stent projecting over the left subclavian region.  IMPRESSION: 1. Support apparatus, as above. Low position of the endotracheal tube which is at the level of the carina directed toward the right mainstem bronchus. This should be withdrawn approximately 5 cm for more optimal placement. 2. Aortic atherosclerosis. These results were called by telephone at the time  of interpretation on 12/24/2017 at 11:50 am to Dr. Charlotte Crumb, who verbally acknowledged these results. Electronically Signed   By: Vinnie Langton M.D.   On: 12/24/2017 11:51   Dg Abd Portable 1v  Result Date: 12/25/2017 CLINICAL DATA:  OG tube placement EXAM: PORTABLE ABDOMEN - 1 VIEW COMPARISON:  August 05, 2017 FINDINGS: The OG tube terminates in the right mid abdomen, likely in the distal stomach. Mild fecal loading in the upper colon, incompletely visualized. No other change. IMPRESSION: 1. The OG tube appears to terminate in the distal stomach. 2. Mild fecal loading. Electronically Signed   By: Dorise Bullion III M.D   On: 12/25/2017 07:53     Medications:   . fentaNYL infusion INTRAVENOUS 200 mcg/hr (12/26/17 0600)  . norepinephrine (LEVOPHED) Adult infusion Stopped (12/25/17 0247)  . propofol (DIPRIVAN) infusion Stopped (12/25/17 0213)  . vancomycin     . B-complex with vitamin C  1 tablet Per Tube QHS  . chlorhexidine gluconate (MEDLINE KIT)  15 mL Mouth Rinse BID  . feeding supplement (NEPRO CARB STEADY)  1,000 mL Per Tube Q24H  . feeding supplement (PRO-STAT SUGAR FREE 64)  60 mL Per Tube BID  . heparin  5,000 Units Subcutaneous Q8H  . mouth rinse  15 mL Mouth Rinse 10 times per day   albuterol, bisacodyl, diphenhydrAMINE, fentaNYL, ibuprofen, midazolam, ondansetron **OR** ondansetron (ZOFRAN) IV, sennosides  Assessment/ Plan:  [name] is a 67 y.o. black male with end stage renal disease on Hemodialysis, diabetes mellitus type II, hypertension, peripheral vascular disease, coronary artery disease, hyperlipidemia, congestive heart failure, left BKA admitted on  08/05/2017 for chest pain. Currently intubated with acute respiratory failure  TTS [facility] left AVF EDW 90.5kg Resident of [facility]  1. End Stage Renal Disease: on hemodialysis. TTS schedule. Last hemodialysis was Saturday as outpatient. Hemodialysis for later today. Orders prepared. Continue TTS schedule.  2. Hypotension with sepsis: secondary to pneumonia. Blood cultures from 6/23 growing MRSA. Required vasopressors.  - Holding bumetanide and lisinopril. - IV vancomycin   3. Anemia of chronic kidney disease: hemoglobin 11.9 - EPO as outpatient.   4. Secondary Hyperparathyroidism:  - Sevelamer with meals when tolerating PO   5. Diabetes mellitus type II with chronic kidney disease: insulin dependent.    LOS: 2 Etoy Mcdonnell 6/25/201910:14 AM

## 2017-12-26 NOTE — Progress Notes (Signed)
[name] NAME: Dennis Walker    MR#:  [id number]  DATE OF BIRTH:  [birth year]  SUBJECTIVE:  CHIEF COMPLAINT:   Chief Complaint  Patient presents with  . Altered Mental Status   Came with AMS, Septic, intubated.  REVIEW OF SYSTEMS:   Intubated, can not give ROS.  ROS  DRUG ALLERGIES:   Allergies  Allergen Reactions  . Cefuroxime Itching  . Septra Ds  [Sulfamethoxazole-Trimethoprim]     Other reaction(s): Itching  . Shrimp [Shellfish Allergy] Other (See Comments)    Per mar. Unknown reaction   . Sulfa Antibiotics Other (See Comments)    Per mar. Unknown reaction    VITALS:  Blood pressure (!) 91/57, pulse 91, temperature 97.7 F (36.5 C), resp. rate (!) 21, height 5\' 8"  (1.727 m), weight 92.4 kg (203 lb 11.3 oz), SpO2 96 %.  PHYSICAL EXAMINATION:  GENERAL:  67 y.o.-year-old patient lying in the bed with no acute distress.  EYES: Pupils equal, round, reactive to light and accommodation. No scleral icterus. Extraocular muscles intact.  HEENT: Head atraumatic, normocephalic. Oropharynx and nasopharynx clear. ETT tube in place. NECK:  Supple, no jugular venous distention. No thyroid enlargement, no tenderness.  LUNGS: Normal breath sounds bilaterally, no wheezing, rales,rhonchi or crepitation. No use of accessory muscles of respiration.  CARDIOVASCULAR: S1, S2 normal. No murmurs, rubs, or gallops.  ABDOMEN: Soft, nontender, nondistended. Bowel sounds present. No organomegaly or mass.  EXTREMITIES: No pedal edema, cyanosis, or clubbing.  NEUROLOGIC: sedated. PSYCHIATRIC: The patient is intubated.  SKIN: No obvious rash, lesion, or ulcer.   Physical Exam LABORATORY PANEL:   CBC Recent Labs  Lab 12/25/17 0407  WBC 8.9  HGB 14.2  HCT 43.5  PLT 93*   ------------------------------------------------------------------------------------------------------------------  Chemistries  Recent Labs  Lab 12/24/17 1053  12/25/17 0407 12/26/17 0602  NA 132* 132*  --   K 3.7 3.7  --   CL 96* 97*  --   CO2 25 23  --   GLUCOSE 113* 111*  --   BUN 27* 36*  --   CREATININE 5.30* 6.27*  --   CALCIUM 8.6* 7.8*  --   MG  --   --  2.0  AST 27  --   --   ALT 16*  --   --   ALKPHOS 67  --   --   BILITOT 0.9  --   --    ------------------------------------------------------------------------------------------------------------------  Cardiac Enzymes No results for input(s): TROPONINI in the last 168 hours. ------------------------------------------------------------------------------------------------------------------  RADIOLOGY:  Ct Abdomen Pelvis Wo Contrast  Result Date: 12/24/2017 CLINICAL DATA:  End-stage renal disease on dialysis. Unresponsive with fever. EXAM: CT ABDOMEN AND PELVIS WITHOUT CONTRAST TECHNIQUE: Multidetector CT imaging of the abdomen and pelvis was performed following the standard protocol without IV contrast. COMPARISON:  None. FINDINGS: Lower chest: Small right pleural effusion. Opacification over the left base which may be due to atelectasis or pneumonia. Mild cardiomegaly. Calcified plaque over the right coronary artery. Hepatobiliary: Possible minimal cholelithiasis. Liver and biliary tree are normal. Pancreas: Normal. Spleen: Normal. Adrenals/Urinary Tract: Adrenal glands are normal. Kidneys are normal size without hydronephrosis. No focal renal mass. Few tiny calcifications over the corticomedullary junction bilaterally which may be vascular although cannot exclude small stones. Ureters are normal. Foley catheter is present within a decompressed bladder. Stomach/Bowel: Nasogastric tube has tip over the distal stomach. Stomach is otherwise unremarkable. Small bowel is within normal. Appendix is normal. Mild fecal  retention throughout the colon which is otherwise unremarkable. Vascular/Lymphatic: Mild calcified plaque over the abdominal aorta and iliac arteries. Calcified plaque over the  femoral arteries. No adenopathy. Reproductive: Normal. Other: No free fluid or focal inflammatory change. Small umbilical hernia containing only peritoneal fat. Musculoskeletal: Mild degenerative change of the spine and hips. IMPRESSION: Airspace opacification of the left base which may be due to a pneumonia or atelectasis. Small right pleural effusion. Cardiomegaly and atherosclerotic coronary artery disease. Few tiny bilateral renal calcifications likely vascular, although cannot exclude nephrolithiasis. No obstruction. Possible mild cholelithiasis. Aortic Atherosclerosis (ICD10-I70.0). Electronically Signed   By: Marin Olp M.D.   On: 12/24/2017 12:55   Ct Head Wo Contrast  Result Date: 12/24/2017 CLINICAL DATA:  67 year old male with history of fever and unresponsiveness. EXAM: CT HEAD WITHOUT CONTRAST TECHNIQUE: Contiguous axial images were obtained from the base of the skull through the vertex without intravenous contrast. COMPARISON:  Head CT 02/02/2017. FINDINGS: Brain: Extensive areas of low attenuation throughout the left cerebellar hemisphere, compatible with areas of encephalomalacia/gliosis from prior infarct. 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 Patchy and confluent areas of decreased attenuation are noted throughout the deep and periventricular white matter of the cerebral hemispheres bilaterally, compatible with chronic microvascular ischemic disease. No evidence of acute infarction, hemorrhage, hydrocephalus, extra-axial collection or mass lesion/mass effect. Vascular: No hyperdense vessel or unexpected calcification. Skull: Normal. Negative for fracture or focal lesion. Sinuses/Orbits: No acute finding. Other: None. IMPRESSION: 1. No acute intracranial abnormalities. 2. 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 Electronically Signed   By: Vinnie Langton M.D.   On: 12/24/2017 12:47   Dg Chest  Port 1 View  Result Date: 12/25/2017 CLINICAL DATA:  Respiratory failure EXAM: PORTABLE CHEST 1 VIEW COMPARISON:  December 24, 2017 FINDINGS: The ETT is in good position. The OG tube terminates below today's film. A vascular stent over the left upper chest is stable. No pneumothorax. Cardiomegaly persists. Mild opacity in the left mid lung is platelike and probably atelectasis. Recommend attention on follow-up. No overt edema. No other changes. IMPRESSION: 1. The ETT has been repositioned and is in good position. The OG tube terminates below today's film. 2. New platelike opacity in the left mid lung, likely atelectasis. Recommend attention on follow-up. 3. No other change. Electronically Signed   By: Dorise Bullion III M.D   On: 12/25/2017 07:54   Dg Chest Portable 1 View  Result Date: 12/24/2017 CLINICAL DATA:  67 year old male with history of altered mental status status post intubation. EXAM: PORTABLE CHEST 1 VIEW COMPARISON:  Chest x-ray 08/25/2017. FINDINGS: Endotracheal tube is low in position, at the level of the carina directed toward the right mainstem bronchus. A nasogastric tube is seen extending into the stomach, however, the tip of the nasogastric tube extends below the lower margin of the image. Lung volumes are normal. No consolidative airspace disease. No pleural effusions. No pneumothorax. No pulmonary nodule or mass noted. Heart size is borderline enlarged. Upper mediastinal contours are within normal limits. Aortic atherosclerosis. Vascular stent projecting over the left subclavian region. IMPRESSION: 1. Support apparatus, as above. Low position of the endotracheal tube which is at the level of the carina directed toward the right mainstem bronchus. This should be withdrawn approximately 5 cm for more optimal placement. 2. Aortic atherosclerosis. These results were called by telephone at the time of interpretation on 12/24/2017 at 11:50 am to Dr. Charlotte Crumb, who  verbally acknowledged these  results. Electronically Signed   By: Vinnie Langton M.D.   On: 12/24/2017 11:51   Dg Abd Portable 1v  Result Date: 12/25/2017 CLINICAL DATA:  OG tube placement EXAM: PORTABLE ABDOMEN - 1 VIEW COMPARISON:  August 05, 2017 FINDINGS: The OG tube terminates in the right mid abdomen, likely in the distal stomach. Mild fecal loading in the upper colon, incompletely visualized. No other change. IMPRESSION: 1. The OG tube appears to terminate in the distal stomach. 2. Mild fecal loading. Electronically Signed   By: Dorise Bullion III M.D   On: 12/25/2017 07:53    ASSESSMENT AND PLAN:   Active Problems:   Acute on chronic respiratory failure with hypoxia ([place])   1.Acute respiratory failure with hypoxia, due to pneumonia HCAP. Continue ventilation, continue , lung recruitment  2. Altered Mental status -from sepsis On sedation holiday, patient did not follow commands.  Cont vent management  3. MRSA bacteremia If patient continues to be pyrexial will need evaluation for colonization of shunt.    Cont ABX, schedule TTE and consider TEE day 5  4. Sepsis from MRSA bacteremia  All antihypertensive meds D/Ced  5.Acute metabolic encephalopathy possible due to sepsis.  supportive care  6.ESRD on hemodialysis. Nephrology consult to continue hemodialysis.  management as per Nephrology team  7. Hx ofDiabetes.  Target glucose management. Dietitian consult    All the records are reviewed and case discussed with Care Management/Social Workerr. Management plans discussed with the patient, family and they are in agreement.  CODE STATUS: Full.  TOTAL TIME TAKING CARE OF THIS PATIENT: 35 minutes.     POSSIBLE D/C IN 1-2 DAYS, DEPENDING ON CLINICAL CONDITION.   Vaughan Basta M.D on 12/26/2017   Between 7am to 6pm - Pager - [phone]  After 6pm go to [email or web address] - password [email or web address] [facility]  Office  [phone]  CC: Primary care  physician; Juluis Pitch, MD  Note: This dictation was prepared with Dragon dictation along with smaller phrase technology. Any transcriptional errors that result from this process are unintentional.

## 2017-12-26 NOTE — Progress Notes (Signed)
PULMONARY / CRITICAL CARE MEDICINE   Name: Dennis Walker MRN: [id number] DOB: 18-Jun-1951    ADMISSION DATE:  12/24/2017  HISTORY OF PRESENT ILLNESS:   Mr. Koskela 67 y.o.malewith a known history of multiple medical problems including, ESRD on HD, CVA, CAD, PVD among other things. The patient was sent from nursing home due to altered mental status. He was found high fever 103 and altered mental status. He is on home oxygen. He is intubated in the ED. Chest x-ray and abdominal CAT scan show pneumonia.    REVIEW OF SYSTEMS:   On vent- unable to acquire.  SUBJECTIVE:  Patient still with intermittent pyrexia  VITAL SIGNS: BP 101/66   Pulse 87   Temp (!) 97.3 F (36.3 C)   Resp (!) 22   Ht 5\' 8"  (1.727 m)   Wt 203 lb 11.3 oz (92.4 kg)   SpO2 99%   BMI 30.97 kg/m   HEMODYNAMICS:  no compromise  VENTILATOR SETTINGS: Vent Mode: PRVC FiO2 (%):  [40 %] 40 % Set Rate:  [16 bmp-17 bmp] 16 bmp Vt Set:  [500 mL] 500 mL PEEP:  [5 cmH20] 5 cmH20 Pressure Support:  [5 cmH20] 5 cmH20 Plateau Pressure:  [24 cmH20] 24 cmH20  INTAKE / OUTPUT: I/O last 3 completed shifts: In: 2648.7 [I.V.:1796.9; Other:120; NG/GT:184.7; IV Piggyback:547.1] Out: 438 [Urine:88; Emesis/NG output:350]  PHYSICAL EXAMINATION: GENERAL: comfortableonventilation.  EYES: Right eye injury; left pupils reactive HEENT: Head atraumatic, normocephalic. NECK: Supple, no jugular venous distention. No thyroid enlargement, no tenderness.  LUNGS: Normal breath sounds bilaterally, no wheezing, rales,rhonchi or crepitation. No use of accessory muscles of respiration.  CARDIOVASCULAR: S1, S2 normal. No murmurs, rubs, or gallops.  ABDOMEN: Soft, nontender, nondistended.Hypoactive bowel sounds present. EXTREMITIES: No cyanosis, or clubbing.Right BKA. Left leg trace edema. NEUROLOGIC:Unable to exam. PSYCHIATRIC: The patient isintubated and sedated. SKIN: No obvious rash, hyperpigmentation and chronic  venous stasis left leg   LABS:  BMET Recent Labs  Lab 12/24/17 1053 12/25/17 0407  NA 132* 132*  K 3.7 3.7  CL 96* 97*  CO2 25 23  BUN 27* 36*  CREATININE 5.30* 6.27*  GLUCOSE 113* 111*    Electrolytes Recent Labs  Lab 12/24/17 1053 12/25/17 0407  CALCIUM 8.6* 7.8*  PHOS  --  2.5    CBC Recent Labs  Lab 12/24/17 1053 12/25/17 0407  WBC 11.3* 8.9  HGB 13.4 14.2  HCT 40.7 43.5  PLT 122* 93*    Coag's Recent Labs  Lab 12/24/17 1057  INR 1.17    Sepsis Markers Recent Labs  Lab 12/24/17 1057 12/24/17 1432  LATICACIDVEN 0.9 1.5    ABG Recent Labs  Lab 12/24/17 1212 12/26/17 0352  PHART 7.46* 7.32*  PCO2ART 36 45  PO2ART 121* 135*    Liver Enzymes Recent Labs  Lab 12/24/17 1053 12/25/17 0407  AST 27  --   ALT 16*  --   ALKPHOS 67  --   BILITOT 0.9  --   ALBUMIN 3.9 2.9*    Cardiac Enzymes No results for input(s): TROPONINI, PROBNP in the last 168 hours.  Glucose Recent Labs  Lab 12/25/17 1045 12/25/17 1135 12/25/17 1626 12/25/17 1941 12/26/17 0030 12/26/17 0402  GLUCAP 70 137* 76 90 107* 122*    Imaging Dg Chest Port 1 View  Result Date: 12/25/2017 CLINICAL DATA:  Respiratory failure EXAM: PORTABLE CHEST 1 VIEW COMPARISON:  December 24, 2017 FINDINGS: The ETT is in good position. The OG tube terminates below today's film. A  vascular stent over the left upper chest is stable. No pneumothorax. Cardiomegaly persists. Mild opacity in the left mid lung is platelike and probably atelectasis. Recommend attention on follow-up. No overt edema. No other changes. IMPRESSION: 1. The ETT has been repositioned and is in good position. The OG tube terminates below today's film. 2. New platelike opacity in the left mid lung, likely atelectasis. Recommend attention on follow-up. 3. No other change. Electronically Signed   By: Dorise Bullion III M.D   On: 12/25/2017 07:54   Dg Abd Portable 1v  Result Date: 12/25/2017 CLINICAL DATA:  OG tube  placement EXAM: PORTABLE ABDOMEN - 1 VIEW COMPARISON:  August 05, 2017 FINDINGS: The OG tube terminates in the right mid abdomen, likely in the distal stomach. Mild fecal loading in the upper colon, incompletely visualized. No other change. IMPRESSION: 1. The OG tube appears to terminate in the distal stomach. 2. Mild fecal loading. Electronically Signed   By: Dorise Bullion III M.D   On: 12/25/2017 07:53    CULTURES: MRSA PCR negative Blood- MRSA +  ANTIBIOTICS: 6/23 Azactam- D/C 6/23 Vancomycin  SIGNIFICANT EVENTS: 6/23 Admitted to ICU  LINES/TUBES: Peripheral IVs, Foley  DISCUSSION: This 67 year old gentleman with his of ESRD on HD, CVA, DM admitted from NH with pyrexia and found to have MRSA bacteremia.  ASSESSMENT / PLAN: 1. Acute respiratory failure with hypoxia,  due to pneumonia HCAP.- MRSA Tolerated short time of vent trials today Plan: Continue ventilation, continue , lung recruitment  2. Altered Mental status -from sepsis On sedation holiday, patient did not follow commands. Plan: Cont vent management  3. MRSA bacteremia If patient continues to be pyrexial will need evaluation for colonization of shunt.    Plan:  Cont ABX, schedule TTE and consider TEE day 5  4. Sepsis from MRSA bacteremia Plan:  All antihypertensive meds D/Ced  5. Acute metabolic encephalopathy possible due to sepsis. Plan: supportive care  6. ESRD on hemodialysis. Nephrology consult to continue hemodialysis. Plan: management as per Nephrology team- for HD today  7. Hx of Diabetes.  Plan: Target glucose management. Dietitian consult  8. Hx CVA   I have dedicated a total of 40 minutes in critical care time minus all appropriate exclusions.   FAMILY  - Updates: Daughters  updated at bedside    Cammie Sickle, MD Pulmonary and [place] Pager: [phone]  12/26/2017, 5:52 AM

## 2017-12-26 NOTE — Progress Notes (Signed)
      INFECTIOUS DISEASE ATTENDING ADDENDUM:   Date: 12/26/2017  Patient name: Dennis Walker  Medical record number: [id number]  Date of birth: 1950/08/28        [facility] Antimicrobial Management Team Staphylococcus aureus bacteremia   Staphylococcus aureus bacteremia (SAB) is associated with a high rate of complications and mortality.  Specific aspects of clinical management are critical to optimizing the outcome of patients with SAB.  Therefore, the [facility] Antimicrobial Management Team [facility]) has initiated an intervention aimed at improving the management of SAB at [facility].  To do so, Infectious Diseases physicians are providing an evidence-based consult for the management of all patients with SAB.     Yes No Comments  Perform follow-up blood cultures (even if the patient is afebrile) to ensure clearance of bacteremia [x]  []  12/25/17 taken but will need to be repeated ONCE all lines are out  Remove vascular catheter and obtain follow-up blood cultures after the removal of the catheter [x]  []  He HAS LINES THAT WILL NEED TO BE REMOVED TO EFFECT CATHETER HOLIDAY  Perform echocardiography to evaluate for endocarditis (transthoracic ECHO is 40-50% sensitive, TEE is > 90% sensitive) [x]  []  Please keep in mind, that neither test can definitively EXCLUDE endocarditis, and that should clinical suspicion remain high for endocarditis the patient should then still be treated with an "endocarditis" duration of therapy = 6 weeks  Consult electrophysiologist to evaluate implanted cardiac device (pacemaker, ICD) []  []  DOes he have Cardiac device?  Ensure source control []  []  Have all abscesses been drained effectively? Have deep seeded infections (septic joints or osteomyelitis) had appropriate surgical debridement? Source unclear. Hx of BKA Needs thorough exam  Investigate for "metastatic" sites of infection [x]  []  Does the patient have ANY symptom or physical exam finding that would suggest a  deeper infection (back or neck pain that may be suggestive of vertebral osteomyelitis or epidural abscess, muscle pain that could be a symptom of pyomyositis)?  Keep in mind that for deep seeded infections MRI imaging with contrast is preferred rather than other often insensitive tests such as plain x-rays, especially early in a patient's presentation.  Change antibiotic therapy to vancomycin []  []  Beta-lactam antibiotics are preferred for MSSA due to higher cure rates.   If on Vancomycin, goal trough should be 15 - 20 mcg/mL  Estimated duration of IV antibiotic therapy:  4-8 weeks []  []  Consult case management for probably prolonged outpatient IV antibiotic therapy   Pager [phone] Cell [phone]    My pager is [place] 12/26/2017, 9:57 PM

## 2017-12-27 LAB — CBC WITH DIFFERENTIAL/PLATELET
BASOS ABS: 0 10*3/uL (ref 0–0.1)
BASOS PCT: 0 %
EOS ABS: 0.1 10*3/uL (ref 0–0.7)
Eosinophils Relative: 1 %
HCT: 33.5 % — ABNORMAL LOW (ref 40.0–52.0)
HEMOGLOBIN: 11.5 g/dL — AB (ref 13.0–18.0)
LYMPHS ABS: 0.7 10*3/uL — AB (ref 1.0–3.6)
Lymphocytes Relative: 7 %
MCH: 28.9 pg (ref 26.0–34.0)
MCHC: 34.3 g/dL (ref 32.0–36.0)
MCV: 84.4 fL (ref 80.0–100.0)
Monocytes Absolute: 0.9 10*3/uL (ref 0.2–1.0)
Monocytes Relative: 8 %
NEUTROS PCT: 84 %
Neutro Abs: 8.9 10*3/uL — ABNORMAL HIGH (ref 1.4–6.5)
PLATELETS: 89 10*3/uL — AB (ref 150–440)
RBC: 3.97 MIL/uL — AB (ref 4.40–5.90)
RDW: 19.3 % — ABNORMAL HIGH (ref 11.5–14.5)
WBC: 10.6 10*3/uL (ref 3.8–10.6)

## 2017-12-27 LAB — CULTURE, BLOOD (ROUTINE X 2)
SPECIAL REQUESTS: ADEQUATE
Special Requests: ADEQUATE

## 2017-12-27 LAB — BASIC METABOLIC PANEL
Anion gap: 12 (ref 5–15)
BUN: 81 mg/dL — AB (ref 8–23)
CO2: 20 mmol/L — ABNORMAL LOW (ref 22–32)
CREATININE: 8.49 mg/dL — AB (ref 0.61–1.24)
Calcium: 8 mg/dL — ABNORMAL LOW (ref 8.9–10.3)
Chloride: 99 mmol/L (ref 98–111)
GFR calc Af Amer: 7 mL/min — ABNORMAL LOW (ref >60)
GFR, EST NON AFRICAN AMERICAN: 6 mL/min — AB (ref >60)
GLUCOSE: 167 mg/dL — AB (ref 70–99)
POTASSIUM: 3.8 mmol/L (ref 3.5–5.1)
SODIUM: 131 mmol/L — AB (ref 135–145)

## 2017-12-27 LAB — TRIGLYCERIDES: TRIGLYCERIDES: 687 mg/dL — AB (ref <150)

## 2017-12-27 LAB — CULTURE, RESPIRATORY: CULTURE: NORMAL

## 2017-12-27 LAB — PHOSPHORUS: Phosphorus: 3.6 mg/dL (ref 2.5–4.6)

## 2017-12-27 LAB — HEPATITIS B CORE ANTIBODY, TOTAL: HEP B C TOTAL AB: NEGATIVE

## 2017-12-27 LAB — HEPATITIS B SURFACE ANTIGEN: HEP B S AG: NEGATIVE

## 2017-12-27 LAB — GLUCOSE, CAPILLARY
GLUCOSE-CAPILLARY: 136 mg/dL — AB (ref 70–99)
Glucose-Capillary: 112 mg/dL — ABNORMAL HIGH (ref 70–99)
Glucose-Capillary: 143 mg/dL — ABNORMAL HIGH (ref 70–99)
Glucose-Capillary: 147 mg/dL — ABNORMAL HIGH (ref 70–99)
Glucose-Capillary: 152 mg/dL — ABNORMAL HIGH (ref 70–99)

## 2017-12-27 LAB — MAGNESIUM: MAGNESIUM: 2.3 mg/dL (ref 1.7–2.4)

## 2017-12-27 LAB — POTASSIUM: POTASSIUM: 3.8 mmol/L (ref 3.5–5.1)

## 2017-12-27 LAB — HEPATITIS B SURFACE ANTIBODY,QUALITATIVE: Hep B S Ab: NONREACTIVE

## 2017-12-27 MED ORDER — MORPHINE SULFATE (PF) 2 MG/ML IV SOLN
2.0000 mg | INTRAVENOUS | Status: DC | PRN
  Administered 2017-12-27 – 2017-12-28 (×4): 2 mg via INTRAVENOUS
  Filled 2017-12-27 (×4): qty 1

## 2017-12-27 MED ORDER — ACETAMINOPHEN 325 MG PO TABS
650.0000 mg | ORAL_TABLET | Freq: Four times a day (QID) | ORAL | Status: DC | PRN
  Filled 2017-12-27: qty 2

## 2017-12-27 MED ORDER — ACETAMINOPHEN 650 MG RE SUPP
650.0000 mg | Freq: Four times a day (QID) | RECTAL | Status: DC | PRN
  Administered 2017-12-27: 650 mg via RECTAL
  Filled 2017-12-27: qty 1

## 2017-12-27 MED ORDER — FAMOTIDINE 20 MG PO TABS
20.0000 mg | ORAL_TABLET | Freq: Every day | ORAL | Status: DC
  Administered 2017-12-27 – 2017-12-29 (×2): 20 mg
  Filled 2017-12-27 (×3): qty 1

## 2017-12-27 NOTE — Progress Notes (Signed)
HD Tx started, w/o complication    70/11/00 0830  Vital Signs  Pulse Rate 83  Resp 18  BP (!) 128/103  Oxygen Therapy  SpO2 98 %  End Tidal CO2 (EtCO2) 38  During Hemodialysis Assessment  Blood Flow Rate (mL/min) 400 mL/min  Arterial Pressure (mmHg) -130 mmHg  Venous Pressure (mmHg) 180 mmHg  Transmembrane Pressure (mmHg) 50 mmHg  Ultrafiltration Rate (mL/min) 710 mL/min  Dialysate Flow Rate (mL/min) 600 ml/min  Conductivity: Machine  13.9  HD Safety Checks Performed Yes  Dialysis Fluid Bolus Normal Saline  Bolus Amount (mL) 250 mL  Intra-Hemodialysis Comments Tx initiated  Fistula / Graft Left Upper arm Arteriovenous fistula  Placement Date/Time: 12/24/17 1400   Placed prior to admission: Yes  Orientation: Left  Access Location: Upper arm  Access Type: Arteriovenous fistula  Site Condition No complications  Fistula / Graft Assessment Thrill;Bruit;Present  Status Accessed  Needle Size 15

## 2017-12-27 NOTE — Progress Notes (Signed)
Pre HD asessment Pt is intubated, but awake with eyes open able to follow command, nodded in agreement to where he ia (hospital) nodded no to pain. Understands he I getting dialysis.    12/27/17 0800  Neurological  Level of Consciousness Alert  Orientation Level Intubated/Tracheostomy - Unable to assess  Respiratory  Respiratory Pattern Regular  Chest Assessment Chest expansion symmetrical  Bilateral Breath Sounds Rhonchi  Cardiac  Pulse Regular  Heart Sounds S1, S2  ECG Monitor Yes  Vascular  R Radial Pulse +2  L Radial Pulse +2  Edema Generalized  Psychosocial  Psychosocial (WDL) WDL  Patient Behaviors Cooperative;Calm

## 2017-12-27 NOTE — Progress Notes (Signed)
[facility] [place] NAME: Dennis Walker    MR#:  [id number]  DATE OF BIRTH:  [birth year]  SUBJECTIVE:  CHIEF COMPLAINT:   Chief Complaint  Patient presents with  . Altered Mental Status   Came with AMS, Septic, intubated. Remains on vent, off sedation.  REVIEW OF SYSTEMS:   Intubated, can not give ROS.  ROS  DRUG ALLERGIES:   Allergies  Allergen Reactions  . Cefuroxime Itching  . Septra Ds  [Sulfamethoxazole-Trimethoprim]     Other reaction(s): Itching  . Shrimp [Shellfish Allergy] Other (See Comments)    Per mar. Unknown reaction   . Sulfa Antibiotics Other (See Comments)    Per mar. Unknown reaction    VITALS:  Blood pressure 114/68, pulse 98, temperature 98.1 F (36.7 C), temperature source Esophageal, resp. rate 17, height 5\' 8"  (1.727 m), weight 98.9 kg (218 lb 0.6 oz), SpO2 98 %.  PHYSICAL EXAMINATION:  GENERAL:  68 y.o.-year-old patient lying in the bed with no acute distress.  EYES: Pupils equal, round, reactive to light and accommodation. No scleral icterus. Extraocular muscles intact.  HEENT: Head atraumatic, normocephalic. Oropharynx and nasopharynx clear. ETT tube in place. NECK:  Supple, no jugular venous distention. No thyroid enlargement, no tenderness.  LUNGS: Normal breath sounds bilaterally, no wheezing, rales,rhonchi or crepitation. No use of accessory muscles of respiration.  CARDIOVASCULAR: S1, S2 normal. No murmurs, rubs, or gallops.  ABDOMEN: Soft, nontender, nondistended. Bowel sounds present. No organomegaly or mass.  EXTREMITIES: No pedal edema, cyanosis, or clubbing. Right side Bka. NEUROLOGIC: follows simple commands, no sedation. PSYCHIATRIC: The patient is intubated.  SKIN: No obvious rash, lesion, or ulcer.   Physical Exam LABORATORY PANEL:   CBC Recent Labs  Lab 12/27/17 0520  WBC 10.6  HGB 11.5*  HCT 33.5*  PLT 89*    ------------------------------------------------------------------------------------------------------------------  Chemistries  Recent Labs  Lab 12/24/17 1053  12/26/17 0602  12/27/17 0520  NA 132*   < >  --    < > 131*  K 3.7   < >  --    < > 3.8  CL 96*   < >  --    < > 99  CO2 25   < >  --    < > 20*  GLUCOSE 113*   < >  --    < > 167*  BUN 27*   < >  --    < > 81*  CREATININE 5.30*   < >  --    < > 8.49*  CALCIUM 8.6*   < >  --    < > 8.0*  MG  --   --  2.0  --   --   AST 27  --   --   --   --   ALT 16*  --   --   --   --   ALKPHOS 67  --   --   --   --   BILITOT 0.9  --   --   --   --    < > = values in this interval not displayed.   ------------------------------------------------------------------------------------------------------------------  Cardiac Enzymes No results for input(s): TROPONINI in the last 168 hours. ------------------------------------------------------------------------------------------------------------------  RADIOLOGY:  No results found.  ASSESSMENT AND PLAN:   Active Problems:   Acute on chronic respiratory failure with hypoxia (HCC)   1.Acute respiratory failure with hypoxia, due to pneumonia HCAP. Continue ventilation, continue , lung recruitment.  2. Altered Mental status -from sepsis  On sedation holiday, patient did follow commands.  Cont vent management  3. MRSA bacteremia  May need evaluation for colonization of shunt? ID on case.  Cont ABX, schedule TTE and consider TEE day 5  4. Sepsis from MRSA bacteremia  All antihypertensive meds D/Ced.   BP stable now.  5.Acute metabolic encephalopathy possible due to sepsis.  supportive care  6.ESRD on hemodialysis. Nephrology consult to continue hemodialysis.  management as per Nephrology team  7. Hx ofDiabetes.  Target glucose management. Dietitian consult    All the records are reviewed and case discussed with Care Management/Social  Workerr. Management plans discussed with the patient, family and they are in agreement.  CODE STATUS: Full.  TOTAL TIME TAKING CARE OF THIS PATIENT: 35 minutes.    POSSIBLE D/C IN 1-2 DAYS, DEPENDING ON CLINICAL CONDITION.   Vaughan Basta M.D on 12/27/2017   Between 7am to 6pm - Pager - [phone]  After 6pm go to [email or web address] - password [email or web address] [facility]  Office  [phone]  CC: Primary care physician; Juluis Pitch, MD  Note: This dictation was prepared with Dragon dictation along with smaller phrase technology. Any transcriptional errors that result from this process are unintentional.

## 2017-12-27 NOTE — Progress Notes (Signed)
Pharmacy Antibiotic Note  Dennis Walker is a 67 y.o. male admitted on 12/24/2017 with MRSA Bacteremia.  Pharmacy has been consulted for vancomycin dosing.  Plan: Vancomycin 1,000mg  every T-Th-Sa with HD at 1200. Obtain vanc trough 6/29 prior to dialysis.   Height: 5\' 8"  (172.7 cm) Weight: 218 lb 0.6 oz (98.9 kg) IBW/kg (Calculated) : 68.4  Temp (24hrs), Avg:97.6 F (36.4 C), Min:89.9 F (32.2 C), Max:100 F (37.8 C)  Recent Labs  Lab 12/24/17 1053 12/24/17 1057 12/24/17 1432 12/25/17 0407 12/26/17 0838 12/27/17 0520  WBC 11.3*  --   --  8.9 12.3* 10.6  CREATININE 5.30*  --   --  6.27* 7.99* 8.49*  LATICACIDVEN  --  0.9 1.5  --   --   --     Estimated Creatinine Clearance: 9.9 mL/min (A) (by C-G formula based on SCr of 8.49 mg/dL (H)).    Allergies  Allergen Reactions  . Cefuroxime Itching  . Septra Ds  [Sulfamethoxazole-Trimethoprim]     Other reaction(s): Itching  . Shrimp [Shellfish Allergy] Other (See Comments)    Per mar. Unknown reaction   . Sulfa Antibiotics Other (See Comments)    Per mar. Unknown reaction    Antimicrobials this admission: 6/23 aztreonam >> 6/23 6/23 levofloxacin >> 6/23 6/23 vancomycin >> 6/24 Zosyn >> 6/24   Dose adjustments this admission: 6/23 vancomycin 1,000mg  q48h >> 6/24 vancomycin 750mg  x1 dose & vancomycin 1,000mg  every Tuesday, Thursday, Saturday with HD  Microbiology results: 6/23 BCx: MRSA 6/24 BCx: no growth x 2 days  6/23 Sputum: consistent with normal flora  6/23 MRSA PCR: negative  Thank you for allowing pharmacy to be a part of this patient's care.  Severn Goddard L 12/27/2017 8:16 PM

## 2017-12-27 NOTE — Progress Notes (Signed)
Post HD Tx    12/27/17 1237  Vital Signs  Temp 98.7 F (37.1 C)  Pulse Rate 96  Pulse Rate Source Monitor  Resp 19  BP (!) 78/58  Oxygen Therapy  SpO2 100 %  O2 Device Ventilator  Pulse Oximetry Type Continuous  End Tidal CO2 (EtCO2) 45  Post-Hemodialysis Assessment  Rinseback Volume (mL) 250 mL  KECN 69.7 V  Dialyzer Clearance Lightly streaked  Duration of HD Treatment -hour(s) 3.5 hour(s)  Hemodialysis Intake (mL) 500 mL  UF Total -Machine (mL) 3057 mL  Net UF (mL) 2557 mL  Tolerated HD Treatment Yes  AVG/AVF Arterial Site Held (minutes) 10 minutes  AVG/AVF Venous Site Held (minutes) 10 minutes  Fistula / Graft Left Upper arm Arteriovenous fistula  Placement Date/Time: 12/24/17 1400   Placed prior to admission: Yes  Orientation: Left  Access Location: Upper arm  Access Type: Arteriovenous fistula  Status Deaccessed  Drainage Description None

## 2017-12-27 NOTE — Progress Notes (Signed)
HD Tx ended, UF goal met, tolerated well.    12/27/17 1230  Vital Signs  Pulse Rate 96  Resp 19  BP (!) 102/14  Oxygen Therapy  SpO2 99 %  End Tidal CO2 (EtCO2) 45  During Hemodialysis Assessment  HD Safety Checks Performed Yes  Dialysis Fluid Bolus Normal Saline  Bolus Amount (mL) 250 mL  Intra-Hemodialysis Comments Tx completed;Tolerated well   

## 2017-12-27 NOTE — Progress Notes (Signed)
Post HD assessment, no chang from baseline, pt stable.    12/27/17 1258  Neurological  Level of Consciousness Alert  Orientation Level Intubated/Tracheostomy - Unable to assess  Respiratory  Respiratory Pattern Regular  Chest Assessment Chest expansion symmetrical  Bilateral Breath Sounds Rhonchi  Cardiac  Pulse Regular  Heart Sounds S1, S2  ECG Monitor Yes  Vascular  R Radial Pulse +2  L Radial Pulse +2  Edema Generalized  Psychosocial  Psychosocial (WDL) WDL  Patient Behaviors Cooperative;Calm

## 2017-12-27 NOTE — Progress Notes (Signed)
[facility] [place] NAME: Dennis Walker    MR#:  [id number]  DATE OF BIRTH:  May 29, 1951  SUBJECTIVE:  CHIEF COMPLAINT:   Chief Complaint  Patient presents with  . Altered Mental Status   Came with AMS, Septic, intubated. Remains on vent, no sedation.  REVIEW OF SYSTEMS:   Intubated, can not give ROS.  ROS  DRUG ALLERGIES:   Allergies  Allergen Reactions  . Cefuroxime Itching  . Septra Ds  [Sulfamethoxazole-Trimethoprim]     Other reaction(s): Itching  . Shrimp [Shellfish Allergy] Other (See Comments)    Per mar. Unknown reaction   . Sulfa Antibiotics Other (See Comments)    Per mar. Unknown reaction    VITALS:  Blood pressure 96/77, pulse 82, temperature (P) 98.7 F (37.1 C), temperature source (P) Esophageal, resp. rate 18, height 5\' 8"  (1.727 m), weight (P) 98.9 kg (218 lb 0.6 oz), SpO2 100 %.  PHYSICAL EXAMINATION:  GENERAL:  67 y.o.-year-old patient lying in the bed with no acute distress.  EYES: Pupils equal, round, reactive to light and accommodation. No scleral icterus. Extraocular muscles intact.  HEENT: Head atraumatic, normocephalic. Oropharynx and nasopharynx clear. ETT tube in place. NECK:  Supple, no jugular venous distention. No thyroid enlargement, no tenderness.  LUNGS: Normal breath sounds bilaterally, no wheezing, rales,rhonchi or crepitation. No use of accessory muscles of respiration.  CARDIOVASCULAR: S1, S2 normal. No murmurs, rubs, or gallops.  ABDOMEN: Soft, nontender, nondistended. Bowel sounds present. No organomegaly or mass.  EXTREMITIES: No pedal edema, cyanosis, or clubbing.  NEUROLOGIC: follows simple commands, no sedation. PSYCHIATRIC: The patient is intubated.  SKIN: No obvious rash, lesion, or ulcer.   Physical Exam LABORATORY PANEL:   CBC Recent Labs  Lab 12/27/17 0520  WBC 10.6  HGB 11.5*  HCT 33.5*  PLT 89*    ------------------------------------------------------------------------------------------------------------------  Chemistries  Recent Labs  Lab 12/24/17 1053  12/26/17 0602  12/27/17 0520  NA 132*   < >  --    < > 131*  K 3.7   < >  --    < > 3.8  CL 96*   < >  --    < > 99  CO2 25   < >  --    < > 20*  GLUCOSE 113*   < >  --    < > 167*  BUN 27*   < >  --    < > 81*  CREATININE 5.30*   < >  --    < > 8.49*  CALCIUM 8.6*   < >  --    < > 8.0*  MG  --   --  2.0  --   --   AST 27  --   --   --   --   ALT 16*  --   --   --   --   ALKPHOS 67  --   --   --   --   BILITOT 0.9  --   --   --   --    < > = values in this interval not displayed.   ------------------------------------------------------------------------------------------------------------------  Cardiac Enzymes No results for input(s): TROPONINI in the last 168 hours. ------------------------------------------------------------------------------------------------------------------  RADIOLOGY:  No results found.  ASSESSMENT AND PLAN:   Active Problems:   Acute on chronic respiratory failure with hypoxia (HCC)   1.Acute respiratory failure with hypoxia, due to pneumonia HCAP. Continue ventilation, continue , lung recruitment.  2. Altered Mental status -from sepsis On sedation holiday, patient did follow commands.  Cont vent management  3. MRSA bacteremia  May need evaluation for colonization of shunt? ID on case.  Cont ABX, schedule TTE and consider TEE day 5  4. Sepsis from MRSA bacteremia  All antihypertensive meds D/Ced  5.Acute metabolic encephalopathy possible due to sepsis.  supportive care  6.ESRD on hemodialysis. Nephrology consult to continue hemodialysis.  management as per Nephrology team  7. Hx ofDiabetes.  Target glucose management. Dietitian consult    All the records are reviewed and case discussed with Care Management/Social Workerr. Management plans discussed  with the patient, family and they are in agreement.  CODE STATUS: Full.  TOTAL TIME TAKING CARE OF THIS PATIENT: 35 minutes.     POSSIBLE D/C IN 1-2 DAYS, DEPENDING ON CLINICAL CONDITION.   Vaughan Basta M.D on 12/27/2017   Between 7am to 6pm - Pager - [phone]  After 6pm go to [email or web address] - password [email or web address] [facility]  Office  [phone]  CC: Primary care physician; Juluis Pitch, MD  Note: This dictation was prepared with Dragon dictation along with smaller phrase technology. Any transcriptional errors that result from this process are unintentional.

## 2017-12-27 NOTE — Progress Notes (Signed)
CRITICAL CARE NOTE  CC  follow up respiratory failure  SUBJECTIVE Patient remains critically ill Prognosis is guarded On vent +MRSA bacteremia and pneumonia      SIGNIFICANT EVENTS    BP 103/60   Pulse 84   Temp (!) 89.9 F (32.2 C) (Esophageal)   Resp 18   Ht 5\' 8"  (1.727 m)   Wt 210 lb 8.6 oz (95.5 kg)   SpO2 100%   BMI 32.01 kg/m    REVIEW OF SYSTEMS  PATIENT IS UNABLE TO PROVIDE COMPLETE REVIEW OF SYSTEM S DUE TO SEVERE CRITICAL ILLNESS AND ENCEPHALOPATHY   PHYSICAL EXAMINATION:  GENERAL:critically ill appearing, +resp distress HEAD: Normocephalic, atraumatic.  EYES: Pupils equal, round, reactive to light.  No scleral icterus.  MOUTH: Moist mucosal membrane. NECK: Supple. No thyromegaly. No nodules. No JVD.  PULMONARY: +rhonchi, +wheezing CARDIOVASCULAR: S1 and S2. Regular rate and rhythm. No murmurs, rubs, or gallops.  GASTROINTESTINAL: Soft, nontender, -distended. No masses. Positive bowel sounds. No hepatosplenomegaly.  MUSCULOSKELETAL: No swelling, clubbing, or edema.  NEUROLOGIC: obtunded, GCS<8 SKIN:intact,warm,dry  ASSESSMENT AND PLAN SYNOPSIS 67 year old gentleman with his of ESRD on HD, CVA, DM admitted from NH with pyrexia and found to have MRSA bacteremia with HCAP and severe resp failure on Vent.    Severe Hypoxic and Hypercapnic Respiratory Failure -continue vent support -Wean as tolerated   Renal Failure-ESRD on HD Follow up nephrology  NEUROLOGY - intubated and sedated - minimal sedation to achieve a RASS goal: -1   Septic shock-MRSA bacteremia -use vasopressors to keep MAP>65 if needed -follow ABG and LA -continue IV abx as prescibed  CARDIAC ICU monitoring  ID -continue IV abx as prescibed -follow up cultures   DVT/GI PRX ordered TRANSFUSIONS AS NEEDED MONITOR FSBS ASSESS the need for LABS as needed   Critical Care Time devoted to patient care services described in this note is 32 minutes.   Overall,  patient is critically ill, prognosis is guarded.  Patient with Multiorgan failure and at high risk for cardiac arrest and death.   Wean vent and sedation as tolerated  Corrin Parker, M.D.  [facility]  Medical Director [place] Director [facility] Cardio-Pulmonary Department

## 2017-12-27 NOTE — Progress Notes (Signed)
      INFECTIOUS DISEASE ATTENDING ADDENDUM:   Date: 12/27/2017  Patient name: Dennis Walker  Medical record number: [id number]  Date of birth: August 02, 1950        [facility] Antimicrobial Management Team Staphylococcus aureus bacteremia   Staphylococcus aureus bacteremia (SAB) is associated with a high rate of complications and mortality.  Specific aspects of clinical management are critical to optimizing the outcome of patients with SAB.  Therefore, the [facility] Antimicrobial Management Team [facility]) has initiated an intervention aimed at improving the management of SAB at [facility].  To do so, Infectious Diseases physicians are providing an evidence-based consult for the management of all patients with SAB.     Yes No Comments  Perform follow-up blood cultures (even if the patient is afebrile) to ensure clearance of bacteremia [x]  []  12/25/17 taken and NO LINES were in place  Remove vascular catheter and obtain follow-up blood cultures after the removal of the catheter [x]  []  He does not have central lines and believe only had peripheral IVs and fistula  Perform echocardiography to evaluate for endocarditis (transthoracic ECHO is 40-50% sensitive, TEE is > 90% sensitive) [x]  []  Please keep in mind, that neither test can definitively EXCLUDE endocarditis, and that should clinical suspicion remain high for endocarditis the patient should then still be treated with an "endocarditis" duration of therapy = 6 weeks  Consult electrophysiologist to evaluate implanted cardiac device (pacemaker, ICD) []  []  DOes he have Cardiac device?  Ensure source control []  []  Have all abscesses been drained effectively? Have deep seeded infections (septic joints or osteomyelitis) had appropriate surgical debridement? Source unclear. Hx of BKA Needs thorough exam, also has back pain that ICU MD is going to evaluate after extubation  Investigate for "metastatic" sites of infection [x]  []  Does the patient have ANY  symptom or physical exam finding that would suggest a deeper infection (back or neck pain that may be suggestive of vertebral osteomyelitis or epidural abscess, muscle pain that could be a symptom of pyomyositis)?  Keep in mind that for deep seeded infections MRI imaging with contrast is preferred rather than other often insensitive tests such as plain x-rays, especially early in a patient's presentation.  Change antibiotic therapy to vancomycin [x]  []  Beta-lactam antibiotics are preferred for MSSA due to higher cure rates.   If on Vancomycin, goal trough should be 15 - 20 mcg/mL  Estimated duration of IV antibiotic therapy:  4-8 weeks []  []  Consult case management for probably prolonged outpatient IV antibiotic therapy   Pager [phone] Cell [phone]    My pager is [place] 12/27/2017, 2:15 PM

## 2017-12-27 NOTE — Progress Notes (Signed)
PHARMACIST - PHYSICIAN COMMUNICATION  CONCERNING: IV to Oral Route Change Policy  RECOMMENDATION: This patient is receiving famotidine by the intravenous route.  Based on criteria approved by the Pharmacy and Therapeutics Committee, the intravenous medication(s) is/are being converted to the equivalent oral dose form(s).   DESCRIPTION: These criteria include:  The patient is eating (either orally or via tube) and/or has been taking other orally administered medications for a least 24 hours  The patient has no evidence of active gastrointestinal bleeding or impaired GI absorption (gastrectomy, short bowel, patient on TNA or NPO).  If you have questions about this conversion, please contact the Pharmacy Department  []   [phone] )  [facility] [x]   [phone] )  [facility] []   [phone] )  [facility] []   [phone] )  [facility] []   [phone] )  [place], [facility] 12/27/2017 9:51 AM

## 2017-12-27 NOTE — Progress Notes (Signed)
[facility]  ROUNDING NOTE   Subjective:   No family at bedside. Did not get hemodialysis yesterday.   Tmax 100.8  Hemodialysis for later today  Objective:  Vital signs in last 24 hours:  Temp:  [89.9 F (32.2 C)-100.8 F (38.2 C)] 89.9 F (32.2 C) (06/26 0400) Pulse Rate:  [81-97] 84 (06/26 0600) Resp:  [16-30] 18 (06/26 0600) BP: (81-155)/(47-104) 103/60 (06/26 0600) SpO2:  [91 %-100 %] 100 % (06/26 0817) FiO2 (%):  [40 %] 40 % (06/26 0817) Weight:  [95.5 kg (210 lb 8.6 oz)] 95.5 kg (210 lb 8.6 oz) (06/26 0433)  Weight change: 3.1 kg (6 lb 13.4 oz) Filed Weights   12/24/17 1402 12/26/17 0415 12/27/17 0433  Weight: 81.6 kg (179 lb 14.3 oz) 92.4 kg (203 lb 11.3 oz) 95.5 kg (210 lb 8.6 oz)    Intake/Output: I/O last 3 completed shifts: In: 2388.4 [I.V.:898.4; NG/GT:1490] Out: 30 [Urine:30]   Intake/Output this shift:  No intake/output data recorded.  Physical Exam: General: Critically Ill  Head: ETT OGT  Eyes: Anicteric, PERRL  Neck: Supple, trachea midline  Lungs:  PRVC fIO2 40%  Heart: tachycardia  Abdomen:  Soft, nontender, obese  Extremities:  right BKA, trace peripheral edema  Neurologic: Intubated, sedated  Skin: No lesions  Access: Left AVF    Basic Metabolic Panel: Recent Labs  Lab 12/24/17 1053 12/25/17 0407 12/26/17 0602 12/26/17 0838 12/27/17 0520  NA 132* 132*  --  129* 131*  K 3.7 3.7  --  4.2 3.8  CL 96* 97*  --  97* 99  CO2 25 23  --  22 20*  GLUCOSE 113* 111*  --  127* 167*  BUN 27* 36*  --  60* 81*  CREATININE 5.30* 6.27*  --  7.99* 8.49*  CALCIUM 8.6* 7.8*  --  7.8* 8.0*  MG  --   --  2.0  --   --   PHOS  --  2.5  --  3.6  --     Liver Function Tests: Recent Labs  Lab 12/24/17 1053 12/25/17 0407 12/26/17 0838  AST 27  --   --   ALT 16*  --   --   ALKPHOS 67  --   --   BILITOT 0.9  --   --   PROT 7.6  --   --   ALBUMIN 3.9 2.9* 2.6*   No results for input(s): LIPASE, AMYLASE in the last 168  hours. Recent Labs  Lab 12/24/17 1057  AMMONIA 48*    CBC: Recent Labs  Lab 12/24/17 1053 12/25/17 0407 12/26/17 0838 12/27/17 0520  WBC 11.3* 8.9 12.3* 10.6  NEUTROABS 10.3*  --   --  8.9*  HGB 13.4 14.2 11.9* 11.5*  HCT 40.7 43.5 36.1* 33.5*  MCV 84.6 85.9 84.2 84.4  PLT 122* 93* 89* 89*    Cardiac Enzymes: No results for input(s): CKTOTAL, CKMB, CKMBINDEX, TROPONINI in the last 168 hours.  BNP: Invalid input(s): POCBNP  CBG: Recent Labs  Lab 12/26/17 1648 12/26/17 1907 12/26/17 2339 12/27/17 0325 12/27/17 0733  GLUCAP 146* 153* 166* 143* 147*    Microbiology: Results for orders placed or performed during the hospital encounter of 12/24/17  Blood culture (routine x 2)     Status: None (Preliminary result)   Collection Time: 12/24/17 10:54 AM  Result Value Ref Range Status   Specimen Description BLOOD RIGHT FATTY CASTS  Final   Special Requests   Final    BOTTLES  DRAWN AEROBIC AND ANAEROBIC Blood Culture adequate volume   Culture  Setup Time   Final    GRAM POSITIVE COCCI IN BOTH AEROBIC AND ANAEROBIC BOTTLES CRITICAL RESULT CALLED TO, READ BACK BY AND VERIFIED WITH: GARRETT COFFEE AT 2120 12/24/17.MSS/PMH Performed at [facility], [address] [place], [place] [address]    Culture [facility] POSITIVE COCCI  Final   Report Status PENDING  Incomplete  Blood culture (routine x 2)     Status: Abnormal (Preliminary result)   Collection Time: 12/24/17 10:55 AM  Result Value Ref Range Status   Specimen Description   Final    BLOOD RIGHT ARM Performed at [facility], [address], [place], [place] [address]    Special Requests   Final    BOTTLES DRAWN AEROBIC AND ANAEROBIC Blood Culture adequate volume Performed at [facility], [address] [place], [place] [address]    Culture  Setup Time   Final    Organism ID to follow GRAM POSITIVE COCCI IN BOTH AEROBIC AND ANAEROBIC BOTTLES CRITICAL RESULT CALLED TO, READ BACK BY  AND VERIFIED WITH: GARRETT COFFEY AT 2120 12/24/17. MSS Performed at [facility], [address] [place], [place] [address]    Culture STAPHYLOCOCCUS AUREUS (A)  Final   Report Status PENDING  Incomplete  Blood Culture ID Panel (Reflexed)     Status: Abnormal   Collection Time: 12/24/17 10:55 AM  Result Value Ref Range Status   Enterococcus species NOT DETECTED NOT DETECTED Final   Listeria monocytogenes NOT DETECTED NOT DETECTED Final   Staphylococcus species DETECTED (A) NOT DETECTED Final    Comment: CRITICAL RESULT CALLED TO, READ BACK BY AND VERIFIED WITH: GARRETT COFFEY AT 2120 12/24/17. MSS    Staphylococcus aureus DETECTED (A) NOT DETECTED Final    Comment: Methicillin (oxacillin)-resistant Staphylococcus aureus (MRSA). MRSA is predictably resistant to beta-lactam antibiotics (except ceftaroline). Preferred therapy is vancomycin unless clinically contraindicated. Patient requires contact precautions if  hospitalized. CRITICAL RESULT CALLED TO, READ BACK BY AND VERIFIED WITH: GARRETT COFFEY AT 2120 12/24/17. MSS    Methicillin resistance DETECTED (A) NOT DETECTED Final    Comment: CRITICAL RESULT CALLED TO, READ BACK BY AND VERIFIED WITH: GARRETT COFFEY AT 2120 12/24/17. MSS    Streptococcus species NOT DETECTED NOT DETECTED Final   Streptococcus agalactiae NOT DETECTED NOT DETECTED Final   Streptococcus pneumoniae NOT DETECTED NOT DETECTED Final   Streptococcus pyogenes NOT DETECTED NOT DETECTED Final   Acinetobacter baumannii NOT DETECTED NOT DETECTED Final   Enterobacteriaceae species NOT DETECTED NOT DETECTED Final   Enterobacter cloacae complex NOT DETECTED NOT DETECTED Final   Escherichia coli NOT DETECTED NOT DETECTED Final   Klebsiella oxytoca NOT DETECTED NOT DETECTED Final   Klebsiella pneumoniae NOT DETECTED NOT DETECTED Final   Proteus species NOT DETECTED NOT DETECTED Final   Serratia marcescens NOT DETECTED NOT DETECTED Final   Haemophilus influenzae  NOT DETECTED NOT DETECTED Final   Neisseria meningitidis NOT DETECTED NOT DETECTED Final   Pseudomonas aeruginosa NOT DETECTED NOT DETECTED Final   Candida albicans NOT DETECTED NOT DETECTED Final   Candida glabrata NOT DETECTED NOT DETECTED Final   Candida krusei NOT DETECTED NOT DETECTED Final   Candida parapsilosis NOT DETECTED NOT DETECTED Final   Candida tropicalis NOT DETECTED NOT DETECTED Final    Comment: Performed at [facility], [address] [place],  [address]  MRSA PCR Screening     Status: None   Collection Time:  12/24/17  2:18 PM  Result Value Ref Range Status   MRSA by PCR NEGATIVE NEGATIVE Final    Comment:        The GeneXpert MRSA Assay (FDA approved for NASAL specimens only), is one component of a comprehensive MRSA colonization surveillance program. It is not intended to diagnose MRSA infection nor to guide or monitor treatment for MRSA infections. Performed at [facility], [address] [place], [place] [address]   Culture, respiratory (NON-Expectorated)     Status: None (Preliminary result)   Collection Time: 12/24/17  3:08 PM  Result Value Ref Range Status   Specimen Description   Final    SPUTUM Performed at [facility], [address] [place], [place] [address]    Special Requests   Final    NONE Performed at [facility], [address] [place], [place] [address]    Gram Stain   Final    RARE WBC PRESENT, PREDOMINANTLY PMN NO SQUAMOUS EPITHELIAL CELLS SEEN MODERATE GRAM POSITIVE COCCI IN CLUSTERS    Culture   Final    CULTURE REINCUBATED FOR BETTER GROWTH Performed at [facility], [place] [address] [place], [place] [address]    Report Status PENDING  Incomplete  Culture, blood (Routine X 2) w Reflex to ID Panel     Status: None (Preliminary result)   Collection Time: 12/25/17 10:03 AM  Result Value Ref Range Status   Specimen Description BLOOD BLOOD RIGHT HAND  Final   Special  Requests   Final    BOTTLES DRAWN AEROBIC AND ANAEROBIC Blood Culture adequate volume   Culture   Final    NO GROWTH 2 DAYS Performed at [facility], [address] [place], [place] [address]    Report Status PENDING  Incomplete  Culture, blood (Routine X 2) w Reflex to ID Panel     Status: None (Preliminary result)   Collection Time: 12/25/17 10:25 AM  Result Value Ref Range Status   Specimen Description BLOOD BLOOD RIGHT HAND  Final   Special Requests   Final    BOTTLES DRAWN AEROBIC AND ANAEROBIC Blood Culture adequate volume   Culture   Final    NO GROWTH 2 DAYS Performed at [facility], [address] [place], [place] [address]    Report Status PENDING  Incomplete    Coagulation Studies: Recent Labs    12/24/17 1057  LABPROT 14.8  INR 1.17    Urinalysis: Recent Labs    12/24/17 1052  COLORURINE AMBER*  LABSPEC 1.022  PHURINE 5.0  GLUCOSEU NEGATIVE  HGBUR LARGE*  BILIRUBINUR NEGATIVE  KETONESUR NEGATIVE  PROTEINUR >=300*  NITRITE NEGATIVE  LEUKOCYTESUR NEGATIVE      Imaging: No results found.   Medications:   . famotidine (PEPCID) IV Stopped (12/26/17 2137)  . fentaNYL infusion INTRAVENOUS 25 mcg/hr (12/27/17 3734)  . norepinephrine (LEVOPHED) Adult infusion Stopped (12/26/17 2155)  . propofol (DIPRIVAN) infusion Stopped (12/25/17 0213)  . vancomycin Stopped (12/26/17 1253)   . B-complex with vitamin C  1 tablet Per Tube QHS  . chlorhexidine gluconate (MEDLINE KIT)  15 mL Mouth Rinse BID  . feeding supplement (NEPRO CARB STEADY)  1,000 mL Per Tube Q24H  . feeding supplement (PRO-STAT SUGAR FREE 64)  60 mL Per Tube BID  . heparin  5,000 Units Subcutaneous Q8H  . mouth rinse  15 mL Mouth Rinse 10 times per day   acetaminophen, albuterol, bisacodyl, diphenhydrAMINE, fentaNYL, ibuprofen, midazolam, ondansetron **OR**  ondansetron (ZOFRAN) IV, sennosides  Assessment/ Plan:  [name] is a 67 y.o. black male with end stage  renal disease on Hemodialysis, diabetes mellitus type II, hypertension, peripheral vascular disease, coronary artery disease, hyperlipidemia, congestive heart failure, left BKA admitted on 08/05/2017 for chest pain. Currently intubated with acute respiratory failure  TTS [facility] left AVF EDW 90.5kg Resident of [facility]  1. End Stage Renal Disease: on hemodialysis. TTS schedule. Last hemodialysis was Saturday 6/22 as outpatient. Hemodialysis for later today. Orders prepared. Dialysis for today and then resume TTS schedule.  2. Hypotension with sepsis: secondary to pneumonia. Blood cultures from 6/23 growing MRSA Weaned off vasopressors - Holding bumetanide and lisinopril. - IV vancomycin   3. Anemia of chronic kidney disease: hemoglobin 11.5 - EPO as outpatient.   4. Secondary Hyperparathyroidism:  - Sevelamer with meals when tolerating PO   5. Diabetes mellitus type II with chronic kidney disease: insulin dependent.    LOS: 3 Dennis Walker 6/26/20198:28 AM

## 2017-12-27 NOTE — Progress Notes (Signed)
Pre HD Tx    12/27/17 0810  Vital Signs  Temp 98.7 F (37.1 C)  Temp Source Esophageal  Pulse Rate 83  Pulse Rate Source Monitor  Resp 17  BP 101/69  BP Location Right Arm  BP Method Automatic  Patient Position (if appropriate) Lying  Oxygen Therapy  SpO2 99 %  O2 Device Ventilator  Pulse Oximetry Type Continuous  End [place] CO2 (EtCO2) 38  Pain Assessment  Pain Scale 0-10  Pain Score 0  Dialysis Weight  Weight 98.9 kg (218 lb 0.6 oz)  Type of Weight Pre-Dialysis  Time-Out for Hemodialysis  What Procedure? HD  Pt Identifiers(min of two) First/Last Name;MRN/Account#  Correct Site? Yes  Correct Side? Yes  Correct Procedure? Yes  Consents Verified? Yes  Rad Studies Available? N/A  Engineer, civil (consulting) Number [phone]  Station Number  (Bed side ICU)  UF/Alarm Test Passed  Conductivity: Meter 14  Conductivity: Machine  14  pH 7.4  Reverse Osmosis Portable  Normal Saline Lot Number [id number]  Dialyzer Lot Number [id number]  Disposable Set Lot Number [id number]  Machine Temperature 98.6 F (37 C)  Musician and Audible Yes  Blood Lines Intact and Secured Yes  Pre Treatment Patient Checks  Vascular access used during treatment Fistula  Hepatitis B Surface Antigen Results Negative  Date Hepatitis B Surface Antigen Drawn 11/03/17  Isolation Initiated Yes  Hepatitis B Surface Antibody 10  Date Hepatitis B Surface Antibody Drawn 11/03/17  Hemodialysis Consent Verified Yes  Hemodialysis Standing Orders Initiated Yes  ECG (Telemetry) Monitor On Yes  Prime Ordered Normal Saline  Length of  DialysisTreatment -hour(s) 3.5 Hour(s)  Dialysis Treatment Comments  (Na 140)  Dialyzer Elisio 17H NR  Dialysate 3K, 2.5 Ca  Dialysis Anticoagulant None  Dialysate Flow Ordered 600  Blood Flow Rate Ordered 400 mL/min  Ultrafiltration Goal 2 Liters  Dialysis Blood Pressure Support Ordered Normal Saline

## 2017-12-28 ENCOUNTER — Other Ambulatory Visit: Payer: Self-pay

## 2017-12-28 LAB — BASIC METABOLIC PANEL
Anion gap: 12 (ref 5–15)
BUN: 59 mg/dL — ABNORMAL HIGH (ref 8–23)
CO2: 26 mmol/L (ref 22–32)
CREATININE: 7.08 mg/dL — AB (ref 0.61–1.24)
Calcium: 8.5 mg/dL — ABNORMAL LOW (ref 8.9–10.3)
Chloride: 97 mmol/L — ABNORMAL LOW (ref 98–111)
GFR, EST AFRICAN AMERICAN: 8 mL/min — AB (ref >60)
GFR, EST NON AFRICAN AMERICAN: 7 mL/min — AB (ref >60)
Glucose, Bld: 135 mg/dL — ABNORMAL HIGH (ref 70–99)
POTASSIUM: 4 mmol/L (ref 3.5–5.1)
SODIUM: 135 mmol/L (ref 135–145)

## 2017-12-28 LAB — GLUCOSE, CAPILLARY
GLUCOSE-CAPILLARY: 125 mg/dL — AB (ref 70–99)
GLUCOSE-CAPILLARY: 131 mg/dL — AB (ref 70–99)
Glucose-Capillary: 131 mg/dL — ABNORMAL HIGH (ref 70–99)
Glucose-Capillary: 108 mg/dL — ABNORMAL HIGH (ref 70–99)
Glucose-Capillary: 170 mg/dL — ABNORMAL HIGH (ref 70–99)

## 2017-12-28 LAB — CBC
HEMATOCRIT: 35 % — AB (ref 40.0–52.0)
Hemoglobin: 11.4 g/dL — ABNORMAL LOW (ref 13.0–18.0)
MCH: 27.6 pg (ref 26.0–34.0)
MCHC: 32.6 g/dL (ref 32.0–36.0)
MCV: 84.8 fL (ref 80.0–100.0)
Platelets: 90 10*3/uL — ABNORMAL LOW (ref 150–440)
RBC: 4.13 MIL/uL — ABNORMAL LOW (ref 4.40–5.90)
RDW: 19.2 % — AB (ref 11.5–14.5)
WBC: 10.1 10*3/uL (ref 3.8–10.6)

## 2017-12-28 LAB — ECHOCARDIOGRAM COMPLETE
HEIGHTINCHES: 68 in
WEIGHTICAEL: 3259.28 [oz_av]

## 2017-12-28 LAB — VANCOMYCIN, RANDOM: Vancomycin Rm: 24

## 2017-12-28 MED ORDER — CHLORHEXIDINE GLUCONATE CLOTH 2 % EX PADS
6.0000 | MEDICATED_PAD | Freq: Every day | CUTANEOUS | Status: DC
  Administered 2017-12-28 – 2017-12-29 (×2): 6 via TOPICAL

## 2017-12-28 MED ORDER — RENA-VITE PO TABS
1.0000 | ORAL_TABLET | Freq: Every day | ORAL | Status: DC
  Administered 2017-12-28: 1 via ORAL
  Filled 2017-12-28: qty 1

## 2017-12-28 MED ORDER — NEPRO/CARBSTEADY PO LIQD: 237.0000 mL | Freq: Three times a day (TID) | ORAL | Status: DC

## 2017-12-28 MED ORDER — HYDRALAZINE HCL 20 MG/ML IJ SOLN
10.0000 mg | Freq: Four times a day (QID) | INTRAMUSCULAR | Status: DC | PRN
  Administered 2017-12-28: 10 mg via INTRAVENOUS
  Filled 2017-12-28: qty 1

## 2017-12-28 NOTE — Progress Notes (Signed)
      INFECTIOUS DISEASE ATTENDING ADDENDUM:   Date: 12/28/2017  Patient name: Dennis Walker  Medical record number: [id number]  Date of birth: 1951-02-23        [facility] Antimicrobial Management Team Staphylococcus aureus bacteremia   Staphylococcus aureus bacteremia (SAB) is associated with a high rate of complications and mortality.  Specific aspects of clinical management are critical to optimizing the outcome of patients with SAB.  Therefore, the [facility] Antimicrobial Management Team [facility]) has initiated an intervention aimed at improving the management of SAB at [facility].  To do so, Infectious Diseases physicians are providing an evidence-based consult for the management of all patients with SAB.     Yes No Comments  Perform follow-up blood cultures (even if the patient is afebrile) to ensure clearance of bacteremia [x]  []  12/25/17 taken and NO LINES were in place NO GROWTH to date  Remove vascular catheter and obtain follow-up blood cultures after the removal of the catheter [x]  []  He does not have central lines and believe only had peripheral IVs and fistula  Perform echocardiography to evaluate for endocarditis (transthoracic ECHO is 40-50% sensitive, TEE is > 90% sensitive) [x]  []  Please keep in mind, that neither test can definitively EXCLUDE endocarditis, and that should clinical suspicion remain high for endocarditis the patient should then still be treated with an "endocarditis" duration of therapy = 6 weeks  Consult electrophysiologist to evaluate implanted cardiac device (pacemaker, ICD) []  []  DOes he have Cardiac device?  Ensure source control []  []  Have all abscesses been drained effectively? Have deep seeded infections (septic joints or osteomyelitis) had appropriate surgical debridement? Source unclear. Hx of BKA Needs thorough exam, also has back pain that ICU MD is going to evaluate after extubation  Investigate for "metastatic" sites of infection [x]  []  Does the  patient have ANY symptom or physical exam finding that would suggest a deeper infection (back or neck pain that may be suggestive of vertebral osteomyelitis or epidural abscess, muscle pain that could be a symptom of pyomyositis)?  Keep in mind that for deep seeded infections MRI imaging with contrast is preferred rather than other often insensitive tests such as plain x-rays, especially early in a patient's presentation.  Change antibiotic therapy to vancomycin [x]  []  Beta-lactam antibiotics are preferred for MSSA due to higher cure rates.   If on Vancomycin, goal trough should be 15 - 20 mcg/mL  Estimated duration of IV antibiotic therapy:  4-8 weeks []  []  Consult case management for probably prolonged outpatient IV antibiotic therapy   Pager [phone] Cell [phone]    My pager is [place] 12/28/2017, 4:58 PM

## 2017-12-28 NOTE — Progress Notes (Signed)
Pre HD Tx    12/28/17 1015  Hand-Off documentation  Report given to (Full Name) Beatris Ship, RN   Report received from (Full Name) Donnel Saxon, RN   Vital Signs  Temp 98.4 F (36.9 C)  Temp Source Oral  Pulse Rate 87  Pulse Rate Source Monitor  Resp 20  BP 138/65  BP Location Right Arm  BP Method Automatic  Patient Position (if appropriate) Lying  Oxygen Therapy  SpO2 100 %  O2 Device Nasal Cannula  O2 Flow Rate (L/min) 4 L/min  Pulse Oximetry Type Continuous  Pain Assessment  Pain Scale 0-10  Pain Score 0  Time-Out for Hemodialysis  What Procedure? HD   Pt Identifiers(min of two) First/Last Name;MRN/Account#  Correct Site? Yes  Correct Side? Yes  Correct Procedure? Yes  Consents Verified? Yes  Rad Studies Available? N/A  Engineer, civil (consulting) Number [phone]  Station Number 1  UF/Alarm Test Passed  Conductivity: Meter 14  Conductivity: Machine  14  pH 7.2  Reverse Osmosis Main  Normal Saline Lot Number [id number]  Dialyzer Lot Number [id number]  Disposable Set Lot Number [id number]  Machine Temperature 98.6 F (37 C)  Musician and Audible Yes  Blood Lines Intact and Secured Yes  Pre Treatment Patient Checks  Vascular access used during treatment Graft  Hepatitis B Surface Antigen Results Negative  Date Hepatitis B Surface Antigen Drawn 11/03/17  Isolation Initiated Yes  Hepatitis B Surface Antibody 10  Date Hepatitis B Surface Antibody Drawn 11/03/17  Hemodialysis Consent Verified Yes  Hemodialysis Standing Orders Initiated Yes  ECG (Telemetry) Monitor On Yes  Prime Ordered Normal Saline  Length of  DialysisTreatment -hour(s) 3.5 Hour(s)  Dialysis Treatment Comments Na 140  Dialyzer Elisio 17H NR  Dialysate 3K, 2.5 Ca  Dialysis Anticoagulant None  Blood Flow Rate Ordered 400 mL/min  Ultrafiltration Goal 2 Liters  Dialysis Blood Pressure Support Ordered Normal Saline  Fistula / Graft Left Upper arm Arteriovenous fistula  Placement Date/Time:  12/24/17 1400   Placed prior to admission: Yes  Orientation: Left  Access Location: Upper arm  Access Type: Arteriovenous fistula  Site Condition No complications  Fistula / Graft Assessment Thrill;Bruit;Present  Status Patent  Drainage Description None

## 2017-12-28 NOTE — Progress Notes (Signed)
Pre HD assessment   12/28/17 1000  Neurological  Level of Consciousness Alert  Orientation Level Oriented to person;Oriented to place;Disoriented to time;Disoriented to situation  Respiratory  Respiratory Pattern Regular  Chest Assessment Chest expansion symmetrical  Cough None  Cardiac  Pulse Regular  Heart Sounds S1, S2  ECG Monitor Yes  Vascular  R Radial Pulse +2  L Radial Pulse +2  Edema Generalized  Psychosocial  Psychosocial (WDL) WDL  Patient Behaviors Cooperative;Calm

## 2017-12-28 NOTE — Progress Notes (Signed)
HD Tx ended early, due to clotting, MD present and aware, blood was returned and pt lost 1hr tx.    12/28/17 1300  Vital Signs  Pulse Rate (!) 101  Pulse Rate Source Monitor  Resp (!) 24  BP (!) 148/80  BP Location Right Arm  BP Method Automatic  Patient Position (if appropriate) Lying  Oxygen Therapy  SpO2 99 %  O2 Device Nasal Cannula  O2 Flow Rate (L/min) 4 L/min  Pulse Oximetry Type Continuous  During Hemodialysis Assessment  Blood Flow Rate (mL/min) 400 mL/min  Arterial Pressure (mmHg) -170 mmHg  Venous Pressure (mmHg) 220 mmHg  Transmembrane Pressure (mmHg) 50 mmHg  Ultrafiltration Rate (mL/min) 820 mL/min  Dialysate Flow Rate (mL/min) 600 ml/min  Conductivity: Machine  14  HD Safety Checks Performed Yes

## 2017-12-28 NOTE — Progress Notes (Signed)
Pharmacy Antibiotic Note  Dennis Walker is a 67 y.o. male admitted on 12/24/2017 with MRSA Bacteremia.  Pharmacy has been consulted for vancomycin dosing.  Plan: Vancomycin 1,000mg  every T-Th-Sa with HD at 1200. Obtain vanc trough 6/29 prior to dialysis.   Height: 5\' 8"  (172.7 cm) Weight: 195 lb 8.8 oz (88.7 kg) IBW/kg (Calculated) : 68.4  Temp (24hrs), Avg:99 F (37.2 C), Min:98.1 F (36.7 C), Max:100 F (37.8 C)  Recent Labs  Lab 12/24/17 1053 12/24/17 1057 12/24/17 1432 12/25/17 0407 12/26/17 0838 12/27/17 0520 12/28/17 0601 12/28/17 0602  WBC 11.3*  --   --  8.9 12.3* 10.6  --  10.1  CREATININE 5.30*  --   --  6.27* 7.99* 8.49* 7.08*  --   LATICACIDVEN  --  0.9 1.5  --   --   --   --   --     Estimated Creatinine Clearance: 11.3 mL/min (A) (by C-G formula based on SCr of 7.08 mg/dL (H)).    Allergies  Allergen Reactions  . Cefuroxime Itching  . Septra Ds  [Sulfamethoxazole-Trimethoprim]     Other reaction(s): Itching  . Shrimp [Shellfish Allergy] Other (See Comments)    Per mar. Unknown reaction   . Sulfa Antibiotics Other (See Comments)    Per mar. Unknown reaction    Antimicrobials this admission: 6/23 aztreonam >> 6/23 6/23 levofloxacin >> 6/23 6/23 vancomycin >> 6/24 Zosyn >> 6/24   Dose adjustments this admission: 6/23 vancomycin 1,000mg  q48h >> 6/24 vancomycin 750mg  x1 dose & vancomycin 1,000mg  every Tuesday, Thursday, Saturday with HD  Microbiology results: 6/23 BCx: MRSA 6/23 Sputum: consistent with normal flora  6/23 MRSA PCR: negative 6/24 BCx: no growth x 3 days   Thank you for allowing pharmacy to be a part of this patient's care.  Tyson Babinski 12/28/2017 11:22 AM

## 2017-12-28 NOTE — Progress Notes (Signed)
CRITICAL CARE NOTE  CC  follow up respiratory failure successfully extubated   SUBJECTIVE +MRSA bacteremia and pneumonia Low BP-re-check BP Alert and awake Intermittently lethargic Plan for HD Remains SD status      SIGNIFICANT EVENTS Extubated 6/26   BP (!) 105/59   Pulse 84   Temp 98.7 F (37.1 C) (Oral)   Resp 17   Ht 5\' 8"  (1.727 m)   Wt 195 lb 8.8 oz (88.7 kg)   SpO2 97%   BMI 29.73 kg/m    REVIEW OF SYSTEMS Limited due to lethargy  PHYSICAL EXAMINATION:  GENERAL:critically ill appearing, HEAD: Normocephalic, atraumatic.  EYES: Pupils equal, round, reactive to light.  No scleral icterus.  MOUTH: Moist mucosal membrane. NECK: Supple. No thyromegaly. No nodules. No JVD.  PULMONARY: +rhonchi,  CARDIOVASCULAR: S1 and S2. Regular rate and rhythm. No murmurs, rubs, or gallops.  GASTROINTESTINAL: Soft, nontender, -distended. No masses. Positive bowel sounds. No hepatosplenomegaly.  MUSCULOSKELETAL: No swelling, clubbing, or edema.  NEUROLOGIC: lethargic, no focal deficits SKIN:intact,warm,dry  ASSESSMENT AND PLAN  67 year old gentleman with his of ESRD on HD, CVA, DM admitted from NH with pyrexia and found to have MRSA bacteremia with HCAP and severe resp failure s/p extubation    Severe Hypoxic and Hypercapnic Respiratory Failure-resolved Oxygen as needed   Renal Failure-ESRD on HD Follow up nephrology   Septic shock-MRSA bacteremia -use vasopressors to keep MAP>65 if needed -follow ABG and LA -continue IV abx as prescibed  ID -continue IV abx as prescibed -follow up cultures   DVT/GI PRX ordered TRANSFUSIONS AS NEEDED MONITOR FSBS ASSESS the need for LABS as needed  Remains SD status  Corrin Parker, M.D.  [facility]  Medical Director [place] Director [place] Department

## 2017-12-28 NOTE — Progress Notes (Signed)
[facility]  ROUNDING NOTE   Subjective:   Extubated.   Hemodialysis treatment yesterday. Tolerated treatment well. UF of 2557.   Hemodialysis for later today.   Objective:  Vital signs in last 24 hours:  Temp:  [98.1 F (36.7 C)-100 F (37.8 C)] 98.5 F (36.9 C) (06/27 0800) Pulse Rate:  [78-98] 83 (06/27 0800) Resp:  [15-35] 21 (06/27 0800) BP: (78-156)/(14-113) 118/58 (06/27 0800) SpO2:  [91 %-100 %] 97 % (06/27 0800) FiO2 (%):  [40 %] 40 % (06/26 1145) Weight:  [88.7 kg (195 lb 8.8 oz)] 88.7 kg (195 lb 8.8 oz) (06/27 0500)  Weight change: 3.4 kg (7 lb 7.9 oz) Filed Weights   12/27/17 0433 12/27/17 0810 12/28/17 0500  Weight: 95.5 kg (210 lb 8.6 oz) 98.9 kg (218 lb 0.6 oz) 88.7 kg (195 lb 8.8 oz)    Intake/Output: I/O last 3 completed shifts: In: 1264.7 [I.V.:74.7; NG/GT:920; IV Piggyback:270] Out: 2557 [Other:2557]   Intake/Output this shift:  No intake/output data recorded.  Physical Exam: General: Laying in bed  Head: Dry mucosal membranes  Eyes: Anicteric, PERRL  Neck: Supple, trachea midline  Lungs:  Clear bilaterally  Heart: regular  Abdomen:  Soft, nontender, obese  Extremities:  right BKA, no peripheral edema  Neurologic: Alert to self, lethargic  Skin: No lesions  Access: Left AVF    Basic Metabolic Panel: Recent Labs  Lab 12/24/17 1053 12/25/17 0407 12/26/17 0602 12/26/17 4097 12/27/17 0520 12/27/17 2311 12/28/17 0601  NA 132* 132*  --  129* 131*  --  135  K 3.7 3.7  --  4.2 3.8 3.8 4.0  CL 96* 97*  --  97* 99  --  97*  CO2 25 23  --  22 20*  --  26  GLUCOSE 113* 111*  --  127* 167*  --  135*  BUN 27* 36*  --  60* 81*  --  59*  CREATININE 5.30* 6.27*  --  7.99* 8.49*  --  7.08*  CALCIUM 8.6* 7.8*  --  7.8* 8.0*  --  8.5*  MG  --   --  2.0  --   --  2.3  --   PHOS  --  2.5  --  3.6  --  3.6  --     Liver Function Tests: Recent Labs  Lab 12/24/17 1053 12/25/17 0407 12/26/17 0838  AST 27  --   --   ALT 16*  --    --   ALKPHOS 67  --   --   BILITOT 0.9  --   --   PROT 7.6  --   --   ALBUMIN 3.9 2.9* 2.6*   No results for input(s): LIPASE, AMYLASE in the last 168 hours. Recent Labs  Lab 12/24/17 1057  AMMONIA 48*    CBC: Recent Labs  Lab 12/24/17 1053 12/25/17 0407 12/26/17 0838 12/27/17 0520  WBC 11.3* 8.9 12.3* 10.6  NEUTROABS 10.3*  --   --  8.9*  HGB 13.4 14.2 11.9* 11.5*  HCT 40.7 43.5 36.1* 33.5*  MCV 84.6 85.9 84.2 84.4  PLT 122* 93* 89* 89*    Cardiac Enzymes: No results for input(s): CKTOTAL, CKMB, CKMBINDEX, TROPONINI in the last 168 hours.  BNP: Invalid input(s): POCBNP  CBG: Recent Labs  Lab 12/27/17 1932 12/27/17 2345 12/28/17 0031 12/28/17 0322 12/28/17 0738  GLUCAP 112* 152* 131* 131* 125*    Microbiology: Results for orders placed or performed during the hospital encounter of 12/24/17  Blood culture (routine x 2)     Status: Abnormal   Collection Time: 12/24/17 10:54 AM  Result Value Ref Range Status   Specimen Description   Final    BLOOD RIGHT FATTY CASTS Performed at [facility], [address], [place], [place] [address]    Special Requests   Final    BOTTLES DRAWN AEROBIC AND ANAEROBIC Blood Culture adequate volume Performed at [facility], [address] [place], [place] [address]    Culture  Setup Time   Final    GRAM POSITIVE COCCI IN BOTH AEROBIC AND ANAEROBIC BOTTLES CRITICAL RESULT CALLED TO, READ BACK BY AND VERIFIED WITH: GARRETT COFFEE AT 2120 12/24/17.MSS/PMH Performed at [facility], [address] [place], [place] [address]    Culture METHICILLIN RESISTANT STAPHYLOCOCCUS AUREUS (A)  Final   Report Status 12/27/2017 FINAL  Final   Organism ID, Bacteria METHICILLIN RESISTANT STAPHYLOCOCCUS AUREUS  Final      Susceptibility   Methicillin resistant staphylococcus aureus - MIC*    CIPROFLOXACIN >=8 RESISTANT Resistant     ERYTHROMYCIN >=8 RESISTANT Resistant     GENTAMICIN <=0.5 SENSITIVE  Sensitive     OXACILLIN >=4 RESISTANT Resistant     TETRACYCLINE <=1 SENSITIVE Sensitive     VANCOMYCIN <=0.5 SENSITIVE Sensitive     TRIMETH/SULFA <=10 SENSITIVE Sensitive     CLINDAMYCIN <=0.25 SENSITIVE Sensitive     RIFAMPIN <=0.5 SENSITIVE Sensitive     Inducible Clindamycin NEGATIVE Sensitive     * METHICILLIN RESISTANT STAPHYLOCOCCUS AUREUS  Blood culture (routine x 2)     Status: Abnormal   Collection Time: 12/24/17 10:55 AM  Result Value Ref Range Status   Specimen Description   Final    BLOOD RIGHT ARM Performed at [facility], [address] [place], [place] [address]    Special Requests   Final    BOTTLES DRAWN AEROBIC AND ANAEROBIC Blood Culture adequate volume Performed at [facility], [address] [place], [place] [address]    Culture  Setup Time   Final    Organism ID to follow GRAM POSITIVE COCCI IN BOTH AEROBIC AND ANAEROBIC BOTTLES CRITICAL RESULT CALLED TO, READ BACK BY AND VERIFIED WITH: GARRETT COFFEY AT 2120 12/24/17. MSS Performed at [facility], [address] [place], [place] [address]    Culture (A)  Final    STAPHYLOCOCCUS AUREUS SUSCEPTIBILITIES PERFORMED ON PREVIOUS CULTURE WITHIN THE LAST 5 DAYS. Performed at [facility], [place] [address] [place], [place] [address]    Report Status 12/27/2017 FINAL  Final  Blood Culture ID Panel (Reflexed)     Status: Abnormal   Collection Time: 12/24/17 10:55 AM  Result Value Ref Range Status   Enterococcus species NOT DETECTED NOT DETECTED Final   Listeria monocytogenes NOT DETECTED NOT DETECTED Final   Staphylococcus species DETECTED (A) NOT DETECTED Final    Comment: CRITICAL RESULT CALLED TO, READ BACK BY AND VERIFIED WITH: GARRETT COFFEY AT 2120 12/24/17. MSS    Staphylococcus aureus DETECTED (A) NOT DETECTED Final    Comment: Methicillin (oxacillin)-resistant Staphylococcus aureus (MRSA). MRSA is predictably resistant to beta-lactam antibiotics (except  ceftaroline). Preferred therapy is vancomycin unless clinically contraindicated. Patient requires contact precautions if  hospitalized. CRITICAL RESULT CALLED TO, READ BACK BY AND VERIFIED WITH: GARRETT COFFEY AT 2120 12/24/17. MSS    Methicillin resistance DETECTED (A) NOT DETECTED Final    Comment: CRITICAL RESULT CALLED TO, READ BACK BY AND VERIFIED WITH: GARRETT  COFFEY AT 2120 12/24/17. MSS    Streptococcus species NOT DETECTED NOT DETECTED Final   Streptococcus agalactiae NOT DETECTED NOT DETECTED Final   Streptococcus pneumoniae NOT DETECTED NOT DETECTED Final   Streptococcus pyogenes NOT DETECTED NOT DETECTED Final   Acinetobacter baumannii NOT DETECTED NOT DETECTED Final   Enterobacteriaceae species NOT DETECTED NOT DETECTED Final   Enterobacter cloacae complex NOT DETECTED NOT DETECTED Final   Escherichia coli NOT DETECTED NOT DETECTED Final   Klebsiella oxytoca NOT DETECTED NOT DETECTED Final   Klebsiella pneumoniae NOT DETECTED NOT DETECTED Final   Proteus species NOT DETECTED NOT DETECTED Final   Serratia marcescens NOT DETECTED NOT DETECTED Final   Haemophilus influenzae NOT DETECTED NOT DETECTED Final   Neisseria meningitidis NOT DETECTED NOT DETECTED Final   Pseudomonas aeruginosa NOT DETECTED NOT DETECTED Final   Candida albicans NOT DETECTED NOT DETECTED Final   Candida glabrata NOT DETECTED NOT DETECTED Final   Candida krusei NOT DETECTED NOT DETECTED Final   Candida parapsilosis NOT DETECTED NOT DETECTED Final   Candida tropicalis NOT DETECTED NOT DETECTED Final    Comment: Performed at [facility], [address] [place], [place] [address]  MRSA PCR Screening     Status: None   Collection Time: 12/24/17  2:18 PM  Result Value Ref Range Status   MRSA by PCR NEGATIVE NEGATIVE Final    Comment:        The GeneXpert MRSA Assay (FDA approved for NASAL specimens only), is one component of a comprehensive MRSA colonization surveillance program. It is  not intended to diagnose MRSA infection nor to guide or monitor treatment for MRSA infections. Performed at [facility], [address] [place], [place] [address]   Culture, respiratory (NON-Expectorated)     Status: None   Collection Time: 12/24/17  3:08 PM  Result Value Ref Range Status   Specimen Description   Final    SPUTUM Performed at [facility], [address], [place], [place] [address]    Special Requests   Final    NONE Performed at [facility], [address] [place], [place] [address]    Gram Stain   Final    RARE WBC PRESENT, PREDOMINANTLY PMN NO SQUAMOUS EPITHELIAL CELLS SEEN MODERATE GRAM POSITIVE COCCI IN CLUSTERS    Culture   Final    Consistent with normal respiratory flora. Performed at [facility], [place] [address] [place], [place] [address]    Report Status 12/27/2017 FINAL  Final  Culture, blood (Routine X 2) w Reflex to ID Panel     Status: None (Preliminary result)   Collection Time: 12/25/17 10:03 AM  Result Value Ref Range Status   Specimen Description BLOOD BLOOD RIGHT HAND  Final   Special Requests   Final    BOTTLES DRAWN AEROBIC AND ANAEROBIC Blood Culture adequate volume   Culture   Final    NO GROWTH 3 DAYS Performed at [facility], [address], [place], [place] [address]    Report Status PENDING  Incomplete  Culture, blood (Routine X 2) w Reflex to ID Panel     Status: None (Preliminary result)   Collection Time: 12/25/17 10:25 AM  Result Value Ref Range Status   Specimen Description BLOOD BLOOD RIGHT HAND  Final   Special Requests   Final    BOTTLES DRAWN AEROBIC AND ANAEROBIC Blood Culture adequate volume   Culture   Final    NO GROWTH 3 DAYS  Performed at [facility], [address] [place], [place] [address]    Report Status PENDING  Incomplete    Coagulation Studies: No results for input(s): LABPROT, INR in the last 72 hours.  Urinalysis: No results for  input(s): COLORURINE, LABSPEC, PHURINE, GLUCOSEU, HGBUR, BILIRUBINUR, KETONESUR, PROTEINUR, UROBILINOGEN, NITRITE, LEUKOCYTESUR in the last 72 hours.  Invalid input(s): APPERANCEUR    Imaging: No results found.   Medications:   . vancomycin Stopped (12/26/17 1253)   . B-complex with vitamin C  1 tablet Per Tube QHS  . chlorhexidine gluconate (MEDLINE KIT)  15 mL Mouth Rinse BID  . Chlorhexidine Gluconate Cloth  6 each Topical Q0600  . famotidine  20 mg Per Tube Daily  . feeding supplement (NEPRO CARB STEADY)  1,000 mL Per Tube Q24H  . feeding supplement (PRO-STAT SUGAR FREE 64)  60 mL Per Tube BID  . heparin  5,000 Units Subcutaneous Q8H  . mouth rinse  15 mL Mouth Rinse 10 times per day   acetaminophen **OR** acetaminophen, albuterol, bisacodyl, diphenhydrAMINE, ibuprofen, midazolam, morphine injection, ondansetron **OR** ondansetron (ZOFRAN) IV, sennosides  Assessment/ Plan:  Dennis Walker is a 67 y.o. black male with end stage renal disease on Hemodialysis, diabetes mellitus type II, hypertension, peripheral vascular disease, coronary artery disease, hyperlipidemia, congestive heart failure, left BKA admitted on 08/05/2017 for chest pain. Currently intubated with acute respiratory failure  TTS [facility] left AVF EDW 90.5kg [facility] of [facility]  1. End Stage Renal Disease: on hemodialysis. TTS schedule. Hemodialysis treatment yesterday  Hemodialysis for later today. Orders prepared. Resume TTS schedule.  2. Hypotension with sepsis: secondary to pneumonia. Blood cultures from 6/23 growing MRSA Weaned off vasopressors - Holding bumetanide and lisinopril. - IV vancomycin   3. Anemia of chronic kidney disease: hemoglobin 11.5 - EPO as outpatient.   4. Secondary Hyperparathyroidism:  - Sevelamer with meals when tolerating PO   5. Diabetes mellitus type II with chronic kidney disease: insulin dependent.    LOS: 4 Connor Meacham 6/27/20198:52 AM

## 2017-12-28 NOTE — Progress Notes (Signed)
[facility] [place] NAME: Dennis Walker    MR#:  [id number]  DATE OF BIRTH:  1951-04-21  SUBJECTIVE:  CHIEF COMPLAINT:   Chief Complaint  Patient presents with  . Altered Mental Status   Came with AMS, Septic, intubated. Extubated. Have c/o pain in upper back.  REVIEW OF SYSTEMS:    Review of Systems  Constitutional: Negative for fever and malaise/fatigue.  HENT: Negative for hearing loss, sore throat and tinnitus.   Eyes: Negative for blurred vision and photophobia.  Respiratory: Negative for cough, sputum production and shortness of breath.   Cardiovascular: Negative for chest pain, palpitations and leg swelling.  Gastrointestinal: Negative for abdominal pain, nausea and vomiting.  Genitourinary: Negative for dysuria, frequency and urgency.  Skin: Negative for rash.  Neurological: Negative for tremors, sensory change and headaches.  Psychiatric/Behavioral: Negative for depression and substance abuse.    DRUG ALLERGIES:   Allergies  Allergen Reactions  . Cefuroxime Itching  . Septra Ds  [Sulfamethoxazole-Trimethoprim]     Other reaction(s): Itching  . Shrimp [Shellfish Allergy] Other (See Comments)    Per mar. Unknown reaction   . Sulfa Antibiotics Other (See Comments)    Per mar. Unknown reaction    VITALS:  Blood pressure 138/65, pulse 96, temperature 97.7 F (36.5 C), temperature source Oral, resp. rate 16, height 5\' 8"  (1.727 m), weight 88.7 kg (195 lb 8.8 oz), SpO2 100 %.  PHYSICAL EXAMINATION:  GENERAL:  67 y.o.-year-old patient lying in the bed with no acute distress.  EYES: Pupils equal, round, reactive to light and accommodation. No scleral icterus. Extraocular muscles intact.  HEENT: Head atraumatic, normocephalic. Oropharynx and nasopharynx clear. NECK:  Supple, no jugular venous distention. No thyroid enlargement, no tenderness.  LUNGS: Normal breath sounds bilaterally, no wheezing, rales,rhonchi or crepitation.  No use of accessory muscles of respiration.  CARDIOVASCULAR: S1, S2 normal. No murmurs, rubs, or gallops.  ABDOMEN: Soft, nontender, nondistended. Bowel sounds present. No organomegaly or mass.  EXTREMITIES: No pedal edema, cyanosis, or clubbing. Right side Bka- stump clean, non tender. Tender in thorasic vertebral area. NEUROLOGIC: moves all 4 limbs, no focal weakness. Sensations intact. PSYCHIATRIC: The patient is alert and oriented X 3.  SKIN: No obvious rash, lesion, or ulcer.   Physical Exam LABORATORY PANEL:   CBC Recent Labs  Lab 12/28/17 0602  WBC 10.1  HGB 11.4*  HCT 35.0*  PLT 90*   ------------------------------------------------------------------------------------------------------------------  Chemistries  Recent Labs  Lab 12/24/17 1053  12/27/17 2311 12/28/17 0601  NA 132*   < >  --  135  K 3.7   < > 3.8 4.0  CL 96*   < >  --  97*  CO2 25   < >  --  26  GLUCOSE 113*   < >  --  135*  BUN 27*   < >  --  59*  CREATININE 5.30*   < >  --  7.08*  CALCIUM 8.6*   < >  --  8.5*  MG  --    < > 2.3  --   AST 27  --   --   --   ALT 16*  --   --   --   ALKPHOS 67  --   --   --   BILITOT 0.9  --   --   --    < > = values in this interval not displayed.   ------------------------------------------------------------------------------------------------------------------  Cardiac Enzymes No  results for input(s): TROPONINI in the last 168 hours. ------------------------------------------------------------------------------------------------------------------  RADIOLOGY:  No results found.  ASSESSMENT AND PLAN:   Active Problems:   Acute on chronic respiratory failure with hypoxia (HCC)   1.Acute respiratory failure with hypoxia, due to pneumonia HCAP. Continue ventilation, continue , lung recruitment.  extubated 12/27/17  2. Altered Mental status -from sepsis  On sedation holiday, patient did follow commands.  completely alert and oriented now after  extubation.  3. MRSA bacteremia  May need evaluation for colonization of shunt? ID on case.  Cont ABX, schedule TTE - no vegetations. Pt have c/o upper back pain- get MRI thorasic vertebra.  4. Sepsis from MRSA bacteremia  All antihypertensive meds D/Ced.   BP stable now. Rising, may have to add some meds now.  5.Acute metabolic encephalopathy possible due to sepsis.  supportive care  6.ESRD on hemodialysis. Nephrology consult to continue hemodialysis.  management as per Nephrology team  7. Hx ofDiabetes.  Target glucose management. Dietitian consult    All the records are reviewed and case discussed with Care Management/Social Workerr. Management plans discussed with the patient, family and they are in agreement.  CODE STATUS: Full.  TOTAL TIME TAKING CARE OF THIS PATIENT: 35 minutes.    POSSIBLE D/C IN 1-2 DAYS, DEPENDING ON CLINICAL CONDITION.   Vaughan Basta M.D on 12/28/2017   Between 7am to 6pm - Pager - [phone]  After 6pm go to [email or web address] - password [email or web address] [facility]  Office  [phone]  CC: Primary care physician; Juluis Pitch, MD  Note: This dictation was prepared with Dragon dictation along with smaller phrase technology. Any transcriptional errors that result from this process are unintentional.

## 2017-12-28 NOTE — Progress Notes (Signed)
Nutrition Follow-up  DOCUMENTATION CODES:   Not applicable  INTERVENTION:  Plan discussed on rounds this morning was to start a diet today.  Once diet able to be advanced recommend Nepro Shake po TID, each supplement provides 425 kcal and 19 grams protein.  Discontinued tube feed orders.  Discontinued B-complex with C per tube daily.  Provide Rena-vite QHS.  NUTRITION DIAGNOSIS:   Inadequate oral intake related to inability to eat as evidenced by NPO status.  Ongoing.   GOAL:   Provide needs based on ASPEN/SCCM guidelines  Not met at this time.  MONITOR:   Vent status, Labs, Weight trends, TF tolerance, Skin, I & O's  REASON FOR ASSESSMENT:   Ventilator Enteral/tube feeding initiation and management  ASSESSMENT:   67 year old male with PMHx of DM type 2, HTN, HLD, hx CVA, PVD, hx right BKA in 2003, CHF, CAD, hx MI, asthma, depression, anxiety, arthritis, polio in childhood, ESRD on HD who was admitted from [facility] with AMS, acute respiratory failure with hypoxia due to PNA requiring mechanical intubation on 6/23.   -Patient found to have MRSA bacteremia. -Patient was extubated on 6/26.  Met with patient at bedside after he returned from HD. He kept asking for water during RD assessment and was unable to provide any history. Per chart he has been intermittently confused/lethargic.  Medications reviewed and include: famotidine, B-complex with C QHS, vancomycin.  Labs reviewed: CBG 108-152, Chloride 97, BUN 59, Creatinine 7.08.  I/O: no UOP recorded; 2557 mL removed from HD yesterday; 1050 mL removed from HD today  Weight trend: 88.7 kg on 6/27; +7.1 kg from admission  Discussed with RN and on rounds. Plan was to start a diet today.  Diet Order:   Diet Order           Diet NPO time specified  Diet effective now          EDUCATION NEEDS:   No education needs have been identified at this time  Skin:  Skin Assessment: Skin Integrity Issues:(s/p  right BKA)  Last BM:  12/27/2017 - large type 6  Height:   Ht Readings from Last 1 Encounters:  12/24/17 '5\' 8"'$  (1.727 m)    Weight:   Wt Readings from Last 1 Encounters:  12/28/17 195 lb 8.8 oz (88.7 kg)    Ideal Body Weight:  65.5 kg(adjusted for right BKA)  BMI:  Body mass index is 29.73 kg/m.  Estimated Nutritional Needs:   Kcal:  1610-9604 (MSJ x 1.2-1.4)  Protein:  95-115 grams (1.2-1.4 grams/kg)  Fluid:  UOP + 1 L  Willey Blade, MS, RD, LDN Office: [phone] Pager: [phone] After Hours/Weekend Pager: [phone]

## 2017-12-28 NOTE — Progress Notes (Signed)
Post HD Tx    12/28/17 1315  Vital Signs  Temp 98.4 F (36.9 C)  Temp Source Oral  Pulse Rate (!) 101  Pulse Rate Source Monitor  Resp 20  BP (!) 165/73  BP Location Right Arm  BP Method Automatic  Patient Position (if appropriate) Lying  Oxygen Therapy  SpO2 93 %  O2 Device Nasal Cannula  O2 Flow Rate (L/min) 4 L/min  Pulse Oximetry Type Continuous  During Hemodialysis Assessment  HD Safety Checks Performed Yes  Dialysis Fluid Bolus Normal Saline  Bolus Amount (mL) 250 mL  Intra-Hemodialysis Comments Tx completed;Tolerated well (TX shortened due to clotted system )  Post-Hemodialysis Assessment  Rinseback Volume (mL) 250 mL  Dialyzer Clearance Clotted  Duration of HD Treatment -hour(s) 2.5 hour(s) (Tx ended early for clotting, MD aware )  Hemodialysis Intake (mL) 500 mL  UF Total -Machine (mL) 1550 mL  Net UF (mL) 1050 mL  Tolerated HD Treatment Yes  AVG/AVF Arterial Site Held (minutes) 10 minutes  AVG/AVF Venous Site Held (minutes) 10 minutes

## 2017-12-28 NOTE — Progress Notes (Signed)
HD Tx started w/o complication    65/03/54 1030  Vital Signs  Pulse Rate 85  Pulse Rate Source Monitor  Resp 18  BP (!) 141/67  BP Location Right Arm  BP Method Automatic  Patient Position (if appropriate) Lying  Oxygen Therapy  SpO2 100 %  O2 Device Nasal Cannula  O2 Flow Rate (L/min) 4 L/min  Pulse Oximetry Type Continuous  During Hemodialysis Assessment  Blood Flow Rate (mL/min) 400 mL/min  Arterial Pressure (mmHg) -170 mmHg  Venous Pressure (mmHg) 220 mmHg  Transmembrane Pressure (mmHg) 50 mmHg  Ultrafiltration Rate (mL/min) 820 mL/min  Dialysate Flow Rate (mL/min) 600 ml/min  Conductivity: Machine  14.2  HD Safety Checks Performed Yes  Dialysis Fluid Bolus Normal Saline  Bolus Amount (mL) 250 mL  Intra-Hemodialysis Comments Tx initiated  Fistula / Graft Left Upper arm Arteriovenous fistula  Placement Date/Time: 12/24/17 1400   Placed prior to admission: Yes  Orientation: Left  Access Location: Upper arm  Access Type: Arteriovenous fistula  Status Accessed  Needle Size 15

## 2017-12-28 NOTE — Progress Notes (Signed)
Patient transported to room 224 via hospital bed on oxygen at 2 lpm.

## 2017-12-29 ENCOUNTER — Inpatient Hospital Stay: Payer: Medicare Other

## 2017-12-29 LAB — GLUCOSE, CAPILLARY
GLUCOSE-CAPILLARY: 128 mg/dL — AB (ref 70–99)
Glucose-Capillary: 120 mg/dL — ABNORMAL HIGH (ref 70–99)
Glucose-Capillary: 136 mg/dL — ABNORMAL HIGH (ref 70–99)

## 2017-12-29 MED ORDER — VANCOMYCIN HCL IN DEXTROSE 750-5 MG/150ML-% IV SOLN: 750.0000 mg | INTRAVENOUS | 0 refills | Status: AC

## 2017-12-29 MED ORDER — VANCOMYCIN HCL IN DEXTROSE 750-5 MG/150ML-% IV SOLN
750.0000 mg | INTRAVENOUS | Status: DC | PRN
  Filled 2017-12-29: qty 150

## 2017-12-29 MED ORDER — NEPRO/CARBSTEADY PO LIQD: 237.0000 mL | Freq: Three times a day (TID) | ORAL | 0 refills | Status: DC

## 2017-12-29 MED ORDER — GABAPENTIN 100 MG PO CAPS: 100.0000 mg | ORAL_CAPSULE | Freq: Every evening | ORAL | 0 refills | Status: AC

## 2017-12-29 MED ORDER — OXYCODONE HCL 5 MG PO TABS: 5.0000 mg | ORAL_TABLET | Freq: Three times a day (TID) | ORAL | 0 refills | Status: DC | PRN

## 2017-12-29 NOTE — Discharge Summary (Signed)
[facility] NAME: Dennis Walker    MR#:  [id number]  DATE OF BIRTH:  05-Mar-1951  DATE OF ADMISSION:  12/24/2017 ADMITTING PHYSICIAN: Demetrios Loll, MD  DATE OF DISCHARGE: 12/29/2017   PRIMARY CARE PHYSICIAN: Juluis Pitch, MD    ADMISSION DIAGNOSIS:  Fever, unspecified fever cause [R50.9] Altered mental status, unspecified altered mental status type [R41.82]  DISCHARGE DIAGNOSIS:  Active Problems:   Acute on chronic respiratory failure with hypoxia ([place])   bacteremia  SECONDARY DIAGNOSIS:   Past Medical History:  Diagnosis Date  . Anginal pain ([place])   . Anxiety   . Arthritis   . Asthma   . CHF (congestive heart failure) ([place])   . Coronary artery disease   . Depression   . Diabetes mellitus without complication ([place])   . Dyspnea   . ESRD (end stage renal disease) ([place])    On Tuesday, Thursday and Saturday dialysis  . Heart murmur   . Hyperlipidemia   . Hypertension   . Myocardial infarction ([place])   . Peripheral vascular disease ([place])   . Polio    childhood  . Stroke [facility])     HOSPITAL COURSE:   Acute on chronic respiratory failure with hypoxia ([place])   1.Acute respiratory failure with hypoxia, due to pneumonia HCAP. Continue ventilation, continue , lung recruitment.  extubated 12/27/17  2. Altered Mental status -from sepsis  On sedation holiday, patient did follow commands. completely alert and oriented now after extubation.  3.MRSA bacteremia  May need evaluation for colonization of shunt? ID on case.  ContABX, schedule TTE - no vegetations. Pt have c/o upper back pain- get MRI thorasic vertebra.   MRI negative for infections.   As per ID on phone- Will treat for total 6 weeks for MRSA, as can not r/o Endocarditis.  4.Sepsis from MRSA bacteremia All antihypertensive meds D/Ced.   BP stable now. Rising, may have to add some meds now.  5.Acute metabolic encephalopathy possible  due tosepsis.  supportive care  6.ESRD on hemodialysis. Nephrology consult to continue hemodialysis.  management as per Nephrology team   Will need Vancomycin with his HD.  7. Hx ofDiabetes.  Target glucose management.Dietitian consult    DISCHARGE CONDITIONS:   Stable.  CONSULTS OBTAINED:  Treatment Team:  Anthonette Legato, MD  DRUG ALLERGIES:   Allergies  Allergen Reactions  . Cefuroxime Itching  . Septra Ds  [Sulfamethoxazole-Trimethoprim]     Other reaction(s): Itching  . Shrimp [Shellfish Allergy] Other (See Comments)    Per mar. Unknown reaction   . Sulfa Antibiotics Other (See Comments)    Per mar. Unknown reaction    DISCHARGE MEDICATIONS:   Allergies as of 12/29/2017      Reactions   Cefuroxime Itching   Septra Ds  [sulfamethoxazole-trimethoprim]    Other reaction(s): Itching   Shrimp [shellfish Allergy] Other (See Comments)   Per mar. Unknown reaction   Sulfa Antibiotics Other (See Comments)   Per mar. Unknown reaction      Medication List    STOP taking these medications   bumetanide 2 MG tablet Commonly known as:  BUMEX   hydrALAZINE 25 MG tablet Commonly known as:  APRESOLINE   Insulin Detemir 100 UNIT/ML Pen Commonly known as:  LEVEMIR FLEXPEN   linagliptin 5 MG Tabs tablet Commonly known as:  TRADJENTA   MELATONIN PO   metoprolol succinate 25 MG 24 hr tablet Commonly known as:  TOPROL-XL   ranolazine 500  MG 12 hr tablet Commonly known as:  RANEXA   traMADol 50 MG tablet Commonly known as:  ULTRAM   traZODone 50 MG tablet Commonly known as:  DESYREL     TAKE these medications   acetaminophen 500 MG tablet Commonly known as:  TYLENOL Take 500 mg by mouth every 4 (four) hours as needed (pain).   aspirin EC 81 MG tablet Take 81 mg by mouth daily at 6 PM.   atorvastatin 20 MG tablet Commonly known as:  LIPITOR Take 40 mg by mouth daily at 6 PM.   b complex vitamins capsule Take 1 capsule by mouth daily at 6  PM.   clopidogrel 75 MG tablet Commonly known as:  PLAVIX Take 75 mg by mouth daily at 6 PM.   eucerin cream Apply 1 application topically 2 (two) times daily as needed (for pruritus).   feeding supplement (NEPRO CARB STEADY) Liqd Take 237 mLs by mouth 3 (three) times daily between meals.   ferrous sulfate 325 (65 FE) MG tablet Take 325 mg by mouth every evening.   gabapentin 100 MG capsule Commonly known as:  NEURONTIN Take 1 capsule (100 mg total) by mouth every evening. What changed:    how much to take  when to take this  Another medication with the same name was removed. Continue taking this medication, and follow the directions you see here.   lactulose 10 GM/15ML solution Commonly known as:  CHRONULAC Take 20 g by mouth at bedtime.   lidocaine-prilocaine cream Commonly known as:  EMLA Apply 1 application topically Every Tuesday,Thursday,and Saturday with dialysis.   lisinopril 5 MG tablet Commonly known as:  PRINIVIL,ZESTRIL Take 5 mg by mouth every evening.   nitroGLYCERIN 0.4 MG SL tablet Commonly known as:  NITROSTAT Place 0.4 mg under the tongue every 5 (five) minutes as needed for chest pain.   ORAJEL MAXIMUM STRENGTH 20 % Gel Generic drug:  benzocaine Use as directed 1 application in the mouth or throat 4 (four) times daily as needed (pain).   oxyCODONE 5 MG immediate release tablet Commonly known as:  Oxy IR/ROXICODONE Take 1 tablet (5 mg total) by mouth 3 (three) times daily as needed for severe pain. What changed:  when to take this   polyethylene glycol packet Commonly known as:  MIRALAX / GLYCOLAX Take 17 g by mouth daily at 6 PM.   RENVELA 800 MG tablet Generic drug:  sevelamer carbonate Take 1,600 mg by mouth 3 (three) times daily with meals. Or snacks   senna 8.6 MG Tabs tablet Commonly known as:  SENOKOT Take 8.6 mg by mouth 2 (two) times daily.   sertraline 100 MG tablet Commonly known as:  ZOLOFT Take 100 mg by mouth at  bedtime.   SYSTANE ULTRA PF 0.4-0.3 % Soln Generic drug:  Polyethyl Glyc-Propyl Glyc PF Place 1 drop into the left eye 4 (four) times daily.   Vancomycin 750-5 MG/150ML-% Soln Commonly known as:  VANCOCIN Inject 150 mLs (750 mg total) into the vein 3 (three) times a week. With HD.        DISCHARGE INSTRUCTIONS:    Follow with PMD and check Weekly CBC and vanc level.  If you experience worsening of your admission symptoms, develop shortness of breath, life threatening emergency, suicidal or homicidal thoughts you must seek medical attention immediately by calling 911 or calling your MD immediately  if symptoms less severe.  You Must read complete instructions/literature along with all the possible adverse reactions/side effects  for all the Medicines you take and that have been prescribed to you. Take any new Medicines after you have completely understood and accept all the possible adverse reactions/side effects.   Please note  You were cared for by a hospitalist during your hospital stay. If you have any questions about your discharge medications or the care you received while you were in the hospital after you are discharged, you can call the unit and asked to speak with the hospitalist on call if the hospitalist that took care of you is not available. Once you are discharged, your primary care physician will handle any further medical issues. Please note that NO REFILLS for any discharge medications will be authorized once you are discharged, as it is imperative that you return to your primary care physician (or establish a relationship with a primary care physician if you do not have one) for your aftercare needs so that they can reassess your need for medications and monitor your lab values.    Today   CHIEF COMPLAINT:   Chief Complaint  Patient presents with  . Altered Mental Status    HISTORY OF PRESENT ILLNESS:  Dennis Walker  is a 67 y.o. male with a known history of  multiple medical problems as below.  The patient was sent from nursing home due to altered mental status.  He was found high fever 103 and altered mental status.  He is on home oxygen.  He is intubated in the ED.  Chest x-ray and abdominal CAT scan show pneumonia.   VITAL SIGNS:  Blood pressure (!) 107/59, pulse 88, temperature 98 F (36.7 C), temperature source Oral, resp. rate 20, height 5\' 8"  (1.727 m), weight 91.3 kg (201 lb 3.2 oz), SpO2 100 %.  I/O:  No intake or output data in the 24 hours ending 12/29/17 1416  PHYSICAL EXAMINATION:  GENERAL:  67 y.o.-year-old patient lying in the bed with no acute distress.  EYES: Pupils equal, round, reactive to light and accommodation. No scleral icterus. Extraocular muscles intact.  HEENT: Head atraumatic, normocephalic. Oropharynx and nasopharynx clear. NECK:  Supple, no jugular venous distention. No thyroid enlargement, no tenderness.  LUNGS: Normal breath sounds bilaterally, no wheezing, rales,rhonchi or crepitation. No use of accessory muscles of respiration.  CARDIOVASCULAR: S1, S2 normal. No murmurs, rubs, or gallops.  ABDOMEN: Soft, nontender, nondistended. Bowel sounds present. No organomegaly or mass.  EXTREMITIES: No pedal edema, cyanosis, or clubbing. Right side Bka- stump clean, non tender. Tender in thorasic vertebral area. NEUROLOGIC: moves all 4 limbs, no focal weakness. Sensations intact. PSYCHIATRIC: The patient is alert and oriented X 3.  SKIN: No obvious rash, lesion, or ulcer.     DATA REVIEW:   CBC Recent Labs  Lab 12/28/17 0602  WBC 10.1  HGB 11.4*  HCT 35.0*  PLT 90*    Chemistries  Recent Labs  Lab 12/24/17 1053  12/27/17 2311 12/28/17 0601  NA 132*   < >  --  135  K 3.7   < > 3.8 4.0  CL 96*   < >  --  97*  CO2 25   < >  --  26  GLUCOSE 113*   < >  --  135*  BUN 27*   < >  --  59*  CREATININE 5.30*   < >  --  7.08*  CALCIUM 8.6*   < >  --  8.5*  MG  --    < > 2.3  --  AST 27  --   --   --   ALT  16*  --   --   --   ALKPHOS 67  --   --   --   BILITOT 0.9  --   --   --    < > = values in this interval not displayed.    Cardiac Enzymes No results for input(s): TROPONINI in the last 168 hours.  Microbiology Results  Results for orders placed or performed during the hospital encounter of 12/24/17  Blood culture (routine x 2)     Status: Abnormal   Collection Time: 12/24/17 10:54 AM  Result Value Ref Range Status   Specimen Description   Final    BLOOD RIGHT FATTY CASTS Performed at [facility], [address], [place], [place] [address]    Special Requests   Final    BOTTLES DRAWN AEROBIC AND ANAEROBIC Blood Culture adequate volume Performed at [facility], [address] [place], [place] [address]    Culture  Setup Time   Final    GRAM POSITIVE COCCI IN BOTH AEROBIC AND ANAEROBIC BOTTLES CRITICAL RESULT CALLED TO, READ BACK BY AND VERIFIED WITH: GARRETT COFFEE AT 2120 12/24/17.MSS/PMH Performed at [facility], [address] [place], [place] [address]    Culture METHICILLIN RESISTANT STAPHYLOCOCCUS AUREUS (A)  Final   Report Status 12/27/2017 FINAL  Final   Organism ID, Bacteria METHICILLIN RESISTANT STAPHYLOCOCCUS AUREUS  Final      Susceptibility   Methicillin resistant staphylococcus aureus - MIC*    CIPROFLOXACIN >=8 RESISTANT Resistant     ERYTHROMYCIN >=8 RESISTANT Resistant     GENTAMICIN <=0.5 SENSITIVE Sensitive     OXACILLIN >=4 RESISTANT Resistant     TETRACYCLINE <=1 SENSITIVE Sensitive     VANCOMYCIN <=0.5 SENSITIVE Sensitive     TRIMETH/SULFA <=10 SENSITIVE Sensitive     CLINDAMYCIN <=0.25 SENSITIVE Sensitive     RIFAMPIN <=0.5 SENSITIVE Sensitive     Inducible Clindamycin NEGATIVE Sensitive     * METHICILLIN RESISTANT STAPHYLOCOCCUS AUREUS  Blood culture (routine x 2)     Status: Abnormal   Collection Time: 12/24/17 10:55 AM  Result Value Ref Range Status   Specimen Description   Final    BLOOD RIGHT ARM Performed  at [facility], [address] [place], [place] [address]    Special Requests   Final    BOTTLES DRAWN AEROBIC AND ANAEROBIC Blood Culture adequate volume Performed at [facility], [address] [place], [place] [address]    Culture  Setup Time   Final    Organism ID to follow GRAM POSITIVE COCCI IN BOTH AEROBIC AND ANAEROBIC BOTTLES CRITICAL RESULT CALLED TO, READ BACK BY AND VERIFIED WITH: GARRETT COFFEY AT 2120 12/24/17. MSS Performed at [facility], [address] [place], [place] [address]    Culture (A)  Final    STAPHYLOCOCCUS AUREUS SUSCEPTIBILITIES PERFORMED ON PREVIOUS CULTURE WITHIN THE LAST 5 DAYS. Performed at [facility], [place] [address], [place], [place] [address]    Report Status 12/27/2017 FINAL  Final  Blood Culture ID Panel (Reflexed)     Status: Abnormal   Collection Time: 12/24/17 10:55 AM  Result Value Ref Range Status   Enterococcus species NOT DETECTED NOT DETECTED Final   Listeria monocytogenes NOT DETECTED NOT DETECTED Final   Staphylococcus species DETECTED (A) NOT DETECTED Final    Comment: CRITICAL RESULT CALLED TO, READ BACK BY AND VERIFIED  WITH: GARRETT COFFEY AT 2120 12/24/17. MSS    Staphylococcus aureus DETECTED (A) NOT DETECTED Final    Comment: Methicillin (oxacillin)-resistant Staphylococcus aureus (MRSA). MRSA is predictably resistant to beta-lactam antibiotics (except ceftaroline). Preferred therapy is vancomycin unless clinically contraindicated. Patient requires contact precautions if  hospitalized. CRITICAL RESULT CALLED TO, READ BACK BY AND VERIFIED WITH: GARRETT COFFEY AT 2120 12/24/17. MSS    Methicillin resistance DETECTED (A) NOT DETECTED Final    Comment: CRITICAL RESULT CALLED TO, READ BACK BY AND VERIFIED WITH: GARRETT COFFEY AT 2120 12/24/17. MSS    Streptococcus species NOT DETECTED NOT DETECTED Final   Streptococcus agalactiae NOT DETECTED NOT DETECTED Final   Streptococcus pneumoniae  NOT DETECTED NOT DETECTED Final   Streptococcus pyogenes NOT DETECTED NOT DETECTED Final   Acinetobacter baumannii NOT DETECTED NOT DETECTED Final   Enterobacteriaceae species NOT DETECTED NOT DETECTED Final   Enterobacter cloacae complex NOT DETECTED NOT DETECTED Final   Escherichia coli NOT DETECTED NOT DETECTED Final   Klebsiella oxytoca NOT DETECTED NOT DETECTED Final   Klebsiella pneumoniae NOT DETECTED NOT DETECTED Final   Proteus species NOT DETECTED NOT DETECTED Final   Serratia marcescens NOT DETECTED NOT DETECTED Final   Haemophilus influenzae NOT DETECTED NOT DETECTED Final   Neisseria meningitidis NOT DETECTED NOT DETECTED Final   Pseudomonas aeruginosa NOT DETECTED NOT DETECTED Final   Candida albicans NOT DETECTED NOT DETECTED Final   Candida glabrata NOT DETECTED NOT DETECTED Final   Candida krusei NOT DETECTED NOT DETECTED Final   Candida parapsilosis NOT DETECTED NOT DETECTED Final   Candida tropicalis NOT DETECTED NOT DETECTED Final    Comment: Performed at [facility], [address] [place], [place] [address]  MRSA PCR Screening     Status: None   Collection Time: 12/24/17  2:18 PM  Result Value Ref Range Status   MRSA by PCR NEGATIVE NEGATIVE Final    Comment:        The GeneXpert MRSA Assay (FDA approved for NASAL specimens only), is one component of a comprehensive MRSA colonization surveillance program. It is not intended to diagnose MRSA infection nor to guide or monitor treatment for MRSA infections. Performed at [facility], [address] [place], [place] [address]   Culture, respiratory (NON-Expectorated)     Status: None   Collection Time: 12/24/17  3:08 PM  Result Value Ref Range Status   Specimen Description   Final    SPUTUM Performed at [facility], [address] [place], [place] [address]    Special Requests   Final    NONE Performed at [facility], [address] [place], [place]  [address]    Gram Stain   Final    RARE WBC PRESENT, PREDOMINANTLY PMN NO SQUAMOUS EPITHELIAL CELLS SEEN MODERATE GRAM POSITIVE COCCI IN CLUSTERS    Culture   Final    Consistent with normal respiratory flora. Performed at [facility], [place] [address] [place], [place] [address]    Report Status 12/27/2017 FINAL  Final  Culture, blood (Routine X 2) w Reflex to ID Panel     Status: None (Preliminary result)   Collection Time: 12/25/17 10:03 AM  Result Value Ref Range Status   Specimen Description BLOOD BLOOD RIGHT HAND  Final   Special Requests   Final    BOTTLES DRAWN AEROBIC AND ANAEROBIC Blood Culture adequate volume   Culture   Final    NO GROWTH 4 DAYS Performed at [facility], [address] [place], [place] [address]    Report Status PENDING  Incomplete  Culture, blood (Routine X 2) w Reflex to ID Panel     Status: None (Preliminary result)   Collection Time: 12/25/17 10:25 AM  Result Value Ref Range Status   Specimen Description BLOOD BLOOD RIGHT HAND  Final   Special Requests   Final    BOTTLES DRAWN AEROBIC AND ANAEROBIC Blood Culture adequate volume   Culture   Final    NO GROWTH 4 DAYS Performed at [facility], [address] [place], [place] [address]    Report Status PENDING  Incomplete    RADIOLOGY:  Mr Thoracic Spine Wo Contrast  Result Date: 12/29/2017 CLINICAL DATA:  Mid back pain. Altered mental status. Septic. Dialysis patient. EXAM: MRI THORACIC SPINE WITHOUT CONTRAST TECHNIQUE: Multiplanar, multisequence MR imaging of the thoracic spine was performed. No intravenous contrast was administered. COMPARISON:  None. FINDINGS: Alignment:  Normal alignment.  Mild dextroscoliosis. Vertebrae: Negative for fracture or mass. Schmorl's node anteriorly at T10-11. Cord:  Negative for cord compression.  Cord signal is normal. Paraspinal and other soft tissues: No paraspinous mass or soft tissue thickening Disc levels: No evidence of  discitis/osteomyelitis. Disc degeneration and mild disc bulging T10-11 with anterior Schmorl's node. Bilateral facet hypertrophy causing mild spinal stenosis. Mild foraminal narrowing bilaterally due to bony overgrowth. No other significant degenerative change in the thoracic spine. Negative for disc protrusion or neural impingement IMPRESSION: Negative for spinal infection Disc and facet degeneration causing mild spinal and foraminal stenosis at T10-11. Electronically Signed   By: Franchot Gallo M.D.   On: 12/29/2017 12:51    EKG:   Orders placed or performed during the hospital encounter of 12/24/17  . ED EKG  . ED EKG  . EKG 12-Lead  . EKG 12-Lead      Management plans discussed with the patient, family and they are in agreement.  CODE STATUS: full.    Code Status Orders  (From admission, onward)        Start     Ordered   12/24/17 1400  Full code  Continuous     12/24/17 1359    Code Status History    Date Active Date Inactive Code Status Order ID Comments User Context   08/05/2017 1756 08/06/2017 1829 Full Code [id number]  Bettey Costa, MD Inpatient   02/02/2017 1830 02/05/2017 1825 Full Code [id number]  Henreitta Leber, MD Inpatient    Advance Directive Documentation     Most Recent Value  Type of Advance Directive  Healthcare Power of Attorney  Pre-existing out of facility DNR order (yellow form or pink MOST form)  -  "MOST" Form in Place?  -      TOTAL TIME TAKING CARE OF THIS PATIENT: 35 minutes.    Vaughan Basta M.D on 12/29/2017 at 2:16 PM  Between 7am to 6pm - Pager - [phone]  After 6pm go to [email or web address] - password [email or web address] [facility]  Office  [phone]  CC: Primary care physician; Juluis Pitch, MD   Note: This dictation was prepared with Dragon dictation along with smaller phrase technology. Any transcriptional errors that result from this process are unintentional.

## 2017-12-29 NOTE — Progress Notes (Addendum)
[facility]  ROUNDING NOTE   Subjective:   Patient reports that he feel "ok" today.  He states that hemodialysis went well yesterday.  He denies any edema or shortness of breath.   Objective:  Vital signs in last 24 hours:  Temp:  [97.7 F (36.5 C)-98.4 F (36.9 C)] 98 F (36.7 C) (06/28 0407) Pulse Rate:  [88-101] 88 (06/28 0407) Resp:  [16-20] 20 (06/28 0407) BP: (107-168)/(57-79) 107/59 (06/28 0407) SpO2:  [99 %-100 %] 100 % (06/28 0407) Weight:  [91.3 kg (201 lb 3.2 oz)] 91.3 kg (201 lb 3.2 oz) (06/28 0407)  Weight change: -7.636 kg (-16 lb 13.4 oz) Filed Weights   12/27/17 0810 12/28/17 0500 12/29/17 0407  Weight: 98.9 kg (218 lb 0.6 oz) 88.7 kg (195 lb 8.8 oz) 91.3 kg (201 lb 3.2 oz)    Intake/Output: I/O last 3 completed shifts: In: -  Out: 1050 [Other:1050]   Intake/Output this shift:  No intake/output data recorded.  Physical Exam: General: NAD, resting comfortably in bed.   Head: Normocephalic, atraumatic.   Eyes: Anicteric, PERRL  Neck: Supple, trachea midline  Lungs:  Clear to auscultation on RA   Heart: Regular rate and rhythm  Abdomen:  Soft, nontender,   Extremities:  No peripheral edema.  Neurologic: Nonfocal, moving all four extremities  Skin: No lesions  Access: Left AVF     Basic Metabolic Panel: Recent Labs  Lab 12/24/17 1053 12/25/17 0407 12/26/17 0602 12/26/17 7616 12/27/17 0520 12/27/17 2311 12/28/17 0601  NA 132* 132*  --  129* 131*  --  135  K 3.7 3.7  --  4.2 3.8 3.8 4.0  CL 96* 97*  --  97* 99  --  97*  CO2 25 23  --  22 20*  --  26  GLUCOSE 113* 111*  --  127* 167*  --  135*  BUN 27* 36*  --  60* 81*  --  59*  CREATININE 5.30* 6.27*  --  7.99* 8.49*  --  7.08*  CALCIUM 8.6* 7.8*  --  7.8* 8.0*  --  8.5*  MG  --   --  2.0  --   --  2.3  --   PHOS  --  2.5  --  3.6  --  3.6  --     Liver Function Tests: Recent Labs  Lab 12/24/17 1053 12/25/17 0407 12/26/17 0838  AST 27  --   --   ALT 16*  --   --    ALKPHOS 67  --   --   BILITOT 0.9  --   --   PROT 7.6  --   --   ALBUMIN 3.9 2.9* 2.6*   No results for input(s): LIPASE, AMYLASE in the last 168 hours. Recent Labs  Lab 12/24/17 1057  AMMONIA 48*    CBC: Recent Labs  Lab 12/24/17 1053 12/25/17 0407 12/26/17 0838 12/27/17 0520 12/28/17 0602  WBC 11.3* 8.9 12.3* 10.6 10.1  NEUTROABS 10.3*  --   --  8.9*  --   HGB 13.4 14.2 11.9* 11.5* 11.4*  HCT 40.7 43.5 36.1* 33.5* 35.0*  MCV 84.6 85.9 84.2 84.4 84.8  PLT 122* 93* 89* 89* 90*    Cardiac Enzymes: No results for input(s): CKTOTAL, CKMB, CKMBINDEX, TROPONINI in the last 168 hours.  BNP: Invalid input(s): POCBNP  CBG: Recent Labs  Lab 12/28/17 1701 12/28/17 2116 12/29/17 0010 12/29/17 0410 12/29/17 0746  GLUCAP 108* 170* 120* 136* 128*  Microbiology: Results for orders placed or performed during the hospital encounter of 12/24/17  Blood culture (routine x 2)     Status: Abnormal   Collection Time: 12/24/17 10:54 AM  Result Value Ref Range Status   Specimen Description   Final    BLOOD RIGHT FATTY CASTS Performed at [facility], [address] [place], [place] [address]    Special Requests   Final    BOTTLES DRAWN AEROBIC AND ANAEROBIC Blood Culture adequate volume Performed at [facility], [address] [place], [place] [address]    Culture  Setup Time   Final    GRAM POSITIVE COCCI IN BOTH AEROBIC AND ANAEROBIC BOTTLES CRITICAL RESULT CALLED TO, READ BACK BY AND VERIFIED WITH: GARRETT COFFEE AT 2120 12/24/17.MSS/PMH Performed at [facility], [address] [place], [place] [address]    Culture METHICILLIN RESISTANT STAPHYLOCOCCUS AUREUS (A)  Final   Report Status 12/27/2017 FINAL  Final   Organism ID, Bacteria METHICILLIN RESISTANT STAPHYLOCOCCUS AUREUS  Final      Susceptibility   Methicillin resistant staphylococcus aureus - MIC*    CIPROFLOXACIN >=8 RESISTANT Resistant     ERYTHROMYCIN >=8 RESISTANT  Resistant     GENTAMICIN <=0.5 SENSITIVE Sensitive     OXACILLIN >=4 RESISTANT Resistant     TETRACYCLINE <=1 SENSITIVE Sensitive     VANCOMYCIN <=0.5 SENSITIVE Sensitive     TRIMETH/SULFA <=10 SENSITIVE Sensitive     CLINDAMYCIN <=0.25 SENSITIVE Sensitive     RIFAMPIN <=0.5 SENSITIVE Sensitive     Inducible Clindamycin NEGATIVE Sensitive     * METHICILLIN RESISTANT STAPHYLOCOCCUS AUREUS  Blood culture (routine x 2)     Status: Abnormal   Collection Time: 12/24/17 10:55 AM  Result Value Ref Range Status   Specimen Description   Final    BLOOD RIGHT ARM Performed at [facility], [address], [place], [place] [address]    Special Requests   Final    BOTTLES DRAWN AEROBIC AND ANAEROBIC Blood Culture adequate volume Performed at [facility], [address] [place], [place] [address]    Culture  Setup Time   Final    Organism ID to follow GRAM POSITIVE COCCI IN BOTH AEROBIC AND ANAEROBIC BOTTLES CRITICAL RESULT CALLED TO, READ BACK BY AND VERIFIED WITH: GARRETT COFFEY AT 2120 12/24/17. MSS Performed at [facility], [address] [place], [place] [address]    Culture (A)  Final    STAPHYLOCOCCUS AUREUS SUSCEPTIBILITIES PERFORMED ON PREVIOUS CULTURE WITHIN THE LAST 5 DAYS. Performed at [facility], [place] [address], [place], [place] [address]    Report Status 12/27/2017 FINAL  Final  Blood Culture ID Panel (Reflexed)     Status: Abnormal   Collection Time: 12/24/17 10:55 AM  Result Value Ref Range Status   Enterococcus species NOT DETECTED NOT DETECTED Final   Listeria monocytogenes NOT DETECTED NOT DETECTED Final   Staphylococcus species DETECTED (A) NOT DETECTED Final    Comment: CRITICAL RESULT CALLED TO, READ BACK BY AND VERIFIED WITH: GARRETT COFFEY AT 2120 12/24/17. MSS    Staphylococcus aureus DETECTED (A) NOT DETECTED Final    Comment: Methicillin (oxacillin)-resistant Staphylococcus aureus (MRSA). MRSA is predictably resistant to  beta-lactam antibiotics (except ceftaroline). Preferred therapy is vancomycin unless clinically contraindicated. Patient requires contact precautions if  hospitalized. CRITICAL RESULT CALLED TO, READ BACK BY AND VERIFIED WITH: GARRETT COFFEY AT 2120 12/24/17. MSS    Methicillin resistance DETECTED (A) NOT DETECTED Final  Comment: CRITICAL RESULT CALLED TO, READ BACK BY AND VERIFIED WITH: GARRETT COFFEY AT 2120 12/24/17. MSS    Streptococcus species NOT DETECTED NOT DETECTED Final   Streptococcus agalactiae NOT DETECTED NOT DETECTED Final   Streptococcus pneumoniae NOT DETECTED NOT DETECTED Final   Streptococcus pyogenes NOT DETECTED NOT DETECTED Final   Acinetobacter baumannii NOT DETECTED NOT DETECTED Final   Enterobacteriaceae species NOT DETECTED NOT DETECTED Final   Enterobacter cloacae complex NOT DETECTED NOT DETECTED Final   Escherichia coli NOT DETECTED NOT DETECTED Final   Klebsiella oxytoca NOT DETECTED NOT DETECTED Final   Klebsiella pneumoniae NOT DETECTED NOT DETECTED Final   Proteus species NOT DETECTED NOT DETECTED Final   Serratia marcescens NOT DETECTED NOT DETECTED Final   Haemophilus influenzae NOT DETECTED NOT DETECTED Final   Neisseria meningitidis NOT DETECTED NOT DETECTED Final   Pseudomonas aeruginosa NOT DETECTED NOT DETECTED Final   Candida albicans NOT DETECTED NOT DETECTED Final   Candida glabrata NOT DETECTED NOT DETECTED Final   Candida krusei NOT DETECTED NOT DETECTED Final   Candida parapsilosis NOT DETECTED NOT DETECTED Final   Candida tropicalis NOT DETECTED NOT DETECTED Final    Comment: Performed at [facility], [address] [place], [place] [address]  MRSA PCR Screening     Status: None   Collection Time: 12/24/17  2:18 PM  Result Value Ref Range Status   MRSA by PCR NEGATIVE NEGATIVE Final    Comment:        The GeneXpert MRSA Assay (FDA approved for NASAL specimens only), is one component of a comprehensive MRSA  colonization surveillance program. It is not intended to diagnose MRSA infection nor to guide or monitor treatment for MRSA infections. Performed at [facility], [address] [place], [place] [address]   Culture, respiratory (NON-Expectorated)     Status: None   Collection Time: 12/24/17  3:08 PM  Result Value Ref Range Status   Specimen Description   Final    SPUTUM Performed at [facility], [address] [place], [place] [address]    Special Requests   Final    NONE Performed at [facility], [address] [place], [place] [address]    Gram Stain   Final    RARE WBC PRESENT, PREDOMINANTLY PMN NO SQUAMOUS EPITHELIAL CELLS SEEN MODERATE GRAM POSITIVE COCCI IN CLUSTERS    Culture   Final    Consistent with normal respiratory flora. Performed at [facility], [place] [address] [place], [place] [address]    Report Status 12/27/2017 FINAL  Final  Culture, blood (Routine X 2) w Reflex to ID Panel     Status: None (Preliminary result)   Collection Time: 12/25/17 10:03 AM  Result Value Ref Range Status   Specimen Description BLOOD BLOOD RIGHT HAND  Final   Special Requests   Final    BOTTLES DRAWN AEROBIC AND ANAEROBIC Blood Culture adequate volume   Culture   Final    NO GROWTH 4 DAYS Performed at [facility], [address] [place], [place] [address]    Report Status PENDING  Incomplete  Culture, blood (Routine X 2) w Reflex to ID Panel     Status: None (Preliminary result)   Collection Time: 12/25/17 10:25 AM  Result Value Ref Range Status   Specimen Description BLOOD BLOOD RIGHT HAND  Final   Special Requests   Final    BOTTLES DRAWN AEROBIC AND ANAEROBIC Blood Culture adequate volume  Culture   Final    NO GROWTH 4 DAYS Performed at [facility], [address] [place], [place] [address]    Report Status PENDING  Incomplete    Coagulation Studies: No results for input(s): LABPROT, INR in the last 72  hours.  Urinalysis: No results for input(s): COLORURINE, LABSPEC, PHURINE, GLUCOSEU, HGBUR, BILIRUBINUR, KETONESUR, PROTEINUR, UROBILINOGEN, NITRITE, LEUKOCYTESUR in the last 72 hours.  Invalid input(s): APPERANCEUR    Imaging: Mr Thoracic Spine Wo Contrast  Result Date: 12/29/2017 CLINICAL DATA:  Mid back pain. Altered mental status. Septic. Dialysis patient. EXAM: MRI THORACIC SPINE WITHOUT CONTRAST TECHNIQUE: Multiplanar, multisequence MR imaging of the thoracic spine was performed. No intravenous contrast was administered. COMPARISON:  None. FINDINGS: Alignment:  Normal alignment.  Mild dextroscoliosis. Vertebrae: Negative for fracture or mass. Schmorl's node anteriorly at T10-11. Cord:  Negative for cord compression.  Cord signal is normal. Paraspinal and other soft tissues: No paraspinous mass or soft tissue thickening Disc levels: No evidence of discitis/osteomyelitis. Disc degeneration and mild disc bulging T10-11 with anterior Schmorl's node. Bilateral facet hypertrophy causing mild spinal stenosis. Mild foraminal narrowing bilaterally due to bony overgrowth. No other significant degenerative change in the thoracic spine. Negative for disc protrusion or neural impingement IMPRESSION: Negative for spinal infection Disc and facet degeneration causing mild spinal and foraminal stenosis at T10-11. Electronically Signed   By: Franchot Gallo M.D.   On: 12/29/2017 12:51     Medications:   . [START ON 12/30/2017] vancomycin     . chlorhexidine gluconate (MEDLINE KIT)  15 mL Mouth Rinse BID  . Chlorhexidine Gluconate Cloth  6 each Topical Q0600  . famotidine  20 mg Per Tube Daily  . feeding supplement (NEPRO CARB STEADY)  237 mL Oral TID BM  . heparin  5,000 Units Subcutaneous Q8H  . mouth rinse  15 mL Mouth Rinse 10 times per day  . multivitamin  1 tablet Oral QHS   acetaminophen **OR** acetaminophen, albuterol, bisacodyl, diphenhydrAMINE, hydrALAZINE, ibuprofen, midazolam, morphine  injection, ondansetron **OR** ondansetron (ZOFRAN) IV, sennosides, [START ON 12/30/2017] vancomycin  Assessment/ Plan:  [name] is a 67 y.o.  male  with end stage renal disease onHemodialysis, diabetes mellitus type II, hypertension, peripheral vascular disease, coronary artery disease, hyperlipidemia, congestive heart failure, left BKA admitted on 6/23/2019for AMS with respiratory failure.   TTS [facility] left AVF EDW 90.5kg Resident of [facility]  1. End Stage Renal Disease: on hemodialysis. TTS schedule. Hemodialysis treatment yesterday  - Continue TTS schedule outpatient  - IV vanc with treatments for 6 weeks.   2. Hypotension with sepsis: secondary to pneumonia. Blood cultures from 6/23 growing MRSA Weaned off vasopressors - Currently off of all blood pressure medication.    3. Anemia of chronic kidney disease: hemoglobin 11.5 - EPO as outpatient.   4. Secondary Hyperparathyroidism:  - Sevelamer with meals   5. Diabetes mellitus type II with chronic kidney disease: insulin dependent.    LOS: [place] 6/28/20193:16 PM    Patient seen and examined with physician assistant. Agree with above plan.   [place] 6/28/20194:58 PM

## 2017-12-29 NOTE — Care Management (Signed)
Amanda Morris dialysis liaison notified of discharge  

## 2017-12-29 NOTE — Care Management Important Message (Signed)
Important Message  Patient Details  Name: Dennis Walker MRN: [id number] Date of Birth: 06-Jul-1950   Medicare Important Message Given:  Yes    Beverly Sessions, RN 12/29/2017, 2:27 PM

## 2017-12-29 NOTE — Clinical Social Work Note (Signed)
Patient is from [facility] as a long term care resident, and can return once he is medically ready for discharge.  CSW continuing to follow to facilitate discharge planning.  Jones Broom. [place], MSW, [place]

## 2017-12-29 NOTE — Clinical Social Work Note (Signed)
Clinical Social Work Assessment  Patient Details  Name: Dennis Walker MRN: [id number] Date of Birth: Dec 23, 1950  Date of referral:  12/29/17               Reason for consult:  Facility Placement                Permission sought to share information with:  Case Manager, Customer service manager, Family Supports Permission granted to share information::  Yes, Verbal Permission Granted  Name::        Agency::     Relationship::     Contact Information:     Housing/Transportation Living arrangements for the past 2 months:  [place] of Information:  Patient Patient Interpreter Needed:  None Criminal Activity/Legal Involvement Pertinent to Current Situation/Hospitalization:  No - Comment as needed Significant Relationships:  Adult Children Lives with:  Facility Resident Do you feel safe going back to the place where you live?  Yes Need for family participation in patient care:  Yes (Comment)  Care giving concerns:  Patient is a long term resident at [facility]    Social Worker assessment / plan:  CSW met with patient to discuss discharge. Patient is alert but confused. He states that he lives at [facility]. CSW contacted Tammy at [facility] and she confirmed that patient is a long term resident and can return when ready for discharge. CSW attempted twice to contact patient's daughter Theodoro Grist [phone] with no success. CSW will follow for discharge planning   Employment status:  Retired Forensic scientist:  Medicare PT Recommendations:  Not assessed at this time Information / Referral to community resources:     Patient/Family's Response to care:  Patient thanked CSW for assistance   Patient/Family's Understanding of and Emotional Response to Diagnosis, Current Treatment, and Prognosis:   Unable to reach family   Emotional Assessment Appearance:  Appears stated age Attitude/Demeanor/Rapport:    Affect (typically observed):   Pleasant, Quiet Orientation:  Oriented to Self, Oriented to Place Alcohol / Substance use:  Not Applicable Psych involvement (Current and /or in the community):  No (Comment)  Discharge Needs  Concerns to be addressed:    Readmission within the last 30 days:  No Current discharge risk:  None Barriers to Discharge:  No Barriers Identified   Sissy Hoff 12/29/2017, 2:38 PM

## 2017-12-29 NOTE — Clinical Social Work Note (Signed)
Patient is medically ready for discharge today back to [facility]. CSW notified patient of discharge. CSW called daughter Theodoro Grist [phone] three times. Daughter did not answer and has not returned call. CSW left voicemail for [facility] stating patient will discharge back to [facility] today. CSW also tried to contact patient's other daughter Jules Husbands [phone] with no success. CSW notified Tammy at [facility] that patient will discharge today. Patient will be transported by EMS. RN to call report and call for transport.   [place], [place]

## 2017-12-29 NOTE — NC FL2 (Signed)
[place] LEVEL OF CARE SCREENING TOOL     IDENTIFICATION  Patient Name: Dennis Walker Birthdate: 1951/01/24 Sex: male Admission Date (Current Location): 12/24/2017  [place] and [place] Number:  Engineering geologist and Address:  [facility], [address], [place], [place] [address]      Provider Number: [id number]  Attending Physician Name and Address:  Vaughan Basta, *  Relative Name and Phone Number:  Theodoro Grist- daughter [phone]    Current Level of Care: Hospital Recommended Level of Care: [place] Prior Approval Number:    Date Approved/Denied:   PASRR Number:    Discharge Plan: SNF    Current Diagnoses: Patient Active Problem List   Diagnosis Date Noted  . Acute on chronic respiratory failure with hypoxia ([place]) 12/24/2017  . Elevated troponin 08/05/2017  . 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 Complication from renal dialysis device 02/20/2017  . End stage renal disease ([place]) 02/12/2017  . Altered mental status 02/02/2017  . Atherosclerosis of native arteries of extremity with intermittent claudication ([place]) 12/20/2016  . 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 Diabetes ([place]) 12/20/2016  . Essential hypertension 12/20/2016  . Pure hypercholesterolemia 12/20/2016    Orientation RESPIRATION BLADDER Height & Weight     Self, Place  O2(2 liters) Continent Weight: 201 lb 3.2 oz (91.3 kg) Height:  '5\' 8"'$  (172.7 cm)  BEHAVIORAL SYMPTOMS/MOOD NEUROLOGICAL BOWEL NUTRITION STATUS  (none) (none) Incontinent Diet(Carb Modified with 1200 ml fluid restriction)  AMBULATORY STATUS COMMUNICATION OF NEEDS Skin   Extensive Assist Verbally Normal                       Personal Care Assistance Level of Assistance  Bathing, Feeding, Dressing Bathing Assistance: Limited assistance Feeding assistance: Limited assistance Dressing Assistance: Limited assistance      Functional Limitations Info  Sight, Hearing, Speech Sight Info: Adequate Hearing Info: Adequate Speech Info: Adequate    SPECIAL CARE FACTORS FREQUENCY  PT (By licensed PT), OT (By licensed OT)                    Contractures Contractures Info: Not present    Additional Factors Info  Code Status, Allergies Code Status Info: Full Code  Allergies Info: Cefuroxime, Septra Ds, Sulfa Antibiotics, Shrimp           Current Medications (12/29/2017):  This is the current hospital active medication list Current Facility-Administered Medications  Medication Dose Route Frequency Provider Last Rate Last Dose  . acetaminophen (TYLENOL) tablet 650 mg  650 mg Oral Q6H PRN Awilda Bill, NP       Or  . acetaminophen (TYLENOL) suppository 650 mg  650 mg Rectal Q6H PRN Awilda Bill, NP   650 mg at 12/27/17 2249  . albuterol (PROVENTIL) (2.5 MG/3ML) 0.083% nebulizer solution 2.5 mg  2.5 mg Nebulization Q2H PRN Demetrios Loll, MD      . bisacodyl (DULCOLAX) suppository 10 mg  10 mg Rectal Daily PRN Tukov-Yual, Magdalene S, NP      . chlorhexidine gluconate (MEDLINE KIT) (PERIDEX) 0.12 % solution 15 mL  15 mL Mouth Rinse BID Lafayette Dragon, MD   15 mL at 12/29/17 0950  . Chlorhexidine Gluconate Cloth 2 % PADS 6 each  6 each Topical Q0600 Lavonia Dana, MD   6 each at 12/29/17 0951  . diphenhydrAMINE (BENADRYL) injection 12.5 mg  12.5 mg Intravenous Q6H PRN  Anders Simmonds, MD   12.5 mg at 12/26/17 0404  . famotidine (PEPCID) tablet 20 mg  20 mg Per Tube Daily Vaughan Basta, MD   20 mg at 12/29/17 0949  . feeding supplement (NEPRO CARB STEADY) liquid 237 mL  237 mL Oral TID BM Vaughan Basta, MD      . heparin injection 5,000 Units  5,000 Units Subcutaneous Q8H Demetrios Loll, MD   5,000 Units at 12/29/17 0545  . hydrALAZINE (APRESOLINE) injection 10 mg  10 mg Intravenous Q6H PRN Vaughan Basta, MD   10 mg at 12/28/17 1801  . ibuprofen (ADVIL,MOTRIN) tablet 400 mg   400 mg Oral Q4H PRN Lafayette Dragon, MD   400 mg at 12/26/17 1656  . MEDLINE mouth rinse  15 mL Mouth Rinse 10 times per day Lafayette Dragon, MD   15 mL at 12/29/17 0950  . midazolam (VERSED) injection 1 mg  1 mg Intravenous Q2H PRN Tukov-Yual, Magdalene S, NP   1 mg at 12/27/17 0612  . morphine 2 MG/ML injection 2 mg  2 mg Intravenous Q3H PRN Flora Lipps, MD   2 mg at 12/28/17 1804  . multivitamin (RENA-VIT) tablet 1 tablet  1 tablet Oral QHS Flora Lipps, MD   1 tablet at 12/28/17 2200  . ondansetron (ZOFRAN) tablet 4 mg  4 mg Oral Q6H PRN Demetrios Loll, MD       Or  . ondansetron [facility]) injection 4 mg  4 mg Intravenous Q6H PRN Demetrios Loll, MD      . sennosides (SENOKOT) 8.8 MG/5ML syrup 5 mL  5 mL Per Tube BID PRN Tukov-Yual, Arlyss Gandy, NP      . [START ON 12/30/2017] vancomycin (VANCOCIN) IVPB 750 mg/150 ml premix  750 mg Intravenous PRN Rocky Morel, [facility]         Discharge Medications: Please see discharge summary for a list of discharge medications.  Relevant Imaging Results:  Relevant Lab Results:   Additional Information    Latara Micheli  Louretta Shorten, LCSWA

## 2017-12-29 NOTE — Progress Notes (Signed)
Notified by secretary that pt's daughter called to state she was on her way to the hospital and wanted to speak with MD. MD notified of same via page. Awaiting callback from MD and arrival of pt's daughter. Previously informed by SW that she had left several messages for daughter that pt was to be discharged. Discharge order in place and pt awaiting transport via ambulance to [facility].

## 2017-12-29 NOTE — Discharge Instructions (Signed)
°  Blood Culture Test Why am I having this test? A blood culture test is performed to see if you have an infection in your blood (septicemia). Septicemia could be caused by bacteria, fungi, or viruses. Normally, blood is free of bacteria, fungi, and viruses. This test may be ordered if you have symptoms of septicemia. These symptoms may include fever, chills, nausea, and fatigue. What kind of sample is taken? At least two blood samples from two different veins are required for this test. The blood samples are usually collected by inserting a needle into a vein. This is done because:  There is a better chance of finding the infection with multiple samples.  Sometimes, despite disinfection of the skin where the blood is collected, you can grow a skin contaminant. This will result in a positive blood culture. This is called a false-positive. With multiple samples, there is a better chance of ruling out a false-positive.  How do I prepare for this test? It is preferred to have the blood samples performed before starting antibiotic medicine. Tell your health care provider if you are currently taking an antibiotic. If blood cultures are performed while you are on an antibiotic, the blood samples should be performed shortly before you take a dose of antibiotic. How are the results reported? Your test results will be reported as either positive or negative. It is your responsibility to obtain your test results. Ask the lab or department performing the test when and how you will get your results. A false-positive result can occur. A false-positive result is incorrect because it indicates a condition or finding is present when it is not. A false-negative result can occur. A false-negative result is incorrect because it indicates a condition or finding is not present when it is. What do the results mean? A positive blood test may mean that you have septicemia. Talk with your health care provider to discuss  your results, treatment options, and if necessary, the need for more tests. Talk with your health care provider if you have any questions about your results. Talk with your health care provider to discuss your results, treatment options, and if necessary, the need for more tests. Talk with your health care provider if you have any questions about your results. This information is not intended to replace advice given to you by your health care provider. Make sure you discuss any questions you have with your health care provider. Document Released: 07/13/2004 Document Revised: 02/22/2016 Document Reviewed: 11/25/2013 Elsevier Interactive Patient Education  2018 [place] with HD for 5 weeks. During the therapy- need to check Vanc trogh level weekly- goal 15-20                                  Need to check CBC weekly.

## 2017-12-29 NOTE — Progress Notes (Signed)
Pharmacy Antibiotic Note  Dennis Walker is a 67 y.o. male admitted on 12/24/2017 with MRSA Bacteremia.  Pharmacy has been consulted for vancomycin dosing.  Plan: Vancomycin 1,000mg  every T-Th-Sa with HD at 1200. Obtain vanc trough 6/29 prior to dialysis.  6/28:  Random level drawn 6/27 unaddressed. Level 24 after HD, before dose given. HD ended one hour earlier due to clotting issues. Target post-HD level 5-15 mcg/ml.  Chart review:  last HD was 6/22 before admission 6/23: vanc 1g + 1g (from 1 g q48h order) 6/24: vanc 750 mg x1 6/25 no HD (normal TTS schedule); received vanc 1g 6/26 HD session 6/27 HD session - one hour short due to clotting issues; received vanc 1g  Higher vancomycin level likely due to extra dose of vancomycin and shorter HD session yesterday.  Will change order to placeholder order for PRN order for lower dose of  vancomycin 750 mg qHD TTS - dose based on ABW of 77.5 kg. Will follow up next level ordered for 6/29 before 3rd HD session per protocol. Target pre-HD level 15-25 mcg/ml.  Will need to schedule vancomycin doses when  appropriate.   Height: 5\' 8"  (172.7 cm) Weight: 201 lb 3.2 oz (91.3 kg) IBW/kg (Calculated) : 68.4  Temp (24hrs), Avg:98.2 F (36.8 C), Min:97.7 F (36.5 C), Max:98.4 F (36.9 C)  Recent Labs  Lab 12/24/17 1053 12/24/17 1057 12/24/17 1432 12/25/17 0407 12/26/17 0838 12/27/17 0520 12/28/17 0601 12/28/17 0602 12/28/17 1622  WBC 11.3*  --   --  8.9 12.3* 10.6  --  10.1  --   CREATININE 5.30*  --   --  6.27* 7.99* 8.49* 7.08*  --   --   LATICACIDVEN  --  0.9 1.5  --   --   --   --   --   --   VANCORANDOM  --   --   --   --   --   --   --   --  24    Estimated Creatinine Clearance: 11.4 mL/min (A) (by C-G formula based on SCr of 7.08 mg/dL (H)).    Allergies  Allergen Reactions  . Cefuroxime Itching  . Septra Ds  [Sulfamethoxazole-Trimethoprim]     Other reaction(s): Itching  . Shrimp [Shellfish Allergy] Other (See Comments)   Per mar. Unknown reaction   . Sulfa Antibiotics Other (See Comments)    Per mar. Unknown reaction    Antimicrobials this admission: 6/23 aztreonam >> 6/23 6/23 levofloxacin >> 6/23 6/23 vancomycin >> 6/24 Zosyn >> 6/24   Dose adjustments this admission: 6/23 vancomycin 1,000mg  q48h >> 6/24 vancomycin 750mg  x1 dose & vancomycin 1,000mg  every Tuesday, Thursday, Saturday with HD  Microbiology results: 6/23 BCx: MRSA 6/23 Sputum: consistent with normal flora  6/23 MRSA PCR: negative 6/24 BCx: no growth x 4 days   Thank you for allowing pharmacy to be a part of this patient's care.  Rocky Morel 12/29/2017 10:01 AM

## 2017-12-29 NOTE — Progress Notes (Signed)
Nsg Discharge Note  Admit Date:  12/24/2017 Discharge date: 12/29/2017   [name] to be D/C'd to [facility] per MD order.  AVS completed.  Copy for chart, and copy for transport packet, and dated. Report called to [employer] and denies questions.  Discharge Medication: Allergies as of 12/29/2017      Reactions   Cefuroxime Itching   Septra Ds  [sulfamethoxazole-trimethoprim]    Other reaction(s): Itching   Shrimp [shellfish Allergy] Other (See Comments)   Per mar. Unknown reaction   Sulfa Antibiotics Other (See Comments)   Per mar. Unknown reaction      Medication List    STOP taking these medications   bumetanide 2 MG tablet Commonly known as:  BUMEX   hydrALAZINE 25 MG tablet Commonly known as:  APRESOLINE   Insulin Detemir 100 UNIT/ML Pen Commonly known as:  LEVEMIR FLEXPEN   linagliptin 5 MG Tabs tablet Commonly known as:  TRADJENTA   MELATONIN PO   metoprolol succinate 25 MG 24 hr tablet Commonly known as:  TOPROL-XL   ranolazine 500 MG 12 hr tablet Commonly known as:  RANEXA   traMADol 50 MG tablet Commonly known as:  ULTRAM   traZODone 50 MG tablet Commonly known as:  DESYREL     TAKE these medications   acetaminophen 500 MG tablet Commonly known as:  TYLENOL Take 500 mg by mouth every 4 (four) hours as needed (pain).   aspirin EC 81 MG tablet Take 81 mg by mouth daily at 6 PM.   atorvastatin 20 MG tablet Commonly known as:  LIPITOR Take 40 mg by mouth daily at 6 PM.   b complex vitamins capsule Take 1 capsule by mouth daily at 6 PM.   clopidogrel 75 MG tablet Commonly known as:  PLAVIX Take 75 mg by mouth daily at 6 PM.   eucerin cream Apply 1 application topically 2 (two) times daily as needed (for pruritus).   feeding supplement (NEPRO CARB STEADY) Liqd Take 237 mLs by mouth 3 (three) times daily between meals.   ferrous sulfate 325 (65 FE) MG tablet Take 325 mg by mouth every evening.   gabapentin 100 MG capsule Commonly known as:   NEURONTIN Take 1 capsule (100 mg total) by mouth every evening. What changed:    how much to take  when to take this  Another medication with the same name was removed. Continue taking this medication, and follow the directions you see here.   lactulose 10 GM/15ML solution Commonly known as:  CHRONULAC Take 20 g by mouth at bedtime.   lidocaine-prilocaine cream Commonly known as:  EMLA Apply 1 application topically Every Tuesday,Thursday,and Saturday with dialysis.   lisinopril 5 MG tablet Commonly known as:  PRINIVIL,ZESTRIL Take 5 mg by mouth every evening.   nitroGLYCERIN 0.4 MG SL tablet Commonly known as:  NITROSTAT Place 0.4 mg under the tongue every 5 (five) minutes as needed for chest pain.   ORAJEL MAXIMUM STRENGTH 20 % Gel Generic drug:  benzocaine Use as directed 1 application in the mouth or throat 4 (four) times daily as needed (pain).   oxyCODONE 5 MG immediate release tablet Commonly known as:  Oxy IR/ROXICODONE Take 1 tablet (5 mg total) by mouth 3 (three) times daily as needed for severe pain. What changed:  when to take this   polyethylene glycol packet Commonly known as:  MIRALAX / GLYCOLAX Take 17 g by mouth daily at 6 PM.   RENVELA 800 MG tablet  Generic drug:  sevelamer carbonate Take 1,600 mg by mouth 3 (three) times daily with meals. Or snacks   senna 8.6 MG Tabs tablet Commonly known as:  SENOKOT Take 8.6 mg by mouth 2 (two) times daily.   sertraline 100 MG tablet Commonly known as:  ZOLOFT Take 100 mg by mouth at bedtime.   SYSTANE ULTRA PF 0.4-0.3 % Soln Generic drug:  Polyethyl Glyc-Propyl Glyc PF Place 1 drop into the left eye 4 (four) times daily.   Vancomycin 750-5 MG/150ML-% Soln Commonly known as:  VANCOCIN Inject 150 mLs (750 mg total) into the vein 3 (three) times a week. With HD.       Discharge Assessment: Vitals:   12/28/17 2104 12/29/17 0407  BP: 138/65 (!) 107/59  Pulse: 96 88  Resp: 16 20  Temp: 97.7 F (36.5  C) 98 F (36.7 C)  SpO2: 100% 100%   Skin clean, dry and intact without evidence of skin break down, no evidence of skin tears noted. IV catheter discontinued intact. Site without signs and symptoms of complications - no redness or edema noted at insertion site, patient denies c/o pain - only slight tenderness at site.  Dressing with slight pressure applied.  D/c Instructions-Education: Discharge instructions given to patient/family with verbalized understanding. D/c education completed with receiving RN including follow up instructions, medication list, d/c activities limitations if indicated, with other d/c instructions as indicated by MD. D/d to [facility] via ambulance transfer.  Eda Keys, RN 12/29/2017 3:14 PM

## 2017-12-30 LAB — CULTURE, BLOOD (ROUTINE X 2)
CULTURE: NO GROWTH
Culture: NO GROWTH
Special Requests: ADEQUATE
Special Requests: ADEQUATE

## 2018-01-03 ENCOUNTER — Observation Stay
Admission: EM | Admit: 2018-01-03 | Discharge: 2018-01-05 | Disposition: A | Discharge status: Skilled Nursing Facility | Payer: Medicare Other | Attending: Internal Medicine | Admitting: Internal Medicine

## 2018-01-03 ENCOUNTER — Emergency Department: Payer: Medicare Other

## 2018-01-03 ENCOUNTER — Other Ambulatory Visit: Payer: Self-pay

## 2018-01-03 DIAGNOSIS — J45909 Unspecified asthma, uncomplicated: Secondary | ICD-10-CM | POA: Diagnosis not present

## 2018-01-03 DIAGNOSIS — R0789 Other chest pain: Secondary | ICD-10-CM | POA: Diagnosis not present

## 2018-01-03 DIAGNOSIS — I2510 Atherosclerotic heart disease of native coronary artery without angina pectoris: Secondary | ICD-10-CM | POA: Insufficient documentation

## 2018-01-03 DIAGNOSIS — M47812 Spondylosis without myelopathy or radiculopathy, cervical region: Secondary | ICD-10-CM | POA: Insufficient documentation

## 2018-01-03 DIAGNOSIS — I132 Hypertensive heart and chronic kidney disease with heart failure and with stage 5 chronic kidney disease, or end stage renal disease: Secondary | ICD-10-CM | POA: Diagnosis not present

## 2018-01-03 DIAGNOSIS — I252 Old myocardial infarction: Secondary | ICD-10-CM | POA: Diagnosis not present

## 2018-01-03 DIAGNOSIS — Z79899 Other long term (current) drug therapy: Secondary | ICD-10-CM | POA: Diagnosis not present

## 2018-01-03 DIAGNOSIS — E1140 Type 2 diabetes mellitus with diabetic neuropathy, unspecified: Secondary | ICD-10-CM | POA: Insufficient documentation

## 2018-01-03 DIAGNOSIS — Z882 Allergy status to sulfonamides status: Secondary | ICD-10-CM | POA: Insufficient documentation

## 2018-01-03 DIAGNOSIS — F329 Major depressive disorder, single episode, unspecified: Secondary | ICD-10-CM | POA: Insufficient documentation

## 2018-01-03 DIAGNOSIS — Z66 Do not resuscitate: Secondary | ICD-10-CM | POA: Diagnosis not present

## 2018-01-03 DIAGNOSIS — Z7902 Long term (current) use of antithrombotics/antiplatelets: Secondary | ICD-10-CM | POA: Diagnosis not present

## 2018-01-03 DIAGNOSIS — R079 Chest pain, unspecified: Secondary | ICD-10-CM

## 2018-01-03 DIAGNOSIS — F419 Anxiety disorder, unspecified: Secondary | ICD-10-CM | POA: Diagnosis not present

## 2018-01-03 DIAGNOSIS — D631 Anemia in chronic kidney disease: Secondary | ICD-10-CM | POA: Insufficient documentation

## 2018-01-03 DIAGNOSIS — E1151 Type 2 diabetes mellitus with diabetic peripheral angiopathy without gangrene: Secondary | ICD-10-CM | POA: Insufficient documentation

## 2018-01-03 DIAGNOSIS — N186 End stage renal disease: Secondary | ICD-10-CM | POA: Insufficient documentation

## 2018-01-03 DIAGNOSIS — Z8673 Personal history of transient ischemic attack (TIA), and cerebral infarction without residual deficits: Secondary | ICD-10-CM | POA: Diagnosis not present

## 2018-01-03 DIAGNOSIS — E1122 Type 2 diabetes mellitus with diabetic chronic kidney disease: Secondary | ICD-10-CM | POA: Insufficient documentation

## 2018-01-03 DIAGNOSIS — M542 Cervicalgia: Secondary | ICD-10-CM

## 2018-01-03 DIAGNOSIS — Z7982 Long term (current) use of aspirin: Secondary | ICD-10-CM | POA: Diagnosis not present

## 2018-01-03 DIAGNOSIS — Z881 Allergy status to other antibiotic agents status: Secondary | ICD-10-CM | POA: Insufficient documentation

## 2018-01-03 DIAGNOSIS — I509 Heart failure, unspecified: Secondary | ICD-10-CM | POA: Diagnosis not present

## 2018-01-03 DIAGNOSIS — Z992 Dependence on renal dialysis: Secondary | ICD-10-CM | POA: Diagnosis not present

## 2018-01-03 DIAGNOSIS — E785 Hyperlipidemia, unspecified: Secondary | ICD-10-CM | POA: Insufficient documentation

## 2018-01-03 LAB — VANCOMYCIN, RANDOM: Vancomycin Rm: 24

## 2018-01-03 LAB — CBC
HCT: 35.3 % — ABNORMAL LOW (ref 40.0–52.0)
Hemoglobin: 11.6 g/dL — ABNORMAL LOW (ref 13.0–18.0)
MCH: 27.5 pg (ref 26.0–34.0)
MCHC: 33 g/dL (ref 32.0–36.0)
MCV: 83.2 fL (ref 80.0–100.0)
PLATELETS: 346 10*3/uL (ref 150–440)
RBC: 4.24 MIL/uL — AB (ref 4.40–5.90)
RDW: 18.7 % — AB (ref 11.5–14.5)
WBC: 16.7 10*3/uL — ABNORMAL HIGH (ref 3.8–10.6)

## 2018-01-03 LAB — BASIC METABOLIC PANEL
Anion gap: 12 (ref 5–15)
BUN: 59 mg/dL — ABNORMAL HIGH (ref 8–23)
CALCIUM: 8 mg/dL — AB (ref 8.9–10.3)
CHLORIDE: 95 mmol/L — AB (ref 98–111)
CO2: 25 mmol/L (ref 22–32)
CREATININE: 6.43 mg/dL — AB (ref 0.61–1.24)
GFR calc Af Amer: 9 mL/min — ABNORMAL LOW (ref >60)
GFR calc non Af Amer: 8 mL/min — ABNORMAL LOW (ref >60)
GLUCOSE: 176 mg/dL — AB (ref 70–99)
Potassium: 4.4 mmol/L (ref 3.5–5.1)
Sodium: 132 mmol/L — ABNORMAL LOW (ref 135–145)

## 2018-01-03 LAB — TROPONIN I: Troponin I: 0.37 ng/mL (ref <0.03)

## 2018-01-03 MED ORDER — HYDROCODONE-ACETAMINOPHEN 5-325 MG PO TABS
1.0000 | ORAL_TABLET | ORAL | Status: DC | PRN
  Administered 2018-01-04 – 2018-01-05 (×4): 2 via ORAL
  Filled 2018-01-03 (×4): qty 2

## 2018-01-03 MED ORDER — SEVELAMER CARBONATE 800 MG PO TABS
1600.0000 mg | ORAL_TABLET | Freq: Three times a day (TID) | ORAL | Status: DC
  Administered 2018-01-04: 1600 mg via ORAL
  Filled 2018-01-03: qty 2

## 2018-01-03 MED ORDER — OXYCODONE HCL 5 MG PO TABS
5.0000 mg | ORAL_TABLET | Freq: Three times a day (TID) | ORAL | Status: DC | PRN
  Administered 2018-01-03 – 2018-01-05 (×2): 5 mg via ORAL
  Filled 2018-01-03 (×2): qty 1

## 2018-01-03 MED ORDER — ACETAMINOPHEN 325 MG PO TABS
650.0000 mg | ORAL_TABLET | Freq: Four times a day (QID) | ORAL | Status: DC | PRN
  Administered 2018-01-04: 650 mg via ORAL
  Filled 2018-01-03: qty 2

## 2018-01-03 MED ORDER — LISINOPRIL 5 MG PO TABS
5.0000 mg | ORAL_TABLET | Freq: Every evening | ORAL | Status: DC
  Administered 2018-01-04: 5 mg via ORAL
  Filled 2018-01-03 (×2): qty 1

## 2018-01-03 MED ORDER — DOCUSATE SODIUM 100 MG PO CAPS
100.0000 mg | ORAL_CAPSULE | Freq: Two times a day (BID) | ORAL | Status: DC
  Administered 2018-01-03 – 2018-01-05 (×4): 100 mg via ORAL
  Filled 2018-01-03 (×4): qty 1

## 2018-01-03 MED ORDER — NEPRO/CARBSTEADY PO LIQD
237.0000 mL | Freq: Three times a day (TID) | ORAL | Status: DC
  Administered 2018-01-04 – 2018-01-05 (×3): 237 mL via ORAL

## 2018-01-03 MED ORDER — HEPARIN SODIUM (PORCINE) 5000 UNIT/ML IJ SOLN
5000.0000 [IU] | Freq: Three times a day (TID) | INTRAMUSCULAR | Status: DC
  Administered 2018-01-03 – 2018-01-05 (×4): 5000 [IU] via SUBCUTANEOUS
  Filled 2018-01-03 (×4): qty 1

## 2018-01-03 MED ORDER — ONDANSETRON HCL 4 MG PO TABS: 4.0000 mg | ORAL_TABLET | Freq: Four times a day (QID) | ORAL | Status: DC | PRN

## 2018-01-03 MED ORDER — NITROGLYCERIN 0.4 MG SL SUBL: 0.4000 mg | SUBLINGUAL_TABLET | SUBLINGUAL | Status: DC | PRN

## 2018-01-03 MED ORDER — ADULT MULTIVITAMIN W/MINERALS CH
1.0000 | ORAL_TABLET | Freq: Every evening | ORAL | Status: DC
  Administered 2018-01-04: 1 via ORAL
  Filled 2018-01-03: qty 1

## 2018-01-03 MED ORDER — FERROUS SULFATE 325 (65 FE) MG PO TABS
325.0000 mg | ORAL_TABLET | Freq: Every evening | ORAL | Status: DC
  Administered 2018-01-04: 325 mg via ORAL
  Filled 2018-01-03: qty 1

## 2018-01-03 MED ORDER — BISACODYL 5 MG PO TBEC: 5.0000 mg | DELAYED_RELEASE_TABLET | Freq: Every day | ORAL | Status: DC | PRN

## 2018-01-03 MED ORDER — VANCOMYCIN HCL IN DEXTROSE 750-5 MG/150ML-% IV SOLN: 750.0000 mg | INTRAVENOUS | Status: DC

## 2018-01-03 MED ORDER — LIDOCAINE-PRILOCAINE 2.5-2.5 % EX CREA
1.0000 "application " | TOPICAL_CREAM | CUTANEOUS | Status: DC
  Filled 2018-01-03: qty 5

## 2018-01-03 MED ORDER — METOPROLOL TARTRATE 25 MG PO TABS
25.0000 mg | ORAL_TABLET | Freq: Two times a day (BID) | ORAL | Status: DC
  Administered 2018-01-03 – 2018-01-05 (×4): 25 mg via ORAL
  Filled 2018-01-03 (×4): qty 1

## 2018-01-03 MED ORDER — GABAPENTIN 100 MG PO CAPS
100.0000 mg | ORAL_CAPSULE | Freq: Every evening | ORAL | Status: DC
  Administered 2018-01-03 – 2018-01-04 (×2): 100 mg via ORAL
  Filled 2018-01-03 (×2): qty 1

## 2018-01-03 MED ORDER — SENNA 8.6 MG PO TABS
8.6000 mg | ORAL_TABLET | Freq: Two times a day (BID) | ORAL | Status: DC
  Administered 2018-01-03 – 2018-01-05 (×4): 8.6 mg via ORAL
  Filled 2018-01-03 (×4): qty 1

## 2018-01-03 MED ORDER — ONDANSETRON HCL 4 MG/2ML IJ SOLN: 4.0000 mg | Freq: Four times a day (QID) | INTRAMUSCULAR | Status: DC | PRN

## 2018-01-03 MED ORDER — TRAZODONE HCL 50 MG PO TABS
25.0000 mg | ORAL_TABLET | Freq: Every evening | ORAL | Status: DC | PRN
  Administered 2018-01-03 – 2018-01-04 (×2): 25 mg via ORAL
  Filled 2018-01-03 (×2): qty 1

## 2018-01-03 MED ORDER — ASPIRIN EC 81 MG PO TBEC
81.0000 mg | DELAYED_RELEASE_TABLET | Freq: Every day | ORAL | Status: DC
  Administered 2018-01-04: 81 mg via ORAL
  Filled 2018-01-03: qty 1

## 2018-01-03 MED ORDER — CLOPIDOGREL BISULFATE 75 MG PO TABS
75.0000 mg | ORAL_TABLET | Freq: Every day | ORAL | Status: DC
  Administered 2018-01-04: 75 mg via ORAL
  Filled 2018-01-03: qty 1

## 2018-01-03 MED ORDER — POLYETHYLENE GLYCOL 3350 17 G PO PACK
17.0000 g | PACK | Freq: Every day | ORAL | Status: DC
  Administered 2018-01-04: 17 g via ORAL
  Filled 2018-01-03: qty 1

## 2018-01-03 MED ORDER — ATORVASTATIN CALCIUM 20 MG PO TABS
40.0000 mg | ORAL_TABLET | Freq: Every day | ORAL | Status: DC
  Administered 2018-01-04: 40 mg via ORAL
  Filled 2018-01-03: qty 2

## 2018-01-03 MED ORDER — ACETAMINOPHEN 650 MG RE SUPP: 650.0000 mg | Freq: Four times a day (QID) | RECTAL | Status: DC | PRN

## 2018-01-03 MED ORDER — VITAMIN C 500 MG PO TABS
500.0000 mg | ORAL_TABLET | Freq: Two times a day (BID) | ORAL | Status: DC
  Administered 2018-01-03 – 2018-01-05 (×4): 500 mg via ORAL
  Filled 2018-01-03 (×5): qty 1

## 2018-01-03 MED ORDER — LACTULOSE 10 GM/15ML PO SOLN
20.0000 g | Freq: Every day | ORAL | Status: DC
  Administered 2018-01-03 – 2018-01-04 (×2): 20 g via ORAL
  Filled 2018-01-03 (×2): qty 30

## 2018-01-03 MED ORDER — PRO-STAT SUGAR FREE PO LIQD
30.0000 mL | Freq: Three times a day (TID) | ORAL | Status: DC
  Administered 2018-01-04 – 2018-01-05 (×3): 30 mL via ORAL

## 2018-01-03 MED ORDER — SERTRALINE HCL 50 MG PO TABS
100.0000 mg | ORAL_TABLET | Freq: Every day | ORAL | Status: DC
  Administered 2018-01-03 – 2018-01-04 (×2): 100 mg via ORAL
  Filled 2018-01-03 (×2): qty 2

## 2018-01-03 MED ORDER — B COMPLEX-C PO TABS
1.0000 | ORAL_TABLET | Freq: Every day | ORAL | Status: DC
  Administered 2018-01-04: 1 via ORAL
  Filled 2018-01-03 (×2): qty 1

## 2018-01-03 MED ORDER — POLYVINYL ALCOHOL 1.4 % OP SOLN
1.0000 [drp] | Freq: Four times a day (QID) | OPHTHALMIC | Status: DC
  Administered 2018-01-03 – 2018-01-05 (×6): 1 [drp] via OPHTHALMIC
  Filled 2018-01-03: qty 15

## 2018-01-03 NOTE — ED Notes (Signed)
MD made aware of critical troponin lab value of 0.37
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
## 2018-01-03 NOTE — ED Triage Notes (Signed)
Pt arrives to ED via [facility] from [facility] with c/o chest pain x2 days. EMS states pain is reproducible with palpation; pt states pain is non-radiating. EMS reports pt was recently seen, treated and d/c'd for pneumonia and sepsis last week. Facility reported pt was given 1 dose of Nitro yesterday witrh relief of s/x's, dose given today for CP did not. Pt arrives with a 20G PIV in the right hand, 1" nitro paste on left side of chest. EMS brought pt in on 3L O2 via [place] d/t sats in the low 90's, pt is currently at 96% on RA.
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
## 2018-01-04 ENCOUNTER — Observation Stay: Payer: Medicare Other

## 2018-01-04 DIAGNOSIS — R0789 Other chest pain: Secondary | ICD-10-CM | POA: Diagnosis not present

## 2018-01-04 LAB — CBC
HEMATOCRIT: 32.5 % — AB (ref 40.0–52.0)
HEMOGLOBIN: 10.6 g/dL — AB (ref 13.0–18.0)
MCH: 27.1 pg (ref 26.0–34.0)
MCHC: 32.8 g/dL (ref 32.0–36.0)
MCV: 82.6 fL (ref 80.0–100.0)
Platelets: 390 10*3/uL (ref 150–440)
RBC: 3.93 MIL/uL — ABNORMAL LOW (ref 4.40–5.90)
RDW: 18.8 % — ABNORMAL HIGH (ref 11.5–14.5)
WBC: 16.1 10*3/uL — ABNORMAL HIGH (ref 3.8–10.6)

## 2018-01-04 LAB — BASIC METABOLIC PANEL
Anion gap: 15 (ref 5–15)
BUN: 62 mg/dL — ABNORMAL HIGH (ref 8–23)
CHLORIDE: 95 mmol/L — AB (ref 98–111)
CO2: 23 mmol/L (ref 22–32)
Calcium: 8 mg/dL — ABNORMAL LOW (ref 8.9–10.3)
Creatinine, Ser: 6.75 mg/dL — ABNORMAL HIGH (ref 0.61–1.24)
GFR calc Af Amer: 9 mL/min — ABNORMAL LOW (ref >60)
GFR, EST NON AFRICAN AMERICAN: 8 mL/min — AB (ref >60)
GLUCOSE: 159 mg/dL — AB (ref 70–99)
POTASSIUM: 3.5 mmol/L (ref 3.5–5.1)
Sodium: 133 mmol/L — ABNORMAL LOW (ref 135–145)

## 2018-01-04 LAB — GLUCOSE, CAPILLARY: Glucose-Capillary: 143 mg/dL — ABNORMAL HIGH (ref 70–99)

## 2018-01-04 LAB — TROPONIN I
TROPONIN I: 0.21 ng/mL — AB (ref <0.03)
Troponin I: 0.3 ng/mL (ref <0.03)

## 2018-01-04 MED ORDER — CHLORHEXIDINE GLUCONATE CLOTH 2 % EX PADS
6.0000 | MEDICATED_PAD | Freq: Every day | CUTANEOUS | Status: DC
  Administered 2018-01-04: 6 via TOPICAL

## 2018-01-04 MED ORDER — DIPHENHYDRAMINE HCL 50 MG/ML IJ SOLN
INTRAMUSCULAR | Status: AC
  Filled 2018-01-04: qty 1

## 2018-01-04 MED ORDER — SODIUM CHLORIDE 0.9 % IV SOLN: 100.0000 mL | INTRAVENOUS | Status: DC | PRN

## 2018-01-04 MED ORDER — PENTAFLUOROPROP-TETRAFLUOROETH EX AERO
1.0000 "application " | INHALATION_SPRAY | CUTANEOUS | Status: DC | PRN
  Filled 2018-01-04: qty 30

## 2018-01-04 MED ORDER — LIDOCAINE-PRILOCAINE 2.5-2.5 % EX CREA
1.0000 "application " | TOPICAL_CREAM | CUTANEOUS | Status: DC | PRN
  Filled 2018-01-04: qty 5

## 2018-01-04 MED ORDER — EPOETIN ALFA 10000 UNIT/ML IJ SOLN
4000.0000 [IU] | Freq: Once | INTRAMUSCULAR | Status: DC
  Administered 2018-01-04: 4000 [IU] via INTRAVENOUS
  Filled 2018-01-04: qty 1

## 2018-01-04 MED ORDER — EPOETIN ALFA 10000 UNIT/ML IJ SOLN
4000.0000 [IU] | Freq: Once | INTRAMUSCULAR | Status: AC
  Administered 2018-01-04: 4000 [IU] via INTRAVENOUS

## 2018-01-04 MED ORDER — LIDOCAINE HCL (PF) 1 % IJ SOLN
5.0000 mL | INTRAMUSCULAR | Status: DC | PRN
  Filled 2018-01-04: qty 5

## 2018-01-04 MED ORDER — HEPARIN SODIUM (PORCINE) 1000 UNIT/ML DIALYSIS
20.0000 [IU]/kg | INTRAMUSCULAR | Status: DC | PRN
  Filled 2018-01-04: qty 2

## 2018-01-04 MED ORDER — DIPHENHYDRAMINE HCL 50 MG/ML IJ SOLN
12.5000 mg | Freq: Four times a day (QID) | INTRAMUSCULAR | Status: DC | PRN
  Administered 2018-01-04: 12.5 mg via INTRAMUSCULAR
  Filled 2018-01-04: qty 1

## 2018-01-04 MED ORDER — VANCOMYCIN HCL IN DEXTROSE 750-5 MG/150ML-% IV SOLN
750.0000 mg | INTRAVENOUS | Status: DC
Start: 2018-01-04 — End: 2018-01-05
  Administered 2018-01-04: 750 mg via INTRAVENOUS
  Filled 2018-01-04 (×2): qty 150

## 2018-01-04 MED ORDER — IBUPROFEN 400 MG PO TABS
400.0000 mg | ORAL_TABLET | Freq: Four times a day (QID) | ORAL | Status: DC | PRN
  Filled 2018-01-04: qty 1

## 2018-01-04 NOTE — Progress Notes (Signed)
Post HD Tx    01/04/18 1323  Hand-Off documentation  Report given to (Full Name) Gildardo Pounds, RN   Report received from (Full Name) Beatris Ship, RN   Vital Signs  Temp 98.4 F (36.9 C)  Temp Source Oral  Pulse Rate 89  Pulse Rate Source Monitor  Resp 11  BP (!) 131/57  BP Location Right Arm  BP Method Automatic  Patient Position (if appropriate) Lying  Oxygen Therapy  SpO2 100 %  O2 Device Room Air  Pulse Oximetry Type Continuous  Dialysis Weight  Weight 85.8 kg (189 lb 2.5 oz)  Type of Weight Post-Dialysis  Post-Hemodialysis Assessment  Rinseback Volume (mL) 250 mL  KECN 61 V  Dialyzer Clearance Lightly streaked  Duration of HD Treatment -hour(s) 3.5 hour(s)  Hemodialysis Intake (mL) 500 mL  UF Total -Machine (mL) 501 mL  Net UF (mL) 1 mL  Tolerated HD Treatment Yes  AVG/AVF Arterial Site Held (minutes) 10 minutes  AVG/AVF Venous Site Held (minutes) 10 minutes

## 2018-01-04 NOTE — Progress Notes (Signed)
HD Tx started w/o complication    16/10/96 0951  Vital Signs  Pulse Rate 82  Pulse Rate Source Monitor  Resp 16  BP (!) 109/51  BP Location Right Arm  BP Method Automatic  Patient Position (if appropriate) Lying  Oxygen Therapy  SpO2 99 %  O2 Device Room Air  Pulse Oximetry Type Continuous  During Hemodialysis Assessment  Blood Flow Rate (mL/min) 400 mL/min  Arterial Pressure (mmHg) -230 mmHg  Venous Pressure (mmHg) 180 mmHg  Transmembrane Pressure (mmHg) 50 mmHg  Ultrafiltration Rate (mL/min) 140 mL/min  Dialysate Flow Rate (mL/min) 800 ml/min  Conductivity: Machine  14  HD Safety Checks Performed Yes  Dialysis Fluid Bolus Normal Saline  Bolus Amount (mL) 250 mL  Intra-Hemodialysis Comments Tx initiated  Fistula / Graft Left Upper arm Arteriovenous fistula  Placement Date/Time: 12/24/17 1400   Placed prior to admission: Yes  Orientation: Left  Access Location: Upper arm  Access Type: Arteriovenous fistula  Status Accessed  Needle Size 15

## 2018-01-04 NOTE — Progress Notes (Signed)
Post HD Assessment, pt tolerated well, no UF goal, report given to primary. No change in    01/04/18 1328  Neurological  Level of Consciousness Alert  Orientation Level Oriented X4  Respiratory  Respiratory Pattern Regular  Chest Assessment Chest expansion symmetrical  Bilateral Breath Sounds Clear;Diminished  Cough None  Cardiac  Pulse Regular  Heart Sounds S1, S2  ECG Monitor Yes  Vascular  R Radial Pulse +2  L Radial Pulse +2  Edema Generalized  Psychosocial  Psychosocial (WDL) WDL  status from baseline.

## 2018-01-04 NOTE — Progress Notes (Signed)
Patient ID: Dennis Walker, male   DOB: 12-Jul-1950, 67 y.o.   MRN: [id number]  [facility] PROGRESS NOTE  Dennis Walker TGG:[id number] DOB: 1951/05/20 DOA: 01/03/2018 PCP: Juluis Pitch, MD  HPI/Subjective: Patient seen while on dialysis.  States he is having body aches all over.  I asked him if there is anywhere in particular that it is the worst.  He said no.  Yesterday had some chest pain.  Today not having pain.  Objective: Vitals:   01/04/18 1245 01/04/18 1300  BP: 135/65 (!) 130/49  Pulse: 85 86  Resp: 14 14  Temp:    SpO2: 100% 100%    Filed Weights   01/03/18 2142 01/04/18 0421 01/04/18 0949  Weight: 85.8 kg (189 lb 3.2 oz) 85.6 kg (188 lb 11.2 oz) 85.5 kg (188 lb 7.9 oz)    ROS: Review of Systems  Constitutional: Negative for chills and fever.  Eyes: Negative for blurred vision.  Respiratory: Negative for cough and shortness of breath.   Cardiovascular: Negative for chest pain.  Gastrointestinal: Negative for abdominal pain, constipation, diarrhea, nausea and vomiting.  Genitourinary: Negative for dysuria.  Musculoskeletal: Positive for joint pain and neck pain.  Neurological: Negative for dizziness and headaches.   Exam: Physical Exam  Constitutional: He is oriented to person, place, and time.  HENT:  Nose: No mucosal edema.  Mouth/Throat: No oropharyngeal exudate or posterior oropharyngeal edema.  Eyes: Pupils are equal, round, and reactive to light. Conjunctivae, EOM and lids are normal.  Neck: No JVD present. Carotid bruit is not present. No edema present. No thyroid mass and no thyromegaly present.  Cardiovascular: S1 normal and S2 normal. Exam reveals no gallop.  No murmur heard. Pulses:      Dorsalis pedis pulses are 2+ on the right side, and 2+ on the left side.  Respiratory: No respiratory distress. He has decreased breath sounds in the right lower field and the left lower field. He has no wheezes. He has rhonchi in the right lower field and the left  lower field. He has no rales.  GI: Soft. Bowel sounds are normal. There is no tenderness.  Musculoskeletal:       Right knee: He exhibits no swelling.       Left ankle: He exhibits swelling.  Lymphadenopathy:    He has no cervical adenopathy.  Neurological: He is alert and oriented to person, place, and time. No cranial nerve deficit.  Skin: Skin is warm. No rash noted. Nails show no clubbing.  Psychiatric: He has a normal mood and affect.      Data Reviewed: Basic Metabolic Panel: Recent Labs  Lab 01/03/18 1953 01/04/18 0544  NA 132* 133*  K 4.4 3.5  CL 95* 95*  CO2 25 23  GLUCOSE 176* 159*  BUN 59* 62*  CREATININE 6.43* 6.75*  CALCIUM 8.0* 8.0*   CBC: Recent Labs  Lab 01/03/18 1953 01/04/18 0544  WBC 16.7* 16.1*  HGB 11.6* 10.6*  HCT 35.3* 32.5*  MCV 83.2 82.6  PLT 346 390   Cardiac Enzymes: Recent Labs  Lab 01/03/18 1953 01/04/18 0544  TROPONINI 0.37* 0.30*    CBG: Recent Labs  Lab 12/28/17 2116 12/29/17 0010 12/29/17 0410 12/29/17 0746 01/04/18 0812  GLUCAP 170* 120* 136* 128* 143*    Studies: Dg Chest Portable 1 View  Result Date: 01/03/2018 CLINICAL DATA:  Chest pain EXAM: PORTABLE CHEST 1 VIEW COMPARISON:  12/25/2017 FINDINGS: Cardiomegaly. No confluent airspace opacities or effusions. No acute bony abnormality. IMPRESSION:  Cardiomegaly.  No active disease. Electronically Signed   By: Rolm Baptise M.D.   On: 01/03/2018 20:09    Scheduled Meds: . aspirin EC  81 mg Oral q1800  . atorvastatin  40 mg Oral q1800  . B-complex with vitamin C  1 tablet Oral q1800  . Chlorhexidine Gluconate Cloth  6 each Topical Q0600  . clopidogrel  75 mg Oral q1800  . docusate sodium  100 mg Oral BID  . feeding supplement (NEPRO CARB STEADY)  237 mL Oral TID BM  . feeding supplement (PRO-STAT SUGAR FREE 64)  30 mL Oral TID WC  . ferrous sulfate  325 mg Oral QPM  . gabapentin  100 mg Oral QPM  . heparin  5,000 Units Subcutaneous Q8H  . lactulose  20 g Oral  QHS  . lidocaine-prilocaine  1 application Topical Q T,Th,Sa-HD  . lisinopril  5 mg Oral QPM  . metoprolol tartrate  25 mg Oral BID  . multivitamin with minerals  1 tablet Oral QPM  . polyethylene glycol  17 g Oral q1800  . polyvinyl alcohol  1 drop Left Eye QID  . senna  8.6 mg Oral BID  . sertraline  100 mg Oral QHS  . ascorbic acid  500 mg Oral BID   Continuous Infusions: . sodium chloride    . sodium chloride    . vancomycin 750 mg (01/04/18 1220)    Assessment/Plan:  1. Chest pain.  Borderline troponin.  Ordered a third troponin for this afternoon. 2. Recent sepsis and MRSA bacteremia.  On IV vancomycin for total of 6 weeks.  Vancomycin today with dialysis.  White blood cell count is up.  Recheck tomorrow morning. 3. End-stage renal disease on hemodialysis today. 4. Essential hypertension.  On metoprolol, lisinopril 5. Type 2 diabetes mellitus on sliding scale insulin 6. Hyperlipidemia unspecified on atorvastatin 7. Neuropathy on gabapentin 8. Body aches and neck pain.  X-ray of the cervical spine.  Dr. Candiss Norse started ibuprofen.  Code Status:     Code Status Orders  (From admission, onward)        Start     Ordered   01/03/18 2056  Full code  Continuous     01/03/18 2057    Code Status History    Date Active Date Inactive Code Status Order ID Comments User Context   12/24/2017 1359 12/29/2017 1957 Full Code [id number]  Demetrios Loll, MD Inpatient   08/05/2017 1756 08/06/2017 1829 Full Code [id number]  Bettey Costa, MD Inpatient   02/02/2017 1830 02/05/2017 1825 Full Code [id number]  Henreitta Leber, MD Inpatient    Advance Directive Documentation     Most Recent Value  Type of Advance Directive  Healthcare Power of Attorney  Pre-existing out of facility DNR order (yellow form or pink MOST form)  -  "MOST" Form in Place?  -     Family Communication: Spoke with daughter on the phone Disposition Plan: Potentially back to facility tomorrow  Antibiotics:  IV  vancomycin  Time spent: 28 minutes  [place]

## 2018-01-04 NOTE — Progress Notes (Signed)
Pharmacy Antibiotic Note  Dennis Walker is a 67 y.o. male admitted on 01/03/2018 with bacteremia.  Pharmacy has been consulted for vancomycin dosing. Patient is a re-admit for chest pain. Placed on outpatient vancomycin qTThSa w/ HD for MRSA bacteremia.   Random level upon admission is 24 mcg/mL.  Plan: Will start vanc 750 mg IV qTThSa w/ HD  Will check a pre-dialysis level 07/06 w/ am labs to ensure within normal range (< 25 mcg/mL)  Height: 5\' 11"  (180.3 cm) Weight: 189 lb 3.2 oz (85.8 kg) IBW/kg (Calculated) : 75.3  Temp (24hrs), Avg:98.3 F (36.8 C), Min:98.1 F (36.7 C), Max:98.4 F (36.9 C)  Recent Labs  Lab 12/28/17 0601 12/28/17 0602 12/28/17 1622 01/03/18 1953 01/03/18 2311  WBC  --  10.1  --  16.7*  --   CREATININE 7.08*  --   --  6.43*  --   VANCORANDOM  --   --  24  --  24    Estimated Creatinine Clearance: 12.2 mL/min (A) (by C-G formula based on SCr of 6.43 mg/dL (H)).    Allergies  Allergen Reactions  . Cefuroxime Itching  . Septra Ds  [Sulfamethoxazole-Trimethoprim]     Other reaction(s): Itching  . Shrimp [Shellfish Allergy] Other (See Comments)    Per mar. Unknown reaction   . Sulfa Antibiotics Other (See Comments)    Per mar. Unknown reaction    Thank you for allowing pharmacy to be a part of this patient's care.  Tobie Lords, PharmD, BCPS Clinical Pharmacist 01/04/2018

## 2018-01-04 NOTE — [place] FL2 (Signed)
[place] LEVEL OF CARE SCREENING TOOL     IDENTIFICATION  Patient Name: Dennis Walker Birthdate: 11/29/1950 Sex: male Admission Date (Current Location): 01/03/2018  [facility] and [place] Number:  [name] ([id number] [facility]) Facility and Address:  [facility], [address], [place], [place] [address]      Provider Number: [id number]  Attending Physician Name and Address:  Loletha Grayer, MD  Relative Name and Phone Number:       Current Level of Care: Hospital Recommended Level of Care: [place] Prior Approval Number:    Date Approved/Denied:   PASRR Number: [id number])  Discharge Plan: SNF    Current Diagnoses: Patient Active Problem List   Diagnosis Date Noted  . Chest pain, mid sternal 01/03/2018  . Acute on chronic respiratory failure with hypoxia ([place]) 12/24/2017  . Elevated troponin 08/05/2017  . 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 Complication from renal dialysis device 02/20/2017  . End stage renal disease ([place]) 02/12/2017  . Altered mental status 02/02/2017  . Atherosclerosis of native arteries of extremity with intermittent claudication ([place]) 12/20/2016  . Ulcer of amputation stump of lower extremity ([place]) 12/20/2016  . Diabetes ([place]) 12/20/2016  . Essential hypertension 12/20/2016  . Pure hypercholesterolemia 12/20/2016    Orientation RESPIRATION BLADDER Height & Weight     Self, Time, Situation, Place  Normal Continent Weight: 188 lb 11.2 oz (85.6 kg) Height:  5\' 11"  (180.3 cm)  BEHAVIORAL SYMPTOMS/MOOD NEUROLOGICAL BOWEL NUTRITION STATUS      Continent Diet(Diet: Renal/ Carb Modified. )  AMBULATORY STATUS COMMUNICATION OF NEEDS Skin   Extensive Assist Verbally Normal                       Personal Care Assistance Level of Assistance  Bathing, Feeding, Dressing Bathing Assistance: Limited assistance Feeding assistance: Independent Dressing Assistance: Limited assistance      Functional Limitations Info  Sight, Hearing, Speech Sight Info: Adequate Hearing Info: Adequate Speech Info: Adequate    SPECIAL CARE FACTORS FREQUENCY  PT (By licensed PT)(Dialysis )     PT Frequency: (5)              Contractures      Additional Factors Info  Code Status, Allergies, Isolation Precautions Code Status Info: (Full Code. ) Allergies Info: (Cefuroxime, Septra Ds  Sulfamethoxazole-trimethoprim, Shrimp Shellfish Allergy, Sulfa Antibiotics)     Isolation Precautions Info: (MRSA in blood. )     Current Medications (01/04/2018):  This is the current hospital active medication list Current Facility-Administered Medications  Medication Dose Route Frequency Provider Last Rate Last Dose  . 0.9 %  sodium chloride infusion  100 mL Intravenous PRN Singh, Harmeet, MD      . 0.9 %  sodium chloride infusion  100 mL Intravenous PRN Murlean Iba, MD      . acetaminophen (TYLENOL) tablet 650 mg  650 mg Oral Q6H PRN Epifanio Lesches, MD   650 mg at 01/04/18 0217   Or  . acetaminophen (TYLENOL) suppository 650 mg  650 mg Rectal Q6H PRN Epifanio Lesches, MD      . aspirin EC tablet 81 mg  81 mg Oral q1800 Epifanio Lesches, MD      . atorvastatin (LIPITOR) tablet 40 mg  40 mg Oral q1800 Epifanio Lesches, MD      . B-complex with vitamin C tablet 1 tablet  1 tablet Oral q1800 Epifanio Lesches, MD      . bisacodyl (DULCOLAX) EC  tablet 5 mg  5 mg Oral Daily PRN Epifanio Lesches, MD      . Chlorhexidine Gluconate Cloth 2 % PADS 6 each  6 each Topical Q0600 Murlean Iba, MD      . clopidogrel (PLAVIX) tablet 75 mg  75 mg Oral q1800 Epifanio Lesches, MD      . docusate sodium (COLACE) capsule 100 mg  100 mg Oral BID Epifanio Lesches, MD   100 mg at 01/04/18 0824  . feeding supplement (NEPRO CARB STEADY) liquid 237 mL  237 mL Oral TID BM Epifanio Lesches, MD   237 mL at 01/04/18 0956  . feeding supplement (PRO-STAT SUGAR FREE 64) liquid 30 mL  30  mL Oral TID WC Epifanio Lesches, MD   30 mL at 01/04/18 0956  . ferrous sulfate tablet 325 mg  325 mg Oral QPM Epifanio Lesches, MD      . gabapentin (NEURONTIN) capsule 100 mg  100 mg Oral QPM Epifanio Lesches, MD   100 mg at 01/03/18 2206  . heparin injection 1,700 Units  20 Units/kg Dialysis PRN Murlean Iba, MD      . heparin injection 5,000 Units  5,000 Units Subcutaneous Q8H Epifanio Lesches, MD   5,000 Units at 01/03/18 2208  . HYDROcodone-acetaminophen (NORCO/VICODIN) 5-325 MG per tablet 1-2 tablet  1-2 tablet Oral Q4H PRN Epifanio Lesches, MD   2 tablet at 01/04/18 0824  . ibuprofen (ADVIL,MOTRIN) tablet 400 mg  400 mg Oral Q6H PRN Murlean Iba, MD      . lactulose (CHRONULAC) 10 GM/15ML solution 20 g  20 g Oral QHS Epifanio Lesches, MD   20 g at 01/03/18 2208  . lidocaine (PF) (XYLOCAINE) 1 % injection 5 mL  5 mL Intradermal PRN Murlean Iba, MD      . lidocaine-prilocaine (EMLA) cream 1 application  1 application Topical Q T,Th,Sa-HD Epifanio Lesches, MD      . lidocaine-prilocaine (EMLA) cream 1 application  1 application Topical PRN Candiss Norse, Harmeet, MD      . lisinopril (PRINIVIL,ZESTRIL) tablet 5 mg  5 mg Oral QPM Epifanio Lesches, MD      . metoprolol tartrate (LOPRESSOR) tablet 25 mg  25 mg Oral BID Epifanio Lesches, MD   25 mg at 01/04/18 0824  . multivitamin with minerals tablet 1 tablet  1 tablet Oral QPM Epifanio Lesches, MD      . nitroGLYCERIN (NITROSTAT) SL tablet 0.4 mg  0.4 mg Sublingual Q5 min PRN Epifanio Lesches, MD      . ondansetron (ZOFRAN) tablet 4 mg  4 mg Oral Q6H PRN Epifanio Lesches, MD       Or  . ondansetron (ZOFRAN) injection 4 mg  4 mg Intravenous Q6H PRN Epifanio Lesches, MD      . oxyCODONE (Oxy IR/ROXICODONE) immediate release tablet 5 mg  5 mg Oral TID PRN Epifanio Lesches, MD   5 mg at 01/03/18 2207  . pentafluoroprop-tetrafluoroeth (GEBAUERS) aerosol 1 application  1 application Topical PRN  Singh, Harmeet, MD      . polyethylene glycol (MIRALAX / GLYCOLAX) packet 17 g  17 g Oral q1800 Epifanio Lesches, MD      . polyvinyl alcohol (LIQUIFILM TEARS) 1.4 % ophthalmic solution 1 drop  1 drop Left Eye QID Epifanio Lesches, MD   1 drop at 01/04/18 0825  . senna (SENOKOT) tablet 8.6 mg  8.6 mg Oral BID Epifanio Lesches, MD   8.6 mg at 01/04/18 0824  . sertraline (ZOLOFT) tablet 100 mg  100 mg  Oral QHS Epifanio Lesches, MD   100 mg at 01/03/18 2207  . sevelamer carbonate (RENVELA) tablet 1,600 mg  1,600 mg Oral TID WC Epifanio Lesches, MD   1,600 mg at 01/04/18 0824  . traZODone (DESYREL) tablet 25 mg  25 mg Oral QHS PRN Epifanio Lesches, MD   25 mg at 01/03/18 2206  . vancomycin (VANCOCIN) IVPB 750 mg/150 ml premix  750 mg Intravenous Q T,Th,Sa-HD Epifanio Lesches, MD      . vitamin C (ASCORBIC ACID) tablet 500 mg  500 mg Oral BID Epifanio Lesches, MD   500 mg at 01/04/18 5872     Discharge Medications: Please see discharge summary for a list of discharge medications.  Relevant Imaging Results:  Relevant Lab Results:   Additional Information (SSN: [id number])  Juanya Villavicencio, Veronia Beets, LCSW

## 2018-01-04 NOTE — Plan of Care (Signed)
  Problem: Education: Goal: Knowledge of General Education information will improve Outcome: Progressing   Problem: Clinical Measurements: Goal: Ability to maintain clinical measurements within normal limits will improve Outcome: Progressing   Problem: Skin Integrity: Goal: Risk for impaired skin integrity will decrease Outcome: Progressing

## 2018-01-04 NOTE — Progress Notes (Signed)
HD Tx completed   01/04/18 1320  Vital Signs  Pulse Rate 89  Pulse Rate Source Monitor  Resp 18  BP 128/61  BP Location Right Arm  BP Method Automatic  Patient Position (if appropriate) Lying  Oxygen Therapy  O2 Device Room Air  Pulse Oximetry Type Continuous  During Hemodialysis Assessment  HD Safety Checks Performed Yes  KECN 61 KECN  Dialysis Fluid Bolus Normal Saline  Bolus Amount (mL) 250 mL  Intra-Hemodialysis Comments Tx completed;Tolerated well  Fistula / Graft Left Upper arm Arteriovenous fistula  Placement Date/Time: 12/24/17 1400   Placed prior to admission: Yes  Orientation: Left  Access Location: Upper arm  Access Type: Arteriovenous fistula  Site Condition No complications  Fistula / Graft Assessment Thrill;Bruit;Present  Status Deaccessed  Drainage Description None

## 2018-01-04 NOTE — Progress Notes (Signed)
[facility], [place] 01/04/18  Subjective:   Patient known to our practice from outpatient HD Admitted for Chest pain States he hurts all over Appetite is very poor Seen during HD   HEMODIALYSIS FLOWSHEET:  Blood Flow Rate (mL/min): 400 mL/min Arterial Pressure (mmHg): -230 mmHg Venous Pressure (mmHg): 290 mmHg Transmembrane Pressure (mmHg): 50 mmHg Ultrafiltration Rate (mL/min): 140 mL/min Dialysate Flow Rate (mL/min): 800 ml/min Conductivity: Machine : 14 Conductivity: Machine : 14 Dialysis Fluid Bolus: Normal Saline Bolus Amount (mL): 250 mL    Objective:  Vital signs in last 24 hours:  Temp:  [98.1 F (36.7 C)-98.7 F (37.1 C)] 98.4 F (36.9 C) (07/04 0949) Pulse Rate:  [78-98] 78 (07/04 1200) Resp:  [12-39] 18 (07/04 1200) BP: (104-180)/(51-87) 105/51 (07/04 1200) SpO2:  [95 %-100 %] 98 % (07/04 1200) Weight:  [85.5 kg (188 lb 7.9 oz)-91.2 kg (201 lb)] 85.5 kg (188 lb 7.9 oz) (07/04 0949)  Weight change:  Filed Weights   01/03/18 2142 01/04/18 0421 01/04/18 0949  Weight: 85.8 kg (189 lb 3.2 oz) 85.6 kg (188 lb 11.2 oz) 85.5 kg (188 lb 7.9 oz)    Intake/Output:    Intake/Output Summary (Last 24 hours) at 01/04/2018 1232 Last data filed at 01/04/2018 0530 Gross per 24 hour  Intake -  Output 250 ml  Net -250 ml     Physical Exam: General: Ill appearing  HEENT Moist oral mucus membranes  Neck supple  Pulm/lungs Normal effort, coarse breath sounds  CVS/Heart Nu rub  Abdomen:  Soft, NT  Extremities: Left AKA, rt no edema  Neurologic: Lethargic but able to answer questions  Skin: dry  Access: AVG, left arm       Basic Metabolic Panel:  Recent Labs  Lab 01/03/18 1953 01/04/18 0544  NA 132* 133*  K 4.4 3.5  CL 95* 95*  CO2 25 23  GLUCOSE 176* 159*  BUN 59* 62*  CREATININE 6.43* 6.75*  CALCIUM 8.0* 8.0*     CBC: Recent Labs  Lab 01/03/18 1953 01/04/18 0544  WBC 16.7* 16.1*  HGB 11.6* 10.6*  HCT 35.3*  32.5*  MCV 83.2 82.6  PLT 346 390      Lab Results  Component Value Date   HEPBSAG Negative 12/26/2017   HEPBSAB Non Reactive 12/26/2017      Microbiology:  No results found for this or any previous visit (from the past 240 hour(s)).  Coagulation Studies: No results for input(s): LABPROT, INR in the last 72 hours.  Urinalysis: No results for input(s): COLORURINE, LABSPEC, PHURINE, GLUCOSEU, HGBUR, BILIRUBINUR, KETONESUR, PROTEINUR, UROBILINOGEN, NITRITE, LEUKOCYTESUR in the last 72 hours.  Invalid input(s): APPERANCEUR    Imaging: Dg Chest Portable 1 View  Result Date: 01/03/2018 CLINICAL DATA:  Chest pain EXAM: PORTABLE CHEST 1 VIEW COMPARISON:  12/25/2017 FINDINGS: Cardiomegaly. No confluent airspace opacities or effusions. No acute bony abnormality. IMPRESSION: Cardiomegaly.  No active disease. Electronically Signed   By: Rolm Baptise M.D.   On: 01/03/2018 20:09     Medications:   . sodium chloride    . sodium chloride    . vancomycin     . aspirin EC  81 mg Oral q1800  . atorvastatin  40 mg Oral q1800  . B-complex with vitamin C  1 tablet Oral q1800  . Chlorhexidine Gluconate Cloth  6 each Topical Q0600  . clopidogrel  75 mg Oral q1800  . docusate sodium  100 mg Oral BID  . feeding supplement (NEPRO CARB  STEADY)  237 mL Oral TID BM  . feeding supplement (PRO-STAT SUGAR FREE 64)  30 mL Oral TID WC  . ferrous sulfate  325 mg Oral QPM  . gabapentin  100 mg Oral QPM  . heparin  5,000 Units Subcutaneous Q8H  . lactulose  20 g Oral QHS  . lidocaine-prilocaine  1 application Topical Q T,Th,Sa-HD  . lisinopril  5 mg Oral QPM  . metoprolol tartrate  25 mg Oral BID  . multivitamin with minerals  1 tablet Oral QPM  . polyethylene glycol  17 g Oral q1800  . polyvinyl alcohol  1 drop Left Eye QID  . senna  8.6 mg Oral BID  . sertraline  100 mg Oral QHS  . sevelamer carbonate  1,600 mg Oral TID WC  . ascorbic acid  500 mg Oral BID   sodium chloride, sodium  chloride, acetaminophen **OR** acetaminophen, bisacodyl, heparin, HYDROcodone-acetaminophen, ibuprofen, lidocaine (PF), lidocaine-prilocaine, nitroGLYCERIN, ondansetron **OR** ondansetron (ZOFRAN) IV, oxyCODONE, pentafluoroprop-tetrafluoroeth, traZODone  Assessment/ Plan:  67 y.o. male  with end stage renal disease onHemodialysis, diabetes mellitus type II, hypertension, peripheral vascular disease, coronary artery disease, hyperlipidemia, congestive heart failure, left BKA admitted on 6/23/2019for AMS with respiratory failure.   TTS [facility] left AVF EDW 90 kg Resident of [facility]  1. ESRD 2. Anemia of CKD 3. SHPTH 4. Generalized weakness 5. H/o recent MRSA bacteremia. Negative from 6/24  Plan Routine HD today Vanc x 6 weeks Till Aug 5 Low dose EPO with HD Hold binders until appetite improves    LOS: 0 Marcile [place] 7/4/201912:32 PM  [place], [place]  Note: This note was prepared with Dragon dictation. Any transcription errors are unintentional

## 2018-01-04 NOTE — Progress Notes (Signed)
Pre HD Tx    01/04/18 0949  Vital Signs  Temp 98.4 F (36.9 C)  Temp Source Oral  Pulse Rate 98  Pulse Rate Source Monitor  Resp 16  BP (!) 105/53  BP Location Right Arm  BP Method Automatic  Patient Position (if appropriate) Lying  Oxygen Therapy  SpO2 99 %  O2 Device Room Air  Pulse Oximetry Type Continuous  Pain Assessment  Pain Scale 0-10  Pain Score 0  Dialysis Weight  Weight 85.5 kg (188 lb 7.9 oz)  Type of Weight Pre-Dialysis  Time-Out for Hemodialysis  What Procedure? HD   Pt Identifiers(min of two) First/Last Name;MRN/Account#  Correct Site? Yes  Correct Side? Yes  Correct Procedure? Yes  Consents Verified? Yes  Rad Studies Available? N/A  Safety Precautions Reviewed? Yes  [employer] Number [id number]  Station Number 1  UF/Alarm Test Passed  Conductivity: Meter 14  Conductivity: Machine  14  pH 7  Reverse Osmosis Main  Normal Saline Lot Number [id number]  Dialyzer Lot Number [id number]  Disposable Set Lot Number [id number]  Machine Temperature 98.6 F (37 C)  Musician and Audible Yes  Blood Lines Intact and Secured Yes  Pre Treatment Patient Checks  Vascular access used during treatment Graft  Hepatitis B Surface Antigen Results Negative  Date Hepatitis B Surface Antigen Drawn 12/26/17  Hepatitis B Surface Antibody  (<10)  Date Hepatitis B Surface Antibody Drawn 12/26/17  Hemodialysis Consent Verified Yes  Hemodialysis Standing Orders Initiated Yes  ECG (Telemetry) Monitor On Yes  Prime Ordered Normal Saline  Length of  DialysisTreatment -hour(s) 3.5 Hour(s)  Dialysis Treatment Comments Na 140  Dialyzer Elisio 17H NR  Dialysate 3K, 2.5 Ca  Dialysis Anticoagulant None  Dialysate Flow Ordered 800  Blood Flow Rate Ordered 400 mL/min  Ultrafiltration Goal 0 Liters  Dialysis Blood Pressure Support Ordered Normal Saline  Education / Care Plan  Dialysis Education Provided Yes  Documented Education in Care Plan Yes  Fistula / Graft  Left Upper arm Arteriovenous fistula  Placement Date/Time: 12/24/17 1400   Placed prior to admission: Yes  Orientation: Left  Access Location: Upper arm  Access Type: Arteriovenous fistula  Site Condition No complications  Fistula / Graft Assessment Thrill;Bruit;Present  Status Patent  Drainage Description None

## 2018-01-04 NOTE — Clinical Social Work Note (Signed)
Clinical Social Work Assessment  Patient Details  Name: Dennis Walker MRN: [id number] Date of Birth: [birth year]  Date of referral:  01/04/18               Reason for consult:  Other (Comment Required)(From [facility] )                Permission sought to share information with:  Facility Art therapist granted to share information::  Yes, Verbal Permission Granted  Name::      [facility]   Agency::     Relationship::     Contact Information:     Housing/Transportation Living arrangements for the past 2 months:  [place] of Information:  Adult Children, Facility Patient Interpreter Needed:  None Criminal Activity/Legal Involvement Pertinent to Current Situation/Hospitalization:  No - Comment as needed Significant Relationships:  Adult Children Lives with:  Facility Resident Do you feel safe going back to the place where you live?  Yes Need for family participation in patient care:  Yes (Comment)  Care giving concerns: Patient is a long term care SNF resident at [facility].    Social Worker assessment / plan: Holiday representative (CSW) reviewed chart and noted that patient is from [facility]. Per Tammy admissions coordinator at [facility] patient is a long term care resident and can return when medically stable. CSW attempted to meet with patient however he was off the floor for dialysis. CSW contacted patient's daughter Dennis Walker to complete assessment. Per daughter patient has been at [facility] for about 1 year and goes to dialysis T,Th,Sat at [facility] in [place] at 7 am. Daughter is agreeable for patient to return to [facility]. FL2 complete. CSW will continue to follow and assist as needed.    Employment status:  Disabled (Comment on whether or not currently receiving Disability), Retired Forensic scientist:  Medicare, Medicaid In [place] PT Recommendations:  Not assessed at this time Information / Referral to community resources:  [place]  Patient/Family's Response to care:  Patient's daughter is agreeable for him to return to [facility].   Patient/Family's Understanding of and Emotional Response to Diagnosis, Current Treatment, and Prognosis:  Patient's daughter was very pleasant and thanked CSW for assistance.   Emotional Assessment Appearance:  Appears stated age Attitude/Demeanor/Rapport:  Unable to Assess Affect (typically observed):  Unable to Assess Orientation:  Oriented to Self, Oriented to Place, Fluctuating Orientation (Suspected and/or reported Sundowners) Alcohol / Substance use:  Not Applicable Psych involvement (Current and /or in the community):  No (Comment)  Discharge Needs  Concerns to be addressed:  Discharge Planning Concerns Readmission within the last 30 days:  No Current discharge risk:  Dependent with Mobility Barriers to Discharge:  Continued Medical Work up   [employer], Veronia Beets, LCSW 01/04/2018, 12:51 PM

## 2018-01-04 NOTE — Progress Notes (Signed)
Pre HD Assessment    01/04/18 0940  Neurological  Level of Consciousness Alert  Orientation Level Oriented X4  Respiratory  Respiratory Pattern Regular  Chest Assessment Chest expansion symmetrical  Bilateral Breath Sounds Clear;Diminished  Cough None  Cardiac  Pulse Regular  Heart Sounds S1, S2  ECG Monitor Yes  Vascular  R Radial Pulse +2  L Radial Pulse +2  Edema Generalized  Psychosocial  Psychosocial (WDL) WDL

## 2018-01-05 DIAGNOSIS — R0789 Other chest pain: Secondary | ICD-10-CM | POA: Diagnosis not present

## 2018-01-05 LAB — CBC
HCT: 35.8 % — ABNORMAL LOW (ref 40.0–52.0)
Hemoglobin: 11.7 g/dL — ABNORMAL LOW (ref 13.0–18.0)
MCH: 27.4 pg (ref 26.0–34.0)
MCHC: 32.8 g/dL (ref 32.0–36.0)
MCV: 83.5 fL (ref 80.0–100.0)
PLATELETS: 445 10*3/uL — AB (ref 150–440)
RBC: 4.29 MIL/uL — AB (ref 4.40–5.90)
RDW: 18.4 % — ABNORMAL HIGH (ref 11.5–14.5)
WBC: 17.2 10*3/uL — ABNORMAL HIGH (ref 3.8–10.6)

## 2018-01-05 LAB — BASIC METABOLIC PANEL
Anion gap: 14 (ref 5–15)
BUN: 40 mg/dL — AB (ref 8–23)
CO2: 26 mmol/L (ref 22–32)
Calcium: 8.3 mg/dL — ABNORMAL LOW (ref 8.9–10.3)
Chloride: 98 mmol/L (ref 98–111)
Creatinine, Ser: 5.05 mg/dL — ABNORMAL HIGH (ref 0.61–1.24)
GFR calc Af Amer: 13 mL/min — ABNORMAL LOW (ref >60)
GFR, EST NON AFRICAN AMERICAN: 11 mL/min — AB (ref >60)
GLUCOSE: 179 mg/dL — AB (ref 70–99)
POTASSIUM: 3.8 mmol/L (ref 3.5–5.1)
Sodium: 138 mmol/L (ref 135–145)

## 2018-01-05 LAB — GLUCOSE, CAPILLARY
Glucose-Capillary: 151 mg/dL — ABNORMAL HIGH (ref 70–99)
Glucose-Capillary: 170 mg/dL — ABNORMAL HIGH (ref 70–99)

## 2018-01-05 MED ORDER — INSULIN ASPART 100 UNIT/ML ~~LOC~~ SOLN: 0.0000 [IU] | Freq: Every day | SUBCUTANEOUS | Status: DC

## 2018-01-05 MED ORDER — INSULIN ASPART 100 UNIT/ML ~~LOC~~ SOLN
0.0000 [IU] | Freq: Three times a day (TID) | SUBCUTANEOUS | Status: DC
  Administered 2018-01-05 (×2): 2 [IU] via SUBCUTANEOUS
  Filled 2018-01-05 (×2): qty 1

## 2018-01-05 MED ORDER — IBUPROFEN 400 MG PO TABS: 400.0000 mg | ORAL_TABLET | Freq: Four times a day (QID) | ORAL | 0 refills | Status: DC | PRN

## 2018-01-05 MED ORDER — METOPROLOL TARTRATE 25 MG PO TABS: 12.5000 mg | ORAL_TABLET | Freq: Two times a day (BID) | ORAL | 0 refills | Status: DC

## 2018-01-05 MED ORDER — OXYCODONE HCL 5 MG PO TABS: 5.0000 mg | ORAL_TABLET | Freq: Three times a day (TID) | ORAL | 0 refills | Status: DC | PRN

## 2018-01-05 NOTE — Progress Notes (Signed)
[facility], [place] 01/05/18  Subjective:   Patient is doing well Scheduled for discharge today Tolerated dialysis well yesterday Pain control has improved   Objective:  Vital signs in last 24 hours:  Temp:  [97.5 F (36.4 C)-99.1 F (37.3 C)] 98.2 F (36.8 C) (07/05 0809) Pulse Rate:  [78-100] 93 (07/05 0809) Resp:  [11-39] 18 (07/05 0809) BP: (104-179)/(49-84) 144/73 (07/05 0809) SpO2:  [96 %-100 %] 96 % (07/05 0809) Weight:  [85.8 kg (189 lb 2.5 oz)-85.9 kg (189 lb 4.8 oz)] 85.9 kg (189 lb 4.8 oz) (07/05 0403)  Weight change: -5.673 kg (-12 lb 8.1 oz) Filed Weights   01/04/18 0949 01/04/18 1323 01/05/18 0403  Weight: 85.5 kg (188 lb 7.9 oz) 85.8 kg (189 lb 2.5 oz) 85.9 kg (189 lb 4.8 oz)    Intake/Output:    Intake/Output Summary (Last 24 hours) at 01/05/2018 1014 Last data filed at 01/05/2018 0600 Gross per 24 hour  Intake 0 ml  Output 1 ml  Net -1 ml     Physical Exam: General:  Chronically ill appearing  HEENT Moist oral mucus membranes  Neck supple  Pulm/lungs Normal effort, coarse breath sounds  CVS/Heart No rub, regular rhythm, soft systolic murmur  Abdomen:  Soft, NT  Extremities:  Right BKA, left trace edema  Neurologic: Lethargic but able to answer questions  Skin: dry  Access: AVG, left arm       Basic Metabolic Panel:  Recent Labs  Lab 01/03/18 1953 01/04/18 0544 01/05/18 0550  NA 132* 133* 138  K 4.4 3.5 3.8  CL 95* 95* 98  CO2 25 23 26   GLUCOSE 176* 159* 179*  BUN 59* 62* 40*  CREATININE 6.43* 6.75* 5.05*  CALCIUM 8.0* 8.0* 8.3*     CBC: Recent Labs  Lab 01/03/18 1953 01/04/18 0544 01/05/18 0550  WBC 16.7* 16.1* 17.2*  HGB 11.6* 10.6* 11.7*  HCT 35.3* 32.5* 35.8*  MCV 83.2 82.6 83.5  PLT 346 390 445*      Lab Results  Component Value Date   HEPBSAG Negative 12/26/2017   HEPBSAB Non Reactive 12/26/2017      Microbiology:  No results found for this or any previous visit (from the  past 240 hour(s)).  Coagulation Studies: No results for input(s): LABPROT, INR in the last 72 hours.  Urinalysis: No results for input(s): COLORURINE, LABSPEC, PHURINE, GLUCOSEU, HGBUR, BILIRUBINUR, KETONESUR, PROTEINUR, UROBILINOGEN, NITRITE, LEUKOCYTESUR in the last 72 hours.  Invalid input(s): APPERANCEUR    Imaging: Ct Cervical Spine Wo Contrast  Result Date: 01/04/2018 CLINICAL DATA:  Neck pain and unable to move. Patient unable to respond when asked what is wrong. EXAM: CT CERVICAL SPINE WITHOUT CONTRAST TECHNIQUE: Multidetector CT imaging of the cervical spine was performed without intravenous contrast. Multiplanar CT image reconstructions were also generated. COMPARISON:  None. FINDINGS: Alignment: Normal. Skull base and vertebrae: Vertebral bodies are normal in height. There is mild spondylosis throughout the cervical spine. Atlantoaxial articulation demonstrates calcification region of the annual ligament and is otherwise within normal. There is minimal uncovertebral joint spurring there is no fracture or subluxation. No significant neural foraminal narrowing. Soft tissues and spinal canal: No prevertebral fluid or swelling. No visible canal hematoma. Disc levels:  No significant disc space narrowing. Upper chest: Left subclavian vein stent. Calcified plaque at the carotid bifurcations. Other: None. IMPRESSION: No acute findings. Mild spondylosis of the cervical spine. Electronically Signed   By: Marin Olp M.D.   On: 01/04/2018 15:01  Dg Chest Portable 1 View  Result Date: 01/03/2018 CLINICAL DATA:  Chest pain EXAM: PORTABLE CHEST 1 VIEW COMPARISON:  12/25/2017 FINDINGS: Cardiomegaly. No confluent airspace opacities or effusions. No acute bony abnormality. IMPRESSION: Cardiomegaly.  No active disease. Electronically Signed   By: Rolm Baptise M.D.   On: 01/03/2018 20:09     Medications:   . vancomycin Stopped (01/04/18 1422)   . aspirin EC  81 mg Oral q1800  . atorvastatin  40  mg Oral q1800  . B-complex with vitamin C  1 tablet Oral q1800  . Chlorhexidine Gluconate Cloth  6 each Topical Q0600  . clopidogrel  75 mg Oral q1800  . docusate sodium  100 mg Oral BID  . feeding supplement (NEPRO CARB STEADY)  237 mL Oral TID BM  . feeding supplement (PRO-STAT SUGAR FREE 64)  30 mL Oral TID WC  . ferrous sulfate  325 mg Oral QPM  . gabapentin  100 mg Oral QPM  . heparin  5,000 Units Subcutaneous Q8H  . insulin aspart  0-5 Units Subcutaneous QHS  . insulin aspart  0-9 Units Subcutaneous TID WC  . lactulose  20 g Oral QHS  . lidocaine-prilocaine  1 application Topical Q T,Th,Sa-HD  . lisinopril  5 mg Oral QPM  . metoprolol tartrate  25 mg Oral BID  . multivitamin with minerals  1 tablet Oral QPM  . polyethylene glycol  17 g Oral q1800  . polyvinyl alcohol  1 drop Left Eye QID  . senna  8.6 mg Oral BID  . sertraline  100 mg Oral QHS  . ascorbic acid  500 mg Oral BID   acetaminophen **OR** acetaminophen, bisacodyl, diphenhydrAMINE, HYDROcodone-acetaminophen, ibuprofen, nitroGLYCERIN, ondansetron **OR** ondansetron (ZOFRAN) IV, oxyCODONE, traZODone  Assessment/ Plan:  67 y.o. male  with end stage renal disease onHemodialysis, diabetes mellitus type II, hypertension, peripheral vascular disease, coronary artery disease, hyperlipidemia, congestive heart failure, left BKA admitted on 6/23/2019for AMS with respiratory failure.   TTS [facility] left AVF EDW 90 kg Resident of [facility]  1.  Anemia of CKD 2. ESRD 3. SHPTH 4. Generalized weakness 5. H/o recent MRSA bacteremia. Negative from 6/24  Plan Report given with hemodialysis yesterday Vanc x 6 weeks Till Aug 5 May resume binders once appetite improves    LOS: 0 Estephanie Hubbs Candiss Norse 7/5/201910:14 AM  [place], [place]  Note: This note was prepared with Dragon dictation. Any transcription errors are unintentional

## 2018-01-05 NOTE — Progress Notes (Signed)
Ems here to transport patient.  Daughter notified of transport.

## 2018-01-05 NOTE — Progress Notes (Signed)
Report called to [place] at [facility].  Mr. Gunn is known to her.

## 2018-01-05 NOTE — Progress Notes (Signed)
EMS called for transport.

## 2018-01-05 NOTE — Progress Notes (Signed)
[place] given just before 0700.  Patient states his pain remains at 7 of 10.  Unable to remain awake long enough to answer orientation questions.  Tells me it is July but falls asleep before he can tell me what year it is.

## 2018-01-05 NOTE — Clinical Social Work Note (Signed)
Patient is medically ready for discharge today. CSW contacted patient's daughter Theodoro Grist [phone]. Patient will return to [facility] today via EMS. [facility] paperwork to [facility]. CSW contacted Tammy at [facility] and notified of discharge today. RN will call report and call for transport.   [place], [place]

## 2018-01-05 NOTE — Discharge Summary (Signed)
[facility] [place] NAME: Dennis Walker    MR#:  [id number]  DATE OF BIRTH:  06/16/1951  DATE OF ADMISSION:  01/03/2018 ADMITTING PHYSICIAN: Epifanio Lesches, MD  DATE OF DISCHARGE: 01/05/2018  PRIMARY CARE PHYSICIAN: Juluis Pitch, MD    ADMISSION DIAGNOSIS:  Chest pain, unspecified type [R07.9]  DISCHARGE DIAGNOSIS:  Active Problems:   Chest pain, mid sternal   SECONDARY DIAGNOSIS:   Past Medical History:  Diagnosis Date  . Anginal pain ([place])   . Anxiety   . Arthritis   . Asthma   . CHF (congestive heart failure) ([place])   . Coronary artery disease   . Depression   . Diabetes mellitus without complication ([place])   . Dyspnea   . ESRD (end stage renal disease) ([place])    On Tuesday, Thursday and Saturday dialysis  . Heart murmur   . Hyperlipidemia   . Hypertension   . Myocardial infarction ([place])   . Peripheral vascular disease ([place])   . Polio    childhood  . Stroke [facility])     HOSPITAL COURSE:   1.  Chest pain with borderline troponin.  Third troponin trended down and the patient not currently having chest pain.  The patient's pain was more generalized throughout the body.  PRN ibuprofen ordered.  Patient on aspirin, Plavix and metoprolol 2.  Recent sepsis and MRSA bacteremia.  On IV vancomycin with dialysis for a total of 6 weeks.  Patient received vancomycin yesterday with dialysis.  Follow-up white blood cell count. 3.  End-stage renal disease on hemodialysis as per nephrology 4.  Essential hypertension on metoprolol and lisinopril 5.  Impaired fasting glucose.  Last hemoglobin A1c 6.4.  Continue to monitor 6.  Hyperlipidemia unspecified on atorvastatin.  If the patient continues to have generalized pains can consider stopping the atorvastatin 7.  Neuropathy on gabapentin 8.  Body aches and neck pain.  Cervical spine CT scan negative.  PRN ibuprofen  DISCHARGE CONDITIONS:   Fair  CONSULTS OBTAINED:  Treatment Team:   Murlean Iba, MD  DRUG ALLERGIES:   Allergies  Allergen Reactions  . Cefuroxime Itching  . Septra Ds  [Sulfamethoxazole-Trimethoprim]     Other reaction(s): Itching  . Shrimp [Shellfish Allergy] Other (See Comments)    Per mar. Unknown reaction   . Sulfa Antibiotics Other (See Comments)    Per mar. Unknown reaction    DISCHARGE MEDICATIONS:   Allergies as of 01/05/2018      Reactions   Cefuroxime Itching   Septra Ds  [sulfamethoxazole-trimethoprim]    Other reaction(s): Itching   Shrimp [shellfish Allergy] Other (See Comments)   Per mar. Unknown reaction   Sulfa Antibiotics Other (See Comments)   Per mar. Unknown reaction      Medication List    STOP taking these medications   RENVELA 800 MG tablet Generic drug:  sevelamer carbonate     TAKE these medications   acetaminophen 500 MG tablet Commonly known as:  TYLENOL Take 500 mg by mouth every 4 (four) hours as needed (pain).   ascorbic acid 500 MG tablet Commonly known as:  VITAMIN C Take 500 mg by mouth 2 (two) times daily.   aspirin EC 81 MG tablet Take 81 mg by mouth daily at 6 PM.   atorvastatin 20 MG tablet Commonly known as:  LIPITOR Take 40 mg by mouth daily at 6 PM.   b complex vitamins capsule Take 1 capsule by mouth daily at 6  PM.   clopidogrel 75 MG tablet Commonly known as:  PLAVIX Take 75 mg by mouth daily at 6 PM.   eucerin cream Apply 1 application topically 2 (two) times daily as needed (for pruritus).   feeding supplement (NEPRO CARB STEADY) Liqd Take 237 mLs by mouth 3 (three) times daily between meals.   feeding supplement (PRO-STAT SUGAR FREE 64) Liqd Take 30 mLs by mouth 3 (three) times daily with meals.   ferrous sulfate 325 (65 FE) MG tablet Take 325 mg by mouth every evening.   gabapentin 100 MG capsule Commonly known as:  NEURONTIN Take 1 capsule (100 mg total) by mouth every evening.   ibuprofen 400 MG tablet Commonly known as:  ADVIL,MOTRIN Take 1 tablet (400 mg  total) by mouth every 6 (six) hours as needed for mild pain or moderate pain.   lactulose 10 GM/15ML solution Commonly known as:  CHRONULAC Take 20 g by mouth at bedtime.   lidocaine-prilocaine cream Commonly known as:  EMLA Apply 1 application topically Every Tuesday,Thursday,and Saturday with dialysis.   lisinopril 5 MG tablet Commonly known as:  PRINIVIL,ZESTRIL Take 5 mg by mouth every evening.   metoprolol tartrate 25 MG tablet Commonly known as:  LOPRESSOR Take 0.5 tablets (12.5 mg total) by mouth 2 (two) times daily.   multivitamin tablet Take 1 tablet by mouth every evening.   nitroGLYCERIN 0.4 MG SL tablet Commonly known as:  NITROSTAT Place 0.4 mg under the tongue every 5 (five) minutes as needed for chest pain.   ORAJEL MAXIMUM STRENGTH 20 % Gel Generic drug:  benzocaine Use as directed 1 application in the mouth or throat 4 (four) times daily as needed (pain).   oxyCODONE 5 MG immediate release tablet Commonly known as:  Oxy IR/ROXICODONE Take 1 tablet (5 mg total) by mouth 3 (three) times daily as needed for severe pain.   polyethylene glycol packet Commonly known as:  MIRALAX / GLYCOLAX Take 17 g by mouth daily at 6 PM.   senna 8.6 MG Tabs tablet Commonly known as:  SENOKOT Take 8.6 mg by mouth 2 (two) times daily.   sertraline 100 MG tablet Commonly known as:  ZOLOFT Take 100 mg by mouth at bedtime.   SYSTANE ULTRA PF 0.4-0.3 % Soln Generic drug:  Polyethyl Glyc-Propyl Glyc PF Place 1 drop into the left eye 4 (four) times daily.   Vancomycin 750-5 MG/150ML-% Soln Commonly known as:  VANCOCIN Inject 150 mLs (750 mg total) into the vein 3 (three) times a week. With HD.        DISCHARGE INSTRUCTIONS:    Follow-up with Dr. at facility 1 day  If you experience worsening of your admission symptoms, develop shortness of breath, life threatening emergency, suicidal or homicidal thoughts you must seek medical attention immediately by calling 911 or  calling your MD immediately  if symptoms less severe.  You Must read complete instructions/literature along with all the possible adverse reactions/side effects for all the Medicines you take and that have been prescribed to you. Take any new Medicines after you have completely understood and accept all the possible adverse reactions/side effects.   Please note  You were cared for by a hospitalist during your hospital stay. If you have any questions about your discharge medications or the care you received while you were in the hospital after you are discharged, you can call the unit and asked to speak with the hospitalist on call if the hospitalist that took care of you is  not available. Once you are discharged, your primary care physician will handle any further medical issues. Please note that NO REFILLS for any discharge medications will be authorized once you are discharged, as it is imperative that you return to your primary care physician (or establish a relationship with a primary care physician if you do not have one) for your aftercare needs so that they can reassess your need for medications and monitor your lab values.    Today   CHIEF COMPLAINT:   Chief Complaint  Patient presents with  . Chest Pain    HISTORY OF PRESENT ILLNESS:  Dennis Walker  is a 67 y.o. male presented with chest pain   VITAL SIGNS:  Blood pressure (!) 144/73, pulse 93, temperature 98.2 F (36.8 C), resp. rate 18, height 5\' 11"  (1.803 m), weight 85.9 kg (189 lb 4.8 oz), SpO2 96 %.   PHYSICAL EXAMINATION:  GENERAL:  67 y.o.-year-old patient lying in the bed with no acute distress.  EYES:  left eye reactive to light.  No scleral icterus.  HEENT: Head atraumatic, normocephalic. Oropharynx and nasopharynx clear.  NECK:  Supple, no jugular venous distention. No thyroid enlargement, no tenderness.  LUNGS: Normal breath sounds bilaterally, no wheezing, rales,rhonchi or crepitation. No use of accessory muscles  of respiration.  CARDIOVASCULAR: S1, S2 normal. No murmurs, rubs, or gallops.  ABDOMEN: Soft, non-tender, non-distended. Bowel sounds present. No organomegaly or mass.  EXTREMITIES: No pedal edema, cyanosis, or clubbing.  NEUROLOGIC: Cranial nerves II through XII are intact. Muscle strength 5/5 in all extremities. Sensation intact. PSYCHIATRIC: The patient is alert and answers questions.  SKIN:  left leg chronic discoloration  DATA REVIEW:   CBC Recent Labs  Lab 01/05/18 0550  WBC 17.2*  HGB 11.7*  HCT 35.8*  PLT 445*    Chemistries  Recent Labs  Lab 01/05/18 0550  NA 138  K 3.8  CL 98  CO2 26  GLUCOSE 179*  BUN 40*  CREATININE 5.05*  CALCIUM 8.3*    Cardiac Enzymes Recent Labs  Lab 01/04/18 1412  TROPONINI 0.21*     RADIOLOGY:  Ct Cervical Spine Wo Contrast  Result Date: 01/04/2018 CLINICAL DATA:  Neck pain and unable to move. Patient unable to respond when asked what is wrong. EXAM: CT CERVICAL SPINE WITHOUT CONTRAST TECHNIQUE: Multidetector CT imaging of the cervical spine was performed without intravenous contrast. Multiplanar CT image reconstructions were also generated. COMPARISON:  None. FINDINGS: Alignment: Normal. Skull base and vertebrae: Vertebral bodies are normal in height. There is mild spondylosis throughout the cervical spine. Atlantoaxial articulation demonstrates calcification region of the annual ligament and is otherwise within normal. There is minimal uncovertebral joint spurring there is no fracture or subluxation. No significant neural foraminal narrowing. Soft tissues and spinal canal: No prevertebral fluid or swelling. No visible canal hematoma. Disc levels:  No significant disc space narrowing. Upper chest: Left subclavian vein stent. Calcified plaque at the carotid bifurcations. Other: None. IMPRESSION: No acute findings. Mild spondylosis of the cervical spine. Electronically Signed   By: Marin Olp M.D.   On: 01/04/2018 15:01   Dg Chest  Portable 1 View  Result Date: 01/03/2018 CLINICAL DATA:  Chest pain EXAM: PORTABLE CHEST 1 VIEW COMPARISON:  12/25/2017 FINDINGS: Cardiomegaly. No confluent airspace opacities or effusions. No acute bony abnormality. IMPRESSION: Cardiomegaly.  No active disease. Electronically Signed   By: Rolm Baptise M.D.   On: 01/03/2018 20:09     Management plans discussed with the patient, family and  they are in agreement.  CODE STATUS:     Code Status Orders  (From admission, onward)        Start     Ordered   01/03/18 2056  Full code  Continuous     01/03/18 2057    Code Status History    Date Active Date Inactive Code Status Order ID Comments User Context   12/24/2017 1359 12/29/2017 1957 Full Code [id number]  Demetrios Loll, MD Inpatient   08/05/2017 1756 08/06/2017 1829 Full Code [id number]  Bettey Costa, MD Inpatient   02/02/2017 1830 02/05/2017 1825 Full Code [id number]  Henreitta Leber, MD Inpatient    Advance Directive Documentation     Most Recent Value  Type of Advance Directive  Healthcare Power of Attorney  Pre-existing out of facility DNR order (yellow form or pink MOST form)  -  "MOST" Form in Place?  -      TOTAL TIME TAKING CARE OF THIS PATIENT: 35 minutes.    Loletha Grayer M.D on 01/05/2018 at 8:38 AM  Between 7am to 6pm - Pager - [phone]  After 6pm go to [email or web address] - password [email or web address]  [phone]  CC: Primary care physician; Juluis Pitch, MD

## 2018-01-23 ENCOUNTER — Encounter: Payer: Self-pay | Admitting: Emergency Medicine

## 2018-01-23 ENCOUNTER — Emergency Department
Admission: EM | Admit: 2018-01-23 | Discharge: 2018-01-23 | Disposition: A | Discharge status: Home / Self Care | Payer: Medicare Other | Attending: Emergency Medicine | Admitting: Emergency Medicine

## 2018-01-23 ENCOUNTER — Emergency Department: Payer: Medicare Other

## 2018-01-23 DIAGNOSIS — X509XXA Other and unspecified overexertion or strenuous movements or postures, initial encounter: Secondary | ICD-10-CM | POA: Insufficient documentation

## 2018-01-23 DIAGNOSIS — S161XXA Strain of muscle, fascia and tendon at neck level, initial encounter: Secondary | ICD-10-CM

## 2018-01-23 DIAGNOSIS — Z79899 Other long term (current) drug therapy: Secondary | ICD-10-CM | POA: Insufficient documentation

## 2018-01-23 DIAGNOSIS — Z955 Presence of coronary angioplasty implant and graft: Secondary | ICD-10-CM | POA: Insufficient documentation

## 2018-01-23 DIAGNOSIS — J45909 Unspecified asthma, uncomplicated: Secondary | ICD-10-CM | POA: Diagnosis not present

## 2018-01-23 DIAGNOSIS — E1122 Type 2 diabetes mellitus with diabetic chronic kidney disease: Secondary | ICD-10-CM | POA: Insufficient documentation

## 2018-01-23 DIAGNOSIS — N186 End stage renal disease: Secondary | ICD-10-CM | POA: Insufficient documentation

## 2018-01-23 DIAGNOSIS — Y999 Unspecified external cause status: Secondary | ICD-10-CM | POA: Diagnosis not present

## 2018-01-23 DIAGNOSIS — Y939 Activity, unspecified: Secondary | ICD-10-CM | POA: Diagnosis not present

## 2018-01-23 DIAGNOSIS — Y929 Unspecified place or not applicable: Secondary | ICD-10-CM | POA: Insufficient documentation

## 2018-01-23 DIAGNOSIS — S199XXA Unspecified injury of neck, initial encounter: Secondary | ICD-10-CM | POA: Diagnosis present

## 2018-01-23 DIAGNOSIS — I509 Heart failure, unspecified: Secondary | ICD-10-CM | POA: Diagnosis not present

## 2018-01-23 DIAGNOSIS — Z7982 Long term (current) use of aspirin: Secondary | ICD-10-CM | POA: Diagnosis not present

## 2018-01-23 DIAGNOSIS — Z7902 Long term (current) use of antithrombotics/antiplatelets: Secondary | ICD-10-CM | POA: Diagnosis not present

## 2018-01-23 DIAGNOSIS — I132 Hypertensive heart and chronic kidney disease with heart failure and with stage 5 chronic kidney disease, or end stage renal disease: Secondary | ICD-10-CM | POA: Insufficient documentation

## 2018-01-23 LAB — COMPREHENSIVE METABOLIC PANEL
ALBUMIN: 3 g/dL — AB (ref 3.5–5.0)
ALT: 13 U/L (ref 0–44)
AST: 31 U/L (ref 15–41)
Alkaline Phosphatase: 135 U/L — ABNORMAL HIGH (ref 38–126)
Anion gap: 8 (ref 5–15)
BUN: 45 mg/dL — AB (ref 8–23)
CHLORIDE: 94 mmol/L — AB (ref 98–111)
CO2: 29 mmol/L (ref 22–32)
Calcium: 8.7 mg/dL — ABNORMAL LOW (ref 8.9–10.3)
Creatinine, Ser: 5.58 mg/dL — ABNORMAL HIGH (ref 0.61–1.24)
GFR calc Af Amer: 11 mL/min — ABNORMAL LOW (ref >60)
GFR calc non Af Amer: 10 mL/min — ABNORMAL LOW (ref >60)
GLUCOSE: 185 mg/dL — AB (ref 70–99)
POTASSIUM: 4.2 mmol/L (ref 3.5–5.1)
SODIUM: 131 mmol/L — AB (ref 135–145)
Total Bilirubin: 1 mg/dL (ref 0.3–1.2)
Total Protein: 7.8 g/dL (ref 6.5–8.1)

## 2018-01-23 LAB — CBC
HCT: 32.6 % — ABNORMAL LOW (ref 40.0–52.0)
Hemoglobin: 10.7 g/dL — ABNORMAL LOW (ref 13.0–18.0)
MCH: 27.7 pg (ref 26.0–34.0)
MCHC: 32.9 g/dL (ref 32.0–36.0)
MCV: 84.2 fL (ref 80.0–100.0)
Platelets: 258 10*3/uL (ref 150–440)
RBC: 3.88 MIL/uL — AB (ref 4.40–5.90)
RDW: 18.9 % — ABNORMAL HIGH (ref 11.5–14.5)
WBC: 8.6 10*3/uL (ref 3.8–10.6)

## 2018-01-23 LAB — TROPONIN I: TROPONIN I: 0.07 ng/mL — AB (ref <0.03)

## 2018-01-23 MED ORDER — ONDANSETRON HCL 4 MG/2ML IJ SOLN
4.0000 mg | Freq: Once | INTRAMUSCULAR | Status: AC
  Administered 2018-01-23: 4 mg via INTRAVENOUS
  Filled 2018-01-23: qty 2

## 2018-01-23 MED ORDER — MORPHINE SULFATE (PF) 4 MG/ML IV SOLN
4.0000 mg | Freq: Once | INTRAVENOUS | Status: AC
  Administered 2018-01-23: 4 mg via INTRAVENOUS
  Filled 2018-01-23: qty 1

## 2018-01-23 MED ORDER — MORPHINE SULFATE (PF) 4 MG/ML IV SOLN
INTRAVENOUS | Status: AC
  Administered 2018-01-23: 4 mg via INTRAVENOUS
  Filled 2018-01-23: qty 1

## 2018-01-23 MED ORDER — MORPHINE SULFATE (PF) 4 MG/ML IV SOLN
4.0000 mg | Freq: Once | INTRAVENOUS | Status: AC
  Administered 2018-01-23: 4 mg via INTRAVENOUS

## 2018-01-23 NOTE — ED Notes (Signed)
[facility] arrived to pick up pt. Pt placed in a wheelchair and in NAD. Pt taken to dialysis with discharge papers and belongings.

## 2018-01-23 NOTE — ED Triage Notes (Addendum)
Pt arrived from [facility] with complaints of bilateral shoulder pain that radiated to his back. Pt states the pain started "maybe 4 or 5 weeks ago," but last night the pain increased in intensity. Pt did not receive dialysis treatment.
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
## 2018-01-23 NOTE — ED Notes (Signed)
RN called [facility] to request a cab to take pt to Dialysis. [facility] reports they will not be able to pick up the patient for the next 45 minutes - 1 hour. RN called [facility] to confirm they could still see pt that late.

## 2018-01-23 NOTE — ED Notes (Signed)
Date and time results received: 01/23/18 0820 (use smartphrase ".now" to insert current time)  Test: troponin Critical Value: 0.07  Name of Provider Notified: Dr. Corky Downs

## 2018-01-29 ENCOUNTER — Other Ambulatory Visit (INDEPENDENT_AMBULATORY_CARE_PROVIDER_SITE_OTHER): Payer: Self-pay | Admitting: Vascular Surgery

## 2018-01-29 ENCOUNTER — Ambulatory Visit (INDEPENDENT_AMBULATORY_CARE_PROVIDER_SITE_OTHER): Payer: Medicare Other | Admitting: Vascular Surgery

## 2018-01-29 ENCOUNTER — Ambulatory Visit (INDEPENDENT_AMBULATORY_CARE_PROVIDER_SITE_OTHER): Payer: Medicare Other

## 2018-01-29 ENCOUNTER — Encounter (INDEPENDENT_AMBULATORY_CARE_PROVIDER_SITE_OTHER): Payer: Self-pay | Admitting: Vascular Surgery

## 2018-01-29 ENCOUNTER — Encounter (INDEPENDENT_AMBULATORY_CARE_PROVIDER_SITE_OTHER): Payer: Self-pay

## 2018-01-29 VITALS — BP 202/94 | HR 75 | Resp 16 | Ht 71.0 in | Wt 203.0 lb

## 2018-01-29 DIAGNOSIS — I871 Compression of vein: Secondary | ICD-10-CM

## 2018-01-29 DIAGNOSIS — T829XXS Unspecified complication of cardiac and vascular prosthetic device, implant and graft, sequela: Secondary | ICD-10-CM

## 2018-01-29 DIAGNOSIS — I70213 Atherosclerosis of native arteries of extremities with intermittent claudication, bilateral legs: Secondary | ICD-10-CM

## 2018-01-29 DIAGNOSIS — N186 End stage renal disease: Secondary | ICD-10-CM
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
## 2018-01-30 ENCOUNTER — Encounter (INDEPENDENT_AMBULATORY_CARE_PROVIDER_SITE_OTHER): Payer: Self-pay

## 2018-01-31 ENCOUNTER — Encounter (INDEPENDENT_AMBULATORY_CARE_PROVIDER_SITE_OTHER): Payer: Self-pay | Admitting: Vascular Surgery

## 2018-02-05 ENCOUNTER — Other Ambulatory Visit (INDEPENDENT_AMBULATORY_CARE_PROVIDER_SITE_OTHER): Payer: Self-pay | Admitting: Vascular Surgery

## 2018-02-06 MED ORDER — CLINDAMYCIN PHOSPHATE 300 MG/50ML IV SOLN
300.0000 mg | Freq: Once | INTRAVENOUS | Status: AC
  Administered 2018-02-07: 300 mg via INTRAVENOUS

## 2018-02-07 ENCOUNTER — Encounter: Payer: Self-pay | Admitting: *Deleted

## 2018-02-07 ENCOUNTER — Encounter
Admission: RE | Disposition: A | Discharge status: Skilled Nursing Facility | Payer: Self-pay | Source: Ambulatory Visit | Attending: Vascular Surgery

## 2018-02-07 ENCOUNTER — Ambulatory Visit
Admission: RE | Admit: 2018-02-07 | Discharge: 2018-02-07 | Disposition: A | Discharge status: Skilled Nursing Facility | Payer: Medicare Other | Source: Ambulatory Visit | Attending: Vascular Surgery | Admitting: Vascular Surgery

## 2018-02-07 DIAGNOSIS — I132 Hypertensive heart and chronic kidney disease with heart failure and with stage 5 chronic kidney disease, or end stage renal disease: Secondary | ICD-10-CM | POA: Diagnosis not present

## 2018-02-07 DIAGNOSIS — Z882 Allergy status to sulfonamides status: Secondary | ICD-10-CM | POA: Diagnosis not present

## 2018-02-07 DIAGNOSIS — E1122 Type 2 diabetes mellitus with diabetic chronic kidney disease: Secondary | ICD-10-CM | POA: Diagnosis not present

## 2018-02-07 DIAGNOSIS — Z8612 Personal history of poliomyelitis: Secondary | ICD-10-CM | POA: Insufficient documentation

## 2018-02-07 DIAGNOSIS — I871 Compression of vein: Secondary | ICD-10-CM | POA: Diagnosis not present

## 2018-02-07 DIAGNOSIS — N186 End stage renal disease: Secondary | ICD-10-CM | POA: Insufficient documentation

## 2018-02-07 DIAGNOSIS — Z8673 Personal history of transient ischemic attack (TIA), and cerebral infarction without residual deficits: Secondary | ICD-10-CM | POA: Diagnosis not present

## 2018-02-07 DIAGNOSIS — Z955 Presence of coronary angioplasty implant and graft: Secondary | ICD-10-CM | POA: Insufficient documentation

## 2018-02-07 DIAGNOSIS — Y832 Surgical operation with anastomosis, bypass or graft as the cause of abnormal reaction of the patient, or of later complication, without mention of misadventure at the time of the procedure: Secondary | ICD-10-CM | POA: Insufficient documentation

## 2018-02-07 DIAGNOSIS — I252 Old myocardial infarction: Secondary | ICD-10-CM | POA: Insufficient documentation

## 2018-02-07 DIAGNOSIS — M1990 Unspecified osteoarthritis, unspecified site: Secondary | ICD-10-CM | POA: Insufficient documentation

## 2018-02-07 DIAGNOSIS — Z881 Allergy status to other antibiotic agents status: Secondary | ICD-10-CM | POA: Diagnosis not present

## 2018-02-07 DIAGNOSIS — T82858A Stenosis of vascular prosthetic devices, implants and grafts, initial encounter: Secondary | ICD-10-CM | POA: Insufficient documentation

## 2018-02-07 DIAGNOSIS — Z91013 Allergy to seafood: Secondary | ICD-10-CM | POA: Insufficient documentation

## 2018-02-07 DIAGNOSIS — I509 Heart failure, unspecified: Secondary | ICD-10-CM | POA: Diagnosis not present

## 2018-02-07 DIAGNOSIS — F1411 Cocaine abuse, in remission: Secondary | ICD-10-CM | POA: Insufficient documentation

## 2018-02-07 DIAGNOSIS — Z823 Family history of stroke: Secondary | ICD-10-CM | POA: Diagnosis not present

## 2018-02-07 DIAGNOSIS — Z9889 Other specified postprocedural states: Secondary | ICD-10-CM | POA: Diagnosis not present

## 2018-02-07 DIAGNOSIS — Z992 Dependence on renal dialysis: Secondary | ICD-10-CM | POA: Insufficient documentation

## 2018-02-07 DIAGNOSIS — Z89511 Acquired absence of right leg below knee: Secondary | ICD-10-CM | POA: Insufficient documentation

## 2018-02-07 DIAGNOSIS — E785 Hyperlipidemia, unspecified: Secondary | ICD-10-CM | POA: Insufficient documentation

## 2018-02-07 DIAGNOSIS — T82868A Thrombosis of vascular prosthetic devices, implants and grafts, initial encounter: Secondary | ICD-10-CM

## 2018-02-07 HISTORY — PX: A/V FISTULAGRAM: CATH118298

## 2018-02-07 LAB — GLUCOSE, CAPILLARY
GLUCOSE-CAPILLARY: 145 mg/dL — AB (ref 70–99)
Glucose-Capillary: 136 mg/dL — ABNORMAL HIGH (ref 70–99)

## 2018-02-07 LAB — POTASSIUM (ARMC VASCULAR LAB ONLY): POTASSIUM (ARMC VASCULAR LAB): 3.3 — AB (ref 3.5–5.1)

## 2018-02-07 SURGERY — A/V FISTULAGRAM
Anesthesia: Moderate Sedation | Laterality: Left

## 2018-02-07 MED ORDER — ONDANSETRON HCL 4 MG/2ML IJ SOLN: 4.0000 mg | Freq: Four times a day (QID) | INTRAMUSCULAR | Status: DC | PRN

## 2018-02-07 MED ORDER — CLINDAMYCIN PHOSPHATE 300 MG/50ML IV SOLN
INTRAVENOUS | Status: AC
  Administered 2018-02-07: 300 mg via INTRAVENOUS
  Filled 2018-02-07: qty 50

## 2018-02-07 MED ORDER — METHYLPREDNISOLONE SODIUM SUCC 125 MG IJ SOLR
125.0000 mg | INTRAMUSCULAR | Status: DC | PRN
  Administered 2018-02-07: 125 mg via INTRAVENOUS

## 2018-02-07 MED ORDER — FENTANYL CITRATE (PF) 100 MCG/2ML IJ SOLN
INTRAMUSCULAR | Status: AC
  Filled 2018-02-07: qty 2

## 2018-02-07 MED ORDER — MIDAZOLAM HCL 2 MG/2ML IJ SOLN
INTRAMUSCULAR | Status: DC | PRN
  Administered 2018-02-07: 2 mg via INTRAVENOUS

## 2018-02-07 MED ORDER — FENTANYL CITRATE (PF) 100 MCG/2ML IJ SOLN
INTRAMUSCULAR | Status: DC | PRN
  Administered 2018-02-07: 50 ug via INTRAVENOUS

## 2018-02-07 MED ORDER — HYDROMORPHONE HCL 1 MG/ML IJ SOLN: 1.0000 mg | Freq: Once | INTRAMUSCULAR | Status: DC | PRN

## 2018-02-07 MED ORDER — IOPAMIDOL (ISOVUE-300) INJECTION 61%
INTRAVENOUS | Status: DC | PRN
  Administered 2018-02-07: 40 mL via INTRA_ARTERIAL

## 2018-02-07 MED ORDER — METHYLPREDNISOLONE SODIUM SUCC 125 MG IJ SOLR
INTRAMUSCULAR | Status: AC
  Administered 2018-02-07: 125 mg via INTRAVENOUS
  Filled 2018-02-07: qty 2

## 2018-02-07 MED ORDER — FAMOTIDINE 20 MG PO TABS
40.0000 mg | ORAL_TABLET | ORAL | Status: DC | PRN
  Administered 2018-02-07: 40 mg via ORAL

## 2018-02-07 MED ORDER — HEPARIN SODIUM (PORCINE) 1000 UNIT/ML IJ SOLN
INTRAMUSCULAR | Status: AC
  Filled 2018-02-07: qty 1

## 2018-02-07 MED ORDER — FAMOTIDINE 20 MG PO TABS
ORAL_TABLET | ORAL | Status: AC
  Administered 2018-02-07: 40 mg via ORAL
  Filled 2018-02-07: qty 2

## 2018-02-07 MED ORDER — SODIUM CHLORIDE 0.9 % IV SOLN
INTRAVENOUS | Status: DC
  Administered 2018-02-07: 08:00:00 via INTRAVENOUS

## 2018-02-07 MED ORDER — HEPARIN SODIUM (PORCINE) 1000 UNIT/ML IJ SOLN
INTRAMUSCULAR | Status: DC | PRN
  Administered 2018-02-07: 3000 [IU] via INTRAVENOUS

## 2018-02-07 MED ORDER — MIDAZOLAM HCL 5 MG/5ML IJ SOLN
INTRAMUSCULAR | Status: AC
  Filled 2018-02-07: qty 5

## 2018-02-07 SURGICAL SUPPLY — 22 items
BALLN DORADO 6X40X80 (BALLOONS) ×3
BALLN DORADO 8X40X80 (BALLOONS) ×3
BALLN DORADO 9X20X80 (BALLOONS) ×3
BALLN LUTONIX AV 10X40X75 (BALLOONS) ×3
BALLOON DORADO 6X40X80 (BALLOONS) ×1 IMPLANT
BALLOON DORADO 8X40X80 (BALLOONS) ×1 IMPLANT
BALLOON DORADO 9X20X80 (BALLOONS) ×1 IMPLANT
BALLOON LUTONIX AV 10X40X75 (BALLOONS) ×1 IMPLANT
COVER PROBE U/S 5X48 (MISCELLANEOUS) ×3 IMPLANT
DEVICE PRESTO INFLATION (MISCELLANEOUS) ×3 IMPLANT
DEVICE TORQUE .025-.038 (MISCELLANEOUS) ×3 IMPLANT
DRAPE BRACHIAL (DRAPES) ×3 IMPLANT
GLIDECATH ANGL TAPER 5F (CATHETERS) ×3 IMPLANT
GUIDEWIRE ANGLED .035 180CM (WIRE) ×3 IMPLANT
NEEDLE ENTRY 21GA 7CM ECHOTIP (NEEDLE) ×3 IMPLANT
PACK ANGIOGRAPHY (CUSTOM PROCEDURE TRAY) ×3 IMPLANT
SET INTRO CAPELLA COAXIAL (SET/KITS/TRAYS/PACK) ×3 IMPLANT
SHEATH BRITE TIP 6FRX5.5 (SHEATH) ×3 IMPLANT
SHEATH BRITE TIP 7FRX5.5 (SHEATH) ×6 IMPLANT
STENT COVERA STRAIGHT8X60X80 (Permanent Stent) ×3 IMPLANT
TOWEL OR 17X26 4PK STRL BLUE (TOWEL DISPOSABLE) ×3 IMPLANT
WIRE MAGIC TORQUE 260C (WIRE) ×3 IMPLANT

## 2018-02-07 NOTE — H&P (Signed)
[facility] History & Physical Update  The patient was interviewed and re-examined.  The patient's previous History and Physical has been reviewed and is unchanged.  There is no change in the plan of care. We plan to proceed with the scheduled procedure.  Hortencia Pilar, MD  02/07/2018, 8:08 AM

## 2018-02-07 NOTE — Op Note (Signed)
OPERATIVE NOTE   PROCEDURE: 1. Contrast injection left brachial axillary AV access 2. Percutaneous transluminal angioplasty and stent placement peripheral segment to 8 mm 3. Percutaneous transluminal angioplasty central venous segment to 10 mm with a Lutonix drug-eluting balloon  PRE-OPERATIVE DIAGNOSIS: Complication of dialysis access                                                       End Stage Renal Disease  POST-OPERATIVE DIAGNOSIS: same as above   SURGEON: Katha Cabal, M.D.  ANESTHESIA: Conscious sedation was administered under my direct supervision by the interventional radiology RN. IV Versed plus fentanyl were utilized. Continuous ECG, pulse oximetry and blood pressure was monitored throughout the entire procedure.  Conscious sedation was for a total of 50.  ESTIMATED BLOOD LOSS: minimal  FINDING(S): Stricture of the AV graft within the peripheral segment as well as a stricture within the central venous portion  SPECIMEN(S):  None  CONTRAST: 40 cc  FLUOROSCOPY TIME: 7.3 minutes  INDICATIONS: Dennis Walker is a 67 y.o. male who  presents with malfunctioning left arm AV access.  The patient is scheduled for angiography with possible intervention of the AV access.  The patient is aware the risks include but are not limited to: bleeding, infection, thrombosis of the cannulated access, and possible anaphylactic reaction to the contrast.  The patient acknowledges if the access can not be salvaged a tunneled catheter will be needed and will be placed during this procedure.  The patient is aware of the risks of the procedure and elects to proceed with the angiogram and intervention.  DESCRIPTION: After full informed written consent was obtained, the patient was brought back to the Special Procedure suite and placed supine position.  Appropriate cardiopulmonary monitors were placed.  The left arm was prepped and draped in the standard fashion.  Appropriate timeout is called.  The left brachial axillary AV graft was cannulated with a micropuncture needle.  Cannulation was performed with ultrasound guidance. Ultrasound was placed in a sterile sleeve, the AV access was interrogated and noted to be echolucent and compressible indicating patency. Image was recorded for the permanent record. The puncture is performed under continuous ultrasound visualization.   The microwire was advanced and the needle was exchanged for  a microsheath.  The J-wire was then advanced and a 6 Fr sheath inserted.  Hand injections were completed to image the access from the arterial anastomosis through the entire access.  The central venous structures were also imaged by hand injections.  Based on the images, there is a 80 to 90% stenosis within the stent that has been previously placed in the subclavian vein.  The innominate and superior vena cava are widely patent.  The venous outflow and axillary vein has been stented and this is patent.  The midportion of the graft is patent.  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  Closer to the anastomosis the system is widely patent.  3000 units of heparin was given and a wire was negotiated through the strictures within the venous portion of the graft as well as the central stenosis. The sheath was then upsized to a 7 [place] sheath.  An 8 mm x 40 mm Dorado balloon was  used.  Inflation was to 20 atm for 1 minute.  Following this a 10 mm x 40 mm Lutonix drug-eluting balloon was advanced across the lesion and inflated to 14 atm for 1 minute.  Follow-up imaging demonstrated less than 15% residual stenosis  The detector was then repositioned over the peripheral portion of the AV access and a second sheath in the retrograde direction is placed using ultrasound guidance.  Graft is evaluated with ultrasound.  It is noted to be echolucent indicating patency.  Lidocaine is  infiltrated soft tissues and under direct visualization micro needle is inserted into the graft.  Image was recorded for the permanent record.  Microwire was then advanced followed by the micro sheath J-wire followed by a 7 French sheath.  A 6 mm x 40 mm Dorado balloon was used to treat the stricture within the peripheral portion of the AV access. Inflation was to 20 atm for 1 minutes.  Following this an 8 mm x 60 mm Covera stent was advanced across both the stricture as well as the origin of the pseudoaneurysm.  It was deployed without difficulty and then postdilated with an 8 mm x 40 mm Dorado balloon.  A waist remained at the stricture site and therefore a 9 mm x 20 mm Dorado balloon was advanced across this segment inflation was to 36 atm for 1 minute.  Follow-up imaging through a Kumpe catheter placed at the arterial anastomosis demonstrated approximately 20% residual stenosis with successful exclusion of the pseudoaneurysm.  A 4-0 Monocryl purse-string sutures were sewn around the sheaths.  The sheaths were removed and light pressure was applied.  A sterile bandage was applied to the puncture site.    COMPLICATIONS: None  CONDITION: Dennis Walker, M.D [facility] Office: [phone]  02/07/2018 9:09 AM

## 2018-02-16 ENCOUNTER — Emergency Department: Payer: Medicare Other

## 2018-02-16 ENCOUNTER — Encounter: Payer: Self-pay | Admitting: Internal Medicine

## 2018-02-16 ENCOUNTER — Inpatient Hospital Stay
Admission: EM | Admit: 2018-02-16 | Discharge: 2018-02-23 | DRG: 871 | Disposition: A | Discharge status: Home / Self Care | Payer: Medicare Other | Attending: Internal Medicine | Admitting: Internal Medicine

## 2018-02-16 ENCOUNTER — Other Ambulatory Visit: Payer: Self-pay

## 2018-02-16 DIAGNOSIS — Z89511 Acquired absence of right leg below knee: Secondary | ICD-10-CM

## 2018-02-16 DIAGNOSIS — Z91013 Allergy to seafood: Secondary | ICD-10-CM

## 2018-02-16 DIAGNOSIS — Z8614 Personal history of Methicillin resistant Staphylococcus aureus infection: Secondary | ICD-10-CM

## 2018-02-16 DIAGNOSIS — K5900 Constipation, unspecified: Secondary | ICD-10-CM | POA: Diagnosis present

## 2018-02-16 DIAGNOSIS — E1122 Type 2 diabetes mellitus with diabetic chronic kidney disease: Secondary | ICD-10-CM | POA: Diagnosis present

## 2018-02-16 DIAGNOSIS — Z794 Long term (current) use of insulin: Secondary | ICD-10-CM

## 2018-02-16 DIAGNOSIS — B9689 Other specified bacterial agents as the cause of diseases classified elsewhere: Secondary | ICD-10-CM | POA: Diagnosis present

## 2018-02-16 DIAGNOSIS — R531 Weakness: Secondary | ICD-10-CM | POA: Diagnosis not present

## 2018-02-16 DIAGNOSIS — E1151 Type 2 diabetes mellitus with diabetic peripheral angiopathy without gangrene: Secondary | ICD-10-CM | POA: Diagnosis present

## 2018-02-16 DIAGNOSIS — Z882 Allergy status to sulfonamides status: Secondary | ICD-10-CM

## 2018-02-16 DIAGNOSIS — B9562 Methicillin resistant Staphylococcus aureus infection as the cause of diseases classified elsewhere: Secondary | ICD-10-CM | POA: Diagnosis present

## 2018-02-16 DIAGNOSIS — Z881 Allergy status to other antibiotic agents status: Secondary | ICD-10-CM

## 2018-02-16 DIAGNOSIS — I69354 Hemiplegia and hemiparesis following cerebral infarction affecting left non-dominant side: Secondary | ICD-10-CM

## 2018-02-16 DIAGNOSIS — Z22322 Carrier or suspected carrier of Methicillin resistant Staphylococcus aureus: Secondary | ICD-10-CM

## 2018-02-16 DIAGNOSIS — Z7982 Long term (current) use of aspirin: Secondary | ICD-10-CM

## 2018-02-16 DIAGNOSIS — F419 Anxiety disorder, unspecified: Secondary | ICD-10-CM | POA: Diagnosis present

## 2018-02-16 DIAGNOSIS — Z8612 Personal history of poliomyelitis: Secondary | ICD-10-CM

## 2018-02-16 DIAGNOSIS — E1169 Type 2 diabetes mellitus with other specified complication: Secondary | ICD-10-CM | POA: Diagnosis present

## 2018-02-16 DIAGNOSIS — N186 End stage renal disease: Secondary | ICD-10-CM | POA: Diagnosis present

## 2018-02-16 DIAGNOSIS — Z7902 Long term (current) use of antithrombotics/antiplatelets: Secondary | ICD-10-CM

## 2018-02-16 DIAGNOSIS — G061 Intraspinal abscess and granuloma: Secondary | ICD-10-CM | POA: Diagnosis present

## 2018-02-16 DIAGNOSIS — E785 Hyperlipidemia, unspecified: Secondary | ICD-10-CM | POA: Diagnosis present

## 2018-02-16 DIAGNOSIS — M479 Spondylosis, unspecified: Secondary | ICD-10-CM | POA: Diagnosis present

## 2018-02-16 DIAGNOSIS — I120 Hypertensive chronic kidney disease with stage 5 chronic kidney disease or end stage renal disease: Secondary | ICD-10-CM | POA: Diagnosis present

## 2018-02-16 DIAGNOSIS — M4642 Discitis, unspecified, cervical region: Secondary | ICD-10-CM | POA: Diagnosis present

## 2018-02-16 DIAGNOSIS — I252 Old myocardial infarction: Secondary | ICD-10-CM

## 2018-02-16 DIAGNOSIS — Z992 Dependence on renal dialysis: Secondary | ICD-10-CM

## 2018-02-16 DIAGNOSIS — E876 Hypokalemia: Secondary | ICD-10-CM | POA: Diagnosis present

## 2018-02-16 DIAGNOSIS — M869 Osteomyelitis, unspecified: Secondary | ICD-10-CM | POA: Diagnosis present

## 2018-02-16 DIAGNOSIS — Z79891 Long term (current) use of opiate analgesic: Secondary | ICD-10-CM

## 2018-02-16 DIAGNOSIS — R7881 Bacteremia: Principal | ICD-10-CM | POA: Diagnosis present

## 2018-02-16 DIAGNOSIS — D631 Anemia in chronic kidney disease: Secondary | ICD-10-CM | POA: Diagnosis present

## 2018-02-16 DIAGNOSIS — I2510 Atherosclerotic heart disease of native coronary artery without angina pectoris: Secondary | ICD-10-CM | POA: Diagnosis present

## 2018-02-16 DIAGNOSIS — N2581 Secondary hyperparathyroidism of renal origin: Secondary | ICD-10-CM | POA: Diagnosis present

## 2018-02-16 DIAGNOSIS — T827XXA Infection and inflammatory reaction due to other cardiac and vascular devices, implants and grafts, initial encounter: Secondary | ICD-10-CM

## 2018-02-16 DIAGNOSIS — H5461 Unqualified visual loss, right eye, normal vision left eye: Secondary | ICD-10-CM | POA: Diagnosis present

## 2018-02-16 DIAGNOSIS — F329 Major depressive disorder, single episode, unspecified: Secondary | ICD-10-CM | POA: Diagnosis present

## 2018-02-16 DIAGNOSIS — Z79899 Other long term (current) drug therapy: Secondary | ICD-10-CM

## 2018-02-16 DIAGNOSIS — R509 Fever, unspecified: Secondary | ICD-10-CM

## 2018-02-16 DIAGNOSIS — Z823 Family history of stroke: Secondary | ICD-10-CM

## 2018-02-16 DIAGNOSIS — I770 Arteriovenous fistula, acquired: Secondary | ICD-10-CM

## 2018-02-16 LAB — MRSA PCR SCREENING: MRSA BY PCR: POSITIVE — AB

## 2018-02-16 LAB — CBC WITH DIFFERENTIAL/PLATELET
BASOS ABS: 0.1 10*3/uL (ref 0–0.1)
Basophils Relative: 1 %
Eosinophils Absolute: 0.4 10*3/uL (ref 0–0.7)
Eosinophils Relative: 4 %
HEMATOCRIT: 37.6 % — AB (ref 40.0–52.0)
HEMOGLOBIN: 12.2 g/dL — AB (ref 13.0–18.0)
LYMPHS PCT: 14 %
Lymphs Abs: 1.3 10*3/uL (ref 1.0–3.6)
MCH: 27.7 pg (ref 26.0–34.0)
MCHC: 32.5 g/dL (ref 32.0–36.0)
MCV: 85.1 fL (ref 80.0–100.0)
Monocytes Absolute: 1.2 10*3/uL — ABNORMAL HIGH (ref 0.2–1.0)
Monocytes Relative: 13 %
NEUTROS ABS: 6.7 10*3/uL — AB (ref 1.4–6.5)
Neutrophils Relative %: 68 %
Platelets: 251 10*3/uL (ref 150–440)
RBC: 4.42 MIL/uL (ref 4.40–5.90)
RDW: 18 % — ABNORMAL HIGH (ref 11.5–14.5)
WBC: 9.7 10*3/uL (ref 3.8–10.6)

## 2018-02-16 LAB — GLUCOSE, CAPILLARY
GLUCOSE-CAPILLARY: 165 mg/dL — AB (ref 70–99)
GLUCOSE-CAPILLARY: 113 mg/dL — AB (ref 70–99)

## 2018-02-16 LAB — COMPREHENSIVE METABOLIC PANEL
ALBUMIN: 3.3 g/dL — AB (ref 3.5–5.0)
ALT: 11 U/L (ref 0–44)
AST: 21 U/L (ref 15–41)
Alkaline Phosphatase: 103 U/L (ref 38–126)
Anion gap: 10 (ref 5–15)
BUN: 31 mg/dL — ABNORMAL HIGH (ref 8–23)
CHLORIDE: 97 mmol/L — AB (ref 98–111)
CO2: 28 mmol/L (ref 22–32)
Calcium: 8.6 mg/dL — ABNORMAL LOW (ref 8.9–10.3)
Creatinine, Ser: 4.42 mg/dL — ABNORMAL HIGH (ref 0.61–1.24)
GFR calc non Af Amer: 13 mL/min — ABNORMAL LOW (ref >60)
GFR, EST AFRICAN AMERICAN: 15 mL/min — AB (ref >60)
GLUCOSE: 177 mg/dL — AB (ref 70–99)
Potassium: 3.1 mmol/L — ABNORMAL LOW (ref 3.5–5.1)
SODIUM: 135 mmol/L (ref 135–145)
Total Bilirubin: 0.9 mg/dL (ref 0.3–1.2)
Total Protein: 7.6 g/dL (ref 6.5–8.1)

## 2018-02-16 LAB — LACTIC ACID, PLASMA
Lactic Acid, Venous: 1.2 mmol/L (ref 0.5–1.9)
Lactic Acid, Venous: 0.8 mmol/L (ref 0.5–1.9)

## 2018-02-16 LAB — BRAIN NATRIURETIC PEPTIDE: B Natriuretic Peptide: 1705 pg/mL — ABNORMAL HIGH (ref 0.0–100.0)

## 2018-02-16 LAB — TROPONIN I: TROPONIN I: 0.09 ng/mL — AB (ref <0.03)

## 2018-02-16 MED ORDER — B COMPLEX-C PO TABS
1.0000 | ORAL_TABLET | Freq: Every day | ORAL | Status: DC
  Administered 2018-02-17 – 2018-02-23 (×6): 1 via ORAL
  Filled 2018-02-16 (×8): qty 1

## 2018-02-16 MED ORDER — ATORVASTATIN CALCIUM 20 MG PO TABS
40.0000 mg | ORAL_TABLET | Freq: Every day | ORAL | Status: DC
  Administered 2018-02-16 – 2018-02-23 (×8): 40 mg via ORAL
  Filled 2018-02-16 (×8): qty 2

## 2018-02-16 MED ORDER — PIPERACILLIN-TAZOBACTAM 3.375 G IVPB
3.3750 g | Freq: Two times a day (BID) | INTRAVENOUS | Status: DC
  Administered 2018-02-16 – 2018-02-18 (×4): 3.375 g via INTRAVENOUS
  Filled 2018-02-16 (×3): qty 50

## 2018-02-16 MED ORDER — ASPIRIN EC 81 MG PO TBEC
81.0000 mg | DELAYED_RELEASE_TABLET | Freq: Every day | ORAL | Status: DC
  Administered 2018-02-16 – 2018-02-23 (×8): 81 mg via ORAL
  Filled 2018-02-16 (×8): qty 1

## 2018-02-16 MED ORDER — POLYVINYL ALCOHOL 1.4 % OP SOLN
1.0000 [drp] | Freq: Four times a day (QID) | OPHTHALMIC | Status: DC
  Administered 2018-02-16 – 2018-02-23 (×21): 1 [drp] via OPHTHALMIC
  Filled 2018-02-16: qty 15

## 2018-02-16 MED ORDER — LACTULOSE 10 GM/15ML PO SOLN
20.0000 g | Freq: Every day | ORAL | Status: DC
  Administered 2018-02-18 – 2018-02-22 (×3): 20 g via ORAL
  Filled 2018-02-16 (×7): qty 30

## 2018-02-16 MED ORDER — ONDANSETRON HCL 4 MG/2ML IJ SOLN: 4.0000 mg | Freq: Four times a day (QID) | INTRAMUSCULAR | Status: DC | PRN

## 2018-02-16 MED ORDER — MORPHINE SULFATE (PF) 4 MG/ML IV SOLN: 4.0000 mg | Freq: Once | INTRAVENOUS | Status: DC

## 2018-02-16 MED ORDER — OXYCODONE-ACETAMINOPHEN 5-325 MG PO TABS
ORAL_TABLET | ORAL | Status: AC
  Administered 2018-02-16: 1 via ORAL
  Filled 2018-02-16: qty 1

## 2018-02-16 MED ORDER — METOPROLOL SUCCINATE ER 25 MG PO TB24
12.5000 mg | ORAL_TABLET | Freq: Two times a day (BID) | ORAL | Status: DC
  Administered 2018-02-16 – 2018-02-23 (×12): 12.5 mg via ORAL
  Filled 2018-02-16 (×12): qty 1

## 2018-02-16 MED ORDER — PIPERACILLIN-TAZOBACTAM 3.375 G IVPB 30 MIN: 3.3750 g | Freq: Once | INTRAVENOUS | Status: DC

## 2018-02-16 MED ORDER — NEPRO/CARBSTEADY PO LIQD
237.0000 mL | Freq: Three times a day (TID) | ORAL | Status: DC
Start: 2018-02-16 — End: 2018-02-23
  Administered 2018-02-16 – 2018-02-23 (×11): 237 mL via ORAL

## 2018-02-16 MED ORDER — HEPARIN SODIUM (PORCINE) 5000 UNIT/ML IJ SOLN
5000.0000 [IU] | Freq: Three times a day (TID) | INTRAMUSCULAR | Status: DC
  Administered 2018-02-16 – 2018-02-23 (×20): 5000 [IU] via SUBCUTANEOUS
  Filled 2018-02-16 (×20): qty 1

## 2018-02-16 MED ORDER — VITAMIN C 500 MG PO TABS
500.0000 mg | ORAL_TABLET | Freq: Two times a day (BID) | ORAL | Status: DC
  Administered 2018-02-17 – 2018-02-23 (×10): 500 mg via ORAL
  Filled 2018-02-16 (×15): qty 1

## 2018-02-16 MED ORDER — SODIUM CHLORIDE 0.9% FLUSH: 3.0000 mL | INTRAVENOUS | Status: DC | PRN

## 2018-02-16 MED ORDER — POLYETHYLENE GLYCOL 3350 17 G PO PACK
17.0000 g | PACK | Freq: Every day | ORAL | Status: DC
  Administered 2018-02-17 – 2018-02-23 (×7): 17 g via ORAL
  Filled 2018-02-16 (×8): qty 1

## 2018-02-16 MED ORDER — SENNA 8.6 MG PO TABS
8.6000 mg | ORAL_TABLET | Freq: Two times a day (BID) | ORAL | Status: DC
  Administered 2018-02-16 – 2018-02-23 (×10): 8.6 mg via ORAL
  Filled 2018-02-16 (×10): qty 1

## 2018-02-16 MED ORDER — TRAMADOL HCL 50 MG PO TABS
50.0000 mg | ORAL_TABLET | Freq: Four times a day (QID) | ORAL | Status: DC | PRN
  Administered 2018-02-17 – 2018-02-19 (×7): 50 mg via ORAL
  Filled 2018-02-16 (×7): qty 1

## 2018-02-16 MED ORDER — PIPERACILLIN-TAZOBACTAM 4.5 G IVPB: 4.5000 g | Freq: Once | INTRAVENOUS | Status: DC

## 2018-02-16 MED ORDER — NITROGLYCERIN 0.4 MG SL SUBL: 0.4000 mg | SUBLINGUAL_TABLET | SUBLINGUAL | Status: DC | PRN

## 2018-02-16 MED ORDER — SERTRALINE HCL 50 MG PO TABS
100.0000 mg | ORAL_TABLET | Freq: Every day | ORAL | Status: DC
  Administered 2018-02-16 – 2018-02-22 (×7): 100 mg via ORAL
  Filled 2018-02-16 (×7): qty 2

## 2018-02-16 MED ORDER — ONDANSETRON HCL 4 MG PO TABS: 4.0000 mg | ORAL_TABLET | Freq: Four times a day (QID) | ORAL | Status: DC | PRN

## 2018-02-16 MED ORDER — GABAPENTIN 100 MG PO CAPS
200.0000 mg | ORAL_CAPSULE | Freq: Three times a day (TID) | ORAL | Status: DC
Start: 2018-02-16 — End: 2018-02-23
  Administered 2018-02-16 – 2018-02-23 (×17): 200 mg via ORAL
  Filled 2018-02-16 (×17): qty 2

## 2018-02-16 MED ORDER — OXYCODONE HCL 5 MG PO TABS
5.0000 mg | ORAL_TABLET | Freq: Three times a day (TID) | ORAL | Status: DC | PRN
  Administered 2018-02-16 – 2018-02-19 (×8): 5 mg via ORAL
  Filled 2018-02-16 (×9): qty 1

## 2018-02-16 MED ORDER — FERROUS SULFATE 325 (65 FE) MG PO TABS
325.0000 mg | ORAL_TABLET | Freq: Every day | ORAL | Status: DC
  Administered 2018-02-17 – 2018-02-23 (×7): 325 mg via ORAL
  Filled 2018-02-16 (×8): qty 1

## 2018-02-16 MED ORDER — LISINOPRIL 5 MG PO TABS
5.0000 mg | ORAL_TABLET | Freq: Every day | ORAL | Status: DC
  Administered 2018-02-16 – 2018-02-22 (×7): 5 mg via ORAL
  Filled 2018-02-16 (×7): qty 1

## 2018-02-16 MED ORDER — CLOPIDOGREL BISULFATE 75 MG PO TABS
75.0000 mg | ORAL_TABLET | Freq: Every day | ORAL | Status: DC
  Administered 2018-02-16 – 2018-02-23 (×8): 75 mg via ORAL
  Filled 2018-02-16 (×8): qty 1

## 2018-02-16 MED ORDER — BENZOCAINE 10 % MT GEL
1.0000 "application " | Freq: Four times a day (QID) | OROMUCOSAL | Status: DC | PRN
  Filled 2018-02-16: qty 9

## 2018-02-16 MED ORDER — OXYCODONE-ACETAMINOPHEN 5-325 MG PO TABS
1.0000 | ORAL_TABLET | Freq: Once | ORAL | Status: AC
  Administered 2018-02-16: 1 via ORAL

## 2018-02-16 MED ORDER — HYDROCERIN EX CREA
1.0000 "application " | TOPICAL_CREAM | Freq: Two times a day (BID) | CUTANEOUS | Status: DC | PRN
  Filled 2018-02-16: qty 113

## 2018-02-16 MED ORDER — ADULT MULTIVITAMIN W/MINERALS CH
ORAL_TABLET | Freq: Every day | ORAL | Status: DC
  Administered 2018-02-16 – 2018-02-22 (×7): 1 via ORAL
  Filled 2018-02-16 (×7): qty 1

## 2018-02-16 MED ORDER — LIDOCAINE-PRILOCAINE 2.5-2.5 % EX CREA
1.0000 "application " | TOPICAL_CREAM | CUTANEOUS | Status: DC
  Filled 2018-02-16: qty 5

## 2018-02-16 MED ORDER — VITAMIN D 1000 UNITS PO TABS
1000.0000 [IU] | ORAL_TABLET | Freq: Every day | ORAL | Status: DC
  Administered 2018-02-17 – 2018-02-23 (×7): 1000 [IU] via ORAL
  Filled 2018-02-16 (×8): qty 1

## 2018-02-16 MED ORDER — SODIUM CHLORIDE 0.9% FLUSH
3.0000 mL | Freq: Two times a day (BID) | INTRAVENOUS | Status: DC
  Administered 2018-02-16 – 2018-02-23 (×9): 3 mL via INTRAVENOUS

## 2018-02-16 MED ORDER — INSULIN ASPART 100 UNIT/ML ~~LOC~~ SOLN
0.0000 [IU] | Freq: Three times a day (TID) | SUBCUTANEOUS | Status: DC
  Administered 2018-02-16: 3 [IU] via SUBCUTANEOUS
  Administered 2018-02-17 – 2018-02-18 (×3): 2 [IU] via SUBCUTANEOUS
  Administered 2018-02-18 (×2): 3 [IU] via SUBCUTANEOUS
  Administered 2018-02-19 (×2): 2 [IU] via SUBCUTANEOUS
  Administered 2018-02-21 (×2): 3 [IU] via SUBCUTANEOUS
  Administered 2018-02-22 – 2018-02-23 (×5): 2 [IU] via SUBCUTANEOUS
  Filled 2018-02-16 (×15): qty 1

## 2018-02-16 MED ORDER — INSULIN ASPART 100 UNIT/ML ~~LOC~~ SOLN: 0.0000 [IU] | Freq: Every day | SUBCUTANEOUS | Status: DC

## 2018-02-16 MED ORDER — SODIUM CHLORIDE 0.9 % IV SOLN: 250.0000 mL | INTRAVENOUS | Status: DC | PRN

## 2018-02-16 MED ORDER — ACETAMINOPHEN 500 MG PO TABS
500.0000 mg | ORAL_TABLET | ORAL | Status: DC | PRN
  Administered 2018-02-17: 500 mg via ORAL

## 2018-02-16 MED ORDER — ORAL CARE MOUTH RINSE
15.0000 mL | Freq: Two times a day (BID) | OROMUCOSAL | Status: DC
  Administered 2018-02-16 – 2018-02-23 (×11): 15 mL via OROMUCOSAL

## 2018-02-16 NOTE — ED Notes (Signed)
Pt c/o of pain every where when he moves it hurts. EDP at bedside.

## 2018-02-16 NOTE — Progress Notes (Signed)
Advanced care plan. Purpose of the Encounter: CODE STATUS Parties in Attendance: Patient Patient's Decision Capacity: Good Subjective/Patient's story: [facility] for [facility] blood cultures Objective/Medical story Has gram-negative bacteremia needs IV antibiotics End-stage renal disease on dialysis Goals of care determination:  Advance care directives goals of care discussed and plan of treatment Patient wants everything done for now which includes CPR and intubation if need arises CODE STATUS: Full code Time spent discussing advanced care planning: 16 minutes

## 2018-02-16 NOTE — ED Notes (Signed)
This RN called IV Team. IV team in transit. RN will monitor

## 2018-02-16 NOTE — ED Notes (Signed)
Blood cultures were attempted x3. No cultures have been drawn at this time. RN will notify lab
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
## 2018-02-16 NOTE — Clinical Social Work Note (Signed)
Clinical Social Work Assessment  Patient Details  Name: Dennis Walker MRN: [id number] Date of Birth: [birth year]  Date of referral:  02/16/18               Reason for consult:  Other (Comment Required)(From [facility] )                Permission sought to share information with:  Facility Art therapist granted to share information::  Yes, Verbal Permission Granted  Name::      [facility]   Agency::     Relationship::     Contact Information:     Housing/Transportation Living arrangements for the past 2 months:  [place] of Information:  Patient, Facility Patient Interpreter Needed:  None Criminal Activity/Legal Involvement Pertinent to Current Situation/Hospitalization:  No - Comment as needed Significant Relationships:  Adult Children Lives with:  Facility Resident Do you feel safe going back to the place where you live?  Yes Need for family participation in patient care:  Yes (Comment)  Care giving concerns:  Patient is a long term care resident at [facility]. Patient goes to dialysis T,TH,S at [facility] in [place], chair time 6 am.    Social Worker assessment / plan:  Holiday representative ([place]) reviewed chart and noted that patient is from [facility]. Per Otila Kluver [facility] liaison patient is a [facility] term [facility] resident at [facility] and can return when stable. CSW met with patient alone at bedside to discuss D/C plan. Patient was alert and oriented X4 and was laying in the bed. CSW introduced self and explained role of CSW department. Per patient he lives at [facility] and is agreeable to return there. Patient made CSW aware of his chair time for dialysis. [facility] dialysis coordinator is aware of above. FL2 complete. CSW will continue to follow and assist as needed.     Employment status:  Disabled (Comment on whether or not currently receiving Disability) Insurance information:  Medicare, Medicaid In [place] PT Recommendations:  Not assessed at this time Information  / Referral to community resources:  [place]  Patient/Family's Response to care:  Patient is agreeable to D/C to [facility].   Patient/Family's Understanding of and Emotional Response to Diagnosis, Current Treatment, and Prognosis:  Patient was very pleasant and thanked CSW for assistance.   Emotional Assessment Appearance:  Appears stated age Attitude/Demeanor/Rapport:    Affect (typically observed):  Accepting, Adaptable, Pleasant Orientation:  Oriented to Self, Oriented to Place, Oriented to Situation, Oriented to  Time Alcohol / Substance use:  Not Applicable Psych involvement (Current and /or in the community):  No (Comment)  Discharge Needs  Concerns to be addressed:  Discharge Planning Concerns Readmission within the last 30 days:  No Current discharge risk:  Dependent with Mobility, Chronically ill Barriers to Discharge:  Continued Medical Work up   [employer], Veronia Beets, LCSW 02/16/2018, 4:46 PM

## 2018-02-16 NOTE — ED Triage Notes (Signed)
Pt presents from [facility] via [facility] for general weakness. Pt is a diaylsis pt that goes tues th sat. Pt is a gram neg infection VSS.  FIstula on left arm
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
## 2018-02-16 NOTE — ED Notes (Signed)
Date and time results received: 02/16/18 1109   Test: Trop Critical Value: 0.09  Name of Provider Notified: [facility]

## 2018-02-16 NOTE — ED Notes (Signed)
B.C obtained by lab at this time.

## 2018-02-16 NOTE — NC FL2 (Signed)
[place] LEVEL OF CARE SCREENING TOOL     IDENTIFICATION  Patient Name: Dennis Walker Birthdate: [birth year] Sex: male Admission Date (Current Location): 02/16/2018  [facility] and [place] Number:  [name] ([id number] [facility]) Facility and Address:  [facility], [address] [place], [place] [address]      Provider Number: [id number]  Attending Physician Name and Address:  Saundra Shelling, MD  Relative Name and Phone Number:       Current Level of Care: Hospital Recommended Level of Care: [place] Prior Approval Number:    Date Approved/Denied:   PASRR Number: [id number])  Discharge Plan: SNF    Current Diagnoses: Patient Active Problem List   Diagnosis Date Noted  . Bacteremia 02/16/2018  . 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 Chest pain, mid sternal 01/03/2018  . Acute on chronic respiratory failure with hypoxia ([place]) 12/24/2017  . Elevated troponin 08/05/2017  . 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 Complication from renal dialysis device 02/20/2017  . End stage renal disease ([place]) 02/12/2017  . Altered mental status 02/02/2017  . Atherosclerosis of native arteries of extremity with intermittent claudication ([place]) 12/20/2016  . Ulcer of amputation stump of lower extremity ([place]) 12/20/2016  . Diabetes ([place]) 12/20/2016  . Essential hypertension 12/20/2016  . Pure hypercholesterolemia 12/20/2016    Orientation RESPIRATION BLADDER Height & Weight     Self, Place  Normal Continent Weight: 189 lb (85.7 kg) Height:  5\' 11"  (180.3 cm)  BEHAVIORAL SYMPTOMS/MOOD NEUROLOGICAL BOWEL NUTRITION STATUS      Continent Diet(Diet: Renal/ Card Modified. )  AMBULATORY STATUS COMMUNICATION OF NEEDS Skin   Extensive Assist Verbally Normal                       Personal Care Assistance Level of Assistance  Bathing, Feeding, Dressing Bathing Assistance: Limited assistance Feeding assistance: Independent Dressing Assistance:  Limited assistance     Functional Limitations Info  Sight, Hearing, Speech Sight Info: Adequate Hearing Info: Adequate Speech Info: Adequate    SPECIAL CARE FACTORS FREQUENCY  (Dialysis patient )                    Contractures      Additional Factors Info  Code Status, Allergies, Isolation Precautions Code Status Info: (Full Code. ) Allergies Info: (Cefuroxime, Shrimp Shellfish Allergy, Sulfa Antibiotics)     Isolation Precautions Info: (history of MRSA )     Current Medications (02/16/2018):  This is the current hospital active medication list Current Facility-Administered Medications  Medication Dose Route Frequency Provider Last Rate Last Dose  . 0.9 %  sodium chloride infusion  250 mL Intravenous PRN Pyreddy, Reatha Harps, MD      . acetaminophen (TYLENOL) tablet 500 mg  500 mg Oral Q4H PRN Pyreddy, Reatha Harps, MD      . aspirin EC tablet 81 mg  81 mg Oral q1800 Pyreddy, Reatha Harps, MD      . atorvastatin (LIPITOR) tablet 40 mg  40 mg Oral q1800 Pyreddy, Reatha Harps, MD      . B-complex with vitamin C tablet 1 tablet  1 tablet Oral q1800 Pyreddy, Pavan, MD      . benzocaine (ORAJEL) 10 % mucosal gel 1 application  1 application Mouth/Throat QID PRN Pyreddy, Reatha Harps, MD      . cholecalciferol (VITAMIN D) tablet 1,000 Units  1,000 Units Oral Daily Pyreddy, Pavan, MD      . clopidogrel (PLAVIX) tablet 75 mg  75 mg  Oral q1800 Saundra Shelling, MD      . feeding supplement (NEPRO CARB STEADY) liquid 237 mL  237 mL Oral TID BM Pyreddy, Pavan, MD      . ferrous sulfate tablet 325 mg  325 mg Oral q1800 Pyreddy, Pavan, MD      . gabapentin (NEURONTIN) capsule 200 mg  200 mg Oral TID Saundra Shelling, MD      . heparin injection 5,000 Units  5,000 Units Subcutaneous Q8H Pyreddy, Pavan, MD      . hydrocerin (EUCERIN) cream 1 application  1 application Topical BID PRN Pyreddy, Reatha Harps, MD      . insulin aspart (novoLOG) injection 0-15 Units  0-15 Units Subcutaneous TID WC Pyreddy, Pavan, MD      . insulin  aspart (novoLOG) injection 0-5 Units  0-5 Units Subcutaneous QHS Pyreddy, Pavan, MD      . lactulose (CHRONULAC) 10 GM/15ML solution 20 g  20 g Oral QHS Pyreddy, Reatha Harps, MD      . Derrill Memo ON 02/17/2018] lidocaine-prilocaine (EMLA) cream 1 application  1 application Topical Q T,Th,Sa-HD Pyreddy, Pavan, MD      . lisinopril (PRINIVIL,ZESTRIL) tablet 5 mg  5 mg Oral q1800 Pyreddy, Reatha Harps, MD      . MEDLINE mouth rinse  15 mL Mouth Rinse BID Pyreddy, Pavan, MD      . metoprolol succinate (TOPROL-XL) 24 hr tablet 12.5 mg  12.5 mg Oral BID Pyreddy, Reatha Harps, MD      . multivitamin with minerals tablet   Oral QHS Pyreddy, Reatha Harps, MD      . nitroGLYCERIN (NITROSTAT) SL tablet 0.4 mg  0.4 mg Sublingual Q5 min PRN Pyreddy, Reatha Harps, MD      . ondansetron (ZOFRAN) tablet 4 mg  4 mg Oral Q6H PRN Pyreddy, Reatha Harps, MD       Or  . ondansetron (ZOFRAN) injection 4 mg  4 mg Intravenous Q6H PRN Pyreddy, Pavan, MD      . oxyCODONE (Oxy IR/ROXICODONE) immediate release tablet 5 mg  5 mg Oral TID PRN Pyreddy, Reatha Harps, MD      . piperacillin-tazobactam (ZOSYN) IVPB 3.375 g  3.375 g Intravenous Once Christy, Scott D, RPH      . polyethylene glycol (MIRALAX / GLYCOLAX) packet 17 g  17 g Oral q1800 Pyreddy, Reatha Harps, MD      . polyvinyl alcohol (LIQUIFILM TEARS) 1.4 % ophthalmic solution 1 drop  1 drop Left Eye QID Pyreddy, Reatha Harps, MD      . senna (SENOKOT) tablet 8.6 mg  8.6 mg Oral BID Pyreddy, Reatha Harps, MD      . sertraline (ZOLOFT) tablet 100 mg  100 mg Oral QHS Pyreddy, Pavan, MD      . sodium chloride flush (NS) 0.9 % injection 3 mL  3 mL Intravenous Q12H Pyreddy, Pavan, MD      . sodium chloride flush (NS) 0.9 % injection 3 mL  3 mL Intravenous PRN Pyreddy, Pavan, MD      . traMADol (ULTRAM) tablet 50 mg  50 mg Oral Q6H PRN Pyreddy, Pavan, MD      . vitamin C (ASCORBIC ACID) tablet 500 mg  500 mg Oral BID Saundra Shelling, MD         Discharge Medications: Please see discharge summary for a list of discharge medications.  Relevant  Imaging Results:  Relevant Lab Results:   Additional Information (SSN: [id number])  Eilee Schader, Veronia Beets, LCSW

## 2018-02-16 NOTE — Progress Notes (Signed)
Pharmacy Antibiotic Note  Dennis Walker is a 67 y.o. male admitted on 02/16/2018 with gram negative bacteremia.  Pharmacy has been consulted for Zosyn dosing.  Plan: Zosyn 3.375 g EI q 12 hours.   Height: 5\' 11"  (180.3 cm) Weight: 189 lb (85.7 kg) IBW/kg (Calculated) : 75.3  Temp (24hrs), Avg:98.2 F (36.8 C), Min:97.8 F (36.6 C), Max:98.5 F (36.9 C)  Recent Labs  Lab 02/16/18 1025 02/16/18 1026 02/16/18 1539  WBC  --  9.7  --   CREATININE  --  4.42*  --   LATICACIDVEN 1.2  --  0.8    Estimated Creatinine Clearance: 17.5 mL/min (A) (by C-G formula based on SCr of 4.42 mg/dL (H)).    Allergies  Allergen Reactions  . Cefuroxime Itching  . Shrimp [Shellfish Allergy] Other (See Comments)    Per mar. Unknown reaction   . Sulfa Antibiotics Itching and Rash    Antimicrobials this admission: Zosyn 8/16 >>   Dose adjustments this admission:  Microbiology results: 8/16 BCx:  MRSA PCR:   Thank you for allowing pharmacy to be a part of this patient's care.  Napoleon Form 02/16/2018 6:51 PM

## 2018-02-17 DIAGNOSIS — M479 Spondylosis, unspecified: Secondary | ICD-10-CM | POA: Diagnosis present

## 2018-02-17 DIAGNOSIS — G061 Intraspinal abscess and granuloma: Secondary | ICD-10-CM | POA: Diagnosis present

## 2018-02-17 DIAGNOSIS — H5461 Unqualified visual loss, right eye, normal vision left eye: Secondary | ICD-10-CM | POA: Diagnosis present

## 2018-02-17 DIAGNOSIS — I69354 Hemiplegia and hemiparesis following cerebral infarction affecting left non-dominant side: Secondary | ICD-10-CM | POA: Diagnosis not present

## 2018-02-17 DIAGNOSIS — R531 Weakness: Secondary | ICD-10-CM | POA: Diagnosis present

## 2018-02-17 DIAGNOSIS — K5900 Constipation, unspecified: Secondary | ICD-10-CM | POA: Diagnosis present

## 2018-02-17 DIAGNOSIS — I340 Nonrheumatic mitral (valve) insufficiency: Secondary | ICD-10-CM | POA: Diagnosis not present

## 2018-02-17 DIAGNOSIS — F419 Anxiety disorder, unspecified: Secondary | ICD-10-CM | POA: Diagnosis present

## 2018-02-17 DIAGNOSIS — E1122 Type 2 diabetes mellitus with diabetic chronic kidney disease: Secondary | ICD-10-CM | POA: Diagnosis present

## 2018-02-17 DIAGNOSIS — R7881 Bacteremia: Secondary | ICD-10-CM | POA: Diagnosis present

## 2018-02-17 DIAGNOSIS — N186 End stage renal disease: Secondary | ICD-10-CM | POA: Diagnosis present

## 2018-02-17 DIAGNOSIS — I2510 Atherosclerotic heart disease of native coronary artery without angina pectoris: Secondary | ICD-10-CM | POA: Diagnosis present

## 2018-02-17 DIAGNOSIS — F329 Major depressive disorder, single episode, unspecified: Secondary | ICD-10-CM | POA: Diagnosis present

## 2018-02-17 DIAGNOSIS — M4642 Discitis, unspecified, cervical region: Secondary | ICD-10-CM | POA: Diagnosis present

## 2018-02-17 DIAGNOSIS — B9562 Methicillin resistant Staphylococcus aureus infection as the cause of diseases classified elsewhere: Secondary | ICD-10-CM | POA: Diagnosis present

## 2018-02-17 DIAGNOSIS — Z823 Family history of stroke: Secondary | ICD-10-CM | POA: Diagnosis not present

## 2018-02-17 DIAGNOSIS — M869 Osteomyelitis, unspecified: Secondary | ICD-10-CM | POA: Diagnosis present

## 2018-02-17 DIAGNOSIS — N2581 Secondary hyperparathyroidism of renal origin: Secondary | ICD-10-CM | POA: Diagnosis present

## 2018-02-17 DIAGNOSIS — E785 Hyperlipidemia, unspecified: Secondary | ICD-10-CM | POA: Diagnosis present

## 2018-02-17 DIAGNOSIS — E1151 Type 2 diabetes mellitus with diabetic peripheral angiopathy without gangrene: Secondary | ICD-10-CM | POA: Diagnosis present

## 2018-02-17 DIAGNOSIS — I120 Hypertensive chronic kidney disease with stage 5 chronic kidney disease or end stage renal disease: Secondary | ICD-10-CM | POA: Diagnosis present

## 2018-02-17 DIAGNOSIS — D631 Anemia in chronic kidney disease: Secondary | ICD-10-CM | POA: Diagnosis present

## 2018-02-17 DIAGNOSIS — E1169 Type 2 diabetes mellitus with other specified complication: Secondary | ICD-10-CM | POA: Diagnosis present

## 2018-02-17 DIAGNOSIS — Z8612 Personal history of poliomyelitis: Secondary | ICD-10-CM | POA: Diagnosis not present

## 2018-02-17 DIAGNOSIS — I252 Old myocardial infarction: Secondary | ICD-10-CM | POA: Diagnosis not present

## 2018-02-17 DIAGNOSIS — E876 Hypokalemia: Secondary | ICD-10-CM | POA: Diagnosis present

## 2018-02-17 LAB — BLOOD CULTURE ID PANEL (REFLEXED)
Acinetobacter baumannii: NOT DETECTED
CANDIDA GLABRATA: NOT DETECTED
Candida albicans: NOT DETECTED
Candida krusei: NOT DETECTED
Candida parapsilosis: NOT DETECTED
Candida tropicalis: NOT DETECTED
ENTEROBACTER CLOACAE COMPLEX: NOT DETECTED
ENTEROBACTERIACEAE SPECIES: NOT DETECTED
ENTEROCOCCUS SPECIES: NOT DETECTED
Escherichia coli: NOT DETECTED
Haemophilus influenzae: NOT DETECTED
Klebsiella oxytoca: NOT DETECTED
Klebsiella pneumoniae: NOT DETECTED
LISTERIA MONOCYTOGENES: NOT DETECTED
Methicillin resistance: DETECTED — AB
NEISSERIA MENINGITIDIS: NOT DETECTED
PROTEUS SPECIES: NOT DETECTED
Pseudomonas aeruginosa: NOT DETECTED
STAPHYLOCOCCUS SPECIES: DETECTED — AB
STREPTOCOCCUS AGALACTIAE: NOT DETECTED
Serratia marcescens: NOT DETECTED
Staphylococcus aureus (BCID): DETECTED — AB
Streptococcus pneumoniae: NOT DETECTED
Streptococcus pyogenes: NOT DETECTED
Streptococcus species: NOT DETECTED

## 2018-02-17 LAB — BASIC METABOLIC PANEL
Anion gap: 9 (ref 5–15)
BUN: 41 mg/dL — ABNORMAL HIGH (ref 8–23)
CALCIUM: 8.3 mg/dL — AB (ref 8.9–10.3)
CO2: 29 mmol/L (ref 22–32)
CREATININE: 5.38 mg/dL — AB (ref 0.61–1.24)
Chloride: 99 mmol/L (ref 98–111)
GFR calc non Af Amer: 10 mL/min — ABNORMAL LOW (ref >60)
GFR, EST AFRICAN AMERICAN: 12 mL/min — AB (ref >60)
Glucose, Bld: 139 mg/dL — ABNORMAL HIGH (ref 70–99)
Potassium: 3 mmol/L — ABNORMAL LOW (ref 3.5–5.1)
SODIUM: 137 mmol/L (ref 135–145)

## 2018-02-17 LAB — CBC
HCT: 35 % — ABNORMAL LOW (ref 40.0–52.0)
Hemoglobin: 11.4 g/dL — ABNORMAL LOW (ref 13.0–18.0)
MCH: 27.7 pg (ref 26.0–34.0)
MCHC: 32.6 g/dL (ref 32.0–36.0)
MCV: 84.8 fL (ref 80.0–100.0)
Platelets: 268 10*3/uL (ref 150–440)
RBC: 4.13 MIL/uL — AB (ref 4.40–5.90)
RDW: 17.9 % — ABNORMAL HIGH (ref 11.5–14.5)
WBC: 6.2 10*3/uL (ref 3.8–10.6)

## 2018-02-17 LAB — GLUCOSE, CAPILLARY
GLUCOSE-CAPILLARY: 134 mg/dL — AB (ref 70–99)
GLUCOSE-CAPILLARY: 134 mg/dL — AB (ref 70–99)
Glucose-Capillary: 170 mg/dL — ABNORMAL HIGH (ref 70–99)

## 2018-02-17 LAB — PHOSPHORUS: Phosphorus: 3.2 mg/dL (ref 2.5–4.6)

## 2018-02-17 MED ORDER — MUPIROCIN 2 % EX OINT
1.0000 "application " | TOPICAL_OINTMENT | Freq: Two times a day (BID) | CUTANEOUS | Status: AC
  Administered 2018-02-17 – 2018-02-21 (×9): 1 via NASAL
  Filled 2018-02-17: qty 22

## 2018-02-17 MED ORDER — CHLORHEXIDINE GLUCONATE CLOTH 2 % EX PADS
6.0000 | MEDICATED_PAD | Freq: Every day | CUTANEOUS | Status: AC
  Administered 2018-02-17 – 2018-02-21 (×4): 6 via TOPICAL

## 2018-02-17 MED ORDER — VANCOMYCIN HCL 10 G IV SOLR
2000.0000 mg | Freq: Once | INTRAVENOUS | Status: AC
  Administered 2018-02-17: 2000 mg via INTRAVENOUS
  Filled 2018-02-17 (×2): qty 2000

## 2018-02-17 MED ORDER — VANCOMYCIN HCL IN DEXTROSE 1-5 GM/200ML-% IV SOLN
1000.0000 mg | INTRAVENOUS | Status: DC | PRN
  Filled 2018-02-17: qty 200

## 2018-02-17 NOTE — Progress Notes (Signed)
[place] [facility] [place] NAME: Dennis Walker    MR#:  [id number]  DATE OF BIRTH:  [birth year]  SUBJECTIVE:  CHIEF COMPLAINT:   Chief Complaint  Patient presents with  . Weakness   -No fevers today.  Admitted for bacteremia.  Seen during dialysis. -Complaints of myalgias and neck pain  REVIEW OF SYSTEMS:  Review of Systems  Constitutional: Positive for fever and malaise/fatigue. Negative for chills.  HENT: Negative for congestion, ear discharge, hearing loss and nosebleeds.   Eyes: Negative for blurred vision and double vision.  Respiratory: Negative for cough, shortness of breath and wheezing.   Cardiovascular: Negative for chest pain, palpitations and leg swelling.  Gastrointestinal: Negative for abdominal pain, constipation, diarrhea, nausea and vomiting.  Genitourinary: Negative for dysuria.  Musculoskeletal: Positive for myalgias and neck pain.  Neurological: Negative for dizziness, focal weakness, seizures, weakness and headaches.  Psychiatric/Behavioral: Negative for depression.    DRUG ALLERGIES:   Allergies  Allergen Reactions  . Cefuroxime Itching  . Shrimp [Shellfish Allergy] Other (See Comments)    Per mar. Unknown reaction   . Sulfa Antibiotics Itching and Rash    VITALS:  Blood pressure (!) 155/73, pulse 79, temperature 98.1 F (36.7 C), temperature source Oral, resp. rate 18, height 5\' 11"  (1.803 m), weight 85.7 kg, SpO2 100 %.  PHYSICAL EXAMINATION:  Physical Exam  GENERAL:  67 y.o.-year-old patient lying in the bed with no acute distress.  EYES: left Pupil equal, round, reactive to light and accommodation. Right eye enucleated. No scleral icterus. Extraocular muscles intact.  HEENT: Head atraumatic, normocephalic. Oropharynx and nasopharynx clear.  NECK:  Supple, no jugular venous distention. No thyroid enlargement, no tenderness.  LUNGS: Normal breath sounds bilaterally, no wheezing, rales,rhonchi or  crepitation. No use of accessory muscles of respiration.  Decreased bibasilar breath sounds CARDIOVASCULAR: S1, S2 normal. No murmurs, rubs, or gallops.  ABDOMEN: Soft, nontender, nondistended. Bowel sounds present. No organomegaly or mass.  EXTREMITIES: No pedal edema, cyanosis, or clubbing. -Status post right BKA.  Left upper arm AV graft present NEUROLOGIC: Cranial nerves II through XII are intact. Muscle strength 5/5 in all extremities. Sensation intact. Gait not checked.  PSYCHIATRIC: The patient is alert and oriented x 3.  SKIN: No obvious rash, lesion, or ulcer.    LABORATORY PANEL:   CBC Recent Labs  Lab 02/17/18 0548  WBC 6.2  HGB 11.4*  HCT 35.0*  PLT 268   ------------------------------------------------------------------------------------------------------------------  Chemistries  Recent Labs  Lab 02/16/18 1026 02/17/18 0548  NA 135 137  K 3.1* 3.0*  CL 97* 99  CO2 28 29  GLUCOSE 177* 139*  BUN 31* 41*  CREATININE 4.42* 5.38*  CALCIUM 8.6* 8.3*  AST 21  --   ALT 11  --   ALKPHOS 103  --   BILITOT 0.9  --    ------------------------------------------------------------------------------------------------------------------  Cardiac Enzymes Recent Labs  Lab 02/16/18 1026  TROPONINI 0.09*   ------------------------------------------------------------------------------------------------------------------  RADIOLOGY:  Dg Chest Port 1 View  Result Date: 02/16/2018 CLINICAL DATA:  Weakness EXAM: PORTABLE CHEST 1 VIEW COMPARISON:  01/23/2018 FINDINGS: Cardiomegaly with vascular congestion. No overt edema. Bibasilar atelectasis. Small effusions. No acute bony abnormality. IMPRESSION: Cardiomegaly with vascular congestion and bibasilar atelectasis. Electronically Signed   By: Rolm Baptise M.D.   On: 02/16/2018 10:25    EKG:   Orders placed or performed during the hospital encounter of 02/16/18  . ED EKG 12-Lead  . ED EKG 12-Lead  .  EKG 12-Lead  . EKG  12-Lead    ASSESSMENT AND PLAN:   67 year old African-American male with past medical history significant for end-stage renal disease on Tuesday Thursday Saturday hemodialysis, hypertension, peripheral vascular disease, CAD, CHF presents to hospital secondary to generalized body aches and noted to be bacteremic  1.  Bacteremia-outpatient blood cultures growing gram-negative rods, history of MRSA bacteremia -Inpatient blood cultures BC ID positive for staph species especially methicillin-resistant -Concern for graft infection -Currently on vancomycin and Zosyn -Infectious disease consult requested -Order echocardiogram to rule out vegetation  2.  Hypertension-on low-dose lisinopril and metoprolol  3.  Referral vascular disease-on Plavix  4.  DVT prophylaxis-subcutaneous heparin   All the records are reviewed and case discussed with Care Management/Social Workerr. Management plans discussed with the patient, family and they are in agreement.  CODE STATUS: Full code  TOTAL TIME TAKING CARE OF THIS PATIENT: 38 minutes.   POSSIBLE D/C IN 2 DAYS, DEPENDING ON CLINICAL CONDITION.   Gladstone Lighter M.D on 02/17/2018 at 7:35 PM  Between 7am to 6pm - Pager - [phone]  After 6pm go to [email or web address] - password [email or web address] [facility]  Office  [phone]  CC: Primary care physician; Juluis Pitch, MD

## 2018-02-17 NOTE — Care Management Obs Status (Signed)
MEDICARE OBSERVATION STATUS NOTIFICATION   Patient Details  Name: Dennis Walker MRN: [id number] Date of Birth: 1951-06-18   Medicare Observation Status Notification Given:  Yes    Kenry Daubert A Donnabelle Blanchard, RN 02/17/2018, 8:24 AM

## 2018-02-17 NOTE — Progress Notes (Signed)
[place], [place] 02/17/18  Subjective:   Patient known to our practice from outpatient dialysis.  He presents for pain all over his body.  He was also found to have gram-negative bacteremia outpatient.  He was brought to the emergency room for evaluation.  Patient had a angioplasty and stent placement to his left arm AV access on August 7.  Percutaneous angioplasty was done and stent was placed to the peripheral segment of the graft.  Repeat blood cultures this admission are positive for MRSA.  Vancomycin has been added to patient's regimen He was seen during dialysis.  He complains of neck pain   HEMODIALYSIS FLOWSHEET:  Blood Flow Rate (mL/min): 350 mL/min Arterial Pressure (mmHg): -150 mmHg Venous Pressure (mmHg): 160 mmHg Transmembrane Pressure (mmHg): 50 mmHg Ultrafiltration Rate (mL/min): 140 mL/min Dialysate Flow Rate (mL/min): 800 ml/min Conductivity: Machine : 13.7 Conductivity: Machine : 13.7 Dialysis Fluid Bolus: Normal Saline Bolus Amount (mL): 250 mL    Objective:  Vital signs in last 24 hours:  Temp:  [97.8 F (36.6 C)-99.2 F (37.3 C)] 98.4 F (36.9 C) (08/17 1100) Pulse Rate:  [68-73] 70 (08/17 1200) Resp:  [8-26] 21 (08/17 1200) BP: (119-191)/(50-84) 153/72 (08/17 1145) SpO2:  [99 %-100 %] 100 % (08/17 0843)  Weight change:  Filed Weights   02/16/18 0944  Weight: 85.7 kg    Intake/Output:   No intake or output data in the 24 hours ending 02/17/18 1211   Physical Exam: General:  Chronically ill-appearing, no acute distress  HEENT  moist oral mucous membranes  Neck  supple  Pulm/lungs  normal breathing effort, clear to auscultation  CVS/Heart  no rub or gallop  Abdomen:   Soft, nontender  Extremities:  No peripheral edema, right BKA  Neurologic:  Alert, oriented, able to follow commands  Skin:  Tight skin over legs, no acute rashes  Access:  Left arm AVG       Basic Metabolic Panel:  Recent Labs  Lab  02/16/18 1026 02/17/18 0548  NA 135 137  K 3.1* 3.0*  CL 97* 99  CO2 28 29  GLUCOSE 177* 139*  BUN 31* 41*  CREATININE 4.42* 5.38*  CALCIUM 8.6* 8.3*  PHOS  --  3.2     CBC: Recent Labs  Lab 02/16/18 1026 02/17/18 0548  WBC 9.7 6.2  NEUTROABS 6.7*  --   HGB 12.2* 11.4*  HCT 37.6* 35.0*  MCV 85.1 84.8  PLT 251 268      Lab Results  Component Value Date   HEPBSAG Negative 12/26/2017   HEPBSAB Non Reactive 12/26/2017      Microbiology:  Recent Results (from the past 240 hour(s))  Blood Culture (routine x 2)     Status: None (Preliminary result)   Collection Time: 02/16/18 10:35 AM  Result Value Ref Range Status   Specimen Description BLOOD RT [facility]  Final   Special Requests   Final    BOTTLES DRAWN AEROBIC AND ANAEROBIC Blood Culture adequate volume   Culture   Final    NO GROWTH < 24 HOURS Performed at [facility], [address] [place], [place] [address]    Report Status PENDING  Incomplete  Blood Culture (routine x 2)     Status: None (Preliminary result)   Collection Time: 02/16/18 11:46 AM  Result Value Ref Range Status   Specimen Description BLOOD R HAND  Final   Special Requests   Final    BOTTLES DRAWN AEROBIC AND ANAEROBIC  Blood Culture adequate volume   Culture  Setup Time   Final    Organism ID to follow GRAM POSITIVE COCCI ANAEROBIC BOTTLE ONLY CRITICAL RESULT CALLED TO, READ BACK BY AND VERIFIED WITH: [facility] ZOMPA AT [facility] 02/17/18 [place] Performed at [facility], [address] [place], [place] [address]    Culture [facility] POSITIVE COCCI  Final   Report Status PENDING  Incomplete  Blood Culture ID Panel (Reflexed)     Status: Abnormal   Collection Time: 02/16/18 11:46 AM  Result Value Ref Range Status   Enterococcus species NOT DETECTED NOT DETECTED Final   Listeria monocytogenes NOT DETECTED NOT DETECTED Final   Staphylococcus species DETECTED (A) NOT DETECTED Final    Comment: CRITICAL RESULT CALLED TO, READ BACK BY AND  VERIFIED WITH:  HANK ZOMPA AT 0818 02/17/18 SDR    Staphylococcus aureus DETECTED (A) NOT DETECTED Final    Comment: Methicillin (oxacillin)-resistant Staphylococcus aureus (MRSA). MRSA is predictably resistant to beta-lactam antibiotics (except ceftaroline). Preferred therapy is vancomycin unless clinically contraindicated. Patient requires contact precautions if  hospitalized. CRITICAL RESULT CALLED TO, READ BACK BY AND VERIFIED WITH:  HANK ZOMPA AT 0818 02/17/18 SDR    Methicillin resistance DETECTED (A) NOT DETECTED Final    Comment: CRITICAL RESULT CALLED TO, READ BACK BY AND VERIFIED WITH: HANK ZOMPA AT 0818 02/17/18 SDR    Streptococcus species NOT DETECTED NOT DETECTED Final   Streptococcus agalactiae NOT DETECTED NOT DETECTED Final   Streptococcus pneumoniae NOT DETECTED NOT DETECTED Final   Streptococcus pyogenes NOT DETECTED NOT DETECTED Final   Acinetobacter baumannii NOT DETECTED NOT DETECTED Final   Enterobacteriaceae species NOT DETECTED NOT DETECTED Final   Enterobacter cloacae complex NOT DETECTED NOT DETECTED Final   Escherichia coli NOT DETECTED NOT DETECTED Final   Klebsiella oxytoca NOT DETECTED NOT DETECTED Final   Klebsiella pneumoniae NOT DETECTED NOT DETECTED Final   Proteus species NOT DETECTED NOT DETECTED Final   Serratia marcescens NOT DETECTED NOT DETECTED Final   Haemophilus influenzae NOT DETECTED NOT DETECTED Final   Neisseria meningitidis NOT DETECTED NOT DETECTED Final   Pseudomonas aeruginosa NOT DETECTED NOT DETECTED Final   Candida albicans NOT DETECTED NOT DETECTED Final   Candida glabrata NOT DETECTED NOT DETECTED Final   Candida krusei NOT DETECTED NOT DETECTED Final   Candida parapsilosis NOT DETECTED NOT DETECTED Final   Candida tropicalis NOT DETECTED NOT DETECTED Final    Comment: Performed at [facility], [address] [place], [place] [address]  MRSA PCR Screening     Status: Abnormal   Collection Time: 02/16/18  6:05 PM   Result Value Ref Range Status   MRSA by PCR POSITIVE (A) NEGATIVE Final    Comment:        The GeneXpert MRSA Assay (FDA approved for NASAL specimens only), is one component of a comprehensive MRSA colonization surveillance program. It is not intended to diagnose MRSA infection nor to guide or monitor treatment for MRSA infections. RESULT CALLED TO, READ BACK BY AND VERIFIED WITH: Noah Delaine AT 0175 02/16/18.PMH Performed at [facility], [address] [place], [place] [address]     Coagulation Studies: No results for input(s): LABPROT, INR in the last 72 hours.  Urinalysis: No results for input(s): COLORURINE, LABSPEC, PHURINE, GLUCOSEU, HGBUR, BILIRUBINUR, KETONESUR, PROTEINUR, UROBILINOGEN, NITRITE, LEUKOCYTESUR in the last 72 hours.  Invalid input(s): APPERANCEUR    Imaging: Dg Chest Port 1 View  Result Date: 02/16/2018 CLINICAL DATA:  Weakness EXAM:  PORTABLE CHEST 1 VIEW COMPARISON:  01/23/2018 FINDINGS: Cardiomegaly with vascular congestion. No overt edema. Bibasilar atelectasis. Small effusions. No acute bony abnormality. IMPRESSION: Cardiomegaly with vascular congestion and bibasilar atelectasis. Electronically Signed   By: Rolm Baptise M.D.   On: 02/16/2018 10:25     Medications:   . sodium chloride    . piperacillin-tazobactam (ZOSYN)  IV 3.375 g (02/17/18 0612)  . vancomycin     . aspirin EC  81 mg Oral q1800  . atorvastatin  40 mg Oral q1800  . B-complex with vitamin C  1 tablet Oral q1800  . Chlorhexidine Gluconate Cloth  6 each Topical Q0600  . cholecalciferol  1,000 Units Oral Daily  . clopidogrel  75 mg Oral q1800  . feeding supplement (NEPRO CARB STEADY)  237 mL Oral TID BM  . ferrous sulfate  325 mg Oral q1800  . gabapentin  200 mg Oral TID  . heparin  5,000 Units Subcutaneous Q8H  . insulin aspart  0-15 Units Subcutaneous TID WC  . insulin aspart  0-5 Units Subcutaneous QHS  . lactulose  20 g Oral QHS  . lidocaine-prilocaine   1 application Topical Q T,Th,Sa-HD  . lisinopril  5 mg Oral q1800  . mouth rinse  15 mL Mouth Rinse BID  . metoprolol succinate  12.5 mg Oral BID  . multivitamin with minerals   Oral QHS  . mupirocin ointment  1 application Nasal BID  . polyethylene glycol  17 g Oral q1800  . polyvinyl alcohol  1 drop Left Eye QID  . senna  8.6 mg Oral BID  . sertraline  100 mg Oral QHS  . sodium chloride flush  3 mL Intravenous Q12H  . ascorbic acid  500 mg Oral BID   sodium chloride, acetaminophen, benzocaine, hydrocerin, nitroGLYCERIN, ondansetron **OR** ondansetron (ZOFRAN) IV, oxyCODONE, sodium chloride flush, traMADol  Assessment/ Plan:  67 y.o. African American male with end stage renal disease onHemodialysis, diabetes mellitus type II, hypertension, peripheral vascular disease, coronary artery disease, hyperlipidemia, congestive heart failure, left BKA, hospitalization on6/23/2019forAMS with respiratory failure, left AV graft angioplasty and stent placement February 07, 2018, hospitalization, gram-negative bacteremia 02/16/2018  TTS [facility] left AVG EDW 80 kg Resident of [facility]   Gram-negative and MRSA bacteremia.  Source unclear ?  AV graft versus other  ESRD  Anemia of CKD  SHPTH   Patient is currently admitted under observation for evaluation of gram-negative bacteremia.   He recently completed vancomycin for MRSA bacteremia.  Outpatient follow-up cultures showed gram-negative bacteremia. He was started on IV Zosyn.  Repeat intervention cultures are positive for MRSA bacteremia Now started on vancomycin monitor phos level Consider ID evaluation ?  Source AV graft versus need for further evaluation with TEE and perhaps imaging of spine We will continue to follow     LOS: 0 Mariena Meares 8/17/201912:11 PM  [place], [place]  Note: This note was prepared with Dragon dictation. Any transcription errors are  unintentional

## 2018-02-17 NOTE — Progress Notes (Signed)
This note also relates to the following rows which could not be included: Pulse Rate - Cannot attach notes to unvalidated device data Resp - Cannot attach notes to unvalidated device data BP - Cannot attach notes to unvalidated device data  Hd completed  

## 2018-02-17 NOTE — Care Management Note (Signed)
Case Management Note  Patient Details  Name: Dennis Walker MRN: [id number] Date of Birth: May 15, 1951  Subjective/Objective:   Patient admitted to [facility] under observation status for bacteremia. RNCM consulted on patient to provide MOON letter and complete assessment. Patient currently from [facility] long term care. Patient is HD T/Th/Sat. Patient uses a walker and wheelchair at [facility]. Plans will be to discharge back to [facility].                   Action/Plan:  RNCM to continue to follow for any needs.  Expected Discharge Date:                  Expected Discharge Plan:     In-House Referral:     Discharge planning Services     Post Acute Care Choice:    Choice offered to:     DME Arranged:    DME Agency:     HH Arranged:    HH Agency:     Status of Service:     If discussed at [employer], dates discussed:    Additional Comments:  Latanya Maudlin, RN 02/17/2018, 8:27 AM

## 2018-02-17 NOTE — Progress Notes (Signed)
This note also relates to the following rows which could not be included: Pulse Rate - Cannot attach notes to unvalidated device data Resp - Cannot attach notes to unvalidated device data BP - Cannot attach notes to unvalidated device data SpO2 - Cannot attach notes to unvalidated device data  Hd started  

## 2018-02-17 NOTE — Progress Notes (Signed)
Pharmacy Antibiotic Note  Dennis Walker is a 67 y.o. male admitted on 02/16/2018 with gram negative bacteremia.  Pharmacy has been consulted for Vancomycin dosing.  Plan: Vancomycin 2g IV loading dose, followed by Vancomycin 1g IV to be given toward the end of each dialysis session. Will order a trough level prior to every 3rd dialysis session.  Target trough level = 15-25 mcg/ml.    Height: 5\' 11"  (180.3 cm) Weight: 189 lb (85.7 kg) IBW/kg (Calculated) : 75.3  Temp (24hrs), Avg:98.5 F (36.9 C), Min:97.8 F (36.6 C), Max:99.2 F (37.3 C)  Recent Labs  Lab 02/16/18 1025 02/16/18 1026 02/16/18 1539 02/17/18 0548  WBC  --  9.7  --  6.2  CREATININE  --  4.42*  --  5.38*  LATICACIDVEN 1.2  --  0.8  --     Estimated Creatinine Clearance: 14.4 mL/min (A) (by C-G formula based on SCr of 5.38 mg/dL (H)).    Allergies  Allergen Reactions  . Cefuroxime Itching  . Shrimp [Shellfish Allergy] Other (See Comments)    Per mar. Unknown reaction   . Sulfa Antibiotics Itching and Rash    Antimicrobials this admission: Zosyn 8/16 >>  Vancomycin 8/17 >>  Dose adjustments this admission:  Microbiology results: 8/16 BCx: GPC,Staph Aureus, Mec A + 8/16 MRSA PCR: positve  Thank you for allowing pharmacy to be a part of this patient's care.  Olivia Canter, [facility] 02/17/2018 2:25 PM

## 2018-02-18 ENCOUNTER — Inpatient Hospital Stay (HOSPITAL_COMMUNITY)
Admit: 2018-02-18 | Discharge: 2018-02-18 | Disposition: A | Discharge status: Home / Self Care | Payer: Medicare Other | Attending: Internal Medicine | Admitting: Internal Medicine

## 2018-02-18 DIAGNOSIS — B9562 Methicillin resistant Staphylococcus aureus infection as the cause of diseases classified elsewhere: Secondary | ICD-10-CM

## 2018-02-18 DIAGNOSIS — I340 Nonrheumatic mitral (valve) insufficiency: Secondary | ICD-10-CM

## 2018-02-18 DIAGNOSIS — R7881 Bacteremia: Secondary | ICD-10-CM

## 2018-02-18 DIAGNOSIS — T827XXA Infection and inflammatory reaction due to other cardiac and vascular devices, implants and grafts, initial encounter: Secondary | ICD-10-CM

## 2018-02-18 LAB — CBC
HCT: 33.8 % — ABNORMAL LOW (ref 40.0–52.0)
HEMOGLOBIN: 11 g/dL — AB (ref 13.0–18.0)
MCH: 27.7 pg (ref 26.0–34.0)
MCHC: 32.4 g/dL (ref 32.0–36.0)
MCV: 85.5 fL (ref 80.0–100.0)
PLATELETS: 262 10*3/uL (ref 150–440)
RBC: 3.95 MIL/uL — AB (ref 4.40–5.90)
RDW: 18 % — ABNORMAL HIGH (ref 11.5–14.5)
WBC: 7.1 10*3/uL (ref 3.8–10.6)

## 2018-02-18 LAB — GLUCOSE, CAPILLARY
GLUCOSE-CAPILLARY: 135 mg/dL — AB (ref 70–99)
GLUCOSE-CAPILLARY: 153 mg/dL — AB (ref 70–99)
GLUCOSE-CAPILLARY: 154 mg/dL — AB (ref 70–99)
Glucose-Capillary: 168 mg/dL — ABNORMAL HIGH (ref 70–99)

## 2018-02-18 LAB — BASIC METABOLIC PANEL
Anion gap: 9 (ref 5–15)
BUN: 23 mg/dL (ref 8–23)
CHLORIDE: 99 mmol/L (ref 98–111)
CO2: 30 mmol/L (ref 22–32)
CREATININE: 3.85 mg/dL — AB (ref 0.61–1.24)
Calcium: 8.5 mg/dL — ABNORMAL LOW (ref 8.9–10.3)
GFR, EST AFRICAN AMERICAN: 17 mL/min — AB (ref >60)
GFR, EST NON AFRICAN AMERICAN: 15 mL/min — AB (ref >60)
Glucose, Bld: 146 mg/dL — ABNORMAL HIGH (ref 70–99)
POTASSIUM: 3.2 mmol/L — AB (ref 3.5–5.1)
SODIUM: 138 mmol/L (ref 135–145)

## 2018-02-18 LAB — ECHOCARDIOGRAM COMPLETE
Height: 71 in
WEIGHTICAEL: 3024.01 [oz_av]

## 2018-02-18 MED ORDER — MORPHINE SULFATE (PF) 2 MG/ML IV SOLN
2.0000 mg | Freq: Once | INTRAVENOUS | Status: AC
  Administered 2018-02-18: 2 mg via INTRAVENOUS
  Filled 2018-02-18: qty 1

## 2018-02-18 MED ORDER — IBUPROFEN 400 MG PO TABS
400.0000 mg | ORAL_TABLET | Freq: Four times a day (QID) | ORAL | Status: DC | PRN
  Administered 2018-02-18 – 2018-02-20 (×3): 400 mg via ORAL
  Filled 2018-02-18 (×3): qty 1

## 2018-02-18 NOTE — Progress Notes (Signed)
Spoke with nephrology-outpatient blood cultures final results came back as no growth. -Discussed with ID-discontinue Zosyn.  Continue vancomycin for MRSA bacteremia. -Concern for AV graft infection.  Whether to do ultrasound or do a WBC tagged scan to diagnose that infection per vascular. -Vascular team consulted
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
## 2018-02-18 NOTE — Progress Notes (Addendum)
[facility] [facility] [place] NAME: Jeren Dufrane    MR#:  [id number]  DATE OF BIRTH:  1951/03/13  SUBJECTIVE:  CHIEF COMPLAINT:   Chief Complaint  Patient presents with  . Weakness   - complains of pain all over and myalgias - no fevers  REVIEW OF SYSTEMS:  Review of Systems  Constitutional: Positive for fever and malaise/fatigue. Negative for chills.  HENT: Negative for congestion, ear discharge, hearing loss and nosebleeds.   Eyes: Negative for blurred vision and double vision.  Respiratory: Negative for cough, shortness of breath and wheezing.   Cardiovascular: Negative for chest pain, palpitations and leg swelling.  Gastrointestinal: Negative for abdominal pain, constipation, diarrhea, nausea and vomiting.  Genitourinary: Negative for dysuria.  Musculoskeletal: Positive for myalgias and neck pain.  Neurological: Negative for dizziness, focal weakness, seizures, weakness and headaches.  Psychiatric/Behavioral: Negative for depression.    DRUG ALLERGIES:   Allergies  Allergen Reactions  . Cefuroxime Itching  . Shrimp [Shellfish Allergy] Other (See Comments)    Per mar. Unknown reaction   . Sulfa Antibiotics Itching and Rash    VITALS:  Blood pressure (!) 174/80, pulse 71, temperature 98.3 F (36.8 C), temperature source Oral, resp. rate 11, height 5\' 11"  (1.803 m), weight 85.7 kg, SpO2 100 %.  PHYSICAL EXAMINATION:  Physical Exam  GENERAL:  67 y.o.-year-old patient lying in the bed with no acute distress.  EYES: left Pupil equal, round, reactive to light and accommodation. Right eye enucleated. No scleral icterus. Extraocular muscles intact.  HEENT: Head atraumatic, normocephalic. Oropharynx and nasopharynx clear.  NECK:  Supple, no jugular venous distention. No thyroid enlargement, no tenderness.  LUNGS: Normal breath sounds bilaterally, no wheezing, rales,rhonchi or crepitation. No use of accessory muscles of respiration.   Decreased bibasilar breath sounds CARDIOVASCULAR: S1, S2 normal. No murmurs, rubs, or gallops.  ABDOMEN: Soft, nontender, nondistended. Bowel sounds present. No organomegaly or mass.  EXTREMITIES: No pedal edema, cyanosis, or clubbing. -Status post right BKA.  Left upper arm AV graft present NEUROLOGIC: Cranial nerves II through XII are intact. Muscle strength 5/5 in all extremities. Sensation intact. Gait not checked.  PSYCHIATRIC: The patient is alert and oriented x 3.  SKIN: No obvious rash, lesion, or ulcer.    LABORATORY PANEL:   CBC Recent Labs  Lab 02/18/18 0321  WBC 7.1  HGB 11.0*  HCT 33.8*  PLT 262   ------------------------------------------------------------------------------------------------------------------  Chemistries  Recent Labs  Lab 02/16/18 1026  02/18/18 0321  NA 135   < > 138  K 3.1*   < > 3.2*  CL 97*   < > 99  CO2 28   < > 30  GLUCOSE 177*   < > 146*  BUN 31*   < > 23  CREATININE 4.42*   < > 3.85*  CALCIUM 8.6*   < > 8.5*  AST 21  --   --   ALT 11  --   --   ALKPHOS 103  --   --   BILITOT 0.9  --   --    < > = values in this interval not displayed.   ------------------------------------------------------------------------------------------------------------------  Cardiac Enzymes Recent Labs  Lab 02/16/18 1026  TROPONINI 0.09*   ------------------------------------------------------------------------------------------------------------------  RADIOLOGY:  No results found.  EKG:   Orders placed or performed during the hospital encounter of 02/16/18  . ED EKG 12-Lead  . ED EKG 12-Lead  . EKG 12-Lead  . EKG 12-Lead  ASSESSMENT AND PLAN:   67 year old African-American male with past medical history significant for end-stage renal disease on Tuesday Thursday Saturday hemodialysis, hypertension, peripheral vascular disease, CAD, CHF presents to hospital secondary to generalized body aches and noted to be bacteremic  1.   Bacteremia- -history history of MRSA bacteremia few months ago. -Status post angioplasty and stent placement in the left upper extremity AV graft 2 weeks ago.  Outpatient blood cultures at dialysis center growing gram-negative rods. -Repeat blood cultures as inpatient growing  [facility]- BC ID positive for staph species especially methicillin-resistant -Concern for graft infection -on vancomycin and Zosyn -Infectious disease consult requested -echocardiogram to rule out vegetation - ID recommends tagged WBC scan for graft infection  2.  Hypertension-on low-dose lisinopril and metoprolol  3.  Referral vascular disease-on Plavix  4.  DVT prophylaxis-subcutaneous heparin  5. Hypokalemia- corrected with dialysis   All the records are reviewed and case discussed with Care Management/Social Workerr. Management plans discussed with the patient, family and they are in agreement.  CODE STATUS: Full code  TOTAL TIME TAKING CARE OF THIS PATIENT: 38 minutes.   POSSIBLE D/C IN 2 DAYS, DEPENDING ON CLINICAL CONDITION.   Gladstone Lighter M.D on 02/18/2018 at 12:31 PM  Between 7am to 6pm - Pager - [phone]  After 6pm go to [email or web address] - password [email or web address] [facility]  Office  [phone]  CC: Primary care physician; Juluis Pitch, MD
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
## 2018-02-19 ENCOUNTER — Inpatient Hospital Stay: Admit: 2018-02-19 | Payer: Medicare Other

## 2018-02-19 ENCOUNTER — Inpatient Hospital Stay: Payer: Medicare Other

## 2018-02-19 LAB — CULTURE, BLOOD (ROUTINE X 2): SPECIAL REQUESTS: ADEQUATE

## 2018-02-19 LAB — GLUCOSE, CAPILLARY
GLUCOSE-CAPILLARY: 120 mg/dL — AB (ref 70–99)
GLUCOSE-CAPILLARY: 149 mg/dL — AB (ref 70–99)
GLUCOSE-CAPILLARY: 125 mg/dL — AB (ref 70–99)
Glucose-Capillary: 142 mg/dL — ABNORMAL HIGH (ref 70–99)

## 2018-02-19 LAB — BASIC METABOLIC PANEL
ANION GAP: 11 (ref 5–15)
BUN: 33 mg/dL — ABNORMAL HIGH (ref 8–23)
CHLORIDE: 97 mmol/L — AB (ref 98–111)
CO2: 29 mmol/L (ref 22–32)
Calcium: 8.4 mg/dL — ABNORMAL LOW (ref 8.9–10.3)
Creatinine, Ser: 5.02 mg/dL — ABNORMAL HIGH (ref 0.61–1.24)
GFR calc Af Amer: 13 mL/min — ABNORMAL LOW (ref >60)
GFR calc non Af Amer: 11 mL/min — ABNORMAL LOW (ref >60)
Glucose, Bld: 143 mg/dL — ABNORMAL HIGH (ref 70–99)
Potassium: 3.3 mmol/L — ABNORMAL LOW (ref 3.5–5.1)
Sodium: 137 mmol/L (ref 135–145)

## 2018-02-19 MED ORDER — TRAMADOL HCL 50 MG PO TABS
50.0000 mg | ORAL_TABLET | Freq: Two times a day (BID) | ORAL | Status: DC | PRN
  Administered 2018-02-19: 50 mg via ORAL
  Filled 2018-02-19: qty 1

## 2018-02-19 MED ORDER — SODIUM CHLORIDE 0.9 % IV SOLN
INTRAVENOUS | Status: DC
  Administered 2018-02-19: 08:00:00 via INTRAVENOUS

## 2018-02-19 MED ORDER — DOCUSATE SODIUM 100 MG PO CAPS
200.0000 mg | ORAL_CAPSULE | Freq: Two times a day (BID) | ORAL | Status: DC
  Administered 2018-02-19 – 2018-02-23 (×7): 200 mg via ORAL
  Filled 2018-02-19 (×7): qty 2

## 2018-02-19 MED ORDER — SODIUM CHLORIDE 0.9 % IV SOLN: INTRAVENOUS | Status: DC

## 2018-02-19 MED ORDER — VANCOMYCIN HCL IN DEXTROSE 1-5 GM/200ML-% IV SOLN
1000.0000 mg | INTRAVENOUS | Status: DC
  Administered 2018-02-20: 1000 mg via INTRAVENOUS
  Filled 2018-02-19 (×4): qty 200

## 2018-02-19 NOTE — Progress Notes (Signed)
Consulted for a transesophageal echocardiogram given MRSA bacteremia.  Patient had a transthoracic echocardiogram that showed no vegetation.  I discussed the procedure in detail with the patient as well as risks and benefits.  The patient does not appear to have any contraindication for the procedure. It is scheduled for tomorrow morning to be done by Dr. Saunders Revel.

## 2018-02-19 NOTE — Progress Notes (Signed)
Per MD patient will need to be in the hospital for several more days. Tammy admissions coordinator at [facility] is aware of above.   [employer], LCSW [phone]

## 2018-02-19 NOTE — Care Management (Signed)
Dennis Walker with [employer] notified of HD patient.

## 2018-02-19 NOTE — Progress Notes (Signed)
[place] at [place] NAME: Dennis Walker    MR#:  [id number]  DATE OF BIRTH:  Jan 01, 1951  SUBJECTIVE:  CHIEF COMPLAINT:   Chief Complaint  Patient presents with  . Weakness  Patient complains of mild constipation only, no events overnight, vascular/nephrology/infectious disease input appreciated, for TEE, repeat blood cultures negative  REVIEW OF SYSTEMS:  CONSTITUTIONAL: No fever, fatigue or weakness.  EYES: No blurred or double vision.  EARS, NOSE, AND THROAT: No tinnitus or ear pain.  RESPIRATORY: No cough, shortness of breath, wheezing or hemoptysis.  CARDIOVASCULAR: No chest pain, orthopnea, edema.  GASTROINTESTINAL: No nausea, vomiting, diarrhea or abdominal pain.  GENITOURINARY: No dysuria, hematuria.  ENDOCRINE: No polyuria, nocturia,  HEMATOLOGY: No anemia, easy bruising or bleeding SKIN: No rash or lesion. MUSCULOSKELETAL: No joint pain or arthritis.   NEUROLOGIC: No tingling, numbness, weakness.  PSYCHIATRY: No anxiety or depression.   ROS  DRUG ALLERGIES:   Allergies  Allergen Reactions  . Cefuroxime Itching  . Shrimp [Shellfish Allergy] Other (See Comments)    Per mar. Unknown reaction   . Sulfa Antibiotics Itching and Rash    VITALS:  Blood pressure (!) 158/74, pulse 65, temperature 97.7 F (36.5 C), temperature source Oral, resp. rate 19, height 5\' 11"  (1.803 m), weight 85.7 kg, SpO2 100 %.  PHYSICAL EXAMINATION:  GENERAL:  67 y.o.-year-old patient lying in the bed with no acute distress.  EYES: Pupils equal, round, reactive to light and accommodation. No scleral icterus. Extraocular muscles intact.  HEENT: Head atraumatic, normocephalic. Oropharynx and nasopharynx clear.  NECK:  Supple, no jugular venous distention. No thyroid enlargement, no tenderness.  LUNGS: Normal breath sounds bilaterally, no wheezing, rales,rhonchi or crepitation. No use of accessory muscles of respiration.  CARDIOVASCULAR: S1, S2 normal.  No murmurs, rubs, or gallops.  ABDOMEN: Soft, nontender, nondistended. Bowel sounds present. No organomegaly or mass.  EXTREMITIES: No pedal edema, cyanosis, or clubbing.  NEUROLOGIC: Cranial nerves II through XII are intact. Muscle strength 5/5 in all extremities. Sensation intact. Gait not checked.  PSYCHIATRIC: The patient is alert and oriented x 3.  SKIN: No obvious rash, lesion, or ulcer.   Physical Exam LABORATORY PANEL:   CBC Recent Labs  Lab 02/18/18 0321  WBC 7.1  HGB 11.0*  HCT 33.8*  PLT 262   ------------------------------------------------------------------------------------------------------------------  Chemistries  Recent Labs  Lab 02/16/18 1026  02/19/18 0223  NA 135   < > 137  K 3.1*   < > 3.3*  CL 97*   < > 97*  CO2 28   < > 29  GLUCOSE 177*   < > 143*  BUN 31*   < > 33*  CREATININE 4.42*   < > 5.02*  CALCIUM 8.6*   < > 8.4*  AST 21  --   --   ALT 11  --   --   ALKPHOS 103  --   --   BILITOT 0.9  --   --    < > = values in this interval not displayed.   ------------------------------------------------------------------------------------------------------------------  Cardiac Enzymes Recent Labs  Lab 02/16/18 1026  TROPONINI 0.09*   ------------------------------------------------------------------------------------------------------------------  RADIOLOGY:  No results found.  ASSESSMENT AND PLAN:  67 year old African-American male with past medical history significant for end-stage renal disease on Tuesday Thursday Saturday hemodialysis, hypertension, peripheral vascular disease, CAD, CHF presents to hospital secondary to generalized body aches and noted to be bacteremic  *Acute recurrent MRSA bacteremia  Stable history of MRSA  bacteremia few months ago. S/p angioplasty and stent placement in the left upper extremity AV graft 2 weeks ago Repeat blood cultures thus far negative Vascular surgery input appreciated for repeat blood cultures  after hemodialysis on tomorrow, for fistulogram, echocardiogram negative for vegetations, for TEE Infectious disease consulted -follow-up on recommended tagged WBC scan  Continue vancomycin IV  *Chronic end-stage renal disease on hemodialysis Nephrology input appreciated Receives hemodialysis on TTS  *Hypertension Stable on lisinopril and metoprolol  Chronic peripheral vascular disease Continue Plavix  *Hypokalemia Correction with dialysis given ESRD   All the records are reviewed and case discussed with Care Management/Social Workerr. Management plans discussed with the patient, family and they are in agreement.  CODE STATUS: full  TOTAL TIME TAKING CARE OF THIS PATIENT: 40 minutes.     POSSIBLE D/C IN 3-5 DAYS, DEPENDING ON CLINICAL CONDITION.   Avel Peace Whyatt Klinger M.D on 02/19/2018   Between 7am to 6pm - Pager - [phone]  After 6pm go to [email or web address] - password [email or web address] [facility]  Office  [phone]  CC: Primary care physician; Juluis Pitch, MD  Note: This dictation was prepared with Dragon dictation along with smaller phrase technology. Any transcriptional errors that result from this process are unintentional.

## 2018-02-19 NOTE — H&P (View-Only) (Signed)
Consulted for a transesophageal echocardiogram given MRSA bacteremia.  Patient had a transthoracic echocardiogram that showed no vegetation.  I discussed the procedure in detail with the patient as well as risks and benefits.  The patient does not appear to have any contraindication for the procedure. It is scheduled for tomorrow morning to be done by Dr. Saunders Revel.

## 2018-02-20 ENCOUNTER — Inpatient Hospital Stay (HOSPITAL_COMMUNITY)
Admit: 2018-02-20 | Discharge: 2018-02-20 | Disposition: A | Discharge status: Home / Self Care | Payer: Medicare Other | Attending: Cardiovascular Disease | Admitting: Cardiovascular Disease

## 2018-02-20 ENCOUNTER — Encounter: Payer: Self-pay | Admitting: Internal Medicine

## 2018-02-20 ENCOUNTER — Encounter
Admission: EM | Disposition: A | Discharge status: Home / Self Care | Payer: Self-pay | Source: Home / Self Care | Attending: Internal Medicine

## 2018-02-20 DIAGNOSIS — R7881 Bacteremia: Secondary | ICD-10-CM

## 2018-02-20 HISTORY — PX: TEE WITHOUT CARDIOVERSION: SHX5443

## 2018-02-20 LAB — CBC
HCT: 31.9 % — ABNORMAL LOW (ref 40.0–52.0)
Hemoglobin: 10.4 g/dL — ABNORMAL LOW (ref 13.0–18.0)
MCH: 27.5 pg (ref 26.0–34.0)
MCHC: 32.6 g/dL (ref 32.0–36.0)
MCV: 84.4 fL (ref 80.0–100.0)
PLATELETS: 282 10*3/uL (ref 150–440)
RBC: 3.78 MIL/uL — ABNORMAL LOW (ref 4.40–5.90)
RDW: 17.5 % — AB (ref 11.5–14.5)
WBC: 6.7 10*3/uL (ref 3.8–10.6)

## 2018-02-20 LAB — PHOSPHORUS: Phosphorus: 4.2 mg/dL (ref 2.5–4.6)

## 2018-02-20 LAB — GLUCOSE, CAPILLARY
Glucose-Capillary: 140 mg/dL — ABNORMAL HIGH (ref 70–99)
Glucose-Capillary: 108 mg/dL — ABNORMAL HIGH (ref 70–99)
Glucose-Capillary: 114 mg/dL — ABNORMAL HIGH (ref 70–99)
Glucose-Capillary: 137 mg/dL — ABNORMAL HIGH (ref 70–99)

## 2018-02-20 SURGERY — ECHOCARDIOGRAM, TRANSESOPHAGEAL
Anesthesia: Moderate Sedation

## 2018-02-20 MED ORDER — FENTANYL CITRATE (PF) 100 MCG/2ML IJ SOLN
INTRAMUSCULAR | Status: AC | PRN
  Administered 2018-02-20: 25 ug via INTRAVENOUS

## 2018-02-20 MED ORDER — OXYCODONE HCL 5 MG PO TABS
5.0000 mg | ORAL_TABLET | ORAL | Status: DC | PRN
  Administered 2018-02-20 (×2): 5 mg via ORAL
  Filled 2018-02-20 (×2): qty 1

## 2018-02-20 MED ORDER — BUTAMBEN-TETRACAINE-BENZOCAINE 2-2-14 % EX AERO
INHALATION_SPRAY | CUTANEOUS | Status: AC
  Filled 2018-02-20: qty 5

## 2018-02-20 MED ORDER — OXYCODONE HCL 5 MG PO TABS
5.0000 mg | ORAL_TABLET | ORAL | Status: DC | PRN
  Administered 2018-02-20 – 2018-02-23 (×11): 10 mg via ORAL
  Filled 2018-02-20 (×11): qty 2

## 2018-02-20 MED ORDER — MIDAZOLAM HCL 2 MG/2ML IJ SOLN
INTRAMUSCULAR | Status: AC | PRN
  Administered 2018-02-20: 2 mg via INTRAVENOUS

## 2018-02-20 MED ORDER — MIDAZOLAM HCL 5 MG/5ML IJ SOLN
INTRAMUSCULAR | Status: AC
  Filled 2018-02-20: qty 5

## 2018-02-20 MED ORDER — SODIUM CHLORIDE FLUSH 0.9 % IV SOLN
INTRAVENOUS | Status: AC
  Filled 2018-02-20: qty 10

## 2018-02-20 MED ORDER — LIDOCAINE VISCOUS HCL 2 % MT SOLN
OROMUCOSAL | Status: AC
  Filled 2018-02-20: qty 15

## 2018-02-20 MED ORDER — FENTANYL CITRATE (PF) 100 MCG/2ML IJ SOLN
INTRAMUSCULAR | Status: AC
  Filled 2018-02-20: qty 2

## 2018-02-20 MED ORDER — TRAMADOL HCL 50 MG PO TABS
50.0000 mg | ORAL_TABLET | Freq: Two times a day (BID) | ORAL | Status: DC | PRN
  Administered 2018-02-21: 50 mg via ORAL
  Filled 2018-02-20: qty 1

## 2018-02-20 MED ORDER — HYDROMORPHONE HCL 1 MG/ML IJ SOLN
0.5000 mg | INTRAMUSCULAR | Status: DC | PRN
  Administered 2018-02-20 – 2018-02-23 (×6): 0.5 mg via INTRAVENOUS
  Filled 2018-02-20 (×6): qty 1

## 2018-02-20 NOTE — Progress Notes (Signed)
Pre HD Assessment    02/20/18 0930  Neurological  Level of Consciousness Alert  Orientation Level Oriented X4  Respiratory  Respiratory Pattern Regular  Chest Assessment Chest expansion symmetrical  Bilateral Breath Sounds Diminished  Cardiac  Pulse Regular  Heart Sounds S1, S2  Jugular Venous Distention (JVD) No  ECG Monitor Yes  Cardiac Rhythm NSR  Antiarrhythmic device No  Vascular  R Radial Pulse +1  L Radial Pulse +1  Integumentary  Integumentary (WDL) X  Skin Color Appropriate for ethnicity  Skin Condition Dry;Flaky  Musculoskeletal  Musculoskeletal (WDL) X  Generalized Weakness Yes  Gastrointestinal  Bowel Sounds Assessment Hypoactive  GU Assessment  Genitourinary (WDL) X (HD pt)  Psychosocial  Psychosocial (WDL) WDL

## 2018-02-20 NOTE — Progress Notes (Signed)
Post HD Treatment  Pt tolerated treatment well, but treatment was terminated 15 minutes earlier due to system clotting. MD aware. Report given to Daleen Snook, she requests to [facility] to [facility]. BVP was 71.9. Patient's net UF was 1533.     02/20/18 1330  Hand-Off documentation  Report given to (Full Name) Daleen Snook, RN  Report received from (Full Name) Stephannie Peters, RN (Vicente Males reports to phone [facility] for patient)  Vital Signs  Temp 98.6 F (37 C)  Temp Source Oral  Pulse Rate 77  Pulse Rate Source Monitor  Resp (!) 21  BP (!) 179/76  BP Location Right Arm  BP Method Automatic  Patient Position (if appropriate) Lying  Oxygen Therapy  SpO2 100 %  O2 Device Room Air  Dialysis Weight  Weight 79.7 kg  Type of Weight Post-Dialysis  Post-Hemodialysis Assessment  Rinseback Volume (mL) 250 mL  KECN 71.9 V  Dialyzer Clearance Lightly streaked  Duration of HD Treatment -hour(s) 3.25 hour(s)  Hemodialysis Intake (mL) 700 mL  UF Total -Machine (mL) 2333 mL  Net UF (mL) 1633 mL  Tolerated HD Treatment Yes  AVG/AVF Arterial Site Held (minutes) 10 minutes  AVG/AVF Venous Site Held (minutes) 10 minutes

## 2018-02-20 NOTE — Progress Notes (Signed)
*  PRELIMINARY RESULTS* Echocardiogram Echocardiogram Transesophageal has been performed.  Dennis Walker 02/20/2018, 3:11 PM

## 2018-02-20 NOTE — Progress Notes (Signed)
[place] [facility] [place] NAME: Dennis Walker    MR#:  [id number]  DATE OF BIRTH:  [birth year]  SUBJECTIVE:  CHIEF COMPLAINT:   Chief Complaint  Patient presents with  . Weakness  Patient without complaint, currently receiving hemodialysis, patient without complaint  REVIEW OF SYSTEMS:  CONSTITUTIONAL: No fever, fatigue or weakness.  EYES: No blurred or double vision.  EARS, NOSE, AND THROAT: No tinnitus or ear pain.  RESPIRATORY: No cough, shortness of breath, wheezing or hemoptysis.  CARDIOVASCULAR: No chest pain, orthopnea, edema.  GASTROINTESTINAL: No nausea, vomiting, diarrhea or abdominal pain.  GENITOURINARY: No dysuria, hematuria.  ENDOCRINE: No polyuria, nocturia,  HEMATOLOGY: No anemia, easy bruising or bleeding SKIN: No rash or lesion. MUSCULOSKELETAL: No joint pain or arthritis.   NEUROLOGIC: No tingling, numbness, weakness.  PSYCHIATRY: No anxiety or depression.   ROS  DRUG ALLERGIES:   Allergies  Allergen Reactions  . Cefuroxime Itching  . Shrimp [Shellfish Allergy] Other (See Comments)    Per mar. Unknown reaction   . Sulfa Antibiotics Itching and Rash    VITALS:  Blood pressure (!) 162/78, pulse 72, temperature 99.4 F (37.4 C), temperature source Oral, resp. rate 12, height 5\' 11"  (1.803 m), weight 81.3 kg, SpO2 100 %.  PHYSICAL EXAMINATION:  GENERAL:  67 y.o.-year-old patient lying in the bed with no acute distress.  EYES: Pupils equal, round, reactive to light and accommodation. No scleral icterus. Extraocular muscles intact.  HEENT: Head atraumatic, normocephalic. Oropharynx and nasopharynx clear.  NECK:  Supple, no jugular venous distention. No thyroid enlargement, no tenderness.  LUNGS: Normal breath sounds bilaterally, no wheezing, rales,rhonchi or crepitation. No use of accessory muscles of respiration.  CARDIOVASCULAR: S1, S2 normal. No murmurs, rubs, or gallops.  ABDOMEN: Soft, nontender, nondistended.  Bowel sounds present. No organomegaly or mass.  EXTREMITIES: No pedal edema, cyanosis, or clubbing.  NEUROLOGIC: Cranial nerves II through XII are intact. Muscle strength 5/5 in all extremities. Sensation intact. Gait not checked.  PSYCHIATRIC: The patient is alert and oriented x 3.  SKIN: No obvious rash, lesion, or ulcer.   Physical Exam LABORATORY PANEL:   CBC Recent Labs  Lab 02/20/18 1118  WBC 6.7  HGB 10.4*  HCT 31.9*  PLT 282   ------------------------------------------------------------------------------------------------------------------  Chemistries  Recent Labs  Lab 02/16/18 1026  02/19/18 0223  NA 135   < > 137  K 3.1*   < > 3.3*  CL 97*   < > 97*  CO2 28   < > 29  GLUCOSE 177*   < > 143*  BUN 31*   < > 33*  CREATININE 4.42*   < > 5.02*  CALCIUM 8.6*   < > 8.4*  AST 21  --   --   ALT 11  --   --   ALKPHOS 103  --   --   BILITOT 0.9  --   --    < > = values in this interval not displayed.   ------------------------------------------------------------------------------------------------------------------  Cardiac Enzymes Recent Labs  Lab 02/16/18 1026  TROPONINI 0.09*   ------------------------------------------------------------------------------------------------------------------  RADIOLOGY:  [place] Dialysis Access  Result Date: 02/19/2018 CLINICAL DATA:  67 year old male with end-stage renal disease via a left upper extremity complex arteriovenous fistula which is been previously revised with a hybrid graft in an effort to exclude a large pseudoaneurysm. This operation was performed on Nov 02, 2016 at [place]. Most recently, he underwent fistulagram with angioplasty and peripheral stent placement, as well  as drug eluting balloon angioplasty of a central venous stenosis. He presents for ultrasound evaluation today to assess for patency of his arteriovenous fistula. EXAM: ULTRASOUND DIALYSIS ACCESS TECHNIQUE: Grayscale, color Doppler and spectral Doppler  evaluation of the left upper extremity arteriovenous fistula. COMPARISON:  None. FINDINGS: The brachial basilic anastomosis is identified in the region of the antecubital fossa. The anastomosis is widely patent. There is an aneurysmal segment in the proximal stick zone. Ultrasound evaluation demonstrates stent material in this region. Vascular flow extends through the interstices of the stent into a partially thrombosed pseudoaneurysm. The stents and hybrid graft segment all appear patent. There is an irregular hypoechoic collection in the superficial soft tissues in the mid upper arm which likely represents subcutaneous hematoma, possibly from prior access. IMPRESSION: 1. The left upper extremity brachial basilic arteriovenous fistula and associated hybrid graft remains patent. 2. 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 3. Hematoma is present in the superficial subcutaneous soft tissues overlying the fistula in the mid upper arm, likely secondary to bleeding from a prior access site. 4. Difficult to assess for true stenosis within the hybrid graft, or at the edges of the stents given significant angulation of the fistula itself. Signed, Criselda Peaches, MD [facility] Radiology Electronically Signed   By: Jacqulynn Cadet M.D.   On: 02/19/2018 16:19    ASSESSMENT AND PLAN:  67 year old African-American male with past medical history significant for end-stage renal disease on Tuesday Thursday Saturday hemodialysis, hypertension, peripheral vascular disease, CAD, CHF presents to hospital secondary to generalized body aches and noted to be bacteremic  *Acute recurrent MRSA bacteremia  Stable history of MRSA bacteremia few months ago. - Status post course of IV vancomycin S/p angioplasty and stent placement in the left upper extremity AV graft 3 weeks ago -in discussion with infectious disease-we will  address vascular surgery if removal of stent a possibility in this case Repeat blood cultures August 19 negative thus far, repeat blood cultures sent today during hemodialysis-follow-up on outstanding culture results Echocardiogram negative for vegetations, for TEE later today/cardiology consulted Infectious disease following-for tagged WBC scan  on tomorrow  Continue vancomycin IV February 19, 2018 fistulogram: 1. The left upper extremity brachial basilic arteriovenous fistula and associated hybrid graft remains patent. 2. 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 3. Hematoma is present in the superficial subcutaneous soft tissues overlying the fistula in the mid upper arm, likely secondary to bleeding from a prior access site. 4. Difficult to assess for true stenosis within the hybrid graft, or at the edges of the stents given significant angulation of the fistula itself.  *Chronic end-stage renal disease on hemodialysis Currently receiving hemodialysis Nephrology input appreciated Receives hemodialysis on TTS  *Hypertension Stable on lisinopril and metoprolol  Chronic peripheral vascular disease Continue Plavix  *Hypokalemia Correction with dialysis given ESRD  All the records are reviewed and case discussed with Care Management/Social Workerr. Management plans discussed with the patient, family and they are in agreement.  CODE STATUS: full  TOTAL TIME TAKING CARE OF THIS PATIENT: 40 minutes.     POSSIBLE D/C IN 3-5 DAYS, DEPENDING ON CLINICAL CONDITION.   Avel Peace Salary M.D on 02/20/2018   Between 7am to 6pm - Pager - [phone]  After 6pm go to [email or web address] - password [email or web address] [facility]  Office  [phone]  CC: Primary care physician; Juluis Pitch, MD  Note: This dictation was prepared with Dragon dictation along with smaller phrase technology. Any transcriptional errors that  result from this process are unintentional.

## 2018-02-20 NOTE — Progress Notes (Signed)
[facility], [place] 02/20/18  Subjective:  Patient seen and evaluated during hemodialysis this a.m.  Blood flow rate currently 400.  Repeat blood cultures taken this a.m.     Objective:  Vital signs in last 24 hours:  Temp:  [97.7 F (36.5 C)-98.6 F (37 C)] 97.7 F (36.5 C) (08/20 0735) Pulse Rate:  [69-71] 71 (08/20 0735) Resp:  [18-20] 18 (08/20 0735) BP: (136-164)/(68-74) 164/68 (08/20 0735) SpO2:  [100 %] 100 % (08/20 0735)  Weight change:  Filed Weights   02/16/18 0944  Weight: 85.7 kg    Intake/Output:    Intake/Output Summary (Last 24 hours) at 02/20/2018 1009 Last data filed at 02/19/2018 1503 Gross per 24 hour  Intake 138.33 ml  Output -  Net 138.33 ml     Physical Exam: General:  Chronically ill-appearing, no acute distress  HEENT  moist oral mucous membranes  Neck  supple  Pulm/lungs  normal breathing effort, clear to auscultation  CVS/Heart  no rub or gallop  Abdomen:   Soft, nontender  Extremities:  No peripheral edema, right BKA  Neurologic:  Alert, oriented, able to follow commands  Skin:  Tight skin over legs, no acute rashes  Access:  Left arm AVG       Basic Metabolic Panel:  Recent Labs  Lab 02/16/18 1026 02/17/18 0548 02/18/18 0321 02/19/18 0223  NA 135 137 138 137  K 3.1* 3.0* 3.2* 3.3*  CL 97* 99 99 97*  CO2 28 29 30 29   GLUCOSE 177* 139* 146* 143*  BUN 31* 41* 23 33*  CREATININE 4.42* 5.38* 3.85* 5.02*  CALCIUM 8.6* 8.3* 8.5* 8.4*  PHOS  --  3.2  --   --      CBC: Recent Labs  Lab 02/16/18 1026 02/17/18 0548 02/18/18 0321  WBC 9.7 6.2 7.1  NEUTROABS 6.7*  --   --   HGB 12.2* 11.4* 11.0*  HCT 37.6* 35.0* 33.8*  MCV 85.1 84.8 85.5  PLT 251 268 262      Lab Results  Component Value Date   HEPBSAG Negative 12/26/2017   HEPBSAB Non Reactive 12/26/2017      Microbiology:  Recent Results (from the past 240 hour(s))  Blood Culture (routine x 2)     Status: None (Preliminary  result)   Collection Time: 02/16/18 10:35 AM  Result Value Ref Range Status   Specimen Description BLOOD RT [facility]  Final   Special Requests   Final    BOTTLES DRAWN AEROBIC AND ANAEROBIC Blood Culture adequate volume   Culture   Final    NO GROWTH 4 DAYS Performed at [facility], [address] [place], [place] [address]    Report Status PENDING  Incomplete  Blood Culture (routine x 2)     Status: Abnormal   Collection Time: 02/16/18 11:46 AM  Result Value Ref Range Status   Specimen Description BLOOD RIGHT HAND  Final   Special Requests   Final    BOTTLES DRAWN AEROBIC AND ANAEROBIC Blood Culture adequate volume Performed at [facility], [address] [place], [place] [address]    Culture  Setup Time   Final    GRAM POSITIVE COCCI ANAEROBIC BOTTLE ONLY CRITICAL RESULT CALLED TO, READ BACK BY AND VERIFIED WITH: So [facility] ZOMPA AT 3664 02/17/18 SDR Performed at [facility], [address], [place], [place] [address]    Culture METHICILLIN RESISTANT STAPHYLOCOCCUS AUREUS (A)  Final   Report Status 02/19/2018 FINAL  Final   Organism ID, Bacteria METHICILLIN RESISTANT STAPHYLOCOCCUS AUREUS  Final      Susceptibility   Methicillin resistant staphylococcus aureus - MIC*    CIPROFLOXACIN >=8 RESISTANT Resistant     ERYTHROMYCIN >=8 RESISTANT Resistant     GENTAMICIN <=0.5 SENSITIVE Sensitive     OXACILLIN >=4 RESISTANT Resistant     TETRACYCLINE <=1 SENSITIVE Sensitive     VANCOMYCIN <=0.5 SENSITIVE Sensitive     TRIMETH/SULFA <=10 SENSITIVE Sensitive     CLINDAMYCIN <=0.25 SENSITIVE Sensitive     RIFAMPIN <=0.5 SENSITIVE Sensitive     Inducible Clindamycin NEGATIVE Sensitive     * METHICILLIN RESISTANT STAPHYLOCOCCUS AUREUS  Blood Culture ID Panel (Reflexed)     Status: Abnormal   Collection Time: 02/16/18 11:46 AM  Result Value Ref Range Status   Enterococcus species NOT DETECTED NOT DETECTED Final   Listeria monocytogenes NOT DETECTED NOT DETECTED  Final   Staphylococcus species DETECTED (A) NOT DETECTED Final    Comment: CRITICAL RESULT CALLED TO, READ BACK BY AND VERIFIED WITH:  HANK ZOMPA AT 0818 02/17/18 SDR    Staphylococcus aureus DETECTED (A) NOT DETECTED Final    Comment: Methicillin (oxacillin)-resistant Staphylococcus aureus (MRSA). MRSA is predictably resistant to beta-lactam antibiotics (except ceftaroline). Preferred therapy is vancomycin unless clinically contraindicated. Patient requires contact precautions if  hospitalized. CRITICAL RESULT CALLED TO, READ BACK BY AND VERIFIED WITH:  HANK ZOMPA AT 0818 02/17/18 SDR    Methicillin resistance DETECTED (A) NOT DETECTED Final    Comment: CRITICAL RESULT CALLED TO, READ BACK BY AND VERIFIED WITH: HANK ZOMPA AT 0818 02/17/18 SDR    Streptococcus species NOT DETECTED NOT DETECTED Final   Streptococcus agalactiae NOT DETECTED NOT DETECTED Final   Streptococcus pneumoniae NOT DETECTED NOT DETECTED Final   Streptococcus pyogenes NOT DETECTED NOT DETECTED Final   Acinetobacter baumannii NOT DETECTED NOT DETECTED Final   Enterobacteriaceae species NOT DETECTED NOT DETECTED Final   Enterobacter cloacae complex NOT DETECTED NOT DETECTED Final   Escherichia coli NOT DETECTED NOT DETECTED Final   Klebsiella oxytoca NOT DETECTED NOT DETECTED Final   Klebsiella pneumoniae NOT DETECTED NOT DETECTED Final   Proteus species NOT DETECTED NOT DETECTED Final   Serratia marcescens NOT DETECTED NOT DETECTED Final   Haemophilus influenzae NOT DETECTED NOT DETECTED Final   Neisseria meningitidis NOT DETECTED NOT DETECTED Final   Pseudomonas aeruginosa NOT DETECTED NOT DETECTED Final   Candida albicans NOT DETECTED NOT DETECTED Final   Candida glabrata NOT DETECTED NOT DETECTED Final   Candida krusei NOT DETECTED NOT DETECTED Final   Candida parapsilosis NOT DETECTED NOT DETECTED Final   Candida tropicalis NOT DETECTED NOT DETECTED Final    Comment: Performed at [facility], [address] [place], [place] [address]  MRSA PCR Screening     Status: Abnormal   Collection Time: 02/16/18  6:05 PM  Result Value Ref Range Status   MRSA by PCR POSITIVE (A) NEGATIVE Final    Comment:        The GeneXpert MRSA Assay (FDA approved for NASAL specimens only), is one component of a comprehensive MRSA colonization surveillance program. It is not intended to diagnose MRSA infection nor to guide or monitor treatment for MRSA infections. RESULT CALLED TO, READ BACK BY AND VERIFIED WITH: Noah Delaine AT 6599 02/16/18.PMH Performed at [facility], [place], [place] [address]   CULTURE, BLOOD (ROUTINE X 2) w Reflex to ID Panel     Status:  None (Preliminary result)   Collection Time: 02/19/18 12:48 AM  Result Value Ref Range Status   Specimen Description BLOOD RIGHT WRIST  Final   Special Requests   Final    BOTTLES DRAWN AEROBIC AND ANAEROBIC Blood Culture adequate volume   Culture   Final    NO GROWTH 1 DAY Performed at [facility], [address], [place], [place] [address]    Report Status PENDING  Incomplete  CULTURE, BLOOD (ROUTINE X 2) w Reflex to ID Panel     Status: None (Preliminary result)   Collection Time: 02/19/18 12:48 AM  Result Value Ref Range Status   Specimen Description BLOOD RIGHT HAND  Final   Special Requests   Final    BOTTLES DRAWN AEROBIC AND ANAEROBIC Blood Culture adequate volume   Culture   Final    NO GROWTH 1 DAY Performed at [facility], [address] [place], [place] [address]    Report Status PENDING  Incomplete    Coagulation Studies: No results for input(s): LABPROT, INR in the last 72 hours.  Urinalysis: No results for input(s): COLORURINE, LABSPEC, PHURINE, GLUCOSEU, HGBUR, BILIRUBINUR, KETONESUR, PROTEINUR, UROBILINOGEN, NITRITE, LEUKOCYTESUR in the last 72 hours.  Invalid input(s): APPERANCEUR    Imaging: [place] Dialysis Access  Result Date: 02/19/2018 CLINICAL DATA:   67 year old male with end-stage renal disease via a left upper extremity complex arteriovenous fistula which is been previously revised with a hybrid graft in an effort to exclude a large pseudoaneurysm. This operation was performed on Nov 02, 2016 at [place]. Most recently, he underwent fistulagram with angioplasty and peripheral stent placement, as well as drug eluting balloon angioplasty of a central venous stenosis. He presents for ultrasound evaluation today to assess for patency of his arteriovenous fistula. EXAM: ULTRASOUND DIALYSIS ACCESS TECHNIQUE: Grayscale, color Doppler and spectral Doppler evaluation of the left upper extremity arteriovenous fistula. COMPARISON:  None. FINDINGS: The brachial basilic anastomosis is identified in the region of the antecubital fossa. The anastomosis is widely patent. There is an aneurysmal segment in the proximal stick zone. Ultrasound evaluation demonstrates stent material in this region. Vascular flow extends through the interstices of the stent into a partially thrombosed pseudoaneurysm. The stents and hybrid graft segment all appear patent. There is an irregular hypoechoic collection in the superficial soft tissues in the mid upper arm which likely represents subcutaneous hematoma, possibly from prior access. IMPRESSION: 1. The left upper extremity brachial basilic arteriovenous fistula and associated hybrid graft remains patent. 2. 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 3. Hematoma is present in the superficial subcutaneous soft tissues overlying the fistula in the mid upper arm, likely secondary to bleeding from a prior access site. 4. Difficult to assess for true stenosis within the hybrid graft, or at the edges of the stents given significant angulation of the fistula itself. Signed, Criselda Peaches, MD [facility] Radiology Electronically Signed   By: Jacqulynn Cadet M.D.   On: 02/19/2018 16:19     Medications:   . sodium chloride    . vancomycin     . aspirin EC  81 mg Oral q1800  . atorvastatin  40 mg Oral q1800  . B-complex with vitamin C  1 tablet Oral q1800  . Chlorhexidine Gluconate Cloth  6 each Topical Q0600  . cholecalciferol  1,000 Units Oral Daily  . clopidogrel  75 mg Oral q1800  . docusate sodium  200  mg Oral BID  . feeding supplement (NEPRO CARB STEADY)  237 mL Oral TID BM  . ferrous sulfate  325 mg Oral q1800  . gabapentin  200 mg Oral TID  . heparin  5,000 Units Subcutaneous Q8H  . insulin aspart  0-15 Units Subcutaneous TID WC  . insulin aspart  0-5 Units Subcutaneous QHS  . lactulose  20 g Oral QHS  . lidocaine-prilocaine  1 application Topical Q T,Th,Sa-HD  . lisinopril  5 mg Oral q1800  . mouth rinse  15 mL Mouth Rinse BID  . metoprolol succinate  12.5 mg Oral BID  . multivitamin with minerals   Oral QHS  . mupirocin ointment  1 application Nasal BID  . polyethylene glycol  17 g Oral q1800  . polyvinyl alcohol  1 drop Left Eye QID  . senna  8.6 mg Oral BID  . sertraline  100 mg Oral QHS  . sodium chloride flush  3 mL Intravenous Q12H  . ascorbic acid  500 mg Oral BID   sodium chloride, acetaminophen, benzocaine, hydrocerin, HYDROmorphone (DILAUDID) injection, nitroGLYCERIN, ondansetron **OR** ondansetron (ZOFRAN) IV, oxyCODONE, sodium chloride flush, traMADol  Assessment/ Plan:  67 y.o. African American male with end stage renal disease onHemodialysis, diabetes mellitus type II, hypertension, peripheral vascular disease, coronary artery disease, hyperlipidemia, congestive heart failure, left BKA, hospitalization on6/23/2019forAMS with respiratory failure, left AV graft angioplasty and stent placement February 07, 2018, hospitalization, gram-negative bacteremia 02/16/2018  TTS [facility] left AVG EDW 80 kg Resident of [facility]   1.  ESRD  2.  Anemia of CKD  3.  SHPTH  4.   MRSA bacteremia.  Source unclear ?  AV graft versus other   He recently completed vancomycin for MRSA bacteremia.  Outpatient follow-up cultures initially showed gram-negative bacteremia BUT final report is negative. He was started on IV Zosyn.  Repeat inpatient cultures are positive for MRSA bacteremia.  Plan: Patient seen and evaluated during hemodialysis today.  Tolerating well thus far.  Repeat blood cultures taken today.  Hemoglobin currently 11.0.  Hold off on Epogen for now.  Recheck serum phosphorus today as well.  Otherwise plan as per hospitalist.  Plans for TEE noted.     LOS: 3 Quanita Barona 8/20/201910:09 AM  [place], [address]  Note: This note was prepared with Dragon dictation. Any transcription errors are unintentional

## 2018-02-20 NOTE — Progress Notes (Signed)
HD Initiated    02/20/18 0950  Vital Signs  Pulse Rate Source Monitor  BP Location Right Arm  BP Method Automatic  Patient Position (if appropriate) Lying  During Hemodialysis Assessment  Blood Flow Rate (mL/min) 400 mL/min  Arterial Pressure (mmHg) -170 mmHg  Venous Pressure (mmHg) 170 mmHg  Transmembrane Pressure (mmHg) 70 mmHg  Ultrafiltration Rate (mL/min) 720 mL/min  Dialysate Flow Rate (mL/min) 800 ml/min  Conductivity: Machine  13.9  HD Safety Checks Performed Yes  Dialysis Fluid Bolus Normal Saline  Bolus Amount (mL) 250 mL  Intra-Hemodialysis Comments Tx initiated  Fistula / Graft Left Upper arm Arteriovenous fistula  No Placement Date or Time found.   Orientation: Left  Access Location: Upper arm  Access Type: Arteriovenous fistula  Status Accessed  Needle Size 15

## 2018-02-20 NOTE — Progress Notes (Signed)
Paged prime doc patient uncontrolled pain

## 2018-02-20 NOTE — Progress Notes (Signed)
Post HD Assessment    02/20/18 1335  Neurological  Level of Consciousness Alert  Orientation Level Oriented X4  Respiratory  Respiratory Pattern Regular;Unlabored;Symmetrical  Chest Assessment Chest expansion symmetrical  Bilateral Breath Sounds Diminished  Cardiac  Pulse Regular  Jugular Venous Distention (JVD) No  ECG Monitor Yes  Cardiac Rhythm NSR  Antiarrhythmic device No  Vascular  R Radial Pulse +1  L Radial Pulse +1  Integumentary  Integumentary (WDL) X  Skin Color Appropriate for ethnicity  Skin Condition Dry;Flaky  Musculoskeletal  Musculoskeletal (WDL) X  Generalized Weakness Yes  Gastrointestinal  Bowel Sounds Assessment Hypoactive  GU Assessment  Genitourinary (WDL) X (HD patient)  Psychosocial  Psychosocial (WDL) WDL

## 2018-02-20 NOTE — Interval H&P Note (Signed)
History and Physical Interval Note:  02/20/2018 3:00 PM  Dennis Walker  has presented today for surgery, with the diagnosis of Bactremia  The various methods of treatment have been discussed with the patient and family. After consideration of risks, benefits and other options for treatment, the patient has consented to  Procedure(s): TRANSESOPHAGEAL ECHOCARDIOGRAM (TEE) (N/A) as a surgical intervention .  The patient's history has been reviewed, patient examined, no change in status, stable for surgery.  I have reviewed the patient's chart and labs.  Questions were answered to the patient's satisfaction.     Shaquetta Arcos

## 2018-02-20 NOTE — Progress Notes (Signed)
Pharmacy Antibiotic Note  Dennis Walker is a 67 y.o. male admitted on 02/16/2018 with MRSA bacteremia.  Pharmacy has been consulted for vancomycin dosing.  Plan: Vancomycin 1 g IV to be given toward the end of each dialysis session. Trough to be drawn prior to every 3rd dialysis session. Goal trough 15-25 mcg/ml. First trough ordered for 8/22 @ 0500  Height: 5\' 11"  (180.3 cm) Weight: 189 lb (85.7 kg) IBW/kg (Calculated) : 75.3  Temp (24hrs), Avg:98.3 F (36.8 C), Min:97.7 F (36.5 C), Max:98.6 F (37 C)  Recent Labs  Lab 02/16/18 1025 02/16/18 1026 02/16/18 1539 02/17/18 0548 02/18/18 0321 02/19/18 0223  WBC  --  9.7  --  6.2 7.1  --   CREATININE  --  4.42*  --  5.38* 3.85* 5.02*  LATICACIDVEN 1.2  --  0.8  --   --   --     Estimated Creatinine Clearance: 15.4 mL/min (A) (by C-G formula based on SCr of 5.02 mg/dL (H)).    Allergies  Allergen Reactions  . Cefuroxime Itching  . Shrimp [Shellfish Allergy] Other (See Comments)    Per mar. Unknown reaction   . Sulfa Antibiotics Itching and Rash    Antimicrobials this admission: Zosyn 8/16 >> 8/18 Vanc 8/17 >>   Dose adjustments this admission:   Microbiology results: 8/16 BCx: 1/2 MRSA 8/19 BCx: NGTD 8/20 BCX: to be drawn today from HD fistula 8/16 MRSA PCR: positive  Thank you for allowing pharmacy to be a part of this patient's care.  Tawnya Crook, PharmD Pharmacy Resident  02/20/2018 8:30 AM

## 2018-02-20 NOTE — Progress Notes (Signed)
Pre HD Treatment    02/20/18 0922  Vital Signs  Temp 99.4 F (37.4 C)  Temp Source Oral  Pulse Rate 70  Pulse Rate Source Monitor  Resp 18  BP (!) 171/77  BP Location Right Arm  BP Method Automatic  Patient Position (if appropriate) Lying  Oxygen Therapy  SpO2 100 %  O2 Device Room Air  Pain Assessment  Pain Scale 0-10  Pain Score 5  Pain Type Acute pain  Pain Location Shoulder  Pain Orientation Left  Pain Descriptors / Indicators Aching  Pain Intervention(s) Repositioned;Distraction;Emotional support  Dialysis Weight  Weight 81.3 kg  Type of Weight Pre-Dialysis  Time-Out for Hemodialysis  What Procedure? HD  Pt Identifiers(min of two) First/Last Name;MRN/Account#  Correct Site? Yes  Correct Side? Yes  Correct Procedure? Yes  Consents Verified? Yes  Rad Studies Available? N/A  Safety Precautions Reviewed? Yes  Machine [employer] Number [phone]  Station Number 4  UF/Alarm Test Passed  Conductivity: Meter 13.9  Conductivity: Machine  14  pH 7.4  Reverse Osmosis Main  Normal Saline Lot Number [id number]  Dialyzer Lot Number 19  Disposable Set Lot Number [id number]  Machine Temperature 98.6 F (37 C)  Musician and Audible Yes  Blood Lines Intact and Secured Yes  Pre Treatment Patient Checks  Vascular access used during treatment Fistula  Hepatitis B Surface Antigen Results Negative  Date Hepatitis B Surface Antigen Drawn 11/29/16  Hepatitis B Surface Antibody 17  Date Hepatitis B Surface Antibody Drawn 01/23/18  Hemodialysis Consent Verified Yes  Hemodialysis Standing Orders Initiated Yes  ECG (Telemetry) Monitor On Yes  Prime Ordered Normal Saline  Length of  DialysisTreatment -hour(s) 3.5 Hour(s)  Dialyzer Elisio 17H NR  Dialysate 2K, 2.5 Ca  Dialysis Anticoagulant None  Dialysate Flow Ordered 800  Blood Flow Rate Ordered 400 mL/min  Ultrafiltration Goal 2 Liters  Pre Treatment Labs Phosphorus;CBC  Dialysis Blood Pressure Support Ordered  Normal Saline  Education / Care Plan  Dialysis Education Provided Yes  Documented Education in Care Plan Yes  Fistula / Graft Left Upper arm Arteriovenous fistula  No Placement Date or Time found.   Orientation: Left  Access Location: Upper arm  Access Type: Arteriovenous fistula  Site Condition No complications  Fistula / Graft Assessment Present;Thrill;Bruit  Drainage Description None

## 2018-02-20 NOTE — CV Procedure (Signed)
    Transesophageal Echocardiogram Note  Dennis Walker [id number] Oct 22, 1950  Procedure: Transesophageal Echocardiogram Indications: Bacteremia  Procedure Details Consent: Obtained Time Out: Verified patient identification, verified procedure, site/side was marked, verified correct patient position, special equipment/implants available, Radiology Safety Procedures followed,  medications/allergies/relevent history reviewed, required imaging and test results available.  Performed  Medications:  During this procedure the patient is administered a total of Versed 2 mg and Fentanyl 25 mcg  to achieve and maintain moderate conscious sedation.  The patient's heart rate, blood pressure, and oxygen saturation are monitored continuously during the procedure. The period of conscious sedation is 16 minutes, of which I was present face-to-face 100% of this time.  Left Ventrical:  Normal LVEF  Mitral Valve: Moderately thickened with mild MR.  No vegetation.  Aortic Valve: Sclerotic without vegetation.  Tricuspid Valve: Normal appearance with trace TR.  Pulmonic Valve: Suboptimally imaged.  No significant PR.  Left Atrium/ Left atrial appendage: No thrombus.  Atrial septum: Intact by 2D echo and color Doppler.  Aorta: Mild atherosclerosis.  Complications: No apparent complications Patient did tolerate procedure well.  Nelva Bush, MD 02/20/2018, 3:02 PM

## 2018-02-21 ENCOUNTER — Inpatient Hospital Stay: Payer: Medicare Other

## 2018-02-21 LAB — GLUCOSE, CAPILLARY
Glucose-Capillary: 124 mg/dL — ABNORMAL HIGH (ref 70–99)
Glucose-Capillary: 151 mg/dL — ABNORMAL HIGH (ref 70–99)
Glucose-Capillary: 159 mg/dL — ABNORMAL HIGH (ref 70–99)
Glucose-Capillary: 119 mg/dL — ABNORMAL HIGH (ref 70–99)
Glucose-Capillary: 124 mg/dL — ABNORMAL HIGH (ref 70–99)

## 2018-02-21 LAB — CULTURE, BLOOD (ROUTINE X 2)
CULTURE: NO GROWTH
SPECIAL REQUESTS: ADEQUATE

## 2018-02-21 MED ORDER — ACETAMINOPHEN 500 MG PO TABS: 500.0000 mg | ORAL_TABLET | ORAL | Status: DC | PRN

## 2018-02-21 MED ORDER — FLUDEOXYGLUCOSE F - 18 (FDG) INJECTION
8.7500 | Freq: Once | INTRAVENOUS | Status: AC | PRN
  Administered 2018-02-21: 8.75 via INTRAVENOUS

## 2018-02-21 NOTE — Progress Notes (Signed)
[facility] NAME: Dennis Walker    MR#:  [id number]  DATE OF BIRTH:  January 31, 1951  SUBJECTIVE:  CHIEF COMPLAINT:   Chief Complaint  Patient presents with  . Weakness   The patient complains of neck pain. REVIEW OF SYSTEMS:  CONSTITUTIONAL: No fever, fatigue or weakness.  EYES: No blurred or double vision.  EARS, NOSE, AND THROAT: No tinnitus or ear pain.  RESPIRATORY: No cough, shortness of breath, wheezing or hemoptysis.  CARDIOVASCULAR: No chest pain, orthopnea, edema.  GASTROINTESTINAL: No nausea, vomiting, diarrhea or abdominal pain.  GENITOURINARY: No dysuria, hematuria.  ENDOCRINE: No polyuria, nocturia,  HEMATOLOGY: No anemia, easy bruising or bleeding SKIN: No rash or lesion. MUSCULOSKELETAL: No joint pain or arthritis.  Neck pain. NEUROLOGIC: No tingling, numbness, weakness.  PSYCHIATRY: No anxiety or depression.   ROS  DRUG ALLERGIES:   Allergies  Allergen Reactions  . Cefuroxime Itching  . Shrimp [Shellfish Allergy] Other (See Comments)    Per mar. Unknown reaction   . Sulfa Antibiotics Itching and Rash    VITALS:  Blood pressure (!) 155/85, pulse 82, temperature 98.7 F (37.1 C), resp. rate 20, height 5\' 11"  (1.803 m), weight 79.7 kg, SpO2 99 %.  PHYSICAL EXAMINATION:  GENERAL:  67 y.o.-year-old patient lying in the bed with no acute distress.  EYES: Pupils equal, round, reactive to light and accommodation. No scleral icterus. Extraocular muscles intact.  HEENT: Head atraumatic, normocephalic. Oropharynx and nasopharynx clear.  NECK:  Supple, no jugular venous distention. No thyroid enlargement, no tenderness.  LUNGS: Normal breath sounds bilaterally, no wheezing, rales,rhonchi or crepitation. No use of accessory muscles of respiration.  CARDIOVASCULAR: S1, S2 normal. No murmurs, rubs, or gallops.  ABDOMEN: Soft, nontender, nondistended. Bowel sounds present. No organomegaly or mass.  EXTREMITIES: No pedal  edema, cyanosis, or clubbing.  Left AKA.  Right hand contracted. NEUROLOGIC: Cranial nerves II through XII are intact. Muscle strength 4/5 in all extremities. Sensation intact. Gait not checked.  PSYCHIATRIC: The patient is alert and oriented x 3.  SKIN: No obvious rash, lesion, or ulcer.   Physical Exam LABORATORY PANEL:   CBC Recent Labs  Lab 02/20/18 1118  WBC 6.7  HGB 10.4*  HCT 31.9*  PLT 282   ------------------------------------------------------------------------------------------------------------------  Chemistries  Recent Labs  Lab 02/16/18 1026  02/19/18 0223  NA 135   < > 137  K 3.1*   < > 3.3*  CL 97*   < > 97*  CO2 28   < > 29  GLUCOSE 177*   < > 143*  BUN 31*   < > 33*  CREATININE 4.42*   < > 5.02*  CALCIUM 8.6*   < > 8.4*  AST 21  --   --   ALT 11  --   --   ALKPHOS 103  --   --   BILITOT 0.9  --   --    < > = values in this interval not displayed.   ------------------------------------------------------------------------------------------------------------------  Cardiac Enzymes Recent Labs  Lab 02/16/18 1026  TROPONINI 0.09*   ------------------------------------------------------------------------------------------------------------------  RADIOLOGY:  Nm Pet Tum Img Whole Body  Result Date: 02/21/2018 CLINICAL DATA:  Initial treatment strategy for fever of unknown origin, bacteremia, leukocytosis, inpatient. EXAM: NUCLEAR MEDICINE PET WHOLE BODY TECHNIQUE: 8.8 mCi F-18 FDG was injected intravenously. Full-ring PET imaging was performed from the skull base to thigh after the radiotracer. CT data was obtained and used for attenuation correction and anatomic  localization. Fasting blood glucose: 124 mg/dl COMPARISON:  12/24/2017 CT abdomen/pelvis. 02/16/2018 chest radiograph. FINDINGS: Mediastinal blood pool activity: SUV max 3.4 HEAD/NECK: No hypermetabolic activity in the scalp. No hypermetabolic cervical lymph nodes. Incidental CT findings:  Shrunken coarsely calcified right globe. CHEST: No hypermetabolic axillary, mediastinal or hilar nodes. No hypermetabolic pulmonary findings. Incidental CT findings: Mild cardiomegaly. Three-vessel coronary atherosclerosis. Atherosclerotic nonaneurysmal thoracic aorta. Left subclavian venous stent is in place. Trace dependent right pleural effusion. No acute consolidative airspace disease or lung masses. A few scattered tiny pulmonary nodules in the bilateral upper lobes, largest 4 mm in the right upper lobe (series 3/image 119), below PET resolution. ABDOMEN/PELVIS: No abnormal hypermetabolic activity within the liver, pancreas, adrenal glands, or spleen. No hypermetabolic lymph nodes in the abdomen or pelvis. Incidental CT findings: Diffuse bladder wall thickening. SKELETON: There is hypermetabolism at the C6-7 disc with max SUV 5.5, with associated erosive changes at the C6-7 disc level on the CT images. Incidental CT findings: none EXTREMITIES: Linear mild hypermetabolism in the medial right thigh musculature without abnormal findings on the CT, probably activity related. Otherwise no abnormal hypermetabolic activity in the lower extremities. 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 IMPRESSION: 1. Hypermetabolism and erosive changes at the C6-7 disc, highly suspicious for discitis osteomyelitis, probably the source of the patient's bacteremia and fever. 2. Linear hypermetabolism in the medial right thigh musculature without abnormal findings in the CT, probably activity related. 3. Scattered tiny pulmonary nodules in the upper lobes, below PET resolution, largest 4 mm. No follow-up needed if patient is low-risk (and has no known or suspected primary neoplasm). Non-contrast chest CT can be considered in 12 months if patient is high-risk. This recommendation follows the consensus statement: Guidelines for Management of Incidental Pulmonary Nodules  Detected on CT Images:From the Fleischner Society 2017; published online before print (10.1148/[id number]). 4.  Aortic Atherosclerosis (ICD10-I70.0). 5. Mild cardiomegaly. Three-vessel coronary atherosclerosis. Trace dependent right pleural effusion. 6. Nonspecific diffuse bladder wall thickening, for which urinalysis correlation could be obtained as clinically warranted. Electronically Signed   By: Dennis Walker M.D.   On: 02/21/2018 10:37    ASSESSMENT AND PLAN:  67 year old African-American male with past medical history significant for end-stage renal disease on Tuesday Thursday Saturday hemodialysis, hypertension, peripheral vascular disease, CAD, CHF presents to hospital secondary to generalized body aches and noted to be bacteremic  *Acute recurrent MRSA bacteremia  Stable history of MRSA bacteremia few months ago. - Status post course of IV vancomycin S/p angioplasty and stent placement in the left upper extremity AV graft 3 weeks ago. F/U vascular surgery for removal of stent a possibility in this case. Repeat blood cultures August 19 negative thus far, repeat blood cultures are negative so far. 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 Continue vancomycin IV.  The patient may need long-term IV vancomycin during hemodialysis.  Follow-up ID's recommendation.  February 19, 2018 fistulogram: 1. The left upper extremity brachial basilic arteriovenous fistula and associated hybrid graft remains patent. 2. 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 3. Hematoma is present in the superficial subcutaneous soft tissues overlying the fistula in the mid upper arm, likely secondary to bleeding from a prior access site. 4. Difficult to assess for true stenosis within the hybrid graft, or at the edges of the stents given significant angulation of the fistula  itself.  *Chronic  end-stage renal disease on hemodialysis Currently receiving hemodialysis Nephrology input appreciated Receives hemodialysis on TTS  *Hypertension Stable on lisinopril and metoprolol  Chronic peripheral vascular disease Continue Plavix  *Hypokalemia Correction with dialysis given ESRD  Discussed with Dr. Holley Raring. All the records are reviewed and case discussed with Care Management/Social Workerr. Management plans discussed with the patient, family and they are in agreement.  CODE STATUS: full  TOTAL TIME TAKING CARE OF THIS PATIENT: 36 minutes.     POSSIBLE D/C IN 3 DAYS, DEPENDING ON CLINICAL CONDITION.   Demetrios Loll M.D on 02/21/2018   Between 7am to 6pm - Pager - [phone]  After 6pm go to [email or web address] - password [email or web address] [facility]  Office  [phone]  CC: Primary care physician; Juluis Pitch, MD  Note: This dictation was prepared with Dragon dictation along with smaller phrase technology. Any transcriptional errors that result from this process are unintentional.

## 2018-02-21 NOTE — Progress Notes (Signed)
[facility], [place] 02/21/18  Subjective:  Patient resting comfortably in bed. TEE was negative. Due for dialysis again tomorrow.  Objective:  Vital signs in last 24 hours:  Temp:  [98.2 F (36.8 C)-98.7 F (37.1 C)] 98.7 F (37.1 C) (08/21 0036) Pulse Rate:  [69-84] 82 (08/21 0202) Resp:  [12-29] 20 (08/21 0036) BP: (135-179)/(66-87) 155/85 (08/21 0202) SpO2:  [94 %-100 %] 99 % (08/21 0036) Weight:  [79.7 kg] 79.7 kg (08/20 1401)  Weight change:  Filed Weights   02/20/18 0922 02/20/18 1330 02/20/18 1401  Weight: 81.3 kg 79.7 kg 79.7 kg    Intake/Output:    Intake/Output Summary (Last 24 hours) at 02/21/2018 1124 Last data filed at 02/21/2018 0615 Gross per 24 hour  Intake -  Output 1633 ml  Net -1633 ml     Physical Exam: General:  Chronically ill-appearing, no acute distress  HEENT  moist oral mucous membranes  Neck  supple  Pulm/lungs  normal breathing effort, clear to auscultation  CVS/Heart  no rub or gallop  Abdomen:   Soft, nontender  Extremities:  No peripheral edema, right BKA  Neurologic:  Alert, oriented, able to follow commands  Skin:  Tight skin over legs, no acute rashes  Access:  Left arm AVG       Basic Metabolic Panel:  Recent Labs  Lab 02/16/18 1026 02/17/18 0548 02/18/18 0321 02/19/18 0223 02/20/18 0850  NA 135 137 138 137  --   K 3.1* 3.0* 3.2* 3.3*  --   CL 97* 99 99 97*  --   CO2 28 29 30 29   --   GLUCOSE 177* 139* 146* 143*  --   BUN 31* 41* 23 33*  --   CREATININE 4.42* 5.38* 3.85* 5.02*  --   CALCIUM 8.6* 8.3* 8.5* 8.4*  --   PHOS  --  3.2  --   --  4.2     CBC: Recent Labs  Lab 02/16/18 1026 02/17/18 0548 02/18/18 0321 02/20/18 1118  WBC 9.7 6.2 7.1 6.7  NEUTROABS 6.7*  --   --   --   HGB 12.2* 11.4* 11.0* 10.4*  HCT 37.6* 35.0* 33.8* 31.9*  MCV 85.1 84.8 85.5 84.4  PLT 251 268 262 282      Lab Results  Component Value Date   HEPBSAG Negative 12/26/2017   HEPBSAB Non  Reactive 12/26/2017      Microbiology:  Recent Results (from the past 240 hour(s))  Blood Culture (routine x 2)     Status: None   Collection Time: 02/16/18 10:35 AM  Result Value Ref Range Status   Specimen Description BLOOD RT [facility]  Final   Special Requests   Final    BOTTLES DRAWN AEROBIC AND ANAEROBIC Blood Culture adequate volume   Culture   Final    NO GROWTH 5 DAYS Performed at [facility], [address] [place], [place] [address]    Report Status 02/21/2018 FINAL  Final  Blood Culture (routine x 2)     Status: Abnormal   Collection Time: 02/16/18 11:46 AM  Result Value Ref Range Status   Specimen Description BLOOD RIGHT HAND  Final   Special Requests   Final    BOTTLES DRAWN AEROBIC AND ANAEROBIC Blood Culture adequate volume Performed at [facility], [address] [place], [place] [address]    Culture  Setup Time   Final    GRAM POSITIVE COCCI ANAEROBIC BOTTLE ONLY CRITICAL RESULT CALLED TO,  READ BACK BY AND VERIFIED WITHJeralene Peters ZOMPA AT 9323 02/17/18 SDR Performed at [facility], [place] [address] [place], [place] [address]    Culture METHICILLIN RESISTANT STAPHYLOCOCCUS AUREUS (A)  Final   Report Status 02/19/2018 FINAL  Final   Organism ID, Bacteria METHICILLIN RESISTANT STAPHYLOCOCCUS AUREUS  Final      Susceptibility   Methicillin resistant staphylococcus aureus - MIC*    CIPROFLOXACIN >=8 RESISTANT Resistant     ERYTHROMYCIN >=8 RESISTANT Resistant     GENTAMICIN <=0.5 SENSITIVE Sensitive     OXACILLIN >=4 RESISTANT Resistant     TETRACYCLINE <=1 SENSITIVE Sensitive     VANCOMYCIN <=0.5 SENSITIVE Sensitive     TRIMETH/SULFA <=10 SENSITIVE Sensitive     CLINDAMYCIN <=0.25 SENSITIVE Sensitive     RIFAMPIN <=0.5 SENSITIVE Sensitive     Inducible Clindamycin NEGATIVE Sensitive     * METHICILLIN RESISTANT STAPHYLOCOCCUS AUREUS  Blood Culture ID Panel (Reflexed)     Status: Abnormal   Collection Time: 02/16/18 11:46 AM  Result  Value Ref Range Status   Enterococcus species NOT DETECTED NOT DETECTED Final   Listeria monocytogenes NOT DETECTED NOT DETECTED Final   Staphylococcus species DETECTED (A) NOT DETECTED Final    Comment: CRITICAL RESULT CALLED TO, READ BACK BY AND VERIFIED WITH:  HANK ZOMPA AT 0818 02/17/18 SDR    Staphylococcus aureus DETECTED (A) NOT DETECTED Final    Comment: Methicillin (oxacillin)-resistant Staphylococcus aureus (MRSA). MRSA is predictably resistant to beta-lactam antibiotics (except ceftaroline). Preferred therapy is vancomycin unless clinically contraindicated. Patient requires contact precautions if  hospitalized. CRITICAL RESULT CALLED TO, READ BACK BY AND VERIFIED WITH:  HANK ZOMPA AT 0818 02/17/18 SDR    Methicillin resistance DETECTED (A) NOT DETECTED Final    Comment: CRITICAL RESULT CALLED TO, READ BACK BY AND VERIFIED WITH: HANK ZOMPA AT 0818 02/17/18 SDR    Streptococcus species NOT DETECTED NOT DETECTED Final   Streptococcus agalactiae NOT DETECTED NOT DETECTED Final   Streptococcus pneumoniae NOT DETECTED NOT DETECTED Final   Streptococcus pyogenes NOT DETECTED NOT DETECTED Final   Acinetobacter baumannii NOT DETECTED NOT DETECTED Final   Enterobacteriaceae species NOT DETECTED NOT DETECTED Final   Enterobacter cloacae complex NOT DETECTED NOT DETECTED Final   Escherichia coli NOT DETECTED NOT DETECTED Final   Klebsiella oxytoca NOT DETECTED NOT DETECTED Final   Klebsiella pneumoniae NOT DETECTED NOT DETECTED Final   Proteus species NOT DETECTED NOT DETECTED Final   Serratia marcescens NOT DETECTED NOT DETECTED Final   Haemophilus influenzae NOT DETECTED NOT DETECTED Final   Neisseria meningitidis NOT DETECTED NOT DETECTED Final   Pseudomonas aeruginosa NOT DETECTED NOT DETECTED Final   Candida albicans NOT DETECTED NOT DETECTED Final   Candida glabrata NOT DETECTED NOT DETECTED Final   Candida krusei NOT DETECTED NOT DETECTED Final   Candida parapsilosis NOT  DETECTED NOT DETECTED Final   Candida tropicalis NOT DETECTED NOT DETECTED Final    Comment: Performed at [facility], [address] [place], [place] [address]  MRSA PCR Screening     Status: Abnormal   Collection Time: 02/16/18  6:05 PM  Result Value Ref Range Status   MRSA by PCR POSITIVE (A) NEGATIVE Final    Comment:        The GeneXpert MRSA Assay (FDA approved for NASAL specimens only), is one component of a comprehensive MRSA colonization surveillance program. It is not intended to diagnose MRSA infection nor to guide or monitor treatment for MRSA infections.  RESULT CALLED TO, READ BACK BY AND VERIFIED WITH: Noah Delaine AT 7824 02/16/18.PMH Performed at [facility], [address] [place], [place] [address]   CULTURE, BLOOD (ROUTINE X 2) w Reflex to ID Panel     Status: None (Preliminary result)   Collection Time: 02/19/18 12:48 AM  Result Value Ref Range Status   Specimen Description BLOOD RIGHT WRIST  Final   Special Requests   Final    BOTTLES DRAWN AEROBIC AND ANAEROBIC Blood Culture adequate volume   Culture   Final    NO GROWTH 2 DAYS Performed at [facility], [address] [place], [place] [address]    Report Status PENDING  Incomplete  CULTURE, BLOOD (ROUTINE X 2) w Reflex to ID Panel     Status: None (Preliminary result)   Collection Time: 02/19/18 12:48 AM  Result Value Ref Range Status   Specimen Description BLOOD RIGHT HAND  Final   Special Requests   Final    BOTTLES DRAWN AEROBIC AND ANAEROBIC Blood Culture adequate volume   Culture   Final    NO GROWTH 2 DAYS Performed at [facility], [address], [place], [place] [address]    Report Status PENDING  Incomplete  CULTURE, BLOOD (ROUTINE X 2) w Reflex to ID Panel     Status: None (Preliminary result)   Collection Time: 02/20/18  8:59 AM  Result Value Ref Range Status   Specimen Description HEMODIALYSIS FISTULA  Final   Special Requests NONE   Final   Culture   Final    NO GROWTH < 24 HOURS Performed at [facility], [address] [place],  [address]    Report Status PENDING  Incomplete  CULTURE, BLOOD (ROUTINE X 2) w Reflex to ID Panel     Status: None (Preliminary result)   Collection Time: 02/20/18 10:47 AM  Result Value Ref Range Status   Specimen Description HEMODIALYSIS FISTULA  Final   Special Requests NONE  Final   Culture   Final    NO GROWTH < 24 HOURS Performed at [facility], [address] [place],  [address]    Report Status PENDING  Incomplete    Coagulation Studies: No results for input(s): LABPROT, INR in the last 72 hours.  Urinalysis: No results for input(s): COLORURINE, LABSPEC, PHURINE, GLUCOSEU, HGBUR, BILIRUBINUR, KETONESUR, PROTEINUR, UROBILINOGEN, NITRITE, LEUKOCYTESUR in the last 72 hours.  Invalid input(s): APPERANCEUR    Imaging: [place] Dialysis Access  Result Date: 02/19/2018 CLINICAL DATA:  67 year old male with end-stage renal disease via a left upper extremity complex arteriovenous fistula which is been previously revised with a hybrid graft in an effort to exclude a large pseudoaneurysm. This operation was performed on Nov 02, 2016 at [place]. Most recently, he underwent fistulagram with angioplasty and peripheral stent placement, as well as drug eluting balloon angioplasty of a central venous stenosis. He presents for ultrasound evaluation today to assess for patency of his arteriovenous fistula. EXAM: ULTRASOUND DIALYSIS ACCESS TECHNIQUE: Grayscale, color Doppler and spectral Doppler evaluation of the left upper extremity arteriovenous fistula. COMPARISON:  None. FINDINGS: The brachial basilic anastomosis is identified in the region of the antecubital fossa. The anastomosis is widely patent. There is an aneurysmal segment in the proximal stick zone. Ultrasound evaluation demonstrates stent material in this region. Vascular flow extends through the interstices  of the stent into a partially thrombosed pseudoaneurysm. The stents and hybrid graft segment all appear patent. There is an irregular hypoechoic collection  in the superficial soft tissues in the mid upper arm which likely represents subcutaneous hematoma, possibly from prior access. IMPRESSION: 1. The left upper extremity brachial basilic arteriovenous fistula and associated hybrid graft remains patent. 2. 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 3. Hematoma is present in the superficial subcutaneous soft tissues overlying the fistula in the mid upper arm, likely secondary to bleeding from a prior access site. 4. Difficult to assess for true stenosis within the hybrid graft, or at the edges of the stents given significant angulation of the fistula itself. Signed, Criselda Peaches, MD [facility] Radiology Electronically Signed   By: Jacqulynn Cadet M.D.   On: 02/19/2018 16:19   Nm Pet Tum Img Whole Body  Result Date: 02/21/2018 CLINICAL DATA:  Initial treatment strategy for fever of unknown origin, bacteremia, leukocytosis, inpatient. EXAM: NUCLEAR MEDICINE PET WHOLE BODY TECHNIQUE: 8.8 mCi F-18 FDG was injected intravenously. Full-ring PET imaging was performed from the skull base to thigh after the radiotracer. CT data was obtained and used for attenuation correction and anatomic localization. Fasting blood glucose: 124 mg/dl COMPARISON:  12/24/2017 CT abdomen/pelvis. 02/16/2018 chest radiograph. FINDINGS: Mediastinal blood pool activity: SUV max 3.4 HEAD/NECK: No hypermetabolic activity in the scalp. No hypermetabolic cervical lymph nodes. Incidental CT findings: Shrunken coarsely calcified right globe. CHEST: No hypermetabolic axillary, mediastinal or hilar nodes. No hypermetabolic pulmonary findings. Incidental CT findings: Mild cardiomegaly. Three-vessel coronary atherosclerosis. Atherosclerotic  nonaneurysmal thoracic aorta. Left subclavian venous stent is in place. Trace dependent right pleural effusion. No acute consolidative airspace disease or lung masses. A few scattered tiny pulmonary nodules in the bilateral upper lobes, largest 4 mm in the right upper lobe (series 3/image 119), below PET resolution. ABDOMEN/PELVIS: No abnormal hypermetabolic activity within the liver, pancreas, adrenal glands, or spleen. No hypermetabolic lymph nodes in the abdomen or pelvis. Incidental CT findings: Diffuse bladder wall thickening. SKELETON: There is hypermetabolism at the C6-7 disc with max SUV 5.5, with associated erosive changes at the C6-7 disc level on the CT images. Incidental CT findings: none EXTREMITIES: Linear mild hypermetabolism in the medial right thigh musculature without abnormal findings on the CT, probably activity related. Otherwise no abnormal hypermetabolic activity in the lower extremities. 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 IMPRESSION: 1. Hypermetabolism and erosive changes at the C6-7 disc, highly suspicious for discitis osteomyelitis, probably the source of the patient's bacteremia and fever. 2. Linear hypermetabolism in the medial right thigh musculature without abnormal findings in the CT, probably activity related. 3. Scattered tiny pulmonary nodules in the upper lobes, below PET resolution, largest 4 mm. No follow-up needed if patient is low-risk (and has no known or suspected primary neoplasm). Non-contrast chest CT can be considered in 12 months if patient is high-risk. This recommendation follows the consensus statement: Guidelines for Management of Incidental Pulmonary Nodules Detected on CT Images:From the Fleischner Society 2017; published online before print (10.1148/radiol.[id number]). 4.  Aortic Atherosclerosis (ICD10-I70.0). 5. Mild cardiomegaly. Three-vessel coronary atherosclerosis. Trace dependent right pleural  effusion. 6. Nonspecific diffuse bladder wall thickening, for which urinalysis correlation could be obtained as clinically warranted. Electronically Signed   By: Ilona Sorrel M.D.   On: 02/21/2018 10:37     Medications:   . sodium chloride    . vancomycin 1,000 mg (02/20/18 1219)   . aspirin EC  81 mg Oral q1800  . atorvastatin  40 mg Oral  q36  . B-complex with vitamin C  1 tablet Oral q1800  . Chlorhexidine Gluconate Cloth  6 each Topical Q0600  . cholecalciferol  1,000 Units Oral Daily  . clopidogrel  75 mg Oral q1800  . docusate sodium  200 mg Oral BID  . feeding supplement (NEPRO CARB STEADY)  237 mL Oral TID BM  . ferrous sulfate  325 mg Oral q1800  . gabapentin  200 mg Oral TID  . heparin  5,000 Units Subcutaneous Q8H  . insulin aspart  0-15 Units Subcutaneous TID WC  . insulin aspart  0-5 Units Subcutaneous QHS  . lactulose  20 g Oral QHS  . lidocaine-prilocaine  1 application Topical Q T,Th,Sa-HD  . lisinopril  5 mg Oral q1800  . mouth rinse  15 mL Mouth Rinse BID  . metoprolol succinate  12.5 mg Oral BID  . multivitamin with minerals   Oral QHS  . mupirocin ointment  1 application Nasal BID  . polyethylene glycol  17 g Oral q1800  . polyvinyl alcohol  1 drop Left Eye QID  . senna  8.6 mg Oral BID  . sertraline  100 mg Oral QHS  . sodium chloride flush  3 mL Intravenous Q12H  . ascorbic acid  500 mg Oral BID   sodium chloride, acetaminophen, benzocaine, hydrocerin, HYDROmorphone (DILAUDID) injection, nitroGLYCERIN, ondansetron **OR** ondansetron (ZOFRAN) IV, oxyCODONE, sodium chloride flush, traMADol  Assessment/ Plan:  67 y.o. African American male with end stage renal disease onHemodialysis, diabetes mellitus type II, hypertension, peripheral vascular disease, coronary artery disease, hyperlipidemia, congestive heart failure, left BKA, hospitalization on6/23/2019forAMS with respiratory failure, left AV graft angioplasty and stent placement February 07, 2018,  hospitalization, gram-negative bacteremia 02/16/2018  TTS [facility] left AVG EDW 80 kg Resident of [facility]   1.  ESRD  2.  Anemia of CKD  3.  SHPTH  4.  MRSA bacteremia.  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  Outpatient follow-up cultures initially showed gram-negative bacteremia BUT final report is negative. He was started on IV Zosyn.  Repeat inpatient cultures are positive for MRSA bacteremia.  Plan: TEE was found to be negative.  Probable discitis at C6/7.  Antibiotic management as per infectious disease.  We will proceed with dialysis again tomorrow if the patient is still here.  Hemoglobin currently 10.4.  Consider starting the patient on Epogen tomorrow.  Phosphorus was also at target at 4.2 and we will plan to recheck this tomorrow if the patient still here.    LOS: 4 Dennis Walker 8/21/201911:24 AM  [place], [place]  Note: This note was prepared with Dragon dictation. Any transcription errors are unintentional

## 2018-02-21 NOTE — Progress Notes (Signed)
Pt g/o neck pain and left shoulder pain No fever OBJECTIVE: BP (!) 167/80 (BP Location: Right Arm)   Pulse 74   Temp 98.2 F (36.8 C) (Oral)   Resp 18   Ht 5\' 11"  (1.803 m)   Wt 79.7 kg   SpO2 100%   BMI 24.51 kg/m   Physical Exam  Constitutional: He is oriented to person, place, and time. s.  HENT:  Head: Normocephalic.  Eyes: Right eye exhibits no discharge (absent rt eye).  Neck:pain back of neck  Cardiovascular: Normal rate and regular rhythm.  Murmur (2/6 systolic murmur) heard. Pulmonary/Chest: Effort normal and breath sounds normal. He has no rales.  Abdominal: Soft. Bowel sounds are normal. He exhibits no distension. There is no tenderness.  Lymphadenopathy:    He has no cervical adenopathy.  Neurological: He is alert and oriented to person, place, and time.  left hemiparesis Rt hand- clawing , wasting of muscles Rt BKA Mood and affect normal  Left arm - graft site covered with dressing   Labs CBC    Component Value Date/Time   WBC 6.7 02/20/2018 1118   RBC 3.78 (L) 02/20/2018 1118   HGB 10.4 (L) 02/20/2018 1118   HCT 31.9 (L) 02/20/2018 1118   PLT 282 02/20/2018 1118   MCV 84.4 02/20/2018 1118   MCH 27.5 02/20/2018 1118   MCHC 32.6 02/20/2018 1118   RDW 17.5 (H) 02/20/2018 1118   LYMPHSABS 1.3 02/16/2018 1026   MONOABS 1.2 (H) 02/16/2018 1026   EOSABS 0.4 02/16/2018 1026   BASOSABS 0.1 02/16/2018 1026   BMP Latest Ref Rng & Units 02/19/2018 02/18/2018 02/17/2018  Glucose 70 - 99 mg/dL 143(H) 146(H) 139(H)  BUN 8 - 23 mg/dL 33(H) 23 41(H)  Creatinine 0.61 - 1.24 mg/dL 5.02(H) 3.85(H) 5.38(H)  Sodium 135 - 145 mmol/L 137 138 137  Potassium 3.5 - 5.1 mmol/L 3.3(L) 3.2(L) 3.0(L)  Chloride 98 - 111 mmol/L 97(L) 99 99  CO2 22 - 32 mmol/L 29 30 29   Calcium 8.9 - 10.3 mg/dL 8.4(L) 8.5(L) 8.3(L)    BC 8/16 MRSA 8/19 and 8/20 NG  67 y.o. 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 Patient underwent manipulation of his AV graft on February 07, 2018 by vascular surgeon.He underwent contrast injection of the left brachial axillary AV axis with percutaneous transluminal angioplasty and stent placement of the peripheral segment to 8 mm.  He also had percutaneous transluminal angioplasty central venous segment to 10 mm with a loop tonics drug-eluting balloon for for a stricture of the AV graft within the peripheral segment as well as stricture within the central venous portion.  Recurrent MRSA bacteremia.  initially Questioned AV graft infection.Marland Kitchen  PET scan was done ( instead of tagged WBC) and it showed hypermetabolism at the C6-7 disc with max SUV 5.5, with associated erosive changes at the C6-7 disc level on the CT images suggestive of discitis. Will discuss with radiologist regarding paraspinal or epidural abscess. TEE neg for endocarditis. He was recently treated with a 6-week course of vancomycin.  MIC is 0.5- very sensitive--will need another prolonged course of Iv vanco during dialysis  Diabetes mellitus on insulin.  Coronary artery disease/hyperlipidemia on atorvastatin, lisinopril, metoprolol  Right BKA many years ago.  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  CVA with left hemiparesis.  Discussed the management with the patient

## 2018-02-22 ENCOUNTER — Other Ambulatory Visit: Payer: Medicare Other

## 2018-02-22 LAB — GLUCOSE, CAPILLARY
Glucose-Capillary: 142 mg/dL — ABNORMAL HIGH (ref 70–99)
Glucose-Capillary: 147 mg/dL — ABNORMAL HIGH (ref 70–99)
Glucose-Capillary: 109 mg/dL — ABNORMAL HIGH (ref 70–99)

## 2018-02-22 LAB — PHOSPHORUS: PHOSPHORUS: 3.6 mg/dL (ref 2.5–4.6)

## 2018-02-22 LAB — VANCOMYCIN, TROUGH: Vancomycin Tr: 15 ug/mL (ref 15–20)

## 2018-02-22 MED ORDER — VANCOMYCIN HCL 10 G IV SOLR
1250.0000 mg | INTRAVENOUS | Status: DC
  Administered 2018-02-22: 1250 mg via INTRAVENOUS
  Filled 2018-02-22 (×3): qty 1250

## 2018-02-22 NOTE — Progress Notes (Signed)
Spoke to radiologist Will order MRI of  cervical spine to look for discitis

## 2018-02-22 NOTE — Progress Notes (Signed)
Pharmacy contacted, asked to bring pt's medication to the dialysis unit, to be given during tx.    02/22/18 1110  Hand-Off documentation  Report given to (Full Name) Stark Bray

## 2018-02-22 NOTE — Progress Notes (Signed)
[place], [place] 02/22/18  Subjective:  Patient seen at bedside. Due for hemodialysis today. No acute complaints at the moment.  Objective:  Vital signs in last 24 hours:  Temp:  [98.2 F (36.8 C)-98.4 F (36.9 C)] 98.4 F (36.9 C) (08/22 0801) Pulse Rate:  [70-74] 71 (08/22 0801) Resp:  [17-18] 17 (08/22 0301) BP: (154-175)/(73-80) 175/77 (08/22 0801) SpO2:  [100 %] 100 % (08/22 0801)  Weight change:  Filed Weights   02/20/18 0922 02/20/18 1330 02/20/18 1401  Weight: 81.3 kg 79.7 kg 79.7 kg    Intake/Output:    Intake/Output Summary (Last 24 hours) at 02/22/2018 1012 Last data filed at 02/22/2018 5462 Gross per 24 hour  Intake 0 ml  Output 0 ml  Net 0 ml     Physical Exam: General:  Chronically ill-appearing, no acute distress  HEENT  moist oral mucous membranes  Neck  supple  Pulm/lungs  normal breathing effort, clear to auscultation  CVS/Heart  S1S2 no rub  Abdomen:   Soft, nontender  Extremities:  No peripheral edema, right BKA  Neurologic:  Alert, oriented, able to follow commands  Skin:  No acute rash  Access:  Left arm AVG       Basic Metabolic Panel:  Recent Labs  Lab 02/16/18 1026 02/17/18 0548 02/18/18 0321 02/19/18 0223 02/20/18 0850  NA 135 137 138 137  --   K 3.1* 3.0* 3.2* 3.3*  --   CL 97* 99 99 97*  --   CO2 28 29 30 29   --   GLUCOSE 177* 139* 146* 143*  --   BUN 31* 41* 23 33*  --   CREATININE 4.42* 5.38* 3.85* 5.02*  --   CALCIUM 8.6* 8.3* 8.5* 8.4*  --   PHOS  --  3.2  --   --  4.2     CBC: Recent Labs  Lab 02/16/18 1026 02/17/18 0548 02/18/18 0321 02/20/18 1118  WBC 9.7 6.2 7.1 6.7  NEUTROABS 6.7*  --   --   --   HGB 12.2* 11.4* 11.0* 10.4*  HCT 37.6* 35.0* 33.8* 31.9*  MCV 85.1 84.8 85.5 84.4  PLT 251 268 262 282      Lab Results  Component Value Date   HEPBSAG Negative 12/26/2017   HEPBSAB Non Reactive 12/26/2017      Microbiology:  Recent Results (from the past 240  hour(s))  Blood Culture (routine x 2)     Status: None   Collection Time: 02/16/18 10:35 AM  Result Value Ref Range Status   Specimen Description BLOOD RT [facility]  Final   Special Requests   Final    BOTTLES DRAWN AEROBIC AND ANAEROBIC Blood Culture adequate volume   Culture   Final    NO GROWTH 5 DAYS Performed at [facility], [address] [place], [place] [address]    Report Status 02/21/2018 FINAL  Final  Blood Culture (routine x 2)     Status: Abnormal   Collection Time: 02/16/18 11:46 AM  Result Value Ref Range Status   Specimen Description BLOOD RIGHT HAND  Final   Special Requests   Final    BOTTLES DRAWN AEROBIC AND ANAEROBIC Blood Culture adequate volume Performed at [facility], [address] [place], [place] [address]    Culture  Setup Time   Final    GRAM POSITIVE COCCI ANAEROBIC BOTTLE ONLY CRITICAL RESULT CALLED TO, READ BACK BY AND VERIFIED WITH: [facility] ZOMPA AT [facility] 02/17/18 SDR  Performed at [facility], [place] [address], [place], [place] [address]    Culture METHICILLIN RESISTANT STAPHYLOCOCCUS AUREUS (A)  Final   Report Status 02/19/2018 FINAL  Final   Organism ID, Bacteria METHICILLIN RESISTANT STAPHYLOCOCCUS AUREUS  Final      Susceptibility   Methicillin resistant staphylococcus aureus - MIC*    CIPROFLOXACIN >=8 RESISTANT Resistant     ERYTHROMYCIN >=8 RESISTANT Resistant     GENTAMICIN <=0.5 SENSITIVE Sensitive     OXACILLIN >=4 RESISTANT Resistant     TETRACYCLINE <=1 SENSITIVE Sensitive     VANCOMYCIN <=0.5 SENSITIVE Sensitive     TRIMETH/SULFA <=10 SENSITIVE Sensitive     CLINDAMYCIN <=0.25 SENSITIVE Sensitive     RIFAMPIN <=0.5 SENSITIVE Sensitive     Inducible Clindamycin NEGATIVE Sensitive     * METHICILLIN RESISTANT STAPHYLOCOCCUS AUREUS  Blood Culture ID Panel (Reflexed)     Status: Abnormal   Collection Time: 02/16/18 11:46 AM  Result Value Ref Range Status   Enterococcus species NOT DETECTED NOT DETECTED Final    Listeria monocytogenes NOT DETECTED NOT DETECTED Final   Staphylococcus species DETECTED (A) NOT DETECTED Final    Comment: CRITICAL RESULT CALLED TO, READ BACK BY AND VERIFIED WITH:  HANK ZOMPA AT 0818 02/17/18 SDR    Staphylococcus aureus DETECTED (A) NOT DETECTED Final    Comment: Methicillin (oxacillin)-resistant Staphylococcus aureus (MRSA). MRSA is predictably resistant to beta-lactam antibiotics (except ceftaroline). Preferred therapy is vancomycin unless clinically contraindicated. Patient requires contact precautions if  hospitalized. CRITICAL RESULT CALLED TO, READ BACK BY AND VERIFIED WITH:  HANK ZOMPA AT 0818 02/17/18 SDR    Methicillin resistance DETECTED (A) NOT DETECTED Final    Comment: CRITICAL RESULT CALLED TO, READ BACK BY AND VERIFIED WITH: HANK ZOMPA AT 0818 02/17/18 SDR    Streptococcus species NOT DETECTED NOT DETECTED Final   Streptococcus agalactiae NOT DETECTED NOT DETECTED Final   Streptococcus pneumoniae NOT DETECTED NOT DETECTED Final   Streptococcus pyogenes NOT DETECTED NOT DETECTED Final   Acinetobacter baumannii NOT DETECTED NOT DETECTED Final   Enterobacteriaceae species NOT DETECTED NOT DETECTED Final   Enterobacter cloacae complex NOT DETECTED NOT DETECTED Final   Escherichia coli NOT DETECTED NOT DETECTED Final   Klebsiella oxytoca NOT DETECTED NOT DETECTED Final   Klebsiella pneumoniae NOT DETECTED NOT DETECTED Final   Proteus species NOT DETECTED NOT DETECTED Final   Serratia marcescens NOT DETECTED NOT DETECTED Final   Haemophilus influenzae NOT DETECTED NOT DETECTED Final   Neisseria meningitidis NOT DETECTED NOT DETECTED Final   Pseudomonas aeruginosa NOT DETECTED NOT DETECTED Final   Candida albicans NOT DETECTED NOT DETECTED Final   Candida glabrata NOT DETECTED NOT DETECTED Final   Candida krusei NOT DETECTED NOT DETECTED Final   Candida parapsilosis NOT DETECTED NOT DETECTED Final   Candida tropicalis NOT DETECTED NOT DETECTED Final     Comment: Performed at [facility], [address] [place], [place] [address]  MRSA PCR Screening     Status: Abnormal   Collection Time: 02/16/18  6:05 PM  Result Value Ref Range Status   MRSA by PCR POSITIVE (A) NEGATIVE Final    Comment:        The GeneXpert MRSA Assay (FDA approved for NASAL specimens only), is one component of a comprehensive MRSA colonization surveillance program. It is not intended to diagnose MRSA infection nor to guide or monitor treatment for MRSA infections. RESULT CALLED TO, READ BACK BY AND VERIFIED WITH: Noah Delaine AT  1920 02/16/18.PMH Performed at [facility], [address] [place], [place] [address]   CULTURE, BLOOD (ROUTINE X 2) w Reflex to ID Panel     Status: None (Preliminary result)   Collection Time: 02/19/18 12:48 AM  Result Value Ref Range Status   Specimen Description BLOOD RIGHT WRIST  Final   Special Requests   Final    BOTTLES DRAWN AEROBIC AND ANAEROBIC Blood Culture adequate volume   Culture   Final    NO GROWTH 3 DAYS Performed at [facility], [address], [place], [place] [address]    Report Status PENDING  Incomplete  CULTURE, BLOOD (ROUTINE X 2) w Reflex to ID Panel     Status: None (Preliminary result)   Collection Time: 02/19/18 12:48 AM  Result Value Ref Range Status   Specimen Description BLOOD RIGHT HAND  Final   Special Requests   Final    BOTTLES DRAWN AEROBIC AND ANAEROBIC Blood Culture adequate volume   Culture   Final    NO GROWTH 3 DAYS Performed at [facility], [address], [place], [place] [address]    Report Status PENDING  Incomplete  CULTURE, BLOOD (ROUTINE X 2) w Reflex to ID Panel     Status: None (Preliminary result)   Collection Time: 02/20/18  8:59 AM  Result Value Ref Range Status   Specimen Description HEMODIALYSIS FISTULA  Final   Special Requests NONE  Final   Culture   Final    NO GROWTH 2 DAYS Performed at [facility], [address] [place], [place] [address]    Report Status PENDING  Incomplete  CULTURE, BLOOD (ROUTINE X 2) w Reflex to ID Panel     Status: None (Preliminary result)   Collection Time: 02/20/18 10:47 AM  Result Value Ref Range Status   Specimen Description HEMODIALYSIS FISTULA  Final   Special Requests NONE  Final   Culture   Final    NO GROWTH 2 DAYS Performed at [facility], [address] [place], [place] [address]    Report Status PENDING  Incomplete    Coagulation Studies: No results for input(s): LABPROT, INR in the last 72 hours.  Urinalysis: No results for input(s): COLORURINE, LABSPEC, PHURINE, GLUCOSEU, HGBUR, BILIRUBINUR, KETONESUR, PROTEINUR, UROBILINOGEN, NITRITE, LEUKOCYTESUR in the last 72 hours.  Invalid input(s): APPERANCEUR    Imaging: Nm Pet Tum Img Whole Body  Result Date: 02/21/2018 CLINICAL DATA:  Initial treatment strategy for fever of unknown origin, bacteremia, leukocytosis, inpatient. EXAM: NUCLEAR MEDICINE PET WHOLE BODY TECHNIQUE: 8.8 mCi F-18 FDG was injected intravenously. Full-ring PET imaging was performed from the skull base to thigh after the radiotracer. CT data was obtained and used for attenuation correction and anatomic localization. Fasting blood glucose: 124 mg/dl COMPARISON:  12/24/2017 CT abdomen/pelvis. 02/16/2018 chest radiograph. FINDINGS: Mediastinal blood pool activity: SUV max 3.4 HEAD/NECK: No hypermetabolic activity in the scalp. No hypermetabolic cervical lymph nodes. Incidental CT findings: Shrunken coarsely calcified right globe. CHEST: No hypermetabolic axillary, mediastinal or hilar nodes. No hypermetabolic pulmonary findings. Incidental CT findings: Mild cardiomegaly. Three-vessel coronary atherosclerosis. Atherosclerotic nonaneurysmal thoracic aorta. Left subclavian venous stent is in place. Trace dependent right pleural effusion. No acute consolidative airspace disease or lung masses. A few scattered tiny pulmonary  nodules in the bilateral upper lobes, largest 4 mm in the right upper lobe (series 3/image 119), below PET resolution. ABDOMEN/PELVIS: No abnormal hypermetabolic activity within the liver, pancreas, adrenal glands, or spleen. No hypermetabolic lymph  nodes in the abdomen or pelvis. Incidental CT findings: Diffuse bladder wall thickening. SKELETON: There is hypermetabolism at the C6-7 disc with max SUV 5.5, with associated erosive changes at the C6-7 disc level on the CT images. Incidental CT findings: none EXTREMITIES: Linear mild hypermetabolism in the medial right thigh musculature without abnormal findings on the CT, probably activity related. Otherwise no abnormal hypermetabolic activity in the lower extremities. 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 IMPRESSION: 1. Hypermetabolism and erosive changes at the C6-7 disc, highly suspicious for discitis osteomyelitis, probably the source of the patient's bacteremia and fever. 2. Linear hypermetabolism in the medial right thigh musculature without abnormal findings in the CT, probably activity related. 3. Scattered tiny pulmonary nodules in the upper lobes, below PET resolution, largest 4 mm. No follow-up needed if patient is low-risk (and has no known or suspected primary neoplasm). Non-contrast chest CT can be considered in 12 months if patient is high-risk. This recommendation follows the consensus statement: Guidelines for Management of Incidental Pulmonary Nodules Detected on CT Images:From the Fleischner Society 2017; published online before print (10.1148/radiol.3220254270). 4.  Aortic Atherosclerosis (ICD10-I70.0). 5. Mild cardiomegaly. Three-vessel coronary atherosclerosis. Trace dependent right pleural effusion. 6. Nonspecific diffuse bladder wall thickening, for which urinalysis correlation could be obtained as clinically warranted. Electronically Signed   By: Ilona Sorrel M.D.   On: 02/21/2018  10:37     Medications:   . sodium chloride    . vancomycin     . aspirin EC  81 mg Oral q1800  . atorvastatin  40 mg Oral q1800  . B-complex with vitamin C  1 tablet Oral q1800  . cholecalciferol  1,000 Units Oral Daily  . clopidogrel  75 mg Oral q1800  . docusate sodium  200 mg Oral BID  . feeding supplement (NEPRO CARB STEADY)  237 mL Oral TID BM  . ferrous sulfate  325 mg Oral q1800  . gabapentin  200 mg Oral TID  . heparin  5,000 Units Subcutaneous Q8H  . insulin aspart  0-15 Units Subcutaneous TID WC  . insulin aspart  0-5 Units Subcutaneous QHS  . lactulose  20 g Oral QHS  . lidocaine-prilocaine  1 application Topical Q T,Th,Sa-HD  . lisinopril  5 mg Oral q1800  . mouth rinse  15 mL Mouth Rinse BID  . metoprolol succinate  12.5 mg Oral BID  . multivitamin with minerals   Oral QHS  . polyethylene glycol  17 g Oral q1800  . polyvinyl alcohol  1 drop Left Eye QID  . senna  8.6 mg Oral BID  . sertraline  100 mg Oral QHS  . sodium chloride flush  3 mL Intravenous Q12H  . ascorbic acid  500 mg Oral BID   sodium chloride, acetaminophen, benzocaine, hydrocerin, HYDROmorphone (DILAUDID) injection, nitroGLYCERIN, ondansetron **OR** ondansetron (ZOFRAN) IV, oxyCODONE, sodium chloride flush, traMADol  Assessment/ Plan:  67 y.o. African American male with end stage renal disease onHemodialysis, diabetes mellitus type II, hypertension, peripheral vascular disease, coronary artery disease, hyperlipidemia, congestive heart failure, left BKA, hospitalization on6/23/2019forAMS with respiratory failure, left AV graft angioplasty and stent placement February 07, 2018, hospitalization, gram-negative bacteremia 02/16/2018  TTS [facility] left AVG EDW 80 kg Resident of [facility]   1.  ESRD on HD TTHS  2.  Anemia of CKD, hgb 10.4  3.  SHPTH  4.  MRSA bacteremia.  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  Outpatient follow-up  cultures initially showed gram-negative bacteremia BUT final report is negative. He was started on IV Zosyn.  Repeat inpatient cultures are positive for MRSA bacteremia. TEE negative for vegetation.  Cervical discitis noted.   Plan: Patient is due for hemodialysis today.  We have prepared orders.  Hemoglobin stable at 10.4.  Hold off on Epogen for now.  Repeat serum phosphorus today.  Patient will need prolonged vancomycin for cervical discitis as per infectious disease.  Otherwise plan per hospitalist.    LOS: 5 Taz Vanness [place] 8/22/201910:12 AM  [place], [place]  Note: This note was prepared with Dragon dictation. Any transcription errors are unintentional

## 2018-02-22 NOTE — Progress Notes (Signed)
Pre HD tx    02/22/18 1214  Neurological  Level of Consciousness Alert  Orientation Level Oriented X4  Respiratory  Respiratory Pattern Regular;Unlabored  Chest Assessment Chest expansion symmetrical  Cardiac  ECG Monitor Yes  Vascular  R Radial Pulse +2  L Radial Pulse +1  Edema Generalized  Integumentary  Integumentary (WDL) X  Skin Color Appropriate for ethnicity  Musculoskeletal  Musculoskeletal (WDL) X  Generalized Weakness Yes  Assistive Device None  GU Assessment  Genitourinary (WDL) X  Genitourinary Symptoms  (HD)  Psychosocial  Psychosocial (WDL) WDL

## 2018-02-22 NOTE — Progress Notes (Addendum)
Pharmacy Antibiotic Note  Dennis Walker is a 67 y.o. male admitted on 02/16/2018 with MRSA bacteremia.  Pharmacy has been consulted for vancomycin dosing.  Plan: Trough 15 today @ 0500. Will increase vanc to 1250 mg IV to be given toward the end of each dialysis session. Target goal trough of 15-25 mcg/ml. Will order next trough for 8/27 @ 0500 if the patient is still here  Height: 5\' 11"  (180.3 cm) Weight: 175 lb 11.3 oz (79.7 kg) IBW/kg (Calculated) : 75.3  Temp (24hrs), Avg:98.3 F (36.8 C), Min:98.2 F (36.8 C), Max:98.4 F (36.9 C)  Recent Labs  Lab 02/16/18 1025 02/16/18 1026 02/16/18 1539 02/17/18 0548 02/18/18 0321 02/19/18 0223 02/20/18 1118 02/22/18 0445  WBC  --  9.7  --  6.2 7.1  --  6.7  --   CREATININE  --  4.42*  --  5.38* 3.85* 5.02*  --   --   LATICACIDVEN 1.2  --  0.8  --   --   --   --   --   VANCOTROUGH  --   --   --   --   --   --   --  15    Estimated Creatinine Clearance: 15.4 mL/min (A) (by C-G formula based on SCr of 5.02 mg/dL (H)).    Allergies  Allergen Reactions  . Cefuroxime Itching  . Shrimp [Shellfish Allergy] Other (See Comments)    Per mar. Unknown reaction   . Sulfa Antibiotics Itching and Rash    Antimicrobials this admission: Zosyn 8/16 >> 8/18 Vanc 8/17 >>   Dose adjustments this admission: 8/22 Vanc 1 g IV w/ dialysis >> 1250 mg IV w/ dialysis  Microbiology results: 8/16 BCx: 1/2 MRSA 8/19 BCx: NGTD  8/20 BCx (from HD fistula): NGTD 8/16 MRSA PCR: positive  Thank you for allowing pharmacy to be a part of this patient's care.  Tawnya Crook, PharmD Pharmacy Resident  02/22/2018 8:32 AM

## 2018-02-22 NOTE — Progress Notes (Signed)
HD tx end    02/22/18 1607  Vital Signs  Pulse Rate 80  Pulse Rate Source Monitor  Resp (!) 23  BP (!) 188/79  BP Location Right Arm  BP Method Automatic  Patient Position (if appropriate) Lying  Oxygen Therapy  SpO2 96 %  O2 Device Room Air  During Hemodialysis Assessment  Dialysis Fluid Bolus Normal Saline  Bolus Amount (mL) 250 mL  Intra-Hemodialysis Comments Tx completed

## 2018-02-22 NOTE — Progress Notes (Signed)
Post HD assessment. Pt tolerated tx well without c/o or complication. Net UF 1281, goal met.    02/22/18 1616  Vital Signs  Temp 98.3 F (36.8 C)  Temp Source Oral  Pulse Rate 82  Pulse Rate Source Monitor  Resp 16  BP (!) 185/87  BP Location Right Arm  BP Method Automatic  Patient Position (if appropriate) Lying  Oxygen Therapy  SpO2 99 %  O2 Device Room Air  Dialysis Weight  Weight 78.8 kg  Type of Weight Post-Dialysis  Post-Hemodialysis Assessment  Rinseback Volume (mL) 250 mL  KECN 78.6 V  Dialyzer Clearance Lightly streaked  Duration of HD Treatment -hour(s) 3.5 hour(s)  Hemodialysis Intake (mL) 750 mL  UF Total -Machine (mL) 2031 mL  Net UF (mL) 1281 mL  Tolerated HD Treatment Yes  AVG/AVF Arterial Site Held (minutes) 10 minutes  AVG/AVF Venous Site Held (minutes) 10 minutes  Education / Care Plan  Dialysis Education Provided Yes  Documented Education in Care Plan Yes  Fistula / Graft Left Upper arm Arteriovenous fistula  No Placement Date or Time found.   Orientation: Left  Access Location: Upper arm  Access Type: Arteriovenous fistula  Site Condition No complications  Fistula / Graft Assessment Present;Thrill;Bruit  Status Deaccessed  Drainage Description None

## 2018-02-22 NOTE — Progress Notes (Signed)
[facility] [place] [facility] NAME: Dennis Walker    MR#:  [id number]  DATE OF BIRTH:  1951/01/21  SUBJECTIVE:  CHIEF COMPLAINT:   Chief Complaint  Patient presents with  . Weakness   The patient has better neck pain. REVIEW OF SYSTEMS:  CONSTITUTIONAL: No fever, fatigue or weakness.  EYES: No blurred or double vision.  EARS, NOSE, AND THROAT: No tinnitus or ear pain.  RESPIRATORY: No cough, shortness of breath, wheezing or hemoptysis.  CARDIOVASCULAR: No chest pain, orthopnea, edema.  GASTROINTESTINAL: No nausea, vomiting, diarrhea or abdominal pain.  GENITOURINARY: No dysuria, hematuria.  ENDOCRINE: No polyuria, nocturia,  HEMATOLOGY: No anemia, easy bruising or bleeding SKIN: No rash or lesion. MUSCULOSKELETAL: No joint pain or arthritis.  Neck pain. NEUROLOGIC: No tingling, numbness, weakness.  PSYCHIATRY: No anxiety or depression.   ROS  DRUG ALLERGIES:   Allergies  Allergen Reactions  . Cefuroxime Itching  . Shrimp [Shellfish Allergy] Other (See Comments)    Per mar. Unknown reaction   . Sulfa Antibiotics Itching and Rash    VITALS:  Blood pressure (!) 149/73, pulse 68, temperature (!) 97.5 F (36.4 C), temperature source Oral, resp. rate (!) 28, height 5\' 11"  (1.803 m), weight 79.5 kg, SpO2 100 %.  PHYSICAL EXAMINATION:  GENERAL:  67 y.o.-year-old patient lying in the bed with no acute distress.  EYES: Pupils equal, round, reactive to light and accommodation. No scleral icterus. Extraocular muscles intact.  HEENT: Head atraumatic, normocephalic. Oropharynx and nasopharynx clear.  NECK:  Supple, no jugular venous distention. No thyroid enlargement, no tenderness.  LUNGS: Normal breath sounds bilaterally, no wheezing, rales,rhonchi or crepitation. No use of accessory muscles of respiration.  CARDIOVASCULAR: S1, S2 normal. No murmurs, rubs, or gallops.  ABDOMEN: Soft, nontender, nondistended. Bowel sounds present. No organomegaly  or mass.  EXTREMITIES: No pedal edema, cyanosis, or clubbing.  Right BKA.  Right hand contracted. NEUROLOGIC: Cranial nerves II through XII are intact. Muscle strength 4/5 in all extremities. Sensation intact. Gait not checked.  PSYCHIATRIC: The patient is alert and oriented x 3.  SKIN: No obvious rash, lesion, or ulcer.   Physical Exam LABORATORY PANEL:   CBC Recent Labs  Lab 02/20/18 1118  WBC 6.7  HGB 10.4*  HCT 31.9*  PLT 282   ------------------------------------------------------------------------------------------------------------------  Chemistries  Recent Labs  Lab 02/16/18 1026  02/19/18 0223  NA 135   < > 137  K 3.1*   < > 3.3*  CL 97*   < > 97*  CO2 28   < > 29  GLUCOSE 177*   < > 143*  BUN 31*   < > 33*  CREATININE 4.42*   < > 5.02*  CALCIUM 8.6*   < > 8.4*  AST 21  --   --   ALT 11  --   --   ALKPHOS 103  --   --   BILITOT 0.9  --   --    < > = values in this interval not displayed.   ------------------------------------------------------------------------------------------------------------------  Cardiac Enzymes Recent Labs  Lab 02/16/18 1026  TROPONINI 0.09*   ------------------------------------------------------------------------------------------------------------------  RADIOLOGY:  Nm Pet Tum Img Whole Body  Result Date: 02/21/2018 CLINICAL DATA:  Initial treatment strategy for fever of unknown origin, bacteremia, leukocytosis, inpatient. EXAM: NUCLEAR MEDICINE PET WHOLE BODY TECHNIQUE: 8.8 mCi F-18 FDG was injected intravenously. Full-ring PET imaging was performed from the skull base to thigh after the radiotracer. CT data was obtained and used  for attenuation correction and anatomic localization. Fasting blood glucose: 124 mg/dl COMPARISON:  12/24/2017 CT abdomen/pelvis. 02/16/2018 chest radiograph. FINDINGS: Mediastinal blood pool activity: SUV max 3.4 HEAD/NECK: No hypermetabolic activity in the scalp. No hypermetabolic cervical lymph  nodes. Incidental CT findings: Shrunken coarsely calcified right globe. CHEST: No hypermetabolic axillary, mediastinal or hilar nodes. No hypermetabolic pulmonary findings. Incidental CT findings: Mild cardiomegaly. Three-vessel coronary atherosclerosis. Atherosclerotic nonaneurysmal thoracic aorta. Left subclavian venous stent is in place. Trace dependent right pleural effusion. No acute consolidative airspace disease or lung masses. A few scattered tiny pulmonary nodules in the bilateral upper lobes, largest 4 mm in the right upper lobe (series 3/image 119), below PET resolution. ABDOMEN/PELVIS: No abnormal hypermetabolic activity within the liver, pancreas, adrenal glands, or spleen. No hypermetabolic lymph nodes in the abdomen or pelvis. Incidental CT findings: Diffuse bladder wall thickening. SKELETON: There is hypermetabolism at the C6-7 disc with max SUV 5.5, with associated erosive changes at the C6-7 disc level on the CT images. Incidental CT findings: none EXTREMITIES: Linear mild hypermetabolism in the medial right thigh musculature without abnormal findings on the CT, probably activity related. Otherwise no abnormal hypermetabolic activity in the lower extremities. 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 IMPRESSION: 1. Hypermetabolism and erosive changes at the C6-7 disc, highly suspicious for discitis osteomyelitis, probably the source of the patient's bacteremia and fever. 2. Linear hypermetabolism in the medial right thigh musculature without abnormal findings in the CT, probably activity related. 3. Scattered tiny pulmonary nodules in the upper lobes, below PET resolution, largest 4 mm. No follow-up needed if patient is low-risk (and has no known or suspected primary neoplasm). Non-contrast chest CT can be considered in 12 months if patient is high-risk. This recommendation follows the consensus statement: Guidelines for Management of  Incidental Pulmonary Nodules Detected on CT Images:From the Fleischner Society 2017; published online before print (10.1148/radiol.[id number]). 4.  Aortic Atherosclerosis (ICD10-I70.0). 5. Mild cardiomegaly. Three-vessel coronary atherosclerosis. Trace dependent right pleural effusion. 6. Nonspecific diffuse bladder wall thickening, for which urinalysis correlation could be obtained as clinically warranted. Electronically Signed   By: Ilona Sorrel M.D.   On: 02/21/2018 10:37    ASSESSMENT AND PLAN:  67 year old African-American male with past medical history significant for end-stage renal disease on Tuesday Thursday Saturday hemodialysis, hypertension, peripheral vascular disease, CAD, CHF presents to hospital secondary to generalized body aches and noted to be bacteremic  *Acute recurrent MRSA bacteremia  Stable history of MRSA bacteremia few months ago. - Status post course of IV vancomycin S/p angioplasty and stent placement in the left upper extremity AV graft 3 weeks ago. F/U vascular surgery for removal of stent a possibility in this case. Repeat blood cultures August 19 negative thus far, repeat blood cultures are negative so far. 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 Continue vancomycin IV.   MRI without contrast of cervical spine, the patient need another prolonged course of Iv vanco during hemodialysis per Dr. Delaine Lame.  *Chronic end-stage renal disease on hemodialysis Currently receiving hemodialysis Nephrology input appreciated Receives hemodialysis on TTS  *Hypertension Stable on lisinopril and metoprolol  Chronic peripheral vascular disease Continue Plavix  *Hypokalemia Correction with dialysis given ESRD  All the records are reviewed and case discussed with Care Management/Social Workerr. Management plans discussed with the patient, family and they are in agreement.  CODE STATUS:  full TOTAL TIME TAKING CARE OF THIS PATIENT: 27 minutes.  POSSIBLE D/C IN 2 DAYS, DEPENDING ON CLINICAL CONDITION.   Demetrios Loll M.D on 02/22/2018   Between 7am to 6pm - Pager - [phone]  After 6pm go to [email or web address] - password [email or web address] [facility]  Office  [phone]  CC: Primary care physician; Juluis Pitch, MD  Note: This dictation was prepared with Dragon dictation along with smaller phrase technology. Any transcriptional errors that result from this process are unintentional.

## 2018-02-22 NOTE — Progress Notes (Signed)
Pre HD assessment    02/22/18 1213  Vital Signs  Temp (!) 97.5 F (36.4 C)  Temp Source Oral  Pulse Rate 70  Pulse Rate Source Monitor  Resp 18  BP (!) 161/77  BP Location Right Arm  BP Method Automatic  Patient Position (if appropriate) Lying  Oxygen Therapy  SpO2 100 %  O2 Device Room Air  Pain Assessment  Pain Scale 0-10  Pain Score 0  Dialysis Weight  Weight 79.5 kg  Type of Weight Pre-Dialysis  Time-Out for Hemodialysis  What Procedure? HD  Pt Identifiers(min of two) First/Last Name;MRN/Account#  Correct Site? Yes  Correct Side? Yes  Correct Procedure? Yes  Consents Verified? Yes  Rad Studies Available? N/A  Safety Precautions Reviewed? Yes  Engineer, civil (consulting) Number  ([id number])  Station Number 4  UF/Alarm Test Passed  Conductivity: Meter 13.8  Conductivity: Machine  13.9  pH 7.6  Reverse Osmosis main  Normal Saline Lot Number [id number]  Dialyzer Lot Number [id number]  Disposable Set Lot Number [id number]  Machine Temperature 98.6 F (37 C)  Musician and Audible Yes  Blood Lines Intact and Secured Yes  Pre Treatment Patient Checks  Vascular access used during treatment Graft  Hepatitis B Surface Antigen Results Negative  Date Hepatitis B Surface Antigen Drawn 12/26/17  Hepatitis B Surface Antibody 17  Date Hepatitis B Surface Antibody Drawn 01/23/18  Hemodialysis Consent Verified Yes  Hemodialysis Standing Orders Initiated Yes  ECG (Telemetry) Monitor On Yes  Prime Ordered Normal Saline  Length of  DialysisTreatment -hour(s) 3.5 Hour(s)  Dialyzer Elisio 17H NR  Dialysate 2K, 2.5 Ca  Dialysis Anticoagulant None  Dialysate Flow Ordered 800  Blood Flow Rate Ordered 400 mL/min  Ultrafiltration Goal 1.5 Liters  Pre Treatment Labs Phosphorus  Dialysis Blood Pressure Support Ordered Normal Saline  Education / Care Plan  Dialysis Education Provided Yes  Documented Education in Care Plan Yes  Fistula / Graft Left Upper arm Arteriovenous fistula  No  Placement Date or Time found.   Orientation: Left  Access Location: Upper arm  Access Type: Arteriovenous fistula  Site Condition No complications  Fistula / Graft Assessment Present;Thrill;Bruit  Drainage Description None

## 2018-02-22 NOTE — Progress Notes (Signed)
Post HD assessment    02/22/18 1615  Neurological  Level of Consciousness Alert  Orientation Level Oriented X4  Respiratory  Respiratory Pattern Regular;Unlabored  Chest Assessment Chest expansion symmetrical  Cardiac  Pulse Irregular  ECG Monitor Yes  Vascular  R Radial Pulse +2  L Radial Pulse +1  Edema Generalized  Integumentary  Integumentary (WDL) X  Skin Color Appropriate for ethnicity  Musculoskeletal  Musculoskeletal (WDL) X  Generalized Weakness Yes  Assistive Device None  GU Assessment  Genitourinary (WDL) X  Genitourinary Symptoms  (HD)  Psychosocial  Psychosocial (WDL) WDL

## 2018-02-22 NOTE — Progress Notes (Signed)
HD tx start    02/22/18 1227  Vital Signs  Pulse Rate 68  Pulse Rate Source Monitor  Resp 13  BP (!) 155/73  BP Location Right Arm  BP Method Automatic  Patient Position (if appropriate) Lying  Oxygen Therapy  SpO2 100 %  O2 Device Room Air  During Hemodialysis Assessment  Blood Flow Rate (mL/min) 400 mL/min  Arterial Pressure (mmHg) -180 mmHg  Venous Pressure (mmHg) 170 mmHg  Transmembrane Pressure (mmHg) 50 mmHg  Ultrafiltration Rate (mL/min) 570 mL/min  Dialysate Flow Rate (mL/min) 800 ml/min  Conductivity: Machine  13.9  HD Safety Checks Performed Yes  Dialysis Fluid Bolus Normal Saline  Bolus Amount (mL) 250 mL  Intra-Hemodialysis Comments Tx initiated  Fistula / Graft Left Upper arm Arteriovenous fistula  No Placement Date or Time found.   Orientation: Left  Access Location: Upper arm  Access Type: Arteriovenous fistula  Status Accessed  Needle Size 15

## 2018-02-22 NOTE — Progress Notes (Signed)
Per RN in progression rounds this morning patient will need 6 weeks of IV Vanc and will likely get it at dialysis. Clinical Social Worker (CSW) contacted Tammy admissions coordinator at [facility] and made her aware of above. CSW also left [facility] dialysis coordinator a voicemail making her aware of above. CSW will continue to follow and assist as needed.   [employer], LCSW [phone]

## 2018-02-23 ENCOUNTER — Inpatient Hospital Stay: Payer: Medicare Other

## 2018-02-23 LAB — GLUCOSE, CAPILLARY
GLUCOSE-CAPILLARY: 129 mg/dL — AB (ref 70–99)
Glucose-Capillary: 141 mg/dL — ABNORMAL HIGH (ref 70–99)
Glucose-Capillary: 126 mg/dL — ABNORMAL HIGH (ref 70–99)

## 2018-02-23 MED ORDER — VANCOMYCIN HCL 10 G IV SOLR: 1250.0000 mg | INTRAVENOUS | Status: DC

## 2018-02-23 MED ORDER — LISINOPRIL 10 MG PO TABS
10.0000 mg | ORAL_TABLET | Freq: Every day | ORAL | Status: DC
  Administered 2018-02-23: 10 mg via ORAL
  Filled 2018-02-23: qty 1

## 2018-02-23 MED ORDER — METOPROLOL SUCCINATE ER 25 MG PO TB24: 25.0000 mg | ORAL_TABLET | Freq: Two times a day (BID) | ORAL | Status: DC

## 2018-02-23 MED ORDER — LISINOPRIL 10 MG PO TABS: 10.0000 mg | ORAL_TABLET | Freq: Every day | ORAL | Status: AC

## 2018-02-23 NOTE — Progress Notes (Signed)
Pt c/o neck pain and left shoulder pain No fever Able to move rt side of the body Left h/o CVA with hemiparesis-   OBJECTIVE: BP (!) 158/78   Pulse 68   Temp 98.9 F (37.2 C) (Oral)   Resp 17   Ht 5\' 11"  (1.803 m)   Wt 78.8 kg   SpO2 99%   BMI 24.23 kg/m   Physical Exam  Constitutional: He is oriented to person, place, and time.  HENT:  Head: Normocephalic.  Eyes: Right eye exhibits no discharge (absent rt eye).  Neck:pain back of neck  Cardiovascular: Normal rate and regular rhythm.  Murmur (2/6 systolic murmur) heard. Pulmonary/Chest: Effort normal and breath sounds normal. He has no rales.  Abdominal: Soft. Bowel sounds are normal. He exhibits no distension. There is no tenderness.  Lymphadenopathy:    He has no cervical adenopathy.  Neurological: He is alert and oriented to person, place, and time.  left hemiparesis Rt hand- clawing , wasting of muscles Rt BKA Mood and affect normal  Left arm - graft site covered with dressing   Labs CBC    Component Value Date/Time   WBC 6.7 02/20/2018 1118   RBC 3.78 (L) 02/20/2018 1118   HGB 10.4 (L) 02/20/2018 1118   HCT 31.9 (L) 02/20/2018 1118   PLT 282 02/20/2018 1118   MCV 84.4 02/20/2018 1118   MCH 27.5 02/20/2018 1118   MCHC 32.6 02/20/2018 1118   RDW 17.5 (H) 02/20/2018 1118   LYMPHSABS 1.3 02/16/2018 1026   MONOABS 1.2 (H) 02/16/2018 1026   EOSABS 0.4 02/16/2018 1026   BASOSABS 0.1 02/16/2018 1026   BMP Latest Ref Rng & Units 02/19/2018 02/18/2018 02/17/2018  Glucose 70 - 99 mg/dL 143(H) 146(H) 139(H)  BUN 8 - 23 mg/dL 33(H) 23 41(H)  Creatinine 0.61 - 1.24 mg/dL 5.02(H) 3.85(H) 5.38(H)  Sodium 135 - 145 mmol/L 137 138 137  Potassium 3.5 - 5.1 mmol/L 3.3(L) 3.2(L) 3.0(L)  Chloride 98 - 111 mmol/L 97(L) 99 99  CO2 22 - 32 mmol/L 29 30 29   Calcium 8.9 - 10.3 mg/dL 8.4(L) 8.5(L) 8.3(L)    BC 8/16 MRSA 8/19 and 8/20 NG  67 y.o. 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  He also had percutaneous transluminal angioplasty central venous segment to 10 mm with a loop tonics drug-eluting balloon for for a stricture of the AV graft within the peripheral segment as well as stricture within the central venous portion.  Recurrent MRSA bacteremia.  initially Questioned AV graft infection.Marland Kitchen  PET scan was done ( instead of tagged WBC) and it showed hypermetabolism at the C6-7 disc with max SUV 5.5, with associated erosive changes at the C6-7 disc level on the CT images suggestive of discitis.  Discussed  with [facility] radiologist. Not sure whether this is infection VS ESRD- MRI of the cervical spine was done today and as discussed with radiologist and it shows   the presence of discitis and osteomyelitis, involving the C5 through C7 vertebral bodies and  intervening disc spaces. 2. Associated extensive paraspinal inflammation with a ventral epidural fluid collection most likely representing an abscess. This contributes to cord compression without abnormal cord signal. In addition, there is a retropharyngeal effusion. Marland Kitchen He needs neurosurgery consult to see whether any surgical intervention is needed    TEE neg for endocarditis. He was recently treated with a 6-week course of vancomycin.  MIC is 0.5- very sensitive--will need another prolonged course of Iv vanco during dialysis  Diabetes mellitus on insulin.  Coronary artery  disease/hyperlipidemia on atorvastatin, lisinopril, metoprolol  Right BKA many years ago.  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  CVA with left hemiparesis.  Discussed the management with the patient and hospitalist  ID will follow him peripherally this weekend Call ID on call  if needed over weeked

## 2018-02-23 NOTE — Progress Notes (Signed)
[facility], [place] 02/23/18  Subjective:  Patient awaiting MRI of the neck. Completed hemodialysis yesterday. Resting comfortably in bed at the moment.  Objective:  Vital signs in last 24 hours:  Temp:  [97.5 F (36.4 C)-98.9 F (37.2 C)] 98.9 F (37.2 C) (08/22 2200) Pulse Rate:  [68-95] 68 (08/23 0614) Resp:  [10-32] 17 (08/22 2200) BP: (149-189)/(73-87) 158/78 (08/23 0614) SpO2:  [94 %-100 %] 99 % (08/22 1619) Weight:  [78.8 kg-79.5 kg] 78.8 kg (08/22 1616)  Weight change:  Filed Weights   02/20/18 1401 02/22/18 1213 02/22/18 1616  Weight: 79.7 kg 79.5 kg 78.8 kg    Intake/Output:    Intake/Output Summary (Last 24 hours) at 02/23/2018 1125 Last data filed at 02/22/2018 1616 Gross per 24 hour  Intake -  Output 1281 ml  Net -1281 ml     Physical Exam: General:  Chronically ill-appearing, no acute distress  HEENT  moist oral mucous membranes  Neck  supple  Pulm/lungs  normal breathing effort, clear to auscultation  CVS/Heart  S1S2 no rub  Abdomen:   Soft, nontender  Extremities:  No peripheral edema, right BKA  Neurologic:  Alert, oriented, able to follow commands  Skin:  No acute rash  Access:  Left arm AVG       Basic Metabolic Panel:  Recent Labs  Lab 02/17/18 0548 02/18/18 0321 02/19/18 0223 02/20/18 0850 02/22/18 1257  NA 137 138 137  --   --   K 3.0* 3.2* 3.3*  --   --   CL 99 99 97*  --   --   CO2 29 30 29   --   --   GLUCOSE 139* 146* 143*  --   --   BUN 41* 23 33*  --   --   CREATININE 5.38* 3.85* 5.02*  --   --   CALCIUM 8.3* 8.5* 8.4*  --   --   PHOS 3.2  --   --  4.2 3.6     CBC: Recent Labs  Lab 02/17/18 0548 02/18/18 0321 02/20/18 1118  WBC 6.2 7.1 6.7  HGB 11.4* 11.0* 10.4*  HCT 35.0* 33.8* 31.9*  MCV 84.8 85.5 84.4  PLT 268 262 282      Lab Results  Component Value Date   HEPBSAG Negative 12/26/2017   HEPBSAB Non Reactive 12/26/2017      Microbiology:  Recent Results (from the  past 240 hour(s))  Blood Culture (routine x 2)     Status: None   Collection Time: 02/16/18 10:35 AM  Result Value Ref Range Status   Specimen Description BLOOD RT [facility]  Final   Special Requests   Final    BOTTLES DRAWN AEROBIC AND ANAEROBIC Blood Culture adequate volume   Culture   Final    NO GROWTH 5 DAYS Performed at [facility], [address] [place], [place] [address]    Report Status 02/21/2018 FINAL  Final  Blood Culture (routine x 2)     Status: Abnormal   Collection Time: 02/16/18 11:46 AM  Result Value Ref Range Status   Specimen Description BLOOD RIGHT HAND  Final   Special Requests   Final    BOTTLES DRAWN AEROBIC AND ANAEROBIC Blood Culture adequate volume Performed at [facility], [address] [place], [place] [address]    Culture  Setup Time   Final    GRAM POSITIVE COCCI ANAEROBIC BOTTLE ONLY CRITICAL RESULT CALLED TO, READ BACK BY AND VERIFIED WITH:  [facility] ZOMPA AT 6433 02/17/18 SDR Performed at [facility], [place] [address], [place], [place] [address]    Culture METHICILLIN RESISTANT STAPHYLOCOCCUS AUREUS (A)  Final   Report Status 02/19/2018 FINAL  Final   Organism ID, Bacteria METHICILLIN RESISTANT STAPHYLOCOCCUS AUREUS  Final      Susceptibility   Methicillin resistant staphylococcus aureus - MIC*    CIPROFLOXACIN >=8 RESISTANT Resistant     ERYTHROMYCIN >=8 RESISTANT Resistant     GENTAMICIN <=0.5 SENSITIVE Sensitive     OXACILLIN >=4 RESISTANT Resistant     TETRACYCLINE <=1 SENSITIVE Sensitive     VANCOMYCIN <=0.5 SENSITIVE Sensitive     TRIMETH/SULFA <=10 SENSITIVE Sensitive     CLINDAMYCIN <=0.25 SENSITIVE Sensitive     RIFAMPIN <=0.5 SENSITIVE Sensitive     Inducible Clindamycin NEGATIVE Sensitive     * METHICILLIN RESISTANT STAPHYLOCOCCUS AUREUS  Blood Culture ID Panel (Reflexed)     Status: Abnormal   Collection Time: 02/16/18 11:46 AM  Result Value Ref Range Status   Enterococcus species NOT DETECTED NOT DETECTED  Final   Listeria monocytogenes NOT DETECTED NOT DETECTED Final   Staphylococcus species DETECTED (A) NOT DETECTED Final    Comment: CRITICAL RESULT CALLED TO, READ BACK BY AND VERIFIED WITH:  HANK ZOMPA AT 0818 02/17/18 SDR    Staphylococcus aureus DETECTED (A) NOT DETECTED Final    Comment: Methicillin (oxacillin)-resistant Staphylococcus aureus (MRSA). MRSA is predictably resistant to beta-lactam antibiotics (except ceftaroline). Preferred therapy is vancomycin unless clinically contraindicated. Patient requires contact precautions if  hospitalized. CRITICAL RESULT CALLED TO, READ BACK BY AND VERIFIED WITH:  HANK ZOMPA AT 0818 02/17/18 SDR    Methicillin resistance DETECTED (A) NOT DETECTED Final    Comment: CRITICAL RESULT CALLED TO, READ BACK BY AND VERIFIED WITH: HANK ZOMPA AT 0818 02/17/18 SDR    Streptococcus species NOT DETECTED NOT DETECTED Final   Streptococcus agalactiae NOT DETECTED NOT DETECTED Final   Streptococcus pneumoniae NOT DETECTED NOT DETECTED Final   Streptococcus pyogenes NOT DETECTED NOT DETECTED Final   Acinetobacter baumannii NOT DETECTED NOT DETECTED Final   Enterobacteriaceae species NOT DETECTED NOT DETECTED Final   Enterobacter cloacae complex NOT DETECTED NOT DETECTED Final   Escherichia coli NOT DETECTED NOT DETECTED Final   Klebsiella oxytoca NOT DETECTED NOT DETECTED Final   Klebsiella pneumoniae NOT DETECTED NOT DETECTED Final   Proteus species NOT DETECTED NOT DETECTED Final   Serratia marcescens NOT DETECTED NOT DETECTED Final   Haemophilus influenzae NOT DETECTED NOT DETECTED Final   Neisseria meningitidis NOT DETECTED NOT DETECTED Final   Pseudomonas aeruginosa NOT DETECTED NOT DETECTED Final   Candida albicans NOT DETECTED NOT DETECTED Final   Candida glabrata NOT DETECTED NOT DETECTED Final   Candida krusei NOT DETECTED NOT DETECTED Final   Candida parapsilosis NOT DETECTED NOT DETECTED Final   Candida tropicalis NOT DETECTED NOT DETECTED  Final    Comment: Performed at [facility], [address] [place], [place] [address]  MRSA PCR Screening     Status: Abnormal   Collection Time: 02/16/18  6:05 PM  Result Value Ref Range Status   MRSA by PCR POSITIVE (A) NEGATIVE Final    Comment:        The GeneXpert MRSA Assay (FDA approved for NASAL specimens only), is one component of a comprehensive MRSA colonization surveillance program. It is not intended to diagnose MRSA infection nor to guide or monitor treatment for MRSA infections. RESULT CALLED TO, READ BACK BY  AND VERIFIED WITH: Noah Delaine AT 0539 02/16/18.PMH Performed at [facility], [address] [place], [place] [address]   CULTURE, BLOOD (ROUTINE X 2) w Reflex to ID Panel     Status: None (Preliminary result)   Collection Time: 02/19/18 12:48 AM  Result Value Ref Range Status   Specimen Description BLOOD RIGHT WRIST  Final   Special Requests   Final    BOTTLES DRAWN AEROBIC AND ANAEROBIC Blood Culture adequate volume   Culture   Final    NO GROWTH 4 DAYS Performed at [facility], [address], [place], [place] [address]    Report Status PENDING  Incomplete  CULTURE, BLOOD (ROUTINE X 2) w Reflex to ID Panel     Status: None (Preliminary result)   Collection Time: 02/19/18 12:48 AM  Result Value Ref Range Status   Specimen Description BLOOD RIGHT HAND  Final   Special Requests   Final    BOTTLES DRAWN AEROBIC AND ANAEROBIC Blood Culture adequate volume   Culture   Final    NO GROWTH 4 DAYS Performed at [facility], [address] [place], [place] [address]    Report Status PENDING  Incomplete  CULTURE, BLOOD (ROUTINE X 2) w Reflex to ID Panel     Status: None (Preliminary result)   Collection Time: 02/20/18  8:59 AM  Result Value Ref Range Status   Specimen Description HEMODIALYSIS FISTULA  Final   Special Requests NONE  Final   Culture   Final    NO GROWTH 3 DAYS Performed at [facility], [address], [place], [place] [address]    Report Status PENDING  Incomplete  CULTURE, BLOOD (ROUTINE X 2) w Reflex to ID Panel     Status: None (Preliminary result)   Collection Time: 02/20/18 10:47 AM  Result Value Ref Range Status   Specimen Description HEMODIALYSIS FISTULA  Final   Special Requests NONE  Final   Culture   Final    NO GROWTH 3 DAYS Performed at [facility], [address] [place],  [address]    Report Status PENDING  Incomplete    Coagulation Studies: No results for input(s): LABPROT, INR in the last 72 hours.  Urinalysis: No results for input(s): COLORURINE, LABSPEC, PHURINE, GLUCOSEU, HGBUR, BILIRUBINUR, KETONESUR, PROTEINUR, UROBILINOGEN, NITRITE, LEUKOCYTESUR in the last 72 hours.  Invalid input(s): APPERANCEUR    Imaging: No results found.   Medications:   . sodium chloride    . vancomycin Stopped (02/22/18 1733)   . aspirin EC  81 mg Oral q1800  . atorvastatin  40 mg Oral q1800  . B-complex with vitamin C  1 tablet Oral q1800  . cholecalciferol  1,000 Units Oral Daily  . clopidogrel  75 mg Oral q1800  . docusate sodium  200 mg Oral BID  . feeding supplement (NEPRO CARB STEADY)  237 mL Oral TID BM  . ferrous sulfate  325 mg Oral q1800  . gabapentin  200 mg Oral TID  . heparin  5,000 Units Subcutaneous Q8H  . insulin aspart  0-15 Units Subcutaneous TID WC  . insulin aspart  0-5 Units Subcutaneous QHS  . lactulose  20 g Oral QHS  . lidocaine-prilocaine  1 application Topical Q T,Th,Sa-HD  . lisinopril  5 mg Oral q1800  . mouth rinse  15 mL Mouth Rinse BID  . metoprolol succinate  12.5 mg Oral BID  . multivitamin with minerals   Oral QHS  .  polyethylene glycol  17 g Oral q1800  . polyvinyl alcohol  1 drop Left Eye QID  . senna  8.6 mg Oral BID  . sertraline  100 mg Oral QHS  . sodium chloride flush  3 mL Intravenous Q12H  . ascorbic acid  500 mg Oral BID   sodium chloride, acetaminophen, benzocaine,  hydrocerin, HYDROmorphone (DILAUDID) injection, nitroGLYCERIN, ondansetron **OR** ondansetron (ZOFRAN) IV, oxyCODONE, sodium chloride flush, traMADol  Assessment/ Plan:  67 y.o. African American male with end stage renal disease onHemodialysis, diabetes mellitus type II, hypertension, peripheral vascular disease, coronary artery disease, hyperlipidemia, congestive heart failure, left BKA, hospitalization on6/23/2019forAMS with respiratory failure, left AV graft angioplasty and stent placement February 07, 2018, hospitalization, gram-negative bacteremia 02/16/2018  TTS [facility] left AVG EDW 80 kg Resident of [facility]   1.  ESRD on HD TTHS  2.  Anemia of CKD, hgb 10.4  3.  SHPTH, Phos 3.6.   4.  MRSA bacteremia.  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  Outpatient follow-up cultures initially showed gram-negative bacteremia BUT final report is negative. He was started on IV Zosyn.  Repeat inpatient cultures are positive for MRSA bacteremia. TEE negative for vegetation.  Cervical discitis noted.   Plan: Patient completed hemodialysis yesterday.  No acute indication for dialysis today.  Awaiting MRI of the T-spine without contrast for further evaluation.  We will continue to monitor hemoglobin as well as serum phosphorus over the course of the hospitalization.  It appears that he will need prolonged IV vancomycin for discitis.    LOS: 6 Monquie Fulgham 8/23/201911:25 AM  [place], [place]  Note: This note was prepared with Dragon dictation. Any transcription errors are unintentional

## 2018-02-23 NOTE — Progress Notes (Signed)
[facility] [place] NAME: Dennis Walker    MR#:  [id number]  DATE OF BIRTH:  [birth year]  SUBJECTIVE:  CHIEF COMPLAINT:   Chief Complaint  Patient presents with  . Weakness   The patient has no neck pain. REVIEW OF SYSTEMS:  CONSTITUTIONAL: No fever, fatigue or weakness.  EYES: No blurred or double vision.  EARS, NOSE, AND THROAT: No tinnitus or ear pain.  RESPIRATORY: No cough, shortness of breath, wheezing or hemoptysis.  CARDIOVASCULAR: No chest pain, orthopnea, edema.  GASTROINTESTINAL: No nausea, vomiting, diarrhea or abdominal pain.  GENITOURINARY: No dysuria, hematuria.  ENDOCRINE: No polyuria, nocturia,  HEMATOLOGY: No anemia, easy bruising or bleeding SKIN: No rash or lesion. MUSCULOSKELETAL: No joint pain or arthritis.  No neck pain. NEUROLOGIC: No tingling, numbness, weakness.  PSYCHIATRY: No anxiety or depression.   ROS  DRUG ALLERGIES:   Allergies  Allergen Reactions  . Cefuroxime Itching  . Shrimp [Shellfish Allergy] Other (See Comments)    Per mar. Unknown reaction   . Sulfa Antibiotics Itching and Rash    VITALS:  Blood pressure (!) 158/78, pulse 68, temperature 98.9 F (37.2 C), temperature source Oral, resp. rate 17, height 5\' 11"  (1.803 m), weight 78.8 kg, SpO2 99 %.  PHYSICAL EXAMINATION:  GENERAL:  67 y.o.-year-old patient lying in the bed with no acute distress.  EYES: Pupils equal, round, reactive to light and accommodation. No scleral icterus. Extraocular muscles intact.  HEENT: Head atraumatic, normocephalic. Oropharynx and nasopharynx clear.  NECK:  Supple, no jugular venous distention. No thyroid enlargement, no tenderness.  LUNGS: Normal breath sounds bilaterally, no wheezing, rales,rhonchi or crepitation. No use of accessory muscles of respiration.  CARDIOVASCULAR: S1, S2 normal. No murmurs, rubs, or gallops.  ABDOMEN: Soft, nontender, nondistended. Bowel sounds present. No organomegaly or mass.   EXTREMITIES: No pedal edema, cyanosis, or clubbing.  Right BKA.  Right hand contracted. NEUROLOGIC: Cranial nerves II through XII are intact. Muscle strength 4/5 in all extremities. Sensation intact. Gait not checked.  PSYCHIATRIC: The patient is alert and oriented x 3.  SKIN: No obvious rash, lesion, or ulcer.   Physical Exam LABORATORY PANEL:   CBC Recent Labs  Lab 02/20/18 1118  WBC 6.7  HGB 10.4*  HCT 31.9*  PLT 282   ------------------------------------------------------------------------------------------------------------------  Chemistries  Recent Labs  Lab 02/19/18 0223  NA 137  K 3.3*  CL 97*  CO2 29  GLUCOSE 143*  BUN 33*  CREATININE 5.02*  CALCIUM 8.4*   ------------------------------------------------------------------------------------------------------------------  Cardiac Enzymes No results for input(s): TROPONINI in the last 168 hours. ------------------------------------------------------------------------------------------------------------------  RADIOLOGY:  Mr Cervical Spine Wo Contrast  Addendum Date: 02/23/2018   ADDENDUM REPORT: 02/23/2018 13:50 ADDENDUM: Critical Value/emergent results were called by telephone at the time of interpretation on 02/23/2018 at 1:37 pm to Dr. Tsosie Billing , who verbally acknowledged these results. Electronically Signed   By: Richardean Sale M.D.   On: 02/23/2018 13:50   Result Date: 02/23/2018 CLINICAL DATA:  Fever of unknown origin with findings on PET-CT suspicious for discitis and osteomyelitis at C6-7. MRSA bacteremia. Diabetes. End-stage renal disease. EXAM: MRI CERVICAL SPINE WITHOUT CONTRAST TECHNIQUE: Multiplanar, multisequence MR imaging of the cervical spine was performed. No intravenous contrast was administered. COMPARISON:  Cervical spine CT 01/04/2018.  PET-CT 02/21/2018. FINDINGS: Alignment: Straightening with approximately 2 mm of retrolisthesis at C6-7. Vertebrae: There is intense T2  hyperintensity within the C5-6 and C6-7 discs consistent with discitis. There is adjacent endplate destruction and  T2 hyperintensity with replacement of the normal T1 marrow signal throughout the C5, C6 and C7 vertebral bodies, consistent with osteomyelitis. Cord: There is a ventral epidural fluid collection measuring up to 4 mm in thickness on sagittal image 8/6. This results in flattening of the cervical cord and narrowing of the AP diameter of the canal to approximately 7 mm. No definite abnormal signal within the cord. Posterior Fossa, vertebral arteries, paraspinal tissues: There is atrophy in the posterior fossa with prominent small vessel ischemic changes in the pons. There are bilateral vertebral artery flow voids. There are inflammatory changes within the paraspinal soft tissues at the level of the discitis/osteomyelitis. There is superior extension of fluid in the retropharyngeal space, extending to the C2 level. Disc levels: No significant disc space findings at C2-3 or C3-4. C4-5: Mild disc bulging and bilateral uncinate spurring. The CSF surrounding the cord is effaced without cord flattening. There is mild foraminal narrowing bilaterally. C5-6: Changes of diskitis and osteomyelitis as described above. There is a ventral epidural fluid collection leading to cord flattening. Moderate foraminal narrowing is present bilaterally. There is underlying mild spondylosis. C6-7: Changes of diskitis and osteomyelitis as described above. There is a ventral epidural fluid collection contributing to cord flattening. Moderate foraminal narrowing is present bilaterally. There is underlying mild spondylosis. C7-T1: No significant findings. IMPRESSION: 1. MR confirms the presence of discitis and osteomyelitis, involving the C5 through C7 vertebral bodies and intervening disc spaces. 2. Associated extensive paraspinal inflammation with a ventral epidural fluid collection most likely representing an abscess. This  contributes to cord compression without abnormal cord signal. In addition, there is a retropharyngeal effusion. Expedited neurosurgical consultation recommended. 3. Relatively mild underlying spondylosis contributes to effacement of the CSF surrounding the cord at the C4-5 level. Electronically Signed: By: Richardean Sale M.D. On: 02/23/2018 13:36    ASSESSMENT AND PLAN:  67 year old African-American male with past medical history significant for end-stage renal disease on Tuesday Thursday Saturday hemodialysis, hypertension, peripheral vascular disease, CAD, CHF presents to hospital secondary to generalized body aches and noted to be bacteremic  *Acute recurrent MRSA bacteremia  Stable history of MRSA bacteremia few months ago. - Status post course of IV vancomycin S/p angioplasty and stent placement in the left upper extremity AV graft 3 weeks ago. F/U vascular surgery for removal of stent a possibility in this case. Repeat blood cultures August 19 negative thus far, repeat blood cultures are negative so far. 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 Continue vancomycin IV.   course of Iv vanco during hemodialysis for 6 weeks per Dr. Delaine Lame.  Cervical spine epidural abscess. MRI without contrast of cervical spine: 1. MR confirms the presence of discitis and osteomyelitis, involving the C5 through C7 vertebral bodies and intervening disc spaces. 2. Associated extensive paraspinal inflammation with a ventral epidural fluid collection most likely representing an abscess. This contributes to cord compression without abnormal cord signal. In addition, there is a retropharyngeal effusion. Expedited neurosurgical consultation recommended. 3. Relatively mild underlying spondylosis contributes to effacement of the CSF surrounding the cord at the C4-5 level. Waiting for neurosurgeon consult.  *Chronic end-stage renal  disease on hemodialysis Currently receiving hemodialysis Nephrology input appreciated Receives hemodialysis on TTS  *Hypertension Stable on lisinopril and metoprolol  Chronic peripheral vascular disease Continue Plavix  *Hypokalemia Correction with dialysis given ESRD  I discussed with Dr. Delaine Lame. I called [place] [facility] at [phone] for consult, waiting for Dr. Ronnald Ramp to  call back.  All the records are reviewed and case discussed with Care Management/Social Workerr. Management plans discussed with the patient, family and they are in agreement.  CODE STATUS: full TOTAL TIME TAKING CARE OF THIS PATIENT: 46 minutes.  POSSIBLE D/C IN ? DAYS, DEPENDING ON CLINICAL CONDITION.   Demetrios Loll M.D on 02/23/2018   Between 7am to 6pm - Pager - [phone]  After 6pm go to [email or web address] - password [email or web address] [facility]  Office  [phone]  CC: Primary care physician; Juluis Pitch, MD  Note: This dictation was prepared with Dragon dictation along with smaller phrase technology. Any transcriptional errors that result from this process are unintentional.

## 2018-02-23 NOTE — Discharge Instructions (Signed)
Vancomycin 1250 mgs Three times a week  Tu, Th and sat during dialysis until 04/16/18  Check cbc/ESR/vanco random level/CMp once a week  Nephrologists to follow his IV antibiotics

## 2018-02-23 NOTE — Progress Notes (Signed)
Notified daughter of discharge per patient request. Daughter requesting to speak with ID related to antibiotics and concerns that they are not working. This message was communicated with Dr. Delaine Lame per text page.

## 2018-02-23 NOTE — Progress Notes (Addendum)
Report called to [place] at [facility]. Linda requesting a script for patient oxycodone stating that the dose and frequency he was on there is different and it was not effective for him. Message sent to dr Bridgett Larsson requesting script.  1700- Per Dr. Bridgett Larsson patient will resume his dose of oxycodone that he was on prior to admission. No script will be written. Message added to packet for nurse at facility making her aware of this.

## 2018-02-23 NOTE — Progress Notes (Signed)
Per MD patient is not stable for D/C today. Tam[facility] admissions coordinator at [facility] is aware of above.   [employer], LCSW [phone]

## 2018-02-23 NOTE — Progress Notes (Signed)
Dr. Chen discussed with neurosurgoen Dr.Jones regarding MRI of cervical spine. No surgical intervention needed For IV antibiotic   Vancomycin 1250 mgs Three times a week  Tu, Th and sat during dialysis until 04/16/18  Check cbc/ESR/vanco random level/CMp once a week  Nephrologists to follow his IV antibiotics   Jay RAvishankar MD 

## 2018-02-23 NOTE — Progress Notes (Signed)
Patient is medically stable for D/C back to [facility] today. Per Tam[facility] admissions coordinator at [facility] patient can come today to room 401-A. RN will call report and arrange EMS for transport. Clinical Education officer, museum (CSW) sent D/C orders to [facility] via [facility]. Patient is aware of above. CSW contacted patient's daughter [name] and made her aware of above. [facility] dialysis coordinator is aware of above. Please reconsult if future social work needs arise. CSW signing off.   [employer], LCSW [phone]

## 2018-02-23 NOTE — Discharge Summary (Addendum)
[facility] NAME: Dennis Walker    MR#:  [id number]  DATE OF BIRTH:  1951/07/04  DATE OF ADMISSION:  02/16/2018   ADMITTING PHYSICIAN: Saundra Shelling, MD  DATE OF DISCHARGE: 02/23/2018 PRIMARY CARE PHYSICIAN: Juluis Pitch, MD   ADMISSION DIAGNOSIS:  Weakness [R53.1] Positive blood culture [R78.81] DISCHARGE DIAGNOSIS:  Active Problems:   ESRD on dialysis [facility])   Bacteremia   MRSA bacteremia   Infection of AV graft for dialysis [facility])  SECONDARY DIAGNOSIS:   Past Medical History:  Diagnosis Date  . Anginal pain ([place])   . Anxiety   . Arthritis   . Asthma   . CHF (congestive heart failure) ([place])   . Coronary artery disease   . Depression   . Diabetes mellitus without complication ([place])   . Dyspnea   . ESRD (end stage renal disease) ([place])    On Tuesday, Thursday and Saturday dialysis  . Heart murmur   . Hyperlipidemia   . Hypertension   . Myocardial infarction ([place])   . Peripheral vascular disease ([place])   . Polio    childhood  . Stroke [facility])    HOSPITAL COURSE:  67 year old African-American male with past medical history significant for end-stage renal disease on Tuesday Thursday Saturday hemodialysis, hypertension, peripheral vascular disease, CAD, CHF presents to hospital secondary to generalized body aches and noted to be bacteremic  *Acute recurrent MRSA bacteremia Stable history of MRSA bacteremia few months ago. - Status post course of IV vancomycin S/pangioplasty and stent placement in the left upper extremity AV graft 3 weeks ago. F/U vascular surgery for removal of stent a possibility in this case. Repeat blood cultures August 19 negative thus far, repeat blood cultures are negative so far. 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 Continue vancomycin IV.   course of Iv vanco during hemodialysis for 6 weeks per Dr.  Delaine Lame.  Cervical spine epidural abscess. MRI without contrast of cervical spine: 1. MR confirms the presence of discitis and osteomyelitis, involving the C5 through C7 vertebral bodies and intervening disc spaces. 2. Associated extensive paraspinal inflammation with a ventral epidural fluid collection most likely representing an abscess. This contributes to cord compression without abnormal cord signal. In addition, there is a retropharyngeal effusion. Expedited neurosurgical consultation recommended. 3. Relatively mild underlying spondylosis contributes to effacement of the CSF surrounding the cord at the C4-5 level. Per, Dr. Ronnald Ramp, neurosurgeon consult, no indication for surgery or procedure at this time.  Medical treatment and follow-up as outpatient.  *Chronic end-stage renal disease on hemodialysis Currently receiving hemodialysis Nephrology input appreciated Receives hemodialysis onTTS  *Hypertension continuelisinopril and metoprolol, increased doses.  Chronic peripheralvascular disease ContinuePlavix  *Hypokalemia Correction withdialysis given ESRD  I discussed with Dr. Delaine Lame, Dr. Holley Raring and Dr. Ronnald Ramp. DISCHARGE CONDITIONS:  Stable, discharge to nursing facility today. CONSULTS OBTAINED:  Treatment Team:  Murlean Iba, MD Tsosie Billing, MD DRUG ALLERGIES:   Allergies  Allergen Reactions  . Cefuroxime Itching  . Shrimp [Shellfish Allergy] Other (See Comments)    Per mar. Unknown reaction   . Sulfa Antibiotics Itching and Rash   DISCHARGE MEDICATIONS:   Allergies as of 02/23/2018      Reactions   Cefuroxime Itching   Shrimp [shellfish Allergy] Other (See Comments)   Per mar. Unknown reaction   Sulfa Antibiotics Itching, Rash      Medication List    STOP taking these medications  oxyCODONE 5 MG immediate release tablet Commonly known as:  Oxy IR/ROXICODONE     TAKE these medications   acetaminophen 500 MG  tablet Commonly known as:  TYLENOL Take 500 mg by mouth every 4 (four) hours as needed for mild pain or moderate pain.   ascorbic acid 500 MG tablet Commonly known as:  VITAMIN C Take 500 mg by mouth 2 (two) times daily. At 1400 and at bedtime   aspirin EC 81 MG tablet Take 81 mg by mouth daily at 6 PM.   atorvastatin 40 MG tablet Commonly known as:  LIPITOR Take 40 mg by mouth daily at 6 PM.   b complex vitamins tablet Take 1 tablet by mouth daily at 6 PM.   clopidogrel 75 MG tablet Commonly known as:  PLAVIX Take 75 mg by mouth daily at 6 PM.   D3-1000 1000 units tablet Generic drug:  Cholecalciferol Take 1,000 Units by mouth daily.   eucerin cream Apply 1 application topically 2 (two) times daily as needed (rash).   feeding supplement (NEPRO CARB STEADY) Liqd Take 237 mLs by mouth 3 (three) times daily between meals.   ferrous sulfate 325 (65 FE) MG tablet Take 325 mg by mouth daily at 6 PM.   gabapentin 100 MG capsule Commonly known as:  NEURONTIN Take 1 capsule (100 mg total) by mouth every evening. What changed:    how much to take  when to take this   ibuprofen 200 MG tablet Commonly known as:  ADVIL,MOTRIN Take 400 mg by mouth 3 (three) times daily.   ibuprofen 400 MG tablet Commonly known as:  ADVIL,MOTRIN Take 1 tablet (400 mg total) by mouth every 6 (six) hours as needed for mild pain or moderate pain.   lactulose 10 GM/15ML solution Commonly known as:  CHRONULAC Take 20 g by mouth at bedtime.   lidocaine-prilocaine cream Commonly known as:  EMLA Apply 1 application topically Every Tuesday,Thursday,and Saturday with dialysis.   lisinopril 10 MG tablet Commonly known as:  PRINIVIL,ZESTRIL Take 1 tablet (10 mg total) by mouth daily at 6 PM. What changed:    medication strength  how much to take   metoprolol succinate 25 MG 24 hr tablet Commonly known as:  TOPROL-XL Take 1 tablet (25 mg total) by mouth 2 (two) times daily. What changed:   how much to take   multivitamin tablet Take 1 tablet by mouth at bedtime.   nitroGLYCERIN 0.4 MG SL tablet Commonly known as:  NITROSTAT Place 0.4 mg under the tongue every 5 (five) minutes as needed for chest pain.   ORAJEL MAXIMUM STRENGTH 20 % Gel Generic drug:  benzocaine Use as directed 1 application in the mouth or throat 4 (four) times daily as needed (pain).   polyethylene glycol packet Commonly known as:  MIRALAX / GLYCOLAX Take 17 g by mouth daily at 6 PM.   senna 8.6 MG Tabs tablet Commonly known as:  SENOKOT Take 8.6 mg by mouth 2 (two) times daily. At 1400 and at 1800   sertraline 100 MG tablet Commonly known as:  ZOLOFT Take 100 mg by mouth at bedtime.   SYSTANE ULTRA PF 0.4-0.3 % Soln Generic drug:  Polyethyl Glyc-Propyl Glyc PF Place 1 drop into the left eye 4 (four) times daily.   traMADol 50 MG tablet Commonly known as:  ULTRAM Take 50 mg by mouth every 6 (six) hours as needed for moderate pain.   vancomycin 1,250 mg in sodium chloride 0.9 % 250 mL  Inject 1,250 mg into the vein Every Tuesday,Thursday,and Saturday with dialysis. Start taking on:  02/24/2018        DISCHARGE INSTRUCTIONS:  See AVS.  If you experience worsening of your admission symptoms, develop shortness of breath, life threatening emergency, suicidal or homicidal thoughts you must seek medical attention immediately by calling 911 or calling your MD immediately  if symptoms less severe.  You Must read complete instructions/literature along with all the possible adverse reactions/side effects for all the Medicines you take and that have been prescribed to you. Take any new Medicines after you have completely understood and accpet all the possible adverse reactions/side effects.   Please note  You were cared for by a hospitalist during your hospital stay. If you have any questions about your discharge medications or the care you received while you were in the hospital after you are  discharged, you can call the unit and asked to speak with the hospitalist on call if the hospitalist that took care of you is not available. Once you are discharged, your primary care physician will handle any further medical issues. Please note that NO REFILLS for any discharge medications will be authorized once you are discharged, as it is imperative that you return to your primary care physician (or establish a relationship with a primary care physician if you do not have one) for your aftercare needs so that they can reassess your need for medications and monitor your lab values.    On the day of Discharge:  VITAL SIGNS:  Blood pressure (!) 158/78, pulse 68, temperature 98.9 F (37.2 C), temperature source Oral, resp. rate 17, height 5\' 11"  (1.803 m), weight 78.8 kg, SpO2 99 %. PHYSICAL EXAMINATION:  GENERAL:  67 y.o.-year-old patient lying in the bed with no acute distress.  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  No scleral icterus. Extraocular muscles intact.  HEENT: Head atraumatic, normocephalic. Oropharynx and nasopharynx clear.  NECK:  Supple, no jugular venous distention. No thyroid enlargement, no tenderness.  LUNGS: Normal breath sounds bilaterally, no wheezing, rales,rhonchi or crepitation. No use of accessory muscles of respiration.  CARDIOVASCULAR: S1, S2 normal. No murmurs, rubs, or gallops.  ABDOMEN: Soft, non-tender, non-distended. Bowel sounds present. No organomegaly or mass.  EXTREMITIES: No pedal edema, cyanosis, or clubbing. Right BKA.  Right hand contracted. NEUROLOGIC: Cranial nerves II through XII are intact. Muscle strength 5/5 in all extremities. Sensation intact. Gait not checked.  PSYCHIATRIC: The patient is alert and oriented x 3.  SKIN: No obvious rash, lesion, or ulcer.  DATA REVIEW:   CBC Recent Labs  Lab 02/20/18 1118  WBC 6.7  HGB 10.4*  HCT 31.9*  PLT 282    Chemistries  Recent Labs  Lab 02/19/18 0223  NA 137  K 3.3*  CL 97*  CO2 29  GLUCOSE 143*   BUN 33*  CREATININE 5.02*  CALCIUM 8.4*     Microbiology Results  Results for orders placed or performed during the hospital encounter of 02/16/18  Blood Culture (routine x 2)     Status: None   Collection Time: 02/16/18 10:35 AM  Result Value Ref Range Status   Specimen Description BLOOD RT [facility]  Final   Special Requests   Final    BOTTLES DRAWN AEROBIC AND ANAEROBIC Blood Culture adequate volume   Culture   Final    NO GROWTH 5 DAYS Performed at [facility], [address] [place],  [address]    Report Status 02/21/2018 FINAL  Final  Blood Culture (routine x 2)     Status: Abnormal   Collection Time: 02/16/18 11:46 AM  Result Value Ref Range Status   Specimen Description BLOOD RIGHT HAND  Final   Special Requests   Final    BOTTLES DRAWN AEROBIC AND ANAEROBIC Blood Culture adequate volume Performed at [facility], [address] [place], [place] [address]    Culture  Setup Time   Final    GRAM POSITIVE COCCI ANAEROBIC BOTTLE ONLY CRITICAL RESULT CALLED TO, READ BACK BY AND VERIFIED WITH: [facility] ZOMPA AT 4709 02/17/18 SDR Performed at [facility], [place] [address], [place], [place] [address]    Culture METHICILLIN RESISTANT STAPHYLOCOCCUS AUREUS (A)  Final   Report Status 02/19/2018 FINAL  Final   Organism ID, Bacteria METHICILLIN RESISTANT STAPHYLOCOCCUS AUREUS  Final      Susceptibility   Methicillin resistant staphylococcus aureus - MIC*    CIPROFLOXACIN >=8 RESISTANT Resistant     ERYTHROMYCIN >=8 RESISTANT Resistant     GENTAMICIN <=0.5 SENSITIVE Sensitive     OXACILLIN >=4 RESISTANT Resistant     TETRACYCLINE <=1 SENSITIVE Sensitive     VANCOMYCIN <=0.5 SENSITIVE Sensitive     TRIMETH/SULFA <=10 SENSITIVE Sensitive     CLINDAMYCIN <=0.25 SENSITIVE Sensitive     RIFAMPIN <=0.5 SENSITIVE Sensitive     Inducible Clindamycin NEGATIVE Sensitive     * METHICILLIN RESISTANT STAPHYLOCOCCUS AUREUS  Blood Culture ID Panel (Reflexed)      Status: Abnormal   Collection Time: 02/16/18 11:46 AM  Result Value Ref Range Status   Enterococcus species NOT DETECTED NOT DETECTED Final   Listeria monocytogenes NOT DETECTED NOT DETECTED Final   Staphylococcus species DETECTED (A) NOT DETECTED Final    Comment: CRITICAL RESULT CALLED TO, READ BACK BY AND VERIFIED WITH:  HANK ZOMPA AT 0818 02/17/18 SDR    Staphylococcus aureus DETECTED (A) NOT DETECTED Final    Comment: Methicillin (oxacillin)-resistant Staphylococcus aureus (MRSA). MRSA is predictably resistant to beta-lactam antibiotics (except ceftaroline). Preferred therapy is vancomycin unless clinically contraindicated. Patient requires contact precautions if  hospitalized. CRITICAL RESULT CALLED TO, READ BACK BY AND VERIFIED WITH:  HANK ZOMPA AT 0818 02/17/18 SDR    Methicillin resistance DETECTED (A) NOT DETECTED Final    Comment: CRITICAL RESULT CALLED TO, READ BACK BY AND VERIFIED WITH: HANK ZOMPA AT 0818 02/17/18 SDR    Streptococcus species NOT DETECTED NOT DETECTED Final   Streptococcus agalactiae NOT DETECTED NOT DETECTED Final   Streptococcus pneumoniae NOT DETECTED NOT DETECTED Final   Streptococcus pyogenes NOT DETECTED NOT DETECTED Final   Acinetobacter baumannii NOT DETECTED NOT DETECTED Final   Enterobacteriaceae species NOT DETECTED NOT DETECTED Final   Enterobacter cloacae complex NOT DETECTED NOT DETECTED Final   Escherichia coli NOT DETECTED NOT DETECTED Final   Klebsiella oxytoca NOT DETECTED NOT DETECTED Final   Klebsiella pneumoniae NOT DETECTED NOT DETECTED Final   Proteus species NOT DETECTED NOT DETECTED Final   Serratia marcescens NOT DETECTED NOT DETECTED Final   Haemophilus influenzae NOT DETECTED NOT DETECTED Final   Neisseria meningitidis NOT DETECTED NOT DETECTED Final   Pseudomonas aeruginosa NOT DETECTED NOT DETECTED Final   Candida albicans NOT DETECTED NOT DETECTED Final   Candida glabrata NOT DETECTED NOT DETECTED Final   Candida krusei NOT  DETECTED NOT DETECTED Final   Candida parapsilosis NOT DETECTED NOT DETECTED Final   Candida tropicalis NOT DETECTED NOT DETECTED Final    Comment: Performed at [facility], [address] [place], [place] [address]  MRSA PCR Screening     Status: Abnormal   Collection Time: 02/16/18  6:05 PM  Result Value Ref Range Status   MRSA by PCR POSITIVE (A) NEGATIVE Final    Comment:        The GeneXpert MRSA Assay (FDA approved for NASAL specimens only), is one component of a comprehensive MRSA colonization surveillance program. It is not intended to diagnose MRSA infection nor to guide or monitor treatment for MRSA infections. RESULT CALLED TO, READ BACK BY AND VERIFIED WITH: Noah Delaine AT 1749 02/16/18.PMH Performed at [facility], [address] [place], [place] [address]   CULTURE, BLOOD (ROUTINE X 2) w Reflex to ID Panel     Status: None (Preliminary result)   Collection Time: 02/19/18 12:48 AM  Result Value Ref Range Status   Specimen Description BLOOD RIGHT WRIST  Final   Special Requests   Final    BOTTLES DRAWN AEROBIC AND ANAEROBIC Blood Culture adequate volume   Culture   Final    NO GROWTH 4 DAYS Performed at [facility], [address] [place], [place] [address]    Report Status PENDING  Incomplete  CULTURE, BLOOD (ROUTINE X 2) w Reflex to ID Panel     Status: None (Preliminary result)   Collection Time: 02/19/18 12:48 AM  Result Value Ref Range Status   Specimen Description BLOOD RIGHT HAND  Final   Special Requests   Final    BOTTLES DRAWN AEROBIC AND ANAEROBIC Blood Culture adequate volume   Culture   Final    NO GROWTH 4 DAYS Performed at [facility], [address] [place], [place] [address]    Report Status PENDING  Incomplete  CULTURE, BLOOD (ROUTINE X 2) w Reflex to ID Panel     Status: None (Preliminary result)   Collection Time: 02/20/18  8:59 AM  Result Value Ref Range Status   Specimen Description  HEMODIALYSIS FISTULA  Final   Special Requests NONE  Final   Culture   Final    NO GROWTH 3 DAYS Performed at [facility], [address], [place], [place] [address]    Report Status PENDING  Incomplete  CULTURE, BLOOD (ROUTINE X 2) w Reflex to ID Panel     Status: None (Preliminary result)   Collection Time: 02/20/18 10:47 AM  Result Value Ref Range Status   Specimen Description HEMODIALYSIS FISTULA  Final   Special Requests NONE  Final   Culture   Final    NO GROWTH 3 DAYS Performed at [facility], [address] [place], [place] [address]    Report Status PENDING  Incomplete    RADIOLOGY:  Mr Cervical Spine Wo Contrast  Addendum Date: 02/23/2018   ADDENDUM REPORT: 02/23/2018 13:50 ADDENDUM: Critical Value/emergent results were called by telephone at the time of interpretation on 02/23/2018 at 1:37 pm to Dr. Tsosie Billing , who verbally acknowledged these results. Electronically Signed   By: Richardean Sale M.D.   On: 02/23/2018 13:50   Result Date: 02/23/2018 CLINICAL DATA:  Fever of unknown origin with findings on PET-CT suspicious for discitis and osteomyelitis at C6-7. MRSA bacteremia. Diabetes. End-stage renal disease. EXAM: MRI CERVICAL SPINE WITHOUT CONTRAST TECHNIQUE: Multiplanar, multisequence MR imaging of the cervical spine was performed. No intravenous contrast was administered. COMPARISON:  Cervical spine CT 01/04/2018.  PET-CT 02/21/2018. FINDINGS: Alignment: Straightening with approximately 2 mm of retrolisthesis at C6-7. Vertebrae: There is intense T2  hyperintensity within the C5-6 and C6-7 discs consistent with discitis. There is adjacent endplate destruction and T2 hyperintensity with replacement of the normal T1 marrow signal throughout the C5, C6 and C7 vertebral bodies, consistent with osteomyelitis. Cord: There is a ventral epidural fluid collection measuring up to 4 mm in thickness on sagittal image 8/6. This results in flattening of the  cervical cord and narrowing of the AP diameter of the canal to approximately 7 mm. No definite abnormal signal within the cord. Posterior Fossa, vertebral arteries, paraspinal tissues: There is atrophy in the posterior fossa with prominent small vessel ischemic changes in the pons. There are bilateral vertebral artery flow voids. There are inflammatory changes within the paraspinal soft tissues at the level of the discitis/osteomyelitis. There is superior extension of fluid in the retropharyngeal space, extending to the C2 level. Disc levels: No significant disc space findings at C2-3 or C3-4. C4-5: Mild disc bulging and bilateral uncinate spurring. The CSF surrounding the cord is effaced without cord flattening. There is mild foraminal narrowing bilaterally. C5-6: Changes of diskitis and osteomyelitis as described above. There is a ventral epidural fluid collection leading to cord flattening. Moderate foraminal narrowing is present bilaterally. There is underlying mild spondylosis. C6-7: Changes of diskitis and osteomyelitis as described above. There is a ventral epidural fluid collection contributing to cord flattening. Moderate foraminal narrowing is present bilaterally. There is underlying mild spondylosis. C7-T1: No significant findings. IMPRESSION: 1. MR confirms the presence of discitis and osteomyelitis, involving the C5 through C7 vertebral bodies and intervening disc spaces. 2. Associated extensive paraspinal inflammation with a ventral epidural fluid collection most likely representing an abscess. This contributes to cord compression without abnormal cord signal. In addition, there is a retropharyngeal effusion. Expedited neurosurgical consultation recommended. 3. Relatively mild underlying spondylosis contributes to effacement of the CSF surrounding the cord at the C4-5 level. Electronically Signed: By: Richardean Sale M.D. On: 02/23/2018 13:36     Management plans discussed with the patient, family  and they are in agreement.  CODE STATUS: Full Code   TOTAL TIME TAKING CARE OF THIS PATIENT: 46 minutes.    Demetrios Loll M.D on 02/23/2018 at 4:08 PM  Between 7am to 6pm - Pager - [phone]  After 6pm go to [email or web address] - Proofreader  [facility]  Office  [phone]  CC: Primary care physician; Juluis Pitch, MD   Note: This dictation was prepared with Dragon dictation along with smaller phrase technology. Any transcriptional errors that result from this process are unintentional.

## 2018-02-23 NOTE — Progress Notes (Addendum)
Pharmacy Antibiotic Note  Dennis Walker is a 67 y.o. male admitted on 02/16/2018 with MRSA bacteremia.  Pharmacy has been consulted for vancomycin dosing.  Plan: Continue vancomycin 1250 mg IV to be given toward the end of each dialysis session. Target goal trough of 15-25 mcg/ml. Next trough to be drawn tomorrow before dialysis session  Height: 5\' 11"  (180.3 cm) Weight: 173 lb 11.6 oz (78.8 kg) IBW/kg (Calculated) : 75.3  Temp (24hrs), Avg:98.2 F (36.8 C), Min:97.5 F (36.4 C), Max:98.9 F (37.2 C)  Recent Labs  Lab 02/16/18 1539 02/17/18 0548 02/18/18 0321 02/19/18 0223 02/20/18 1118 02/22/18 0445  WBC  --  6.2 7.1  --  6.7  --   CREATININE  --  5.38* 3.85* 5.02*  --   --   LATICACIDVEN 0.8  --   --   --   --   --   VANCOTROUGH  --   --   --   --   --  15    Estimated Creatinine Clearance: 15.4 mL/min (A) (by C-G formula based on SCr of 5.02 mg/dL (H)).    Allergies  Allergen Reactions  . Cefuroxime Itching  . Shrimp [Shellfish Allergy] Other (See Comments)    Per mar. Unknown reaction   . Sulfa Antibiotics Itching and Rash    Antimicrobials this admission: Zosyn 8/16 >> 8/18 Vanc 8/17 >>   Dose adjustments this admission: 8/22 Vanc 1 g IV w/ dialysis >> 1250 mg IV w/ dialysis  Microbiology results: 8/16 BCx: 1/2 MRSA 8/19 BCx: NGTD 8/20 BCx (from HD fistula): NGTD 8/16 MRSA PCR: positive  Thank you for allowing pharmacy to be a part of this patient's care.  Tawnya Crook, PharmD Pharmacy Resident  02/23/2018 11:22 AM

## 2018-02-24 LAB — CULTURE, BLOOD (ROUTINE X 2)
Culture: NO GROWTH
Culture: NO GROWTH
SPECIAL REQUESTS: ADEQUATE
Special Requests: ADEQUATE

## 2018-02-25 LAB — CULTURE, BLOOD (ROUTINE X 2)
Culture: NO GROWTH
Culture: NO GROWTH

## 2018-02-28 ENCOUNTER — Encounter (INDEPENDENT_AMBULATORY_CARE_PROVIDER_SITE_OTHER): Payer: Self-pay

## 2018-03-02 ENCOUNTER — Other Ambulatory Visit (INDEPENDENT_AMBULATORY_CARE_PROVIDER_SITE_OTHER): Payer: Self-pay | Admitting: Nurse Practitioner

## 2018-03-12 MED ORDER — CLINDAMYCIN PHOSPHATE 300 MG/50ML IV SOLN: 300.0000 mg | Freq: Once | INTRAVENOUS | Status: DC

## 2018-03-13 ENCOUNTER — Encounter
Admission: RE | Disposition: A | Discharge status: Skilled Nursing Facility | Payer: Self-pay | Source: Ambulatory Visit | Attending: Vascular Surgery

## 2018-03-13 ENCOUNTER — Ambulatory Visit
Admission: RE | Admit: 2018-03-13 | Discharge: 2018-03-13 | Disposition: A | Discharge status: Skilled Nursing Facility | Payer: Medicare Other | Source: Ambulatory Visit | Attending: Vascular Surgery | Admitting: Vascular Surgery

## 2018-03-13 DIAGNOSIS — I132 Hypertensive heart and chronic kidney disease with heart failure and with stage 5 chronic kidney disease, or end stage renal disease: Secondary | ICD-10-CM | POA: Insufficient documentation

## 2018-03-13 DIAGNOSIS — Z8673 Personal history of transient ischemic attack (TIA), and cerebral infarction without residual deficits: Secondary | ICD-10-CM | POA: Insufficient documentation

## 2018-03-13 DIAGNOSIS — I2510 Atherosclerotic heart disease of native coronary artery without angina pectoris: Secondary | ICD-10-CM | POA: Insufficient documentation

## 2018-03-13 DIAGNOSIS — N186 End stage renal disease: Secondary | ICD-10-CM | POA: Diagnosis not present

## 2018-03-13 DIAGNOSIS — Z89511 Acquired absence of right leg below knee: Secondary | ICD-10-CM | POA: Insufficient documentation

## 2018-03-13 DIAGNOSIS — Y832 Surgical operation with anastomosis, bypass or graft as the cause of abnormal reaction of the patient, or of later complication, without mention of misadventure at the time of the procedure: Secondary | ICD-10-CM | POA: Diagnosis not present

## 2018-03-13 DIAGNOSIS — Z8249 Family history of ischemic heart disease and other diseases of the circulatory system: Secondary | ICD-10-CM | POA: Insufficient documentation

## 2018-03-13 DIAGNOSIS — I10 Essential (primary) hypertension: Secondary | ICD-10-CM | POA: Diagnosis not present

## 2018-03-13 DIAGNOSIS — T82590A Other mechanical complication of surgically created arteriovenous fistula, initial encounter: Secondary | ICD-10-CM | POA: Diagnosis present

## 2018-03-13 DIAGNOSIS — Z955 Presence of coronary angioplasty implant and graft: Secondary | ICD-10-CM | POA: Diagnosis not present

## 2018-03-13 DIAGNOSIS — Z91013 Allergy to seafood: Secondary | ICD-10-CM | POA: Diagnosis not present

## 2018-03-13 DIAGNOSIS — T82868A Thrombosis of vascular prosthetic devices, implants and grafts, initial encounter: Secondary | ICD-10-CM | POA: Diagnosis not present

## 2018-03-13 DIAGNOSIS — Z9889 Other specified postprocedural states: Secondary | ICD-10-CM | POA: Diagnosis not present

## 2018-03-13 DIAGNOSIS — Z992 Dependence on renal dialysis: Secondary | ICD-10-CM | POA: Insufficient documentation

## 2018-03-13 DIAGNOSIS — Z881 Allergy status to other antibiotic agents status: Secondary | ICD-10-CM | POA: Diagnosis not present

## 2018-03-13 DIAGNOSIS — E11649 Type 2 diabetes mellitus with hypoglycemia without coma: Secondary | ICD-10-CM | POA: Insufficient documentation

## 2018-03-13 DIAGNOSIS — I509 Heart failure, unspecified: Secondary | ICD-10-CM | POA: Insufficient documentation

## 2018-03-13 DIAGNOSIS — Z882 Allergy status to sulfonamides status: Secondary | ICD-10-CM | POA: Diagnosis not present

## 2018-03-13 DIAGNOSIS — M1990 Unspecified osteoarthritis, unspecified site: Secondary | ICD-10-CM | POA: Insufficient documentation

## 2018-03-13 DIAGNOSIS — I252 Old myocardial infarction: Secondary | ICD-10-CM | POA: Diagnosis not present

## 2018-03-13 DIAGNOSIS — Z8612 Personal history of poliomyelitis: Secondary | ICD-10-CM | POA: Diagnosis not present

## 2018-03-13 DIAGNOSIS — E785 Hyperlipidemia, unspecified: Secondary | ICD-10-CM | POA: Diagnosis not present

## 2018-03-13 DIAGNOSIS — E1151 Type 2 diabetes mellitus with diabetic peripheral angiopathy without gangrene: Secondary | ICD-10-CM | POA: Insufficient documentation

## 2018-03-13 DIAGNOSIS — E1122 Type 2 diabetes mellitus with diabetic chronic kidney disease: Secondary | ICD-10-CM | POA: Insufficient documentation

## 2018-03-13 HISTORY — PX: A/V FISTULAGRAM: CATH118298

## 2018-03-13 LAB — POTASSIUM (ARMC VASCULAR LAB ONLY): POTASSIUM (ARMC VASCULAR LAB): 2.9 — AB (ref 3.5–5.1)

## 2018-03-13 LAB — GLUCOSE, CAPILLARY: Glucose-Capillary: 100 mg/dL — ABNORMAL HIGH (ref 70–99)

## 2018-03-13 SURGERY — A/V FISTULAGRAM
Anesthesia: Moderate Sedation | Laterality: Left

## 2018-03-13 MED ORDER — MIDAZOLAM HCL 2 MG/2ML IJ SOLN
INTRAMUSCULAR | Status: AC
  Filled 2018-03-13: qty 4

## 2018-03-13 MED ORDER — LIDOCAINE HCL (PF) 1 % IJ SOLN
INTRAMUSCULAR | Status: AC
  Filled 2018-03-13: qty 30

## 2018-03-13 MED ORDER — FENTANYL CITRATE (PF) 100 MCG/2ML IJ SOLN
INTRAMUSCULAR | Status: AC
  Filled 2018-03-13: qty 2

## 2018-03-13 MED ORDER — DIPHENHYDRAMINE HCL 50 MG/ML IJ SOLN
INTRAMUSCULAR | Status: AC
  Administered 2018-03-13: 50 mg via INTRAVENOUS
  Filled 2018-03-13: qty 1

## 2018-03-13 MED ORDER — DIPHENHYDRAMINE HCL 50 MG/ML IJ SOLN
50.0000 mg | Freq: Once | INTRAMUSCULAR | Status: AC
  Administered 2018-03-13: 50 mg via INTRAVENOUS

## 2018-03-13 MED ORDER — MIDAZOLAM HCL 2 MG/2ML IJ SOLN
INTRAMUSCULAR | Status: DC | PRN
  Administered 2018-03-13: 1 mg via INTRAVENOUS
  Administered 2018-03-13: 2 mg via INTRAVENOUS

## 2018-03-13 MED ORDER — HEPARIN (PORCINE) IN NACL 1000-0.9 UT/500ML-% IV SOLN
INTRAVENOUS | Status: AC
  Filled 2018-03-13: qty 1000

## 2018-03-13 MED ORDER — CLINDAMYCIN PHOSPHATE 300 MG/50ML IV SOLN
INTRAVENOUS | Status: AC
  Administered 2018-03-13: 15:00:00
  Filled 2018-03-13: qty 50

## 2018-03-13 MED ORDER — FAMOTIDINE 20 MG PO TABS
ORAL_TABLET | ORAL | Status: AC
  Administered 2018-03-13: 40 mg via ORAL
  Filled 2018-03-13: qty 2

## 2018-03-13 MED ORDER — METHYLPREDNISOLONE SODIUM SUCC 125 MG IJ SOLR
INTRAMUSCULAR | Status: AC
  Administered 2018-03-13: 125 mg via INTRAVENOUS
  Filled 2018-03-13: qty 2

## 2018-03-13 MED ORDER — ONDANSETRON HCL 4 MG/2ML IJ SOLN: 4.0000 mg | Freq: Four times a day (QID) | INTRAMUSCULAR | Status: DC | PRN

## 2018-03-13 MED ORDER — SODIUM CHLORIDE 0.9 % IV SOLN
INTRAVENOUS | Status: DC
  Administered 2018-03-13: 13:00:00 via INTRAVENOUS

## 2018-03-13 MED ORDER — IOPAMIDOL (ISOVUE-300) INJECTION 61%
INTRAVENOUS | Status: DC | PRN
  Administered 2018-03-13: 25 mL via INTRAVENOUS

## 2018-03-13 MED ORDER — HEPARIN SODIUM (PORCINE) 1000 UNIT/ML IJ SOLN
INTRAMUSCULAR | Status: AC
  Filled 2018-03-13: qty 1

## 2018-03-13 MED ORDER — HYDROMORPHONE HCL 1 MG/ML IJ SOLN: 1.0000 mg | Freq: Once | INTRAMUSCULAR | Status: DC | PRN

## 2018-03-13 MED ORDER — DIPHENHYDRAMINE HCL 25 MG PO CAPS: 50.0000 mg | ORAL_CAPSULE | Freq: Once | ORAL | Status: AC

## 2018-03-13 MED ORDER — FENTANYL CITRATE (PF) 100 MCG/2ML IJ SOLN
INTRAMUSCULAR | Status: DC | PRN
  Administered 2018-03-13 (×2): 50 ug via INTRAVENOUS

## 2018-03-13 MED ORDER — FAMOTIDINE 20 MG PO TABS
40.0000 mg | ORAL_TABLET | Freq: Once | ORAL | Status: AC
  Administered 2018-03-13: 40 mg via ORAL

## 2018-03-13 MED ORDER — METHYLPREDNISOLONE SODIUM SUCC 125 MG IJ SOLR
125.0000 mg | Freq: Once | INTRAMUSCULAR | Status: AC
  Administered 2018-03-13: 125 mg via INTRAVENOUS

## 2018-03-13 SURGICAL SUPPLY — 9 items
CANNULA 5F STIFF (CANNULA) ×3 IMPLANT
COVER PROBE U/S 5X48 (MISCELLANEOUS) ×3 IMPLANT
DRAPE BRACHIAL (DRAPES) ×3 IMPLANT
NEEDLE ENTRY 21GA 7CM ECHOTIP (NEEDLE) ×3 IMPLANT
PACK ANGIOGRAPHY (CUSTOM PROCEDURE TRAY) ×3 IMPLANT
SET INTRO CAPELLA COAXIAL (SET/KITS/TRAYS/PACK) ×3 IMPLANT
SHEATH BRITE TIP 6FRX5.5 (SHEATH) ×3 IMPLANT
SUT MNCRL AB 4-0 PS2 18 (SUTURE) ×3 IMPLANT
TOWEL OR 17X26 4PK STRL BLUE (TOWEL DISPOSABLE) ×3 IMPLANT

## 2018-03-13 NOTE — Op Note (Signed)
OPERATIVE NOTE   PROCEDURE: 1. Contrast injection left arm brachial axillary dialysis graft  PRE-OPERATIVE DIAGNOSIS: Complication of dialysis access                                                       End Stage Renal Disease  POST-OPERATIVE DIAGNOSIS: same as above   SURGEON: Katha Cabal, M.D.  ANESTHESIA: Conscious sedation was administered under my direct supervision by the interventional radiology RN.  IV Versed plus fentanyl were utilized. Continuous ECG, pulse oximetry and blood pressure was monitored throughout the entire procedure.  Conscious sedation was for a total of 20 minutes.  ESTIMATED BLOOD LOSS: minimal  FINDING(S): 1. Multiple stents all appear intact and in good condition.  Pseudoaneurysm is been successfully excluded.  Midportion of the graft is still accessible for access.  Central venous portion is patent.  Arterial anastomosis is patent.  SPECIMEN(S):  None  CONTRAST: 25 cc  FLUOROSCOPY TIME: 0.5 minutes  INDICATIONS: Dennis Walker is a 67 y.o. male who  presents with malfunctioning left arm AV access.  The patient is scheduled for angiography with possible intervention of the AV access to prevent loss of the permanent access.  The patient is aware the risks include but are not limited to: bleeding, infection, thrombosis of the cannulated access, and possible anaphylactic reaction to the contrast.  The patient acknowledges if the access can not be salvaged a tunneled catheter will be needed and will be placed during this procedure.  The patient is aware of the risks of the procedure and elects to proceed with the angiogram and intervention.  DESCRIPTION: After full informed written consent was obtained, the patient was brought back to the Special Procedure suite and placed supine position.  Appropriate cardiopulmonary monitors were placed.  The left arm was prepped and draped in the standard fashion.  Appropriate timeout is called.   The left brachial  axillary access was cannulated with a micropuncture needle under ultrasound guidence.  Ultrasound was used to evaluate the left brachial axillary access.  It was echolucent and compressible indicating it is patent .  An ultrasound image was acquired for the permanent record.  A micropuncture needle was used to access the left brachial axillary access under direct ultrasound guidance.  The microwire was then advanced under fluoroscopic guidance without difficulty followed by the micro-sheath.  The J-wire was then advanced and a 6 Fr sheath inserted.  Hand injections were completed to image the access from the arterial anastomosis through the entire access.  The central venous structures were also imaged by hand injections.  Based on the images,  Multiple stents all appear intact and in good condition.  Pseudoaneurysm is been successfully excluded.  Midportion of the graft is still accessible for access.  Central venous portion is patent.  Arterial anastomosis is patent..    A 4-0 Monocryl purse-string suture was sewn around the sheath.  The sheath was removed and light pressure was applied.  A sterile bandage was applied to the puncture site.    COMPLICATIONS: None  CONDITION: Stable  Katha Cabal, M.D [facility] Office: [phone]  03/13/2018 3:36 PM
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
## 2018-03-13 NOTE — Progress Notes (Signed)
Report called to [place] at [facility] resources. Unable to locate aide at this time.

## 2018-03-14 ENCOUNTER — Encounter: Payer: Self-pay | Admitting: Vascular Surgery

## 2018-03-26 ENCOUNTER — Encounter (INDEPENDENT_AMBULATORY_CARE_PROVIDER_SITE_OTHER): Payer: Medicare Other

## 2018-03-26 ENCOUNTER — Ambulatory Visit (INDEPENDENT_AMBULATORY_CARE_PROVIDER_SITE_OTHER): Payer: Medicare Other | Admitting: Vascular Surgery

## 2018-03-26 ENCOUNTER — Encounter (INDEPENDENT_AMBULATORY_CARE_PROVIDER_SITE_OTHER): Payer: Self-pay | Admitting: Vascular Surgery

## 2018-03-26 VITALS — BP 142/71 | HR 68 | Resp 16

## 2018-03-26 DIAGNOSIS — E1122 Type 2 diabetes mellitus with diabetic chronic kidney disease: Secondary | ICD-10-CM | POA: Diagnosis not present

## 2018-03-26 DIAGNOSIS — E1152 Type 2 diabetes mellitus with diabetic peripheral angiopathy with gangrene: Secondary | ICD-10-CM | POA: Diagnosis not present

## 2018-03-26 DIAGNOSIS — R7881 Bacteremia: Secondary | ICD-10-CM

## 2018-03-26 DIAGNOSIS — T829XXS Unspecified complication of cardiac and vascular prosthetic device, implant and graft, sequela: Secondary | ICD-10-CM

## 2018-03-26 DIAGNOSIS — Z992 Dependence on renal dialysis: Secondary | ICD-10-CM

## 2018-03-26 DIAGNOSIS — T82868S Thrombosis of vascular prosthetic devices, implants and grafts, sequela: Secondary | ICD-10-CM

## 2018-03-26 DIAGNOSIS — I70213 Atherosclerosis of native arteries of extremities with intermittent claudication, bilateral legs: Secondary | ICD-10-CM | POA: Diagnosis not present

## 2018-03-26 DIAGNOSIS — I120 Hypertensive chronic kidney disease with stage 5 chronic kidney disease or end stage renal disease: Secondary | ICD-10-CM | POA: Diagnosis not present

## 2018-03-26 DIAGNOSIS — N186 End stage renal disease: Secondary | ICD-10-CM

## 2018-03-26 DIAGNOSIS — Z794 Long term (current) use of insulin: Secondary | ICD-10-CM

## 2018-03-26 DIAGNOSIS — I10 Essential (primary) hypertension: Secondary | ICD-10-CM
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
## 2018-03-28 ENCOUNTER — Other Ambulatory Visit
Admission: RE | Admit: 2018-03-28 | Discharge: 2018-03-28 | Disposition: A | Discharge status: Home / Self Care | Payer: Medicare Other | Source: Ambulatory Visit | Attending: Nephrology | Admitting: Nephrology

## 2018-03-28 DIAGNOSIS — N186 End stage renal disease: Secondary | ICD-10-CM | POA: Diagnosis present

## 2018-03-28 LAB — COMPREHENSIVE METABOLIC PANEL
ALBUMIN: 3 g/dL — AB (ref 3.5–5.0)
ALK PHOS: 109 U/L (ref 38–126)
ALT: 22 U/L (ref 0–44)
ANION GAP: 12 (ref 5–15)
AST: 31 U/L (ref 15–41)
BILIRUBIN TOTAL: 0.6 mg/dL (ref 0.3–1.2)
BUN: 54 mg/dL — ABNORMAL HIGH (ref 8–23)
CALCIUM: 8.1 mg/dL — AB (ref 8.9–10.3)
CO2: 27 mmol/L (ref 22–32)
Chloride: 99 mmol/L (ref 98–111)
Creatinine, Ser: 7.47 mg/dL — ABNORMAL HIGH (ref 0.61–1.24)
GFR calc Af Amer: 8 mL/min — ABNORMAL LOW (ref >60)
GFR, EST NON AFRICAN AMERICAN: 7 mL/min — AB (ref >60)
GLUCOSE: 200 mg/dL — AB (ref 70–99)
POTASSIUM: 4 mmol/L (ref 3.5–5.1)
Sodium: 138 mmol/L (ref 135–145)
TOTAL PROTEIN: 7.1 g/dL (ref 6.5–8.1)

## 2018-03-28 LAB — SEDIMENTATION RATE: Sed Rate: 53 mm/hr — ABNORMAL HIGH (ref 0–20)

## 2018-04-03 ENCOUNTER — Other Ambulatory Visit
Admission: RE | Admit: 2018-04-03 | Discharge: 2018-04-03 | Disposition: A | Discharge status: Home / Self Care | Payer: Medicare Other | Source: Ambulatory Visit | Attending: Nephrology | Admitting: Nephrology

## 2018-04-03 DIAGNOSIS — N186 End stage renal disease: Secondary | ICD-10-CM | POA: Insufficient documentation

## 2018-04-03 LAB — COMPREHENSIVE METABOLIC PANEL
ALBUMIN: 3.3 g/dL — AB (ref 3.5–5.0)
ALK PHOS: 121 U/L (ref 38–126)
ALT: 21 U/L (ref 0–44)
AST: 27 U/L (ref 15–41)
Anion gap: 13 (ref 5–15)
BILIRUBIN TOTAL: 0.9 mg/dL (ref 0.3–1.2)
BUN: 42 mg/dL — ABNORMAL HIGH (ref 8–23)
CO2: 27 mmol/L (ref 22–32)
CREATININE: 6.55 mg/dL — AB (ref 0.61–1.24)
Calcium: 8.6 mg/dL — ABNORMAL LOW (ref 8.9–10.3)
Chloride: 97 mmol/L — ABNORMAL LOW (ref 98–111)
GFR calc Af Amer: 9 mL/min — ABNORMAL LOW (ref >60)
GFR calc non Af Amer: 8 mL/min — ABNORMAL LOW (ref >60)
GLUCOSE: 163 mg/dL — AB (ref 70–99)
Potassium: 3.4 mmol/L — ABNORMAL LOW (ref 3.5–5.1)
SODIUM: 137 mmol/L (ref 135–145)
Total Protein: 6.9 g/dL (ref 6.5–8.1)

## 2018-04-03 LAB — SEDIMENTATION RATE: Sed Rate: 43 mm/hr — ABNORMAL HIGH (ref 0–20)

## 2018-04-05 ENCOUNTER — Encounter: Payer: Self-pay | Admitting: Infectious Diseases

## 2018-04-05 ENCOUNTER — Ambulatory Visit: Payer: Medicare Other | Attending: Infectious Diseases | Admitting: Infectious Diseases

## 2018-04-05 VITALS — BP 175/82 | HR 84 | Temp 98.1°F

## 2018-04-05 DIAGNOSIS — I132 Hypertensive heart and chronic kidney disease with heart failure and with stage 5 chronic kidney disease, or end stage renal disease: Secondary | ICD-10-CM | POA: Insufficient documentation

## 2018-04-05 DIAGNOSIS — Z881 Allergy status to other antibiotic agents status: Secondary | ICD-10-CM | POA: Diagnosis not present

## 2018-04-05 DIAGNOSIS — Z992 Dependence on renal dialysis: Secondary | ICD-10-CM

## 2018-04-05 DIAGNOSIS — M25512 Pain in left shoulder: Secondary | ICD-10-CM

## 2018-04-05 DIAGNOSIS — M4642 Discitis, unspecified, cervical region: Secondary | ICD-10-CM | POA: Diagnosis not present

## 2018-04-05 DIAGNOSIS — B9562 Methicillin resistant Staphylococcus aureus infection as the cause of diseases classified elsewhere: Secondary | ICD-10-CM

## 2018-04-05 DIAGNOSIS — R7881 Bacteremia: Secondary | ICD-10-CM | POA: Diagnosis not present

## 2018-04-05 DIAGNOSIS — Z79899 Other long term (current) drug therapy: Secondary | ICD-10-CM | POA: Insufficient documentation

## 2018-04-05 DIAGNOSIS — Z955 Presence of coronary angioplasty implant and graft: Secondary | ICD-10-CM

## 2018-04-05 DIAGNOSIS — H5461 Unqualified visual loss, right eye, normal vision left eye: Secondary | ICD-10-CM

## 2018-04-05 DIAGNOSIS — Z89511 Acquired absence of right leg below knee: Secondary | ICD-10-CM

## 2018-04-05 DIAGNOSIS — E1122 Type 2 diabetes mellitus with diabetic chronic kidney disease: Secondary | ICD-10-CM | POA: Diagnosis present

## 2018-04-05 DIAGNOSIS — I509 Heart failure, unspecified: Secondary | ICD-10-CM | POA: Insufficient documentation

## 2018-04-05 DIAGNOSIS — N186 End stage renal disease: Secondary | ICD-10-CM | POA: Diagnosis not present

## 2018-04-05 DIAGNOSIS — I120 Hypertensive chronic kidney disease with stage 5 chronic kidney disease or end stage renal disease: Secondary | ICD-10-CM

## 2018-04-05 DIAGNOSIS — J45909 Unspecified asthma, uncomplicated: Secondary | ICD-10-CM | POA: Insufficient documentation

## 2018-04-05 DIAGNOSIS — E1151 Type 2 diabetes mellitus with diabetic peripheral angiopathy without gangrene: Secondary | ICD-10-CM | POA: Diagnosis not present

## 2018-04-05 DIAGNOSIS — Z8673 Personal history of transient ischemic attack (TIA), and cerebral infarction without residual deficits: Secondary | ICD-10-CM | POA: Diagnosis not present

## 2018-04-05 DIAGNOSIS — Z8614 Personal history of Methicillin resistant Staphylococcus aureus infection: Secondary | ICD-10-CM | POA: Insufficient documentation

## 2018-04-05 DIAGNOSIS — I252 Old myocardial infarction: Secondary | ICD-10-CM | POA: Insufficient documentation

## 2018-04-05 DIAGNOSIS — E785 Hyperlipidemia, unspecified: Secondary | ICD-10-CM

## 2018-04-05 DIAGNOSIS — I2510 Atherosclerotic heart disease of native coronary artery without angina pectoris: Secondary | ICD-10-CM

## 2018-04-05 DIAGNOSIS — Z7982 Long term (current) use of aspirin: Secondary | ICD-10-CM | POA: Diagnosis not present

## 2018-04-05 DIAGNOSIS — Z882 Allergy status to sulfonamides status: Secondary | ICD-10-CM

## 2018-04-05 DIAGNOSIS — M25511 Pain in right shoulder: Secondary | ICD-10-CM

## 2018-04-05 DIAGNOSIS — M4622 Osteomyelitis of vertebra, cervical region: Secondary | ICD-10-CM | POA: Diagnosis not present

## 2018-04-05 DIAGNOSIS — F329 Major depressive disorder, single episode, unspecified: Secondary | ICD-10-CM | POA: Insufficient documentation

## 2018-04-05 DIAGNOSIS — G8194 Hemiplegia, unspecified affecting left nondominant side: Secondary | ICD-10-CM

## 2018-04-05 DIAGNOSIS — Z91013 Allergy to seafood: Secondary | ICD-10-CM

## 2018-04-05 DIAGNOSIS — Z9713 Presence of artificial right leg (complete) (partial): Secondary | ICD-10-CM
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
## 2018-04-05 NOTE — Patient Instructions (Addendum)
You are here for follow up after discharge from hospital.  You are being treated for MRSA bacteremia and cervical discitis.(c6/c7) With vancomycin given during dilaysis days. You have been on Vancomycin since 02/16/18. Will treat until 05/02/18  as this is recurrence (as you had MRSA in June 2019 as well.) Continue weekly labs ( ESR/ CRP/vanco random/CMP) After IV you may need PO Doxy '100mg'$  BID for 7month [facility] to look for MRSA. Take a probiotic every day . Will follow up in 2 months

## 2018-04-08 ENCOUNTER — Other Ambulatory Visit
Admission: RE | Admit: 2018-04-08 | Discharge: 2018-04-08 | Disposition: A | Discharge status: Home / Self Care | Payer: Medicare Other | Source: Ambulatory Visit | Attending: Family Medicine | Admitting: Family Medicine

## 2018-04-08 LAB — MRSA PCR SCREENING: MRSA by PCR: POSITIVE — AB

## 2018-04-10 ENCOUNTER — Other Ambulatory Visit
Admission: RE | Admit: 2018-04-10 | Discharge: 2018-04-10 | Disposition: A | Discharge status: Home / Self Care | Payer: Medicare Other | Source: Ambulatory Visit | Attending: Nephrology | Admitting: Nephrology

## 2018-04-10 DIAGNOSIS — N186 End stage renal disease: Secondary | ICD-10-CM | POA: Insufficient documentation

## 2018-04-10 LAB — COMPREHENSIVE METABOLIC PANEL
ALT: 16 U/L (ref 0–44)
AST: 20 U/L (ref 15–41)
Albumin: 3.3 g/dL — ABNORMAL LOW (ref 3.5–5.0)
Alkaline Phosphatase: 102 U/L (ref 38–126)
Anion gap: 10 (ref 5–15)
BUN: 17 mg/dL (ref 8–23)
CHLORIDE: 100 mmol/L (ref 98–111)
CO2: 27 mmol/L (ref 22–32)
CREATININE: 3.18 mg/dL — AB (ref 0.61–1.24)
Calcium: 8.2 mg/dL — ABNORMAL LOW (ref 8.9–10.3)
GFR, EST AFRICAN AMERICAN: 22 mL/min — AB (ref >60)
GFR, EST NON AFRICAN AMERICAN: 19 mL/min — AB (ref >60)
Glucose, Bld: 121 mg/dL — ABNORMAL HIGH (ref 70–99)
Potassium: 2.9 mmol/L — ABNORMAL LOW (ref 3.5–5.1)
Sodium: 137 mmol/L (ref 135–145)
Total Bilirubin: 0.6 mg/dL (ref 0.3–1.2)
Total Protein: 7.3 g/dL (ref 6.5–8.1)

## 2018-04-10 LAB — SEDIMENTATION RATE: SED RATE: 27 mm/h — AB (ref 0–20)

## 2018-04-10 LAB — C-REACTIVE PROTEIN: CRP: 1.2 mg/dL — AB (ref <1.0)

## 2018-04-17 ENCOUNTER — Other Ambulatory Visit
Admission: RE | Admit: 2018-04-17 | Discharge: 2018-04-17 | Disposition: A | Discharge status: Home / Self Care | Payer: Medicare Other | Source: Ambulatory Visit | Attending: Nephrology | Admitting: Nephrology

## 2018-04-17 DIAGNOSIS — N186 End stage renal disease: Secondary | ICD-10-CM | POA: Insufficient documentation

## 2018-04-17 LAB — C-REACTIVE PROTEIN: CRP: <0.8 mg/dL (ref <1.0)

## 2018-04-17 LAB — COMPREHENSIVE METABOLIC PANEL
ALK PHOS: 103 U/L (ref 38–126)
ALT: 16 U/L (ref 0–44)
ANION GAP: 12 (ref 5–15)
AST: 18 U/L (ref 15–41)
Albumin: 3.8 g/dL (ref 3.5–5.0)
BILIRUBIN TOTAL: 0.6 mg/dL (ref 0.3–1.2)
BUN: 28 mg/dL — ABNORMAL HIGH (ref 8–23)
CALCIUM: 8.5 mg/dL — AB (ref 8.9–10.3)
CO2: 25 mmol/L (ref 22–32)
CREATININE: 4.6 mg/dL — AB (ref 0.61–1.24)
Chloride: 101 mmol/L (ref 98–111)
GFR calc Af Amer: 14 mL/min — ABNORMAL LOW (ref >60)
GFR, EST NON AFRICAN AMERICAN: 12 mL/min — AB (ref >60)
Glucose, Bld: 160 mg/dL — ABNORMAL HIGH (ref 70–99)
Potassium: 3.1 mmol/L — ABNORMAL LOW (ref 3.5–5.1)
Sodium: 138 mmol/L (ref 135–145)
TOTAL PROTEIN: 7.6 g/dL (ref 6.5–8.1)

## 2018-04-17 LAB — SEDIMENTATION RATE: Sed Rate: 21 mm/hr — ABNORMAL HIGH (ref 0–20)

## 2018-04-24 ENCOUNTER — Other Ambulatory Visit
Admission: RE | Admit: 2018-04-24 | Discharge: 2018-04-24 | Disposition: A | Discharge status: Home / Self Care | Payer: Medicare Other | Source: Ambulatory Visit | Attending: Nephrology | Admitting: Nephrology

## 2018-04-24 DIAGNOSIS — N186 End stage renal disease: Secondary | ICD-10-CM | POA: Insufficient documentation

## 2018-05-01 ENCOUNTER — Other Ambulatory Visit
Admission: RE | Admit: 2018-05-01 | Discharge: 2018-05-01 | Disposition: A | Discharge status: Home / Self Care | Payer: Medicare Other | Source: Ambulatory Visit | Attending: Nephrology | Admitting: Nephrology

## 2018-05-01 DIAGNOSIS — N186 End stage renal disease: Secondary | ICD-10-CM | POA: Insufficient documentation

## 2018-05-01 LAB — COMPREHENSIVE METABOLIC PANEL
ALT: 12 U/L (ref 0–44)
AST: 15 U/L (ref 15–41)
Albumin: 3.5 g/dL (ref 3.5–5.0)
Alkaline Phosphatase: 84 U/L (ref 38–126)
Anion gap: 14 (ref 5–15)
BILIRUBIN TOTAL: 0.8 mg/dL (ref 0.3–1.2)
BUN: 44 mg/dL — AB (ref 8–23)
CO2: 27 mmol/L (ref 22–32)
CREATININE: 6.83 mg/dL — AB (ref 0.61–1.24)
Calcium: 8.7 mg/dL — ABNORMAL LOW (ref 8.9–10.3)
Chloride: 97 mmol/L — ABNORMAL LOW (ref 98–111)
GFR calc non Af Amer: 7 mL/min — ABNORMAL LOW (ref >60)
GFR, EST AFRICAN AMERICAN: 9 mL/min — AB (ref >60)
Glucose, Bld: 74 mg/dL (ref 70–99)
POTASSIUM: 4.6 mmol/L (ref 3.5–5.1)
Sodium: 138 mmol/L (ref 135–145)
TOTAL PROTEIN: 7.6 g/dL (ref 6.5–8.1)

## 2018-05-01 LAB — SEDIMENTATION RATE: SED RATE: 26 mm/h — AB (ref 0–20)

## 2018-05-01 LAB — C-REACTIVE PROTEIN

## 2018-06-04 ENCOUNTER — Ambulatory Visit: Payer: Medicare Other | Attending: Infectious Diseases | Admitting: Infectious Diseases

## 2018-06-04 ENCOUNTER — Encounter: Payer: Self-pay | Admitting: Infectious Diseases

## 2018-06-04 VITALS — BP 167/95 | HR 69 | Temp 98.2°F

## 2018-06-04 DIAGNOSIS — M4622 Osteomyelitis of vertebra, cervical region: Secondary | ICD-10-CM

## 2018-06-04 DIAGNOSIS — B9562 Methicillin resistant Staphylococcus aureus infection as the cause of diseases classified elsewhere: Secondary | ICD-10-CM

## 2018-06-04 DIAGNOSIS — Z91013 Allergy to seafood: Secondary | ICD-10-CM

## 2018-06-04 DIAGNOSIS — I2510 Atherosclerotic heart disease of native coronary artery without angina pectoris: Secondary | ICD-10-CM

## 2018-06-04 DIAGNOSIS — E1151 Type 2 diabetes mellitus with diabetic peripheral angiopathy without gangrene: Secondary | ICD-10-CM

## 2018-06-04 DIAGNOSIS — Z992 Dependence on renal dialysis: Secondary | ICD-10-CM

## 2018-06-04 DIAGNOSIS — Z881 Allergy status to other antibiotic agents status: Secondary | ICD-10-CM

## 2018-06-04 DIAGNOSIS — M4642 Discitis, unspecified, cervical region: Secondary | ICD-10-CM | POA: Diagnosis not present

## 2018-06-04 DIAGNOSIS — E1122 Type 2 diabetes mellitus with diabetic chronic kidney disease: Secondary | ICD-10-CM

## 2018-06-04 DIAGNOSIS — Z9713 Presence of artificial right leg (complete) (partial): Secondary | ICD-10-CM

## 2018-06-04 DIAGNOSIS — R7881 Bacteremia: Secondary | ICD-10-CM

## 2018-06-04 DIAGNOSIS — E785 Hyperlipidemia, unspecified: Secondary | ICD-10-CM

## 2018-06-04 DIAGNOSIS — N186 End stage renal disease: Secondary | ICD-10-CM

## 2018-06-04 DIAGNOSIS — H5461 Unqualified visual loss, right eye, normal vision left eye: Secondary | ICD-10-CM

## 2018-06-04 DIAGNOSIS — Z89511 Acquired absence of right leg below knee: Secondary | ICD-10-CM

## 2018-06-04 DIAGNOSIS — G8194 Hemiplegia, unspecified affecting left nondominant side: Secondary | ICD-10-CM

## 2018-06-04 DIAGNOSIS — I120 Hypertensive chronic kidney disease with stage 5 chronic kidney disease or end stage renal disease: Secondary | ICD-10-CM
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
## 2018-06-05 ENCOUNTER — Ambulatory Visit: Payer: Medicare Other | Admitting: Infectious Diseases

## 2018-06-20 ENCOUNTER — Ambulatory Visit (INDEPENDENT_AMBULATORY_CARE_PROVIDER_SITE_OTHER): Payer: Medicare Other | Admitting: Nurse Practitioner

## 2018-06-20 ENCOUNTER — Ambulatory Visit (INDEPENDENT_AMBULATORY_CARE_PROVIDER_SITE_OTHER): Payer: Medicare Other

## 2018-06-20 ENCOUNTER — Encounter (INDEPENDENT_AMBULATORY_CARE_PROVIDER_SITE_OTHER): Payer: Self-pay | Admitting: Nurse Practitioner

## 2018-06-20 ENCOUNTER — Encounter (INDEPENDENT_AMBULATORY_CARE_PROVIDER_SITE_OTHER): Payer: Medicare Other

## 2018-06-20 VITALS — BP 157/76 | HR 71

## 2018-06-20 DIAGNOSIS — I120 Hypertensive chronic kidney disease with stage 5 chronic kidney disease or end stage renal disease: Secondary | ICD-10-CM

## 2018-06-20 DIAGNOSIS — N186 End stage renal disease: Secondary | ICD-10-CM

## 2018-06-20 DIAGNOSIS — T829XXD Unspecified complication of cardiac and vascular prosthetic device, implant and graft, subsequent encounter: Secondary | ICD-10-CM | POA: Diagnosis not present

## 2018-06-20 DIAGNOSIS — Z992 Dependence on renal dialysis: Secondary | ICD-10-CM

## 2018-06-20 DIAGNOSIS — I10 Essential (primary) hypertension: Secondary | ICD-10-CM

## 2018-06-20 DIAGNOSIS — E7800 Pure hypercholesterolemia, unspecified: Secondary | ICD-10-CM

## 2018-06-20 DIAGNOSIS — T829XXS Unspecified complication of cardiac and vascular prosthetic device, implant and graft, sequela: Secondary | ICD-10-CM
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
## 2018-06-25 ENCOUNTER — Encounter (INDEPENDENT_AMBULATORY_CARE_PROVIDER_SITE_OTHER): Payer: Self-pay | Admitting: Nurse Practitioner

## 2018-07-09 ENCOUNTER — Ambulatory Visit (INDEPENDENT_AMBULATORY_CARE_PROVIDER_SITE_OTHER): Payer: Medicare Other | Admitting: Nurse Practitioner

## 2018-07-09 ENCOUNTER — Encounter (INDEPENDENT_AMBULATORY_CARE_PROVIDER_SITE_OTHER): Payer: Medicare Other

## 2018-07-09 ENCOUNTER — Other Ambulatory Visit (INDEPENDENT_AMBULATORY_CARE_PROVIDER_SITE_OTHER): Payer: Medicare Other

## 2018-07-25 ENCOUNTER — Ambulatory Visit (INDEPENDENT_AMBULATORY_CARE_PROVIDER_SITE_OTHER): Payer: Medicare Other

## 2018-07-25 ENCOUNTER — Encounter (INDEPENDENT_AMBULATORY_CARE_PROVIDER_SITE_OTHER): Payer: Self-pay | Admitting: Nurse Practitioner

## 2018-07-25 ENCOUNTER — Ambulatory Visit (INDEPENDENT_AMBULATORY_CARE_PROVIDER_SITE_OTHER): Payer: Medicare Other | Admitting: Nurse Practitioner

## 2018-07-25 VITALS — BP 160/81 | HR 62 | Resp 16

## 2018-07-25 DIAGNOSIS — Z992 Dependence on renal dialysis: Secondary | ICD-10-CM | POA: Diagnosis not present

## 2018-07-25 DIAGNOSIS — N186 End stage renal disease: Secondary | ICD-10-CM

## 2018-07-25 DIAGNOSIS — E7800 Pure hypercholesterolemia, unspecified: Secondary | ICD-10-CM | POA: Diagnosis not present

## 2018-07-25 DIAGNOSIS — I10 Essential (primary) hypertension: Secondary | ICD-10-CM

## 2018-07-25 DIAGNOSIS — I120 Hypertensive chronic kidney disease with stage 5 chronic kidney disease or end stage renal disease: Secondary | ICD-10-CM | POA: Diagnosis not present

## 2018-07-26 ENCOUNTER — Encounter (INDEPENDENT_AMBULATORY_CARE_PROVIDER_SITE_OTHER): Payer: Self-pay | Admitting: Nurse Practitioner
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
## 2018-08-17 ENCOUNTER — Encounter: Payer: Self-pay | Admitting: Internal Medicine

## 2018-08-17 ENCOUNTER — Ambulatory Visit (INDEPENDENT_AMBULATORY_CARE_PROVIDER_SITE_OTHER): Payer: Medicare Other | Admitting: Internal Medicine

## 2018-08-17 VITALS — BP 152/74 | HR 64 | Wt 170.5 lb

## 2018-08-17 DIAGNOSIS — I10 Essential (primary) hypertension: Secondary | ICD-10-CM

## 2018-08-17 DIAGNOSIS — I2510 Atherosclerotic heart disease of native coronary artery without angina pectoris: Secondary | ICD-10-CM

## 2018-08-17 DIAGNOSIS — E785 Hyperlipidemia, unspecified: Secondary | ICD-10-CM | POA: Diagnosis not present

## 2018-08-17 DIAGNOSIS — N186 End stage renal disease: Secondary | ICD-10-CM

## 2018-08-17 DIAGNOSIS — Z01810 Encounter for preprocedural cardiovascular examination: Secondary | ICD-10-CM

## 2018-08-17 NOTE — Patient Instructions (Addendum)
Medication Instructions:  Your physician recommends that you continue on your current medications as directed. Please refer to the Current Medication list given to you today.  If you need a refill on your cardiac medications before your next appointment, please call your pharmacy.   Lab work: none If you have labs (blood work) drawn today and your tests are completely normal, you will receive your results only by: Marland Kitchen MyChart Message (if you have MyChart) OR . A paper copy in the mail If you have any lab test that is abnormal or we need to change your treatment, we will call you to review the results.  Testing/Procedures: Your physician has requested that you have a lexiscan myoview. For further information please visit [email or web address]. Please follow instruction sheet, as given.  [place]  Your caregiver has ordered a Stress Test with nuclear imaging. The purpose of this test is to evaluate the blood supply to your heart muscle. This procedure is referred to as a "Non-Invasive Stress Test." This is because other than having an IV started in your vein, nothing is inserted or "invades" your body. Cardiac stress tests are done to find areas of poor blood flow to the heart by determining the extent of coronary artery disease (CAD). Some patients exercise on a treadmill, which naturally increases the blood flow to your heart, while others who are  unable to walk on a treadmill due to physical limitations have a pharmacologic/chemical stress agent called Lexiscan . This medicine will mimic walking on a treadmill by temporarily increasing your coronary blood flow.   Please note: these test may take anywhere between 2-4 hours to complete  PLEASE REPORT TO [place] AT THE FIRST DESK WILL DIRECT YOU WHERE TO GO  Date of Procedure:_____________________________________  Arrival Time for Procedure:______________________________   PLEASE NOTIFY THE OFFICE AT  LEAST 24 HOURS IN ADVANCE IF YOU ARE UNABLE TO KEEP YOUR APPOINTMENT.  [phone] AND  PLEASE NOTIFY NUCLEAR MEDICINE AT [facility] AT LEAST 24 HOURS IN ADVANCE IF YOU ARE UNABLE TO KEEP YOUR APPOINTMENT. [phone]  How to prepare for your Myoview test:  1. Do not eat or drink after midnight 2. No caffeine for 24 hours prior to test 3. No smoking 24 hours prior to test. 4. Your medication may be taken with water.  If your doctor stopped a medication because of this test, do not take that medication. 5. Ladies, please do not wear dresses.  Skirts or pants are appropriate. Please wear a short sleeve shirt. 6. No perfume, cologne or lotion. 7. Wear comfortable walking shoes.     Follow-Up: At [facility], you and your health needs are our priority.  As part of our continuing mission to provide you with exceptional heart care, we have created designated Provider Care Teams.  These Care Teams include your primary Cardiologist (physician) and Advanced Practice Providers (APPs -  Physician Assistants and Nurse Practitioners) who all work together to provide you with the care you need, when you need it. You will need a follow up appointment in 12 months.  Please call our office 2 months in advance to schedule this appointment.  You may see DR Harrell Gave END or one of the following Advanced Practice Providers on your designated Care Team:   Murray Hodgkins, NP Christell Faith, PA-C . Marrianne Mood, PA-C     Cardiac Nuclear Scan A cardiac nuclear scan is a test that measures blood flow to the heart when a person is  resting and when he or she is exercising. The test looks for problems such as:  Not enough blood reaching a portion of the heart.  The heart muscle not working normally. You may need this test if:  You have heart disease.  You have had abnormal lab results.  You have had heart surgery or a balloon procedure to open up blocked arteries (angioplasty).  You have chest  pain.  You have shortness of breath. In this test, a radioactive dye (tracer) is injected into your bloodstream. After the tracer has traveled to your heart, an imaging device is used to measure how much of the tracer is absorbed by or distributed to various areas of your heart. This procedure is usually done at a hospital and takes 2-4 hours. Tell a health care provider about:  Any allergies you have.  All medicines you are taking, including vitamins, herbs, eye drops, creams, and over-the-counter medicines.  Any problems you or family members have had with anesthetic medicines.  Any blood disorders you have.  Any surgeries you have had.  Any medical conditions you have.  Whether you are pregnant or may be pregnant. What are the risks? Generally, this is a safe procedure. However, problems may occur, including:  Serious chest pain and heart attack. This is only a risk if the stress portion of the test is done.  Rapid heartbeat.  Sensation of warmth in your chest. This usually passes quickly.  Allergic reaction to the tracer. What happens before the procedure?  Ask your health care provider about changing or stopping your regular medicines. This is especially important if you are taking diabetes medicines or blood thinners.  Follow instructions from your health care provider about eating or drinking restrictions.  Remove your jewelry on the day of the procedure. What happens during the procedure?  An IV will be inserted into one of your veins.  Your health care provider will inject a small amount of radioactive tracer through the IV.  You will wait for 20-40 minutes while the tracer travels through your bloodstream.  Your heart activity will be monitored with an electrocardiogram (ECG).  You will lie down on an exam table.  Images of your heart will be taken for about 15-20 minutes.  You may also have a stress test. For this test, one of the following may be  done: ? You will exercise on a treadmill or stationary bike. While you exercise, your heart's activity will be monitored with an ECG, and your blood pressure will be checked. ? You will be given medicines that will increase blood flow to parts of your heart. This is done if you are unable to exercise.  When blood flow to your heart has peaked, a tracer will again be injected through the IV.  After 20-40 minutes, you will get back on the exam table and have more images taken of your heart.  Depending on the type of tracer used, scans may need to be repeated 3-4 hours later.  Your IV line will be removed when the procedure is over. The procedure may vary among health care providers and hospitals. What happens after the procedure?  Unless your health care provider tells you otherwise, you may return to your normal schedule, including diet, activities, and medicines.  Unless your health care provider tells you otherwise, you may increase your fluid intake. This will help to flush the contrast dye from your body. Drink enough fluid to keep your urine pale yellow.  Ask your  health care provider, or the department that is doing the test: ? When will my results be ready? ? How will I get my results? Summary  A cardiac nuclear scan measures the blood flow to the heart when a person is resting and when he or she is exercising.  Tell your health care provider if you are pregnant.  Before the procedure, ask your health care provider about changing or stopping your regular medicines. This is especially important if you are taking diabetes medicines or blood thinners.  After the procedure, unless your health care provider tells you otherwise, increase your fluid intake. This will help flush the contrast dye from your body.  After the procedure, unless your health care provider tells you otherwise, you may return to your normal schedule, including diet, activities, and medicines. This information is  not intended to replace advice given to you by your health care provider. Make sure you discuss any questions you have with your health care provider. Document Released: 07/15/2004 Document Revised: 12/04/2017 Document Reviewed: 12/04/2017 Elsevier Interactive Patient Education  2019 [employer].
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
## 2018-08-18 ENCOUNTER — Encounter: Payer: Self-pay | Admitting: Internal Medicine

## 2018-08-18 DIAGNOSIS — I2510 Atherosclerotic heart disease of native coronary artery without angina pectoris: Secondary | ICD-10-CM | POA: Insufficient documentation

## 2018-08-18 DIAGNOSIS — Z01810 Encounter for preprocedural cardiovascular examination: Secondary | ICD-10-CM | POA: Insufficient documentation

## 2018-08-18 DIAGNOSIS — E785 Hyperlipidemia, unspecified: Secondary | ICD-10-CM | POA: Insufficient documentation

## 2018-08-24 ENCOUNTER — Encounter
Admission: RE | Admit: 2018-08-24 | Discharge: 2018-08-24 | Disposition: A | Discharge status: Home / Self Care | Payer: Medicare Other | Source: Ambulatory Visit | Attending: Internal Medicine | Admitting: Internal Medicine

## 2018-08-24 DIAGNOSIS — N186 End stage renal disease: Secondary | ICD-10-CM | POA: Insufficient documentation

## 2018-08-24 DIAGNOSIS — Z01810 Encounter for preprocedural cardiovascular examination: Secondary | ICD-10-CM | POA: Insufficient documentation

## 2018-08-24 DIAGNOSIS — I2510 Atherosclerotic heart disease of native coronary artery without angina pectoris: Secondary | ICD-10-CM | POA: Diagnosis present

## 2018-08-24 MED ORDER — REGADENOSON 0.4 MG/5ML IV SOLN
0.4000 mg | Freq: Once | INTRAVENOUS | Status: AC
  Administered 2018-08-24: 0.4 mg via INTRAVENOUS

## 2018-08-24 MED ORDER — TECHNETIUM TC 99M TETROFOSMIN IV KIT
10.0000 | PACK | Freq: Once | INTRAVENOUS | Status: AC | PRN
  Administered 2018-08-24: 10.71 via INTRAVENOUS

## 2018-08-24 MED ORDER — TECHNETIUM TC 99M TETROFOSMIN IV KIT
30.5000 | PACK | Freq: Once | INTRAVENOUS | Status: AC | PRN
  Administered 2018-08-24: 30.5 via INTRAVENOUS

## 2018-08-25 LAB — NM MYOCAR MULTI W/SPECT W/WALL MOTION / EF
CHL CUP NUCLEAR SRS: 12
CHL CUP NUCLEAR SSS: 12
CHL CUP RESTING HR STRESS: 63 {beats}/min
CSEPHR: 46 %
LV dias vol: 104 mL (ref 62–150)
LV sys vol: 62 mL
Peak HR: 71 {beats}/min
SDS: 1
TID: 1.32

## 2018-08-28 ENCOUNTER — Telehealth: Payer: Self-pay | Admitting: *Deleted

## 2018-08-28 NOTE — Telephone Encounter (Signed)
Patient's number listed was busy.  Called next number and it was [facility]. Results given to patient's nurse as well as faxed to number provided by nurse [phone].

## 2018-08-28 NOTE — Telephone Encounter (Signed)
-----   Message from Nelva Bush, MD sent at 08/28/2018 12:17 PM EST ----- Please let Mr. Kratzke know that his stress test is abnormal, showing evidence of a prior heart attack but no ischemia (new blockage).  Overall, this study is similar to the report from prior stress test at [facility] in 2017.  Though he is at increased risk for perioperative cardiac complications due to his numerous comorbidities, I do not believe that additional testing will mitigate his perioperative cardiac risk.  He can proceed as directed by vascular surgery.

## 2018-09-03 ENCOUNTER — Encounter (INDEPENDENT_AMBULATORY_CARE_PROVIDER_SITE_OTHER): Payer: Self-pay

## 2018-09-11 ENCOUNTER — Other Ambulatory Visit (INDEPENDENT_AMBULATORY_CARE_PROVIDER_SITE_OTHER): Payer: Self-pay | Admitting: Nurse Practitioner

## 2018-09-12 ENCOUNTER — Encounter: Payer: Self-pay | Admitting: Anesthesiology

## 2018-09-12 ENCOUNTER — Other Ambulatory Visit: Payer: Self-pay

## 2018-09-12 ENCOUNTER — Encounter
Admission: RE | Admit: 2018-09-12 | Discharge: 2018-09-12 | Disposition: A | Discharge status: Home / Self Care | Payer: Medicare Other | Source: Ambulatory Visit | Attending: Vascular Surgery | Admitting: Vascular Surgery

## 2018-09-12 DIAGNOSIS — R9431 Abnormal electrocardiogram [ECG] [EKG]: Secondary | ICD-10-CM | POA: Insufficient documentation

## 2018-09-12 DIAGNOSIS — Z01810 Encounter for preprocedural cardiovascular examination: Secondary | ICD-10-CM

## 2018-09-12 DIAGNOSIS — Z01818 Encounter for other preprocedural examination: Secondary | ICD-10-CM | POA: Diagnosis present

## 2018-09-12 LAB — CBC WITH DIFFERENTIAL/PLATELET
Abs Immature Granulocytes: 0.03 10*3/uL (ref 0.00–0.07)
Basophils Absolute: 0.1 10*3/uL (ref 0.0–0.1)
Basophils Relative: 1 %
Eosinophils Absolute: 0.3 10*3/uL (ref 0.0–0.5)
Eosinophils Relative: 5 %
HEMATOCRIT: 40.5 % (ref 39.0–52.0)
Hemoglobin: 12.5 g/dL — ABNORMAL LOW (ref 13.0–17.0)
Immature Granulocytes: 0 %
LYMPHS ABS: 1.3 10*3/uL (ref 0.7–4.0)
Lymphocytes Relative: 19 %
MCH: 27.8 pg (ref 26.0–34.0)
MCHC: 30.9 g/dL (ref 30.0–36.0)
MCV: 90.2 fL (ref 80.0–100.0)
Monocytes Absolute: 0.6 10*3/uL (ref 0.1–1.0)
Monocytes Relative: 9 %
NRBC: 0 % (ref 0.0–0.2)
Neutro Abs: 4.6 10*3/uL (ref 1.7–7.7)
Neutrophils Relative %: 66 %
Platelets: 223 10*3/uL (ref 150–400)
RBC: 4.49 MIL/uL (ref 4.22–5.81)
RDW: 15.3 % (ref 11.5–15.5)
WBC: 6.9 10*3/uL (ref 4.0–10.5)

## 2018-09-12 LAB — PROTIME-INR
INR: 1.2 (ref 0.8–1.2)
Prothrombin Time: 14.7 seconds (ref 11.4–15.2)

## 2018-09-12 LAB — TYPE AND SCREEN
ABO/RH(D): O POS
Antibody Screen: NEGATIVE

## 2018-09-12 LAB — BASIC METABOLIC PANEL
Anion gap: 13 (ref 5–15)
BUN: 32 mg/dL — ABNORMAL HIGH (ref 8–23)
CO2: 26 mmol/L (ref 22–32)
CREATININE: 5.92 mg/dL — AB (ref 0.61–1.24)
Calcium: 8.6 mg/dL — ABNORMAL LOW (ref 8.9–10.3)
Chloride: 99 mmol/L (ref 98–111)
GFR calc Af Amer: 11 mL/min — ABNORMAL LOW (ref >60)
GFR calc non Af Amer: 9 mL/min — ABNORMAL LOW (ref >60)
Glucose, Bld: 127 mg/dL — ABNORMAL HIGH (ref 70–99)
Potassium: 4.1 mmol/L (ref 3.5–5.1)
Sodium: 138 mmol/L (ref 135–145)

## 2018-09-12 LAB — APTT: aPTT: 39 seconds — ABNORMAL HIGH (ref 24–36)

## 2018-09-12 NOTE — Pre-Procedure Instructions (Signed)
LABS FAXED TO DR [facility]

## 2018-09-12 NOTE — Patient Instructions (Signed)
Your procedure is scheduled on: Wednesday 09/19/18.  Report to DAY SURGERY DEPARTMENT LOCATED ON 2ND FLOOR MEDICAL MALL ENTRANCE. To find out your arrival time please call [phone] between 1PM - 3PM on Tuesday 09/18/18.   Remember: Instructions that are not followed completely may result in serious medical risk, up to and including death, or upon the discretion of your surgeon and anesthesiologist your surgery may need to be rescheduled.      _X__ 1. Do not eat food after midnight the night before your procedure.                 No gum chewing or hard candies. You may drink clear liquids up to 2 hours                 before you are scheduled to arrive for your surgery- DO NOT drink clear                 liquids within 2 hours of the start of your surgery.                 Clear Liquids include:  water, apple juice without pulp, clear carbohydrate                 drink such as Clearfast or Gatorade, Black Coffee or Tea (Do not add                 milk or creamer to coffee or tea).    __X__2.  On the morning of surgery brush your teeth with toothpaste and water, you may rinse your mouth with mouthwash if you wish.  Do not swallow any toothpaste or mouthwash.       _X__ 4.  Do Not Smoke or use e-cigarettes For 24 Hours Prior to Your Surgery.                 Do not use any chewable tobacco products for at least 6 hours prior to                 surgery.   __X__6.  Notify your doctor if there is any change in your medical condition      (cold, fever, infections).   Do not wear jewelry, make-up, hairpins, clips or nail polish. Do not wear lotions, powders, or perfumes.  Do not shave 48 hours prior to surgery. Men may shave face and neck. Do not bring valuables to the hospital.      [facility] is not responsible for any belongings or valuables.    Contacts, dentures/partials or body piercings may not be worn into surgery. Bring a case for your contacts, glasses or hearing aids,  a denture cup will be supplied.     Patients discharged the day of surgery will not be allowed to drive home.     Please read over the following fact sheets that you were given:   MRSA Information    __X__ Take these medicines the morning of surgery with A SIP OF WATER:     1. doxycycline (VIBRA-TABS) 100 MG tablet    2. acetaminophen (TYLENOL) 500 MG tablet if needed  3. oxyCODONE (OXY IR/ROXICODONE) 5 MG immediate release tablet if needed  4. traMADol (ULTRAM) 50 MG tablet if needed    __X__ Use SAGE wipes as directed    __X__ Stop Blood Thinners: Plavix 5 days prior to your procedure. Last dose Friday 09/14/18. Continue to take Aspirin. But do NOT take  on the morning of your procedure. This is according to Dr. Nino Parsley office instructions.   __X__ Stop Anti-inflammatories 7 days before surgery (TODAY) such as Advil, Ibuprofen, Motrin, BC or Goodies Powder, Naprosyn, Naproxen, Aleve, Aspirin, Meloxicam. May take Tylenol, Tramadol, and/or Oxycodone if needed for pain or discomfort.    __X__ Do not begin taking any new herbal supplements before your procedure.

## 2018-09-13 NOTE — Pre-Procedure Instructions (Signed)
EKG reviewed with Dr. Rosey Bath and okay to proceed with procedure on 09/19/18. Patient was also cleared by cardiologist on 2/14 with subsequent myocardial perfusion stress test clearance on 2/25.

## 2018-09-17 ENCOUNTER — Other Ambulatory Visit
Admission: RE | Admit: 2018-09-17 | Discharge: 2018-09-17 | Disposition: A | Discharge status: Home / Self Care | Payer: Medicare Other | Source: Ambulatory Visit | Attending: Family Medicine | Admitting: Family Medicine

## 2018-09-17 DIAGNOSIS — R0981 Nasal congestion: Secondary | ICD-10-CM | POA: Diagnosis present

## 2018-09-17 DIAGNOSIS — R05 Cough: Secondary | ICD-10-CM | POA: Diagnosis not present

## 2018-09-17 LAB — INFLUENZA PANEL BY PCR (TYPE A & B)
Influenza A By PCR: NEGATIVE
Influenza B By PCR: NEGATIVE

## 2018-09-18 ENCOUNTER — Telehealth (INDEPENDENT_AMBULATORY_CARE_PROVIDER_SITE_OTHER): Payer: Self-pay

## 2018-09-18 NOTE — Telephone Encounter (Signed)
  I left a message for the daughter Theodoro Grist to return a call and the patient has been rescheduled to 10/10/2018. The patient will need to be seen and released by his PCP and be seen in our office again before his surgery.      I received amessage from Lissa at Pre-admit as follows.  [09/18/2018 11:12 AM]  [facility] through [facility] skype:   Aileen Pilot.  I just received a call from [facility], Dr. Delana Meyer has a patient on for tomorrow, Harout, Scheurich, MRN [id number], he was newly diagnosed with pneumonia yesterday and his daughter is requesting that he be rescheduled.  I have not notified the OR.               [09/18/2018 11:14 AM]  Devona Konig:   Okay I will get him rescheduled, thanks for letting me know and I will call the OR as well.

## 2018-09-24 ENCOUNTER — Ambulatory Visit (INDEPENDENT_AMBULATORY_CARE_PROVIDER_SITE_OTHER): Payer: Medicare Other | Admitting: Vascular Surgery

## 2018-09-24 ENCOUNTER — Encounter (INDEPENDENT_AMBULATORY_CARE_PROVIDER_SITE_OTHER): Payer: Medicare Other

## 2018-09-27 ENCOUNTER — Telehealth (INDEPENDENT_AMBULATORY_CARE_PROVIDER_SITE_OTHER): Payer: Self-pay

## 2018-09-27 NOTE — Telephone Encounter (Signed)
Spoke with the patient's daughter regarding his surgery and the need to cancel this at this time. Patient will be scheduled to come back in to be seen by Dr. Delana Meyer in mid May per Dr. Delana Meyer. Patient resides at [facility].

## 2018-10-09 ENCOUNTER — Other Ambulatory Visit: Payer: Self-pay

## 2018-10-09 ENCOUNTER — Ambulatory Visit: Payer: Medicare Other | Attending: Infectious Diseases | Admitting: Infectious Diseases

## 2018-10-09 DIAGNOSIS — B9562 Methicillin resistant Staphylococcus aureus infection as the cause of diseases classified elsewhere: Secondary | ICD-10-CM

## 2018-10-09 DIAGNOSIS — M4642 Discitis, unspecified, cervical region: Secondary | ICD-10-CM

## 2018-10-09 DIAGNOSIS — R7881 Bacteremia: Secondary | ICD-10-CM

## 2018-10-09 NOTE — Progress Notes (Signed)
  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  He is doing well- he has no neck pain at all- Month 8 of antibiotic He has no fever or chills or rash or diarrhea. His only c/o is headache after he has dialysis   Medical history MRSA bacteremia in June 2019 treated with 6 weeks of Iv vanco , [facility] positive for MRSA . 8/16 BC was positive for MRSA, 8/19 and 8/20 BC were negative He was complaining of neck pain and MRI was done on 8/23which showedthepresence of discitis and osteomyelitis, involving the C5 through C7 vertebral bodies and intervening disc spaces. The films were evaluated by neurosurgery and they did not think he needed any surgical intervention. TEE was negative.As the MIC of vancomycin was 0.5 [facility] be given during dialysis days.  He had a fistulogram done on 9/10 by Dr. SDelana Meyerand showed "Multiple stents all appear intact and in good condition.Pseudoaneurysm is been successfully excluded. Midportion of the graft is still accessible for access. Central venous portion is patent. 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   Treated with 10 weeks of IV vanco and now on Doxy 1076mBID. Will continue for a total of 12 months- will do labs ESR, CRP, CBC  ESRD on dialysis. No further  graft issues,  Graft on the left arm had given some problems in the past  and he has multiple stents . Last saw vascular Dr.Schneir on 9/23 and plan was to do ultrasound every 6 monhts  Discussed the management with the patient and his nurse

## 2018-10-10 ENCOUNTER — Encounter: Admission: RE | Payer: Self-pay | Source: Home / Self Care

## 2018-10-10 ENCOUNTER — Ambulatory Visit: Admission: RE | Admit: 2018-10-10 | Payer: Medicare Other | Source: Home / Self Care | Admitting: Vascular Surgery

## 2018-10-10 SURGERY — INSERTION OF ARTERIOVENOUS (AV) GORE-TEX GRAFT ARM
Anesthesia: General | Laterality: Right

## 2018-10-24 ENCOUNTER — Ambulatory Visit: Payer: Medicare Other | Admitting: Internal Medicine

## 2018-11-09 ENCOUNTER — Other Ambulatory Visit (INDEPENDENT_AMBULATORY_CARE_PROVIDER_SITE_OTHER): Payer: Self-pay | Admitting: Vascular Surgery

## 2018-11-09 DIAGNOSIS — N186 End stage renal disease: Secondary | ICD-10-CM

## 2018-11-12 ENCOUNTER — Ambulatory Visit (INDEPENDENT_AMBULATORY_CARE_PROVIDER_SITE_OTHER): Payer: Medicare Other

## 2018-11-12 ENCOUNTER — Encounter (INDEPENDENT_AMBULATORY_CARE_PROVIDER_SITE_OTHER): Payer: Self-pay | Admitting: Vascular Surgery

## 2018-11-12 ENCOUNTER — Other Ambulatory Visit: Payer: Self-pay

## 2018-11-12 ENCOUNTER — Ambulatory Visit (INDEPENDENT_AMBULATORY_CARE_PROVIDER_SITE_OTHER): Payer: Medicare Other | Admitting: Vascular Surgery

## 2018-11-12 VITALS — BP 176/89 | HR 67 | Resp 12 | Ht 69.5 in | Wt 170.0 lb

## 2018-11-12 DIAGNOSIS — N186 End stage renal disease: Secondary | ICD-10-CM

## 2018-11-12 DIAGNOSIS — I7025 Atherosclerosis of native arteries of other extremities with ulceration: Secondary | ICD-10-CM | POA: Insufficient documentation

## 2018-11-12 DIAGNOSIS — Z992 Dependence on renal dialysis: Secondary | ICD-10-CM | POA: Diagnosis not present

## 2018-11-12 DIAGNOSIS — I2510 Atherosclerotic heart disease of native coronary artery without angina pectoris: Secondary | ICD-10-CM | POA: Diagnosis not present

## 2018-11-12 DIAGNOSIS — T82898A Other specified complication of vascular prosthetic devices, implants and grafts, initial encounter: Secondary | ICD-10-CM | POA: Diagnosis not present

## 2018-11-12 DIAGNOSIS — I120 Hypertensive chronic kidney disease with stage 5 chronic kidney disease or end stage renal disease: Secondary | ICD-10-CM

## 2018-11-12 DIAGNOSIS — Z79899 Other long term (current) drug therapy: Secondary | ICD-10-CM

## 2018-11-12 DIAGNOSIS — I70245 Atherosclerosis of native arteries of left leg with ulceration of other part of foot: Secondary | ICD-10-CM

## 2018-11-12 DIAGNOSIS — I871 Compression of vein: Secondary | ICD-10-CM | POA: Diagnosis not present

## 2018-11-12 DIAGNOSIS — L97529 Non-pressure chronic ulcer of other part of left foot with unspecified severity: Secondary | ICD-10-CM | POA: Diagnosis not present

## 2018-11-12 DIAGNOSIS — I10 Essential (primary) hypertension: Secondary | ICD-10-CM

## 2018-11-12 DIAGNOSIS — T829XXS Unspecified complication of cardiac and vascular prosthetic device, implant and graft, sequela: Secondary | ICD-10-CM
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
## 2018-11-27 ENCOUNTER — Telehealth (INDEPENDENT_AMBULATORY_CARE_PROVIDER_SITE_OTHER): Payer: Self-pay

## 2018-11-27 ENCOUNTER — Encounter (INDEPENDENT_AMBULATORY_CARE_PROVIDER_SITE_OTHER): Payer: Self-pay

## 2018-11-27 NOTE — Telephone Encounter (Signed)
Patient has been scheduled for a leg angio and arm venogram on 56/08/2018 with a 8:00 am arrival time to do Covid testing. All pre-procedure instructions were faxed to [facility] Attn: [place] for this patient.

## 2018-12-02 ENCOUNTER — Other Ambulatory Visit (INDEPENDENT_AMBULATORY_CARE_PROVIDER_SITE_OTHER): Payer: Self-pay | Admitting: Nurse Practitioner

## 2018-12-04 ENCOUNTER — Encounter
Admission: RE | Disposition: A | Discharge status: Home / Self Care | Payer: Self-pay | Source: Home / Self Care | Attending: Vascular Surgery

## 2018-12-04 ENCOUNTER — Other Ambulatory Visit
Admission: RE | Admit: 2018-12-04 | Discharge: 2018-12-04 | Disposition: A | Discharge status: Home / Self Care | Payer: Medicare Other | Source: Ambulatory Visit | Attending: Vascular Surgery | Admitting: Vascular Surgery

## 2018-12-04 ENCOUNTER — Ambulatory Visit
Admission: RE | Admit: 2018-12-04 | Discharge: 2018-12-04 | Disposition: A | Discharge status: Home / Self Care | Payer: Medicare Other | Attending: Vascular Surgery | Admitting: Vascular Surgery

## 2018-12-04 ENCOUNTER — Other Ambulatory Visit: Payer: Self-pay

## 2018-12-04 DIAGNOSIS — I25119 Atherosclerotic heart disease of native coronary artery with unspecified angina pectoris: Secondary | ICD-10-CM | POA: Insufficient documentation

## 2018-12-04 DIAGNOSIS — I132 Hypertensive heart and chronic kidney disease with heart failure and with stage 5 chronic kidney disease, or end stage renal disease: Secondary | ICD-10-CM | POA: Diagnosis not present

## 2018-12-04 DIAGNOSIS — I2510 Atherosclerotic heart disease of native coronary artery without angina pectoris: Secondary | ICD-10-CM | POA: Insufficient documentation

## 2018-12-04 DIAGNOSIS — Z8673 Personal history of transient ischemic attack (TIA), and cerebral infarction without residual deficits: Secondary | ICD-10-CM | POA: Diagnosis not present

## 2018-12-04 DIAGNOSIS — F419 Anxiety disorder, unspecified: Secondary | ICD-10-CM | POA: Diagnosis not present

## 2018-12-04 DIAGNOSIS — T829XXA Unspecified complication of cardiac and vascular prosthetic device, implant and graft, initial encounter: Secondary | ICD-10-CM | POA: Diagnosis not present

## 2018-12-04 DIAGNOSIS — L97529 Non-pressure chronic ulcer of other part of left foot with unspecified severity: Secondary | ICD-10-CM | POA: Insufficient documentation

## 2018-12-04 DIAGNOSIS — E785 Hyperlipidemia, unspecified: Secondary | ICD-10-CM | POA: Diagnosis not present

## 2018-12-04 DIAGNOSIS — I70299 Other atherosclerosis of native arteries of extremities, unspecified extremity: Secondary | ICD-10-CM

## 2018-12-04 DIAGNOSIS — I509 Heart failure, unspecified: Secondary | ICD-10-CM | POA: Diagnosis not present

## 2018-12-04 DIAGNOSIS — L97929 Non-pressure chronic ulcer of unspecified part of left lower leg with unspecified severity: Secondary | ICD-10-CM | POA: Diagnosis not present

## 2018-12-04 DIAGNOSIS — I871 Compression of vein: Secondary | ICD-10-CM

## 2018-12-04 DIAGNOSIS — I70249 Atherosclerosis of native arteries of left leg with ulceration of unspecified site: Secondary | ICD-10-CM | POA: Diagnosis not present

## 2018-12-04 DIAGNOSIS — I252 Old myocardial infarction: Secondary | ICD-10-CM | POA: Diagnosis not present

## 2018-12-04 DIAGNOSIS — Z1159 Encounter for screening for other viral diseases: Secondary | ICD-10-CM | POA: Insufficient documentation

## 2018-12-04 DIAGNOSIS — Z992 Dependence on renal dialysis: Secondary | ICD-10-CM

## 2018-12-04 DIAGNOSIS — J45909 Unspecified asthma, uncomplicated: Secondary | ICD-10-CM | POA: Insufficient documentation

## 2018-12-04 DIAGNOSIS — N186 End stage renal disease: Secondary | ICD-10-CM | POA: Insufficient documentation

## 2018-12-04 DIAGNOSIS — I70203 Unspecified atherosclerosis of native arteries of extremities, bilateral legs: Secondary | ICD-10-CM | POA: Diagnosis present

## 2018-12-04 DIAGNOSIS — F329 Major depressive disorder, single episode, unspecified: Secondary | ICD-10-CM | POA: Diagnosis not present

## 2018-12-04 HISTORY — PX: UPPER EXTREMITY VENOGRAPHY: CATH118272

## 2018-12-04 HISTORY — PX: LOWER EXTREMITY ANGIOGRAPHY: CATH118251

## 2018-12-04 LAB — GLUCOSE, CAPILLARY
Glucose-Capillary: 94 mg/dL (ref 70–99)
Glucose-Capillary: 130 mg/dL — ABNORMAL HIGH (ref 70–99)

## 2018-12-04 LAB — POTASSIUM (ARMC VASCULAR LAB ONLY): Potassium (ARMC vascular lab): 4 (ref 3.5–5.1)

## 2018-12-04 LAB — SARS CORONAVIRUS 2 BY RT PCR (HOSPITAL ORDER, PERFORMED IN ~~LOC~~ HOSPITAL LAB): SARS Coronavirus 2: NEGATIVE

## 2018-12-04 SURGERY — LOWER EXTREMITY ANGIOGRAPHY
Anesthesia: Moderate Sedation | Laterality: Right

## 2018-12-04 MED ORDER — ONDANSETRON HCL 4 MG/2ML IJ SOLN: 4.0000 mg | Freq: Four times a day (QID) | INTRAMUSCULAR | Status: DC | PRN

## 2018-12-04 MED ORDER — HEPARIN SODIUM (PORCINE) 1000 UNIT/ML IJ SOLN
INTRAMUSCULAR | Status: AC
  Filled 2018-12-04: qty 1

## 2018-12-04 MED ORDER — MORPHINE SULFATE (PF) 4 MG/ML IV SOLN: 2.0000 mg | INTRAVENOUS | Status: DC | PRN

## 2018-12-04 MED ORDER — CLINDAMYCIN PHOSPHATE 300 MG/50ML IV SOLN: 300.0000 mg | Freq: Once | INTRAVENOUS | Status: DC

## 2018-12-04 MED ORDER — SODIUM CHLORIDE 0.9% FLUSH: 3.0000 mL | INTRAVENOUS | Status: DC | PRN

## 2018-12-04 MED ORDER — LIDOCAINE-EPINEPHRINE (PF) 1 %-1:200000 IJ SOLN
INTRAMUSCULAR | Status: AC
  Filled 2018-12-04: qty 30

## 2018-12-04 MED ORDER — MIDAZOLAM HCL 2 MG/ML PO SYRP: 8.0000 mg | ORAL_SOLUTION | Freq: Once | ORAL | Status: DC | PRN

## 2018-12-04 MED ORDER — SODIUM CHLORIDE 0.9% FLUSH: 3.0000 mL | Freq: Two times a day (BID) | INTRAVENOUS | Status: DC

## 2018-12-04 MED ORDER — FENTANYL CITRATE (PF) 100 MCG/2ML IJ SOLN
INTRAMUSCULAR | Status: DC | PRN
  Administered 2018-12-04 (×2): 25 ug via INTRAVENOUS
  Administered 2018-12-04: 50 ug via INTRAVENOUS

## 2018-12-04 MED ORDER — FAMOTIDINE 20 MG PO TABS
ORAL_TABLET | ORAL | Status: AC
  Administered 2018-12-04: 09:00:00 40 mg via ORAL
  Filled 2018-12-04: qty 2

## 2018-12-04 MED ORDER — HYDRALAZINE HCL 20 MG/ML IJ SOLN: 5.0000 mg | INTRAMUSCULAR | Status: DC | PRN

## 2018-12-04 MED ORDER — METHYLPREDNISOLONE SODIUM SUCC 125 MG IJ SOLR
125.0000 mg | Freq: Once | INTRAMUSCULAR | Status: AC | PRN
  Administered 2018-12-04: 125 mg via INTRAVENOUS

## 2018-12-04 MED ORDER — NITROGLYCERIN 1 MG/10 ML FOR IR/CATH LAB
INTRA_ARTERIAL | Status: DC | PRN
  Administered 2018-12-04: 200 ug

## 2018-12-04 MED ORDER — ACETAMINOPHEN 325 MG PO TABS: 650.0000 mg | ORAL_TABLET | ORAL | Status: DC | PRN

## 2018-12-04 MED ORDER — SODIUM CHLORIDE 0.9 % IV SOLN
INTRAVENOUS | Status: DC
  Administered 2018-12-04: 09:00:00 via INTRAVENOUS

## 2018-12-04 MED ORDER — FAMOTIDINE 20 MG PO TABS
40.0000 mg | ORAL_TABLET | Freq: Once | ORAL | Status: AC | PRN
  Administered 2018-12-04: 40 mg via ORAL

## 2018-12-04 MED ORDER — IOHEXOL 300 MG/ML  SOLN
INTRAMUSCULAR | Status: DC | PRN
  Administered 2018-12-04: 13:00:00 190 mL via INTRAVENOUS

## 2018-12-04 MED ORDER — OXYCODONE HCL 5 MG PO TABS: 5.0000 mg | ORAL_TABLET | ORAL | Status: DC | PRN

## 2018-12-04 MED ORDER — FENTANYL CITRATE (PF) 100 MCG/2ML IJ SOLN
INTRAMUSCULAR | Status: AC
  Filled 2018-12-04: qty 2

## 2018-12-04 MED ORDER — DIPHENHYDRAMINE HCL 50 MG/ML IJ SOLN
50.0000 mg | Freq: Once | INTRAMUSCULAR | Status: AC | PRN
  Administered 2018-12-04: 50 mg via INTRAVENOUS

## 2018-12-04 MED ORDER — METHYLPREDNISOLONE SODIUM SUCC 125 MG IJ SOLR
INTRAMUSCULAR | Status: AC
  Administered 2018-12-04: 125 mg via INTRAVENOUS
  Filled 2018-12-04: qty 2

## 2018-12-04 MED ORDER — CLINDAMYCIN PHOSPHATE 300 MG/50ML IV SOLN
INTRAVENOUS | Status: AC
  Administered 2018-12-04: 10:00:00
  Filled 2018-12-04: qty 50

## 2018-12-04 MED ORDER — SODIUM CHLORIDE 0.9 % IV SOLN: 250.0000 mL | INTRAVENOUS | Status: DC | PRN

## 2018-12-04 MED ORDER — HYDROMORPHONE HCL 1 MG/ML IJ SOLN: 1.0000 mg | Freq: Once | INTRAMUSCULAR | Status: DC | PRN

## 2018-12-04 MED ORDER — HEPARIN SODIUM (PORCINE) 1000 UNIT/ML IJ SOLN
INTRAMUSCULAR | Status: DC | PRN
  Administered 2018-12-04: 5000 [IU] via INTRAVENOUS
  Administered 2018-12-04: 2000 [IU] via INTRAVENOUS

## 2018-12-04 MED ORDER — MIDAZOLAM HCL 2 MG/2ML IJ SOLN
INTRAMUSCULAR | Status: DC | PRN
  Administered 2018-12-04: 1 mg via INTRAVENOUS
  Administered 2018-12-04 (×2): 0.5 mg via INTRAVENOUS

## 2018-12-04 MED ORDER — MIDAZOLAM HCL 5 MG/5ML IJ SOLN
INTRAMUSCULAR | Status: AC
  Filled 2018-12-04: qty 5

## 2018-12-04 MED ORDER — LABETALOL HCL 5 MG/ML IV SOLN: 10.0000 mg | INTRAVENOUS | Status: DC | PRN

## 2018-12-04 MED ORDER — DIPHENHYDRAMINE HCL 50 MG/ML IJ SOLN
INTRAMUSCULAR | Status: AC
  Administered 2018-12-04: 50 mg via INTRAVENOUS
  Filled 2018-12-04: qty 1

## 2018-12-04 MED ORDER — NITROGLYCERIN 1 MG/10 ML FOR IR/CATH LAB
INTRA_ARTERIAL | Status: DC | PRN
  Administered 2018-12-04: 500 ug via INTRA_ARTERIAL
  Administered 2018-12-04 (×2): 200 ug via INTRA_ARTERIAL

## 2018-12-04 SURGICAL SUPPLY — 49 items
BALLN ATG 14X4X80 (BALLOONS) ×4
BALLN COYOTE ES OTW 2X40X144 (BALLOONS) ×4
BALLN LUTONIX 018 4X150X130 (BALLOONS) ×4
BALLN LUTONIX AV 10X40X75 (BALLOONS) ×4
BALLN TREK RX 2.5X20 (BALLOONS) ×4
BALLN ULTRASCOR 014 2.5X40X150 (BALLOONS) ×4
BALLN ULTRASCORE .014 2X40X150 (BALLOONS) ×4
BALLN ULTRASCORE 014 3X100X150 (BALLOONS) ×4
BALLN ULTRASCORE 014 3X40X150 (BALLOONS) ×4
BALLN ULTRVRSE 1.5X40X150 (BALLOONS) ×4
BALLN ULTRVRSE 2.5X40X150 (BALLOONS) ×4
BALLN ULTRVRSE 3X40X150 (BALLOONS) ×2
BALLN ULTRVRSE 3X40X150 OTW (BALLOONS) ×2
BALLN WOLVERINE 2.50X15 (BALLOONS) ×4
BALLOON ATG 14X4X80 (BALLOONS) ×2 IMPLANT
BALLOON COYOTE ES OTW 2X40X144 (BALLOONS) ×2 IMPLANT
BALLOON LUTONIX 018 4X150X130 (BALLOONS) ×2 IMPLANT
BALLOON LUTONIX AV 10X40X75 (BALLOONS) ×2 IMPLANT
BALLOON TREK RX 2.5X20 (BALLOONS) ×2 IMPLANT
BALLOON ULTRSCR 014 2.5X40X150 (BALLOONS) ×2 IMPLANT
BALLOON ULTRSCRE .014 2X40X150 (BALLOONS) ×2 IMPLANT
BALLOON ULTRSCRE 014 3X100X150 (BALLOONS) ×2 IMPLANT
BALLOON ULTRSCRE 014 3X40X150 (BALLOONS) ×2 IMPLANT
BALLOON ULTRVRSE 1.5X40X150 (BALLOONS) ×2 IMPLANT
BALLOON ULTRVRSE 2.5X40X150 (BALLOONS) ×2 IMPLANT
BALLOON ULTRVRSE 3X40X150 OTW (BALLOONS) ×2 IMPLANT
BALLOON WOLVERINE 2.50X15 (BALLOONS) ×2 IMPLANT
CATH BEACON 5 .035 65 KMP TIP (CATHETERS) ×4 IMPLANT
CATH PIG 70CM (CATHETERS) ×4 IMPLANT
CATH SEEKER .035X150CM (CATHETERS) ×4 IMPLANT
CATH VERT 5FR 125CM (CATHETERS) ×4 IMPLANT
DEVICE PRESTO INFLATION (MISCELLANEOUS) ×4 IMPLANT
DEVICE STARCLOSE SE CLOSURE (Vascular Products) ×4 IMPLANT
DRAPE BRACHIAL (DRAPES) ×8 IMPLANT
GLIDEWIRE ADV .014X300CM (WIRE) ×4 IMPLANT
GLIDEWIRE ADV .035X260CM (WIRE) ×4 IMPLANT
NEEDLE ENTRY 21GA 7CM ECHOTIP (NEEDLE) ×8 IMPLANT
PACK ANGIOGRAPHY (CUSTOM PROCEDURE TRAY) ×4 IMPLANT
SET INTRO CAPELLA COAXIAL (SET/KITS/TRAYS/PACK) ×8 IMPLANT
SHEATH BRITE TIP 5FRX11 (SHEATH) ×4 IMPLANT
SHEATH BRITE TIP 7FRX5.5 (SHEATH) ×4 IMPLANT
SHEATH RAABE 6FRX70 (SHEATH) ×4 IMPLANT
SUT MNCRL AB 4-0 PS2 18 (SUTURE) ×4 IMPLANT
TOWEL OR 17X26 4PK STRL BLUE (TOWEL DISPOSABLE) ×8 IMPLANT
TUBING CONTRAST HIGH PRESS 72 (TUBING) ×4 IMPLANT
VALVE HEMO TOUHY BORST Y (ADAPTER) ×4 IMPLANT
WIRE G V18X300CM (WIRE) ×4 IMPLANT
WIRE J 3MM .035X145CM (WIRE) ×4 IMPLANT
WIRE MAGIC TOR.035 180C (WIRE) ×8 IMPLANT

## 2018-12-04 NOTE — Progress Notes (Signed)
Talked with [facility], Adonis Brook, to confirm that [employer] is a medical transport. Provided report and discharge instructions to [place], Therapist, sports.

## 2018-12-04 NOTE — Discharge Instructions (Signed)
Femoral Site Care °This sheet gives you information about how to care for yourself after your procedure. Your health care provider may also give you more specific instructions. If you have problems or questions, contact your health care provider. °What can I expect after the procedure? °After the procedure, it is common to have: °· Bruising that usually fades within 1-2 weeks. °· Tenderness at the site. °Follow these instructions at home: °Wound care °· Follow instructions from your health care provider about how to take care of your insertion site. Make sure you: °? Wash your hands with soap and water before you change your bandage (dressing). If soap and water are not available, use hand sanitizer. °? Change your dressing as told by your health care provider. °? Leave stitches (sutures), skin glue, or adhesive strips in place. These skin closures may need to stay in place for 2 weeks or longer. If adhesive strip edges start to loosen and curl up, you may trim the loose edges. Do not remove adhesive strips completely unless your health care provider tells you to do that. °· Do not take baths, swim, or use a hot tub until your health care provider approves. °· You may shower 24-48 hours after the procedure or as told by your health care provider. °? Gently wash the site with plain soap and water. °? Pat the area dry with a clean towel. °? Do not rub the site. This may cause bleeding. °· Do not apply powder or lotion to the site. Keep the site clean and dry. °· Check your femoral site every day for signs of infection. Check for: °? Redness, swelling, or pain. °? Fluid or blood. °? Warmth. °? Pus or a bad smell. °Activity °· For the first 2-3 days after your procedure, or as long as directed: °? Avoid climbing stairs as much as possible. °? Do not squat. °· Do not lift anything that is heavier than 10 lb (4.5 kg), or the limit that you are told, until your health care provider says that it is safe. °· Rest as  directed. °? Avoid sitting for a long time without moving. Get up to take short walks every 1-2 hours. °· Do not drive for 24 hours if you were given a medicine to help you relax (sedative). °General instructions °· Take over-the-counter and prescription medicines only as told by your health care provider. °· Keep all follow-up visits as told by your health care provider. This is important. °Contact a health care provider if you have: °· A fever or chills. °· You have redness, swelling, or pain around your insertion site. °Get help right away if: °· The catheter insertion area swells very fast. °· You pass out. °· You suddenly start to sweat or your skin gets clammy. °· The catheter insertion area is bleeding, and the bleeding does not stop when you hold steady pressure on the area. °· The area near or just beyond the catheter insertion site becomes pale, cool, tingly, or numb. °These symptoms may represent a serious problem that is an emergency. Do not wait to see if the symptoms will go away. Get medical help right away. Call your local emergency services (911 in the [place] Do not drive yourself to the hospital. °Summary °· After the procedure, it is common to have bruising that usually fades within 1-2 weeks. °· Check your femoral site every day for signs of infection. °· Do not lift anything that is heavier than 10 lb (4.5 kg), or the   limit that you are told, until your health care provider says that it is safe. This information is not intended to replace advice given to you by your health care provider. Make sure you discuss any questions you have with your health care provider. Document Released: 02/21/2014 Document Revised: 07/03/2017 Document Reviewed: 07/03/2017 Elsevier Interactive Patient Education  2019 [place] After This sheet gives you information about how to care for yourself after your procedure. Your doctor may also give you more specific instructions. If you have  problems or questions, contact your doctor. Follow these instructions at home: Insertion site care  Follow instructions from your doctor about how to take care of your long, thin tube (catheter) insertion area. Make sure you: ? Wash your hands with soap and water before you change your bandage (dressing). If you cannot use soap and water, use hand sanitizer. ? Change your bandage as told by your doctor. ? Leave stitches (sutures), skin glue, or skin tape (adhesive) strips in place. They may need to stay in place for 2 weeks or longer. If tape strips get loose and curl up, you may trim the loose edges. Do not remove tape strips completely unless your doctor says it is okay.  Do not take baths, swim, or use a hot tub until your doctor says it is okay.  You may shower 24-48 hours after the procedure or as told by your doctor. ? Gently wash the area with plain soap and water. ? Pat the area dry with a clean towel. ? Do not rub the area. This may cause bleeding.  Do not apply powder or lotion to the area. Keep the area clean and dry.  Check your insertion area every day for signs of infection. Check for: ? More redness, swelling, or pain. ? Fluid or blood. ? Warmth. ? Pus or a bad smell. Activity  Rest as told by your doctor, usually for 1-2 days.  Do not lift anything that is heavier than 10 lbs. (4.5 kg) or as told by your doctor.  Do not drive for 24 hours if you were given a medicine to help you relax (sedative).  Do not drive or use heavy machinery while taking prescription pain medicine. General instructions   Go back to your normal activities as told by your doctor, usually in about a week. Ask your doctor what activities are safe for you.  If the insertion area starts to bleed, lie flat and put pressure on the area. If the bleeding does not stop, get help right away. This is an emergency.  Drink enough fluid to keep your pee (urine) clear or pale yellow.  Take  over-the-counter and prescription medicines only as told by your doctor.  Keep all follow-up visits as told by your doctor. This is important. Contact a doctor if:  You have a fever.  You have chills.  You have more redness, swelling, or pain around your insertion area.  You have fluid or blood coming from your insertion area.  The insertion area feels warm to the touch.  You have pus or a bad smell coming from your insertion area.  You have more bruising around the insertion area.  Blood collects in the tissue around the insertion area (hematoma) that may be painful to the touch. Get help right away if:  You have a lot of pain in the insertion area.  The insertion area swells very fast.  The insertion area is bleeding, and the bleeding does not  stop after holding steady pressure on the area.  The area near or just beyond the insertion area becomes pale, cool, tingly, or numb. These symptoms may be an emergency. Do not wait to see if the symptoms will go away. Get medical help right away. Call your local emergency services (911 in the [place] Do not drive yourself to the hospital. Summary  After the procedure, it is common to have bruising and tenderness at the long, thin tube insertion area.  After the procedure, it is important to rest and drink plenty of fluids.  Do not take baths, swim, or use a hot tub until your doctor says it is okay to do so. You may shower 24-48 hours after the procedure or as told by your doctor.  If the insertion area starts to bleed, lie flat and put pressure on the area. If the bleeding does not stop, get help right away. This is an emergency. This information is not intended to replace advice given to you by your health care provider. Make sure you discuss any questions you have with your health care provider. Document Released: 09/16/2008 Document Revised: 06/14/2016 Document Reviewed: 06/14/2016 Elsevier Interactive Patient Education  2019  [place]. Moderate Conscious Sedation, Adult, Care After These instructions provide you with information about caring for yourself after your procedure. Your health care provider may also give you more specific instructions. Your treatment has been planned according to current medical practices, but problems sometimes occur. Call your health care provider if you have any problems or questions after your procedure. What can I expect after the procedure? After your procedure, it is common:  To feel sleepy for several hours.  To feel clumsy and have poor balance for several hours.  To have poor judgment for several hours.  To vomit if you eat too soon. Follow these instructions at home: For at least 24 hours after the procedure:   Do not: ? Participate in activities where you could fall or become injured. ? Drive. ? Use heavy machinery. ? Drink alcohol. ? Take sleeping pills or medicines that cause drowsiness. ? Make important decisions or sign legal documents. ? Take care of children on your own.  Rest. Eating and drinking  Follow the diet recommended by your health care provider.  If you vomit: ? Drink water, juice, or soup when you can drink without vomiting. ? Make sure you have little or no nausea before eating solid foods. General instructions  Have a responsible adult stay with you until you are awake and alert.  Take over-the-counter and prescription medicines only as told by your health care provider.  If you smoke, do not smoke without supervision.  Keep all follow-up visits as told by your health care provider. This is important. Contact a health care provider if:  You keep feeling nauseous or you keep vomiting.  You feel light-headed.  You develop a rash.  You have a fever. Get help right away if:  You have trouble breathing. This information is not intended to replace advice given to you by your health care provider. Make sure you discuss any  questions you have with your health care provider. Document Released: 04/10/2013 Document Revised: 11/23/2015 Document Reviewed: 10/10/2015 Elsevier Interactive Patient Education  2019 [employer].

## 2018-12-04 NOTE — Op Note (Signed)
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 Introduction catheter into left lower extremity 3rd order catheter placement  2. Contrast injection left lower extremity for distal runoff with additional 3rd order catheter placement  3. Percutaneous transluminal angioplasty in 2 locations left anterior tibial artery              4. Percutaneous transluminal angioplasty right subclavian and innominate vein  5. Introduction catheter into superior vena cava left brachial vein approach             6.  Star close closure right common femoral arteriotomy  Anesthesia: Conscious sedation was administered under my direct supervision by the interventional radiology RN. IV Versed plus fentanyl were utilized. Continuous ECG, pulse oximetry and blood pressure was monitored throughout the entire procedure.  Conscious sedation was for a total of 2 hour 45 minutes.  Sheath: 6 French sheath right common femoral artery retrograde; 5 French sheath of the right brachial vein antegrade.  Contrast: 190 cc  Fluoroscopy Time: 25.1 minutes  Indications: Nolon H Wyly presents with increasing pain and ulcerations of the left lower extremity. This suggests the patient is having limb threatening ischemia.  He also requires a new upper extremity AV access.  Noninvasive studies and physical exam support a right brachial axillary AV graft.  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  The risks and benefits are reviewed all questions answered patient agrees to proceed.  Procedure:Dennis Walker is a 68 y.o. y.o. male who was identified and appropriate procedural time out was performed. The patient was then placed supine on the table and prepped and draped in the usual sterile fashion.   Ultrasound was placed in the sterile sleeve and the right groin was evaluated the right common femoral artery was echolucent and pulsatile indicating patency. Image was recorded for the permanent record and under real-time visualization a microneedle was inserted into the common femoral artery followed by the microwire and then the micro-sheath. A J-wire was then advanced through the micro-sheath and a 5 [place] sheath was then inserted over a J-wire. J-wire was then advanced and a 5 French pigtail catheter was positioned at the level of T12.  AP projection of the aorta was then obtained. Pigtail catheter was repositioned to above the bifurcation and a RAO view of the pelvis was obtained. Subsequently a pigtail catheter with the stiff angle Glidewire was used to cross the aortic bifurcation the catheter wire were advanced down into the left distal external iliac artery. Oblique view of the femoral bifurcation was then obtained and subsequently the wire was reintroduced and the pigtail catheter negotiated into the SFA representing third order catheter placement. Distal runoff was then performed however imaging in the distal half of the tibial vessels was inadequate and a Kumpe catheter and advantage wire were used to first select the posterior tibial artery and perform hand-injection and then the wire and catheter were pulled back into the popliteal and the anterior tibial artery was selected representing the additional third order catheter placement.  Distal runoff was then completed.    Diagnostic interpretation: The  abdominal aorta is opacified with a bolus injection of  contrast.  There is diffuse calcification throughout the entire arterial system but there are no hemodynamically significant lesions identified in the aorta bilateral common iliac and bilateral external iliac arteries.  Internal iliac arteries are patent.  Of note there is nonvisualization of the renal arteries there are no nephrograms consistent with his end-stage renal disease.  The right common femoral profunda femoris superficial femoral and popliteal arteries are diffusely calcified and easily identified on plain fluoroscopy.  However, there are no focal hemodynamically significant lesions noted.  The trifurcation is patent the anterior tibial appears to be the largest tibial to the foot but there are multiple greater than 90% lesions one in the proximal one third and one distally at the level of the ankle.  Tibioperoneal trunk is patent posterior tibial is small but patent.  Peroneal is patent but does not appear to contribute to the foot.  Of note, the posterior tibial appears to fill the heel area but there does not appear to be any meaningful contribution to the pedal arch.  Magnified imaging of the dorsalis pedis artery distal to the above-noted lesion demonstrates filling of the pedal arch as well as the digital vessels.  This would indicate that the anterior tibial is the dominant runoff to the forefoot.  Based on this information I elected to move forward with intervention for limb salvage.  5000 units of heparin was then given and allowed to circulate and a 70 cm 6 French Raby sheath was advanced up and over the bifurcation and positioned in the femoral artery  Using the advantage wire and a seeker catheter both of the anterior tibial lesions were crossed.  Distally the lesion was crossed with a wire only not the seeker catheter.  Ultimately the 035 advantage wire was exchanged for an 014 advantage wire.  The distal lesion then represented a technical challenge as it was very difficult to get a  balloon to cross initially a 1.5 mm x 40 mm Ultraverse balloon did cross ultra score balloons would not cross.  Even though there was a successful inflation at 1.5 mm this did not improve the crossing profile.  Ultimately a 2.5 mm Wolverine cutting coronary balloon was successful and inflation was performed to 10 atm for 1 minute.  Following this I was able to get a 3 mm x 40 mm ultra versed balloon and perform inflation to 14 atm for 1 minute.  Follow-up imaging demonstrated approximately 20% residual stenosis with rapid flow of contrast and a marked improvement from the string sign that existed previously.  Attention was then turned to the more proximal anterior tibial lesion where a 3 mm x 100 mm ultra score balloon was used to treat this lesion following this a 4 mm x 100 mm Lutonix drug-eluting balloon was used post dilate this area with an inflation to 4 atm.  Inflation was for 1 minute.  Follow-up imaging demonstrated less than 5% residual stenosis within the proximal portion of the anterior tibial.  After review of these images the sheath is pulled into the right external iliac oblique of the common femoral is obtained and a Star close device deployed. There no immediate Complications.    The patient is then re-positioned with his right arm extended and reprepped and draped in sterile fashion.  Ultrasound is delivered back to the field and replaced in a sterile sleeve.  The brachial vein is identified just above the antecubital crease it is echolucent  compressible indicating patency.  Images recorded for the permanent record.  Under direct ultrasound visualization after 1% lidocaine is been infiltrated micro needle is inserted into the brachial vein followed by microwire.  Micro-sheath is then inserted.  Hand-injection contrast is utilized to demonstrate the venous anatomy of the right arm as well as the central venous anatomy.  Imaging demonstrates a stricture of the subclavian at the confluence with  the right internal jugular.  Therefore a Kumpe catheter and wire are negotiated through this lesion and into the inferior vena cava.  A 7 French sheath is advanced over the wire.  Initially the lesion was dilated with a 10 mm x 40 mm Lutonix drug-eluting balloon.  Follow-up imaging demonstrated it remained undersized and a 14 mm x 40 mm Atlas balloon was advanced across the lesion and used to treat this lesion inflation was to 10 atm for 1 minute.  Follow-up imaging now demonstrated less than 10% residual stenosis.  Wire was removed and the sheath was pulled pressure was held there were no immediate complications.   Findings:  The abdominal aorta is opacified with a bolus injection of contrast.  There is diffuse calcification throughout the entire arterial system but there are no hemodynamically significant lesions identified in the aorta bilateral common iliac and bilateral external iliac arteries.  Internal iliac arteries are patent.  Of note there is nonvisualization of the renal arteries there are no nephrograms consistent with his end-stage renal disease.  The right common femoral profunda femoris superficial femoral and popliteal arteries are diffusely calcified and easily identified on plain fluoroscopy.  However, there are no focal hemodynamically significant lesions noted.  The trifurcation is patent the anterior tibial appears to be the largest tibial to the foot but there are multiple greater than 90% lesions one in the proximal one third and one distally at the level of the ankle.  Tibioperoneal trunk is patent posterior tibial is small but patent.  Peroneal is patent but does not appear to contribute to the foot.  Of note, the posterior tibial appears to fill the heel area but there does not appear to be any meaningful contribution to the pedal arch.  Magnified imaging of the dorsalis pedis artery distal to the above-noted lesion demonstrates filling of the pedal arch as well as the digital  vessels.  This would indicate that the anterior tibial is the dominant runoff to the forefoot.    Following angioplasty the anterior tibial tibial now is now widely patent and demonstrates in-line flow.  It is much improved and looks quite nice with less than 20% residual stenosis overall.  Imaging of the right arm venous system demonstrates patency of the brachial veins as well as the right axillary vein.  Axillary vein looks quite good for the planned brachial axillary AV graft.  Subclavian vein is widely patent distally but at the confluence with the jugular vein there is a greater than 60% narrowing.  This is focal.  Proximal to this the innominate vein appears widely patent as is the superior vena cava.  Following angioplasty to 14 mm there is now less than 10% residual stenosis and improved flow.  Patient now has acceptable anatomy for a brachial axillary AV graft.  Summary: Successful recanalization right lower extremity for limb salvage; successful angioplasty of the central venous lesion   Disposition: Patient was taken to the recovery room in stable condition having tolerated the procedure well.  Belenda Cruise Geana Walts 12/04/2018,1:08 PM

## 2018-12-04 NOTE — H&P (Signed)
[facility] History & Physical Update  The patient was interviewed and re-examined.  The patient's previous History and Physical has been reviewed and is unchanged.  There is no change in the plan of care. We plan to proceed with the scheduled procedure.  Hortencia Pilar, MD  12/04/2018, 8:15 AM

## 2018-12-05 ENCOUNTER — Encounter: Payer: Self-pay | Admitting: Vascular Surgery

## 2018-12-21 ENCOUNTER — Encounter (INDEPENDENT_AMBULATORY_CARE_PROVIDER_SITE_OTHER): Payer: Medicare Other

## 2018-12-21 ENCOUNTER — Ambulatory Visit (INDEPENDENT_AMBULATORY_CARE_PROVIDER_SITE_OTHER): Payer: Medicare Other | Admitting: Nurse Practitioner

## 2018-12-24 ENCOUNTER — Ambulatory Visit (INDEPENDENT_AMBULATORY_CARE_PROVIDER_SITE_OTHER): Payer: Medicare Other | Admitting: Vascular Surgery

## 2018-12-24 ENCOUNTER — Encounter (INDEPENDENT_AMBULATORY_CARE_PROVIDER_SITE_OTHER): Payer: Medicare Other

## 2019-02-07 ENCOUNTER — Other Ambulatory Visit (INDEPENDENT_AMBULATORY_CARE_PROVIDER_SITE_OTHER): Payer: Self-pay | Admitting: Vascular Surgery

## 2019-02-07 DIAGNOSIS — Z9862 Peripheral vascular angioplasty status: Secondary | ICD-10-CM

## 2019-02-11 ENCOUNTER — Other Ambulatory Visit: Payer: Self-pay

## 2019-02-11 ENCOUNTER — Ambulatory Visit (INDEPENDENT_AMBULATORY_CARE_PROVIDER_SITE_OTHER): Payer: Medicare Other

## 2019-02-11 ENCOUNTER — Encounter (INDEPENDENT_AMBULATORY_CARE_PROVIDER_SITE_OTHER): Payer: Self-pay | Admitting: Vascular Surgery

## 2019-02-11 ENCOUNTER — Encounter (INDEPENDENT_AMBULATORY_CARE_PROVIDER_SITE_OTHER): Payer: Self-pay

## 2019-02-11 ENCOUNTER — Ambulatory Visit (INDEPENDENT_AMBULATORY_CARE_PROVIDER_SITE_OTHER): Payer: Medicare Other | Admitting: Vascular Surgery

## 2019-02-11 VITALS — BP 170/83 | HR 65 | Resp 10 | Ht 71.0 in | Wt 170.0 lb

## 2019-02-11 DIAGNOSIS — I70213 Atherosclerosis of native arteries of extremities with intermittent claudication, bilateral legs: Secondary | ICD-10-CM

## 2019-02-11 DIAGNOSIS — I2510 Atherosclerotic heart disease of native coronary artery without angina pectoris: Secondary | ICD-10-CM | POA: Diagnosis not present

## 2019-02-11 DIAGNOSIS — T829XXS Unspecified complication of cardiac and vascular prosthetic device, implant and graft, sequela: Secondary | ICD-10-CM

## 2019-02-11 DIAGNOSIS — Z9862 Peripheral vascular angioplasty status: Secondary | ICD-10-CM | POA: Diagnosis not present

## 2019-02-11 DIAGNOSIS — I10 Essential (primary) hypertension: Secondary | ICD-10-CM

## 2019-02-11 DIAGNOSIS — I871 Compression of vein: Secondary | ICD-10-CM

## 2019-02-11 DIAGNOSIS — Z794 Long term (current) use of insulin: Secondary | ICD-10-CM

## 2019-02-11 DIAGNOSIS — E1152 Type 2 diabetes mellitus with diabetic peripheral angiopathy with gangrene: Secondary | ICD-10-CM
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
## 2019-04-02 ENCOUNTER — Ambulatory Visit: Payer: Medicare Other | Attending: Infectious Diseases | Admitting: Infectious Diseases

## 2019-04-02 ENCOUNTER — Other Ambulatory Visit: Payer: Self-pay

## 2019-04-02 DIAGNOSIS — H5461 Unqualified visual loss, right eye, normal vision left eye: Secondary | ICD-10-CM

## 2019-04-02 DIAGNOSIS — E785 Hyperlipidemia, unspecified: Secondary | ICD-10-CM

## 2019-04-02 DIAGNOSIS — R7881 Bacteremia: Secondary | ICD-10-CM

## 2019-04-02 DIAGNOSIS — I2510 Atherosclerotic heart disease of native coronary artery without angina pectoris: Secondary | ICD-10-CM

## 2019-04-02 DIAGNOSIS — E1122 Type 2 diabetes mellitus with diabetic chronic kidney disease: Secondary | ICD-10-CM

## 2019-04-02 DIAGNOSIS — G8194 Hemiplegia, unspecified affecting left nondominant side: Secondary | ICD-10-CM

## 2019-04-02 DIAGNOSIS — M4642 Discitis, unspecified, cervical region: Secondary | ICD-10-CM | POA: Diagnosis not present

## 2019-04-02 DIAGNOSIS — M4622 Osteomyelitis of vertebra, cervical region: Secondary | ICD-10-CM

## 2019-04-02 DIAGNOSIS — Z89511 Acquired absence of right leg below knee: Secondary | ICD-10-CM

## 2019-04-02 DIAGNOSIS — E1169 Type 2 diabetes mellitus with other specified complication: Secondary | ICD-10-CM

## 2019-04-02 DIAGNOSIS — N186 End stage renal disease: Secondary | ICD-10-CM

## 2019-04-02 DIAGNOSIS — M62441 Contracture of muscle, right hand: Secondary | ICD-10-CM

## 2019-04-02 DIAGNOSIS — M4620 Osteomyelitis of vertebra, site unspecified: Secondary | ICD-10-CM

## 2019-04-02 DIAGNOSIS — B9562 Methicillin resistant Staphylococcus aureus infection as the cause of diseases classified elsewhere: Secondary | ICD-10-CM

## 2019-04-02 DIAGNOSIS — Z9862 Peripheral vascular angioplasty status: Secondary | ICD-10-CM

## 2019-04-02 DIAGNOSIS — Z992 Dependence on renal dialysis: Secondary | ICD-10-CM

## 2019-04-02 DIAGNOSIS — Z87828 Personal history of other (healed) physical injury and trauma: Secondary | ICD-10-CM

## 2019-04-02 DIAGNOSIS — Z8614 Personal history of Methicillin resistant Staphylococcus aureus infection: Secondary | ICD-10-CM

## 2019-04-02 DIAGNOSIS — I120 Hypertensive chronic kidney disease with stage 5 chronic kidney disease or end stage renal disease: Secondary | ICD-10-CM

## 2019-04-02 DIAGNOSIS — E1151 Type 2 diabetes mellitus with diabetic peripheral angiopathy without gangrene: Secondary | ICD-10-CM

## 2019-04-02 NOTE — Progress Notes (Signed)
The purpose of this virtual visit is to provide medical care while limiting exposure to the novel coronavirus (COVID19) for both patient and office staff.   Consent was obtained for video visit:  Yes.   Answered questions that patient had about telehealth interaction:  Yes.   I discussed the limitations, risks, security and privacy concerns of performing an evaluation and management service by telephone. I also discussed with the patient that there may be a patient responsible charge related to this service. The patient expressed understanding and agreed to proceed.   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  He is doing well- he has no neck pain at all- Month 8 of antibiotic He has no fever or chills or rash or diarrhea. 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 The films were evaluated by neurosurgery and they did not think he needed any surgical intervention. TEE was negative.As the MIC of vancomycin was 0.5 he received 10 weeks of vancomycinc and since the completion of IV has been on PO doxycycline for the past 12 months. 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 Treated with 10 weeks of IV vanco and then with doxy for 10 months- completed 3 weeks ago-  Will do ESR/CRP  ESRD on dialysis. No further graft issues, Graft on the left arm hadgiven some problems in the past  and he has multiple stents . Doppler of the left upper extremity May 2020 showed no stenosis of the fistula. 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

## 2019-04-11 ENCOUNTER — Other Ambulatory Visit: Payer: Self-pay | Admitting: Family Medicine

## 2019-05-09 ENCOUNTER — Emergency Department
Admission: EM | Admit: 2019-05-09 | Discharge: 2019-05-09 | Disposition: A | Discharge status: Home / Self Care | Payer: Medicare Other | Attending: Emergency Medicine | Admitting: Emergency Medicine

## 2019-05-09 ENCOUNTER — Encounter: Payer: Self-pay | Admitting: Emergency Medicine

## 2019-05-09 DIAGNOSIS — T82838A Hemorrhage of vascular prosthetic devices, implants and grafts, initial encounter: Secondary | ICD-10-CM | POA: Diagnosis not present

## 2019-05-09 DIAGNOSIS — Z992 Dependence on renal dialysis: Secondary | ICD-10-CM | POA: Diagnosis not present

## 2019-05-09 DIAGNOSIS — I2510 Atherosclerotic heart disease of native coronary artery without angina pectoris: Secondary | ICD-10-CM | POA: Insufficient documentation

## 2019-05-09 DIAGNOSIS — I252 Old myocardial infarction: Secondary | ICD-10-CM | POA: Diagnosis not present

## 2019-05-09 DIAGNOSIS — Z7982 Long term (current) use of aspirin: Secondary | ICD-10-CM | POA: Diagnosis not present

## 2019-05-09 DIAGNOSIS — I509 Heart failure, unspecified: Secondary | ICD-10-CM | POA: Diagnosis not present

## 2019-05-09 DIAGNOSIS — Y711 Therapeutic (nonsurgical) and rehabilitative cardiovascular devices associated with adverse incidents: Secondary | ICD-10-CM | POA: Insufficient documentation

## 2019-05-09 DIAGNOSIS — J45909 Unspecified asthma, uncomplicated: Secondary | ICD-10-CM | POA: Diagnosis not present

## 2019-05-09 DIAGNOSIS — Z7902 Long term (current) use of antithrombotics/antiplatelets: Secondary | ICD-10-CM | POA: Diagnosis not present

## 2019-05-09 DIAGNOSIS — E1122 Type 2 diabetes mellitus with diabetic chronic kidney disease: Secondary | ICD-10-CM | POA: Insufficient documentation

## 2019-05-09 DIAGNOSIS — I132 Hypertensive heart and chronic kidney disease with heart failure and with stage 5 chronic kidney disease, or end stage renal disease: Secondary | ICD-10-CM | POA: Insufficient documentation

## 2019-05-09 DIAGNOSIS — Z79899 Other long term (current) drug therapy: Secondary | ICD-10-CM | POA: Diagnosis not present

## 2019-05-09 DIAGNOSIS — N186 End stage renal disease: Secondary | ICD-10-CM | POA: Diagnosis not present

## 2019-05-09 NOTE — ED Notes (Addendum)
Spoke with Maudie Mercury from [facility]. Reported she is going to call transport and then call me back about patient being transported for discharge back to dialysis

## 2019-05-09 NOTE — ED Notes (Signed)
Patient arrived with clamp over dialysis site due to bleeding before dialysis treatment started. Denies pain. Bleeding controlled at this time

## 2019-05-09 NOTE — ED Triage Notes (Signed)
Pt arrived via EMS from Dialysis where pts fistula was found to be bleeding prior to treatment. Per pt, the port has been bleeding since Tuesday, post dialysis. Bleeding controled at this time. Pt is from [facility].

## 2019-05-09 NOTE — ED Notes (Signed)
Spoke with forestine, Therapist, sports at [facility]. She reports patient can show up in the next couple of hours and still receive dialysis.

## 2019-05-09 NOTE — Discharge Instructions (Addendum)
Please proceed directly to your dialysis center to receive your scheduled dialysis session today.
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
## 2019-05-09 NOTE — ED Notes (Signed)
[place], [place], [place]

## 2019-05-15 ENCOUNTER — Telehealth (INDEPENDENT_AMBULATORY_CARE_PROVIDER_SITE_OTHER): Payer: Self-pay

## 2019-05-15 NOTE — Telephone Encounter (Signed)
Spoke with Celeste at [facility] regarding the patient being scheduled for a fistulagram. Patient is scheduled with Dr. Delana Meyer for left arm fistulagram on 05/24/2019 with a 12:00 pm arrival time to the [facility]. Patient will do his Covid testing on 05/22/2019 between 12:30-2:30 pm. Pre-procedure instructions will be faxed to Shanon Payor attention [place] and she will contact [facility].

## 2019-05-20 ENCOUNTER — Encounter (INDEPENDENT_AMBULATORY_CARE_PROVIDER_SITE_OTHER): Payer: Medicare Other

## 2019-05-20 ENCOUNTER — Ambulatory Visit (INDEPENDENT_AMBULATORY_CARE_PROVIDER_SITE_OTHER): Payer: Medicare Other | Admitting: Vascular Surgery

## 2019-05-20 DIAGNOSIS — Z4789 Encounter for other orthopedic aftercare: Secondary | ICD-10-CM | POA: Insufficient documentation

## 2019-05-21 ENCOUNTER — Other Ambulatory Visit: Admission: RE | Admit: 2019-05-21 | Payer: Medicare Other | Source: Ambulatory Visit

## 2019-05-23 ENCOUNTER — Other Ambulatory Visit (INDEPENDENT_AMBULATORY_CARE_PROVIDER_SITE_OTHER): Payer: Self-pay | Admitting: Nurse Practitioner

## 2019-05-24 ENCOUNTER — Encounter: Admission: RE | Payer: Self-pay | Source: Home / Self Care

## 2019-05-24 ENCOUNTER — Ambulatory Visit: Admission: RE | Admit: 2019-05-24 | Payer: Medicare Other | Source: Home / Self Care | Admitting: Vascular Surgery

## 2019-05-24 SURGERY — A/V FISTULAGRAM
Anesthesia: Moderate Sedation | Laterality: Left

## 2019-05-24 MED ORDER — CLINDAMYCIN PHOSPHATE 300 MG/50ML IV SOLN: 300.0000 mg | Freq: Once | INTRAVENOUS | Status: DC

## 2019-05-28 ENCOUNTER — Telehealth (INDEPENDENT_AMBULATORY_CARE_PROVIDER_SITE_OTHER): Payer: Self-pay

## 2019-05-28 ENCOUNTER — Encounter (INDEPENDENT_AMBULATORY_CARE_PROVIDER_SITE_OTHER): Payer: Self-pay

## 2019-05-28 NOTE — Telephone Encounter (Signed)
Spoke with Celeste at [facility] and she is aware the patient has been rescheduled for fistulagram with Dr. Delana Meyer on 06/05/2019 with a 10:30 am arrival time to the [facility]. Patient will do his Covid testing on 06/03/2019 between 12:30-2:30 pm at the [facility]. Pre-procedure instructions will be faxed to [facility] at [facility] and she in turn will speak with [facility] to ensure the patient does all steps.

## 2019-05-28 NOTE — Telephone Encounter (Signed)
Dennis Walker at Dennis Walker has been informed of the time change for the patent. Arrival time changed from 10:30 am to 2:00 pm at the MM. New information with the time change has been faxed to [facility] at [facility].

## 2019-06-03 ENCOUNTER — Other Ambulatory Visit: Payer: Self-pay

## 2019-06-03 ENCOUNTER — Other Ambulatory Visit
Admission: RE | Admit: 2019-06-03 | Discharge: 2019-06-03 | Disposition: A | Discharge status: Home / Self Care | Payer: Medicare Other | Source: Ambulatory Visit | Attending: Vascular Surgery | Admitting: Vascular Surgery

## 2019-06-03 DIAGNOSIS — Z01812 Encounter for preprocedural laboratory examination: Secondary | ICD-10-CM | POA: Insufficient documentation

## 2019-06-03 DIAGNOSIS — Z20828 Contact with and (suspected) exposure to other viral communicable diseases: Secondary | ICD-10-CM | POA: Diagnosis not present

## 2019-06-04 ENCOUNTER — Other Ambulatory Visit (INDEPENDENT_AMBULATORY_CARE_PROVIDER_SITE_OTHER): Payer: Self-pay | Admitting: Nurse Practitioner

## 2019-06-04 LAB — SARS CORONAVIRUS 2 (TAT 6-24 HRS): SARS Coronavirus 2: NEGATIVE

## 2019-06-05 ENCOUNTER — Encounter
Admission: RE | Disposition: A | Discharge status: Skilled Nursing Facility | Payer: Self-pay | Source: Home / Self Care | Attending: Vascular Surgery

## 2019-06-05 ENCOUNTER — Other Ambulatory Visit: Payer: Self-pay

## 2019-06-05 ENCOUNTER — Ambulatory Visit
Admission: RE | Admit: 2019-06-05 | Discharge: 2019-06-05 | Disposition: A | Discharge status: Skilled Nursing Facility | Payer: Medicare Other | Attending: Vascular Surgery | Admitting: Vascular Surgery

## 2019-06-05 DIAGNOSIS — E1151 Type 2 diabetes mellitus with diabetic peripheral angiopathy without gangrene: Secondary | ICD-10-CM | POA: Diagnosis not present

## 2019-06-05 DIAGNOSIS — E785 Hyperlipidemia, unspecified: Secondary | ICD-10-CM | POA: Insufficient documentation

## 2019-06-05 DIAGNOSIS — Z881 Allergy status to other antibiotic agents status: Secondary | ICD-10-CM | POA: Diagnosis not present

## 2019-06-05 DIAGNOSIS — I132 Hypertensive heart and chronic kidney disease with heart failure and with stage 5 chronic kidney disease, or end stage renal disease: Secondary | ICD-10-CM | POA: Diagnosis not present

## 2019-06-05 DIAGNOSIS — J45909 Unspecified asthma, uncomplicated: Secondary | ICD-10-CM | POA: Diagnosis not present

## 2019-06-05 DIAGNOSIS — T82858A Stenosis of vascular prosthetic devices, implants and grafts, initial encounter: Secondary | ICD-10-CM | POA: Insufficient documentation

## 2019-06-05 DIAGNOSIS — I509 Heart failure, unspecified: Secondary | ICD-10-CM | POA: Insufficient documentation

## 2019-06-05 DIAGNOSIS — I2510 Atherosclerotic heart disease of native coronary artery without angina pectoris: Secondary | ICD-10-CM | POA: Diagnosis not present

## 2019-06-05 DIAGNOSIS — E1122 Type 2 diabetes mellitus with diabetic chronic kidney disease: Secondary | ICD-10-CM | POA: Diagnosis not present

## 2019-06-05 DIAGNOSIS — Z823 Family history of stroke: Secondary | ICD-10-CM | POA: Diagnosis not present

## 2019-06-05 DIAGNOSIS — N186 End stage renal disease: Secondary | ICD-10-CM | POA: Insufficient documentation

## 2019-06-05 DIAGNOSIS — I252 Old myocardial infarction: Secondary | ICD-10-CM | POA: Insufficient documentation

## 2019-06-05 DIAGNOSIS — Z882 Allergy status to sulfonamides status: Secondary | ICD-10-CM | POA: Insufficient documentation

## 2019-06-05 DIAGNOSIS — E11649 Type 2 diabetes mellitus with hypoglycemia without coma: Secondary | ICD-10-CM | POA: Insufficient documentation

## 2019-06-05 DIAGNOSIS — Y832 Surgical operation with anastomosis, bypass or graft as the cause of abnormal reaction of the patient, or of later complication, without mention of misadventure at the time of the procedure: Secondary | ICD-10-CM | POA: Insufficient documentation

## 2019-06-05 DIAGNOSIS — M1990 Unspecified osteoarthritis, unspecified site: Secondary | ICD-10-CM | POA: Insufficient documentation

## 2019-06-05 DIAGNOSIS — I739 Peripheral vascular disease, unspecified: Secondary | ICD-10-CM

## 2019-06-05 DIAGNOSIS — T82898A Other specified complication of vascular prosthetic devices, implants and grafts, initial encounter: Secondary | ICD-10-CM | POA: Diagnosis not present

## 2019-06-05 DIAGNOSIS — Z992 Dependence on renal dialysis: Secondary | ICD-10-CM | POA: Diagnosis not present

## 2019-06-05 DIAGNOSIS — Z89511 Acquired absence of right leg below knee: Secondary | ICD-10-CM | POA: Insufficient documentation

## 2019-06-05 DIAGNOSIS — Z8673 Personal history of transient ischemic attack (TIA), and cerebral infarction without residual deficits: Secondary | ICD-10-CM | POA: Diagnosis not present

## 2019-06-05 HISTORY — PX: A/V FISTULAGRAM: CATH118298

## 2019-06-05 LAB — POTASSIUM (ARMC VASCULAR LAB ONLY): Potassium (ARMC vascular lab): 4.1 (ref 3.5–5.1)

## 2019-06-05 LAB — GLUCOSE, CAPILLARY: Glucose-Capillary: 109 mg/dL — ABNORMAL HIGH (ref 70–99)

## 2019-06-05 SURGERY — A/V FISTULAGRAM
Anesthesia: Moderate Sedation

## 2019-06-05 MED ORDER — IODIXANOL 320 MG/ML IV SOLN
INTRAVENOUS | Status: DC | PRN
  Administered 2019-06-05: 75 mL via INTRA_ARTERIAL

## 2019-06-05 MED ORDER — METHYLPREDNISOLONE SODIUM SUCC 125 MG IJ SOLR
125.0000 mg | Freq: Once | INTRAMUSCULAR | Status: AC | PRN
  Administered 2019-06-05: 15:00:00 125 mg via INTRAVENOUS

## 2019-06-05 MED ORDER — FENTANYL CITRATE (PF) 100 MCG/2ML IJ SOLN
INTRAMUSCULAR | Status: AC
  Filled 2019-06-05: qty 2

## 2019-06-05 MED ORDER — CLINDAMYCIN PHOSPHATE 300 MG/50ML IV SOLN
300.0000 mg | Freq: Once | INTRAVENOUS | Status: AC
  Administered 2019-06-05: 15:00:00 300 mg via INTRAVENOUS

## 2019-06-05 MED ORDER — DIPHENHYDRAMINE HCL 50 MG/ML IJ SOLN
50.0000 mg | Freq: Once | INTRAMUSCULAR | Status: AC | PRN
  Administered 2019-06-05: 15:00:00 50 mg via INTRAVENOUS

## 2019-06-05 MED ORDER — METHYLPREDNISOLONE SODIUM SUCC 125 MG IJ SOLR
INTRAMUSCULAR | Status: AC
  Administered 2019-06-05: 125 mg via INTRAVENOUS
  Filled 2019-06-05: qty 2

## 2019-06-05 MED ORDER — SODIUM CHLORIDE 0.9 % IV SOLN
INTRAVENOUS | Status: DC
  Administered 2019-06-05: 15:00:00 via INTRAVENOUS

## 2019-06-05 MED ORDER — HEPARIN SODIUM (PORCINE) 1000 UNIT/ML IJ SOLN
INTRAMUSCULAR | Status: DC | PRN
  Administered 2019-06-05: 3000 [IU] via INTRAVENOUS

## 2019-06-05 MED ORDER — HEPARIN SODIUM (PORCINE) 1000 UNIT/ML IJ SOLN
INTRAMUSCULAR | Status: AC
  Filled 2019-06-05: qty 1

## 2019-06-05 MED ORDER — FENTANYL CITRATE (PF) 100 MCG/2ML IJ SOLN
INTRAMUSCULAR | Status: DC | PRN
  Administered 2019-06-05: 50 ug via INTRAVENOUS

## 2019-06-05 MED ORDER — MIDAZOLAM HCL 5 MG/5ML IJ SOLN
INTRAMUSCULAR | Status: AC
  Filled 2019-06-05: qty 5

## 2019-06-05 MED ORDER — ONDANSETRON HCL 4 MG/2ML IJ SOLN: 4.0000 mg | Freq: Four times a day (QID) | INTRAMUSCULAR | Status: DC | PRN

## 2019-06-05 MED ORDER — FAMOTIDINE 20 MG PO TABS
ORAL_TABLET | ORAL | Status: AC
  Administered 2019-06-05: 20 mg via ORAL
  Filled 2019-06-05: qty 1

## 2019-06-05 MED ORDER — CLINDAMYCIN PHOSPHATE 300 MG/50ML IV SOLN
INTRAVENOUS | Status: AC
  Administered 2019-06-05: 300 mg via INTRAVENOUS
  Filled 2019-06-05: qty 50

## 2019-06-05 MED ORDER — DIPHENHYDRAMINE HCL 50 MG/ML IJ SOLN
INTRAMUSCULAR | Status: AC
  Administered 2019-06-05: 50 mg via INTRAVENOUS
  Filled 2019-06-05: qty 1

## 2019-06-05 MED ORDER — MIDAZOLAM HCL 2 MG/2ML IJ SOLN
INTRAMUSCULAR | Status: DC | PRN
  Administered 2019-06-05: 2 mg via INTRAVENOUS

## 2019-06-05 MED ORDER — MIDAZOLAM HCL 2 MG/ML PO SYRP: 8.0000 mg | ORAL_SOLUTION | Freq: Once | ORAL | Status: DC | PRN

## 2019-06-05 MED ORDER — HYDROMORPHONE HCL 1 MG/ML IJ SOLN: 1.0000 mg | Freq: Once | INTRAMUSCULAR | Status: DC | PRN

## 2019-06-05 MED ORDER — FAMOTIDINE 20 MG PO TABS
40.0000 mg | ORAL_TABLET | Freq: Once | ORAL | Status: AC | PRN
  Administered 2019-06-05: 15:00:00 20 mg via ORAL

## 2019-06-05 SURGICAL SUPPLY — 17 items
BALLN ATG 12X4X80 (BALLOONS) ×2
BALLN DORADO 10X40X80 (BALLOONS) ×2
BALLN DORADO 8X40X80 (BALLOONS) ×2
BALLOON ATG 12X4X80 (BALLOONS) IMPLANT
BALLOON DORADO 10X40X80 (BALLOONS) IMPLANT
BALLOON DORADO 8X40X80 (BALLOONS) IMPLANT
COVER PROBE U/S 5X48 (MISCELLANEOUS) ×1 IMPLANT
DEVICE PRESTO INFLATION (MISCELLANEOUS) ×1 IMPLANT
DRAPE BRACHIAL (DRAPES) ×1 IMPLANT
NDL ENTRY 21GA 7CM ECHOTIP (NEEDLE) IMPLANT
NEEDLE ENTRY 21GA 7CM ECHOTIP (NEEDLE) ×2 IMPLANT
PACK ANGIOGRAPHY (CUSTOM PROCEDURE TRAY) ×2 IMPLANT
SET INTRO CAPELLA COAXIAL (SET/KITS/TRAYS/PACK) ×1 IMPLANT
SHEATH BRITE TIP 6FRX5.5 (SHEATH) ×1 IMPLANT
SHEATH BRITE TIP 7FRX5.5 (SHEATH) ×1 IMPLANT
SUT MNCRL AB 4-0 PS2 18 (SUTURE) ×1 IMPLANT
WIRE MAGIC TOR.035 180C (WIRE) ×1 IMPLANT

## 2019-06-05 NOTE — Discharge Instructions (Signed)
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
## 2019-06-05 NOTE — Op Note (Signed)
OPERATIVE NOTE   PROCEDURE: 1. Contrast injection left upper arm AV access 2. Percutaneous transluminal angioplasty peripheral segment to 8 mm 3. Percutaneous transluminal angioplasty central venous segment secondary to in-stent restenosis to 12 mm  PRE-OPERATIVE DIAGNOSIS: Complication of dialysis access                                                       End Stage Renal Disease  POST-OPERATIVE DIAGNOSIS: same as above   SURGEON: Katha Cabal, M.D.  ANESTHESIA: Conscious sedation was administered under my direct supervision by the interventional radiology RN. IV Versed plus fentanyl were utilized. Continuous ECG, pulse oximetry and blood pressure was monitored throughout the entire procedure.  Conscious sedation was for a total of 30 minutes.  ESTIMATED BLOOD LOSS: minimal  FINDING(S): Stricture of the AV graft within the peripheral segment as well as a stricture within the central venous portion  SPECIMEN(S):  None  CONTRAST: 35 cc  FLUOROSCOPY TIME: 2.7 minutes  INDICATIONS: Dennis Walker is a 68 y.o. male who  presents with malfunctioning left arm AV access.  The patient is scheduled for angiography with possible intervention of the AV access.  The patient is aware the risks include but are not limited to: bleeding, infection, thrombosis of the cannulated access, and possible anaphylactic reaction to the contrast.  The patient acknowledges if the access can not be salvaged a tunneled catheter will be needed and will be placed during this procedure.  The patient is aware of the risks of the procedure and elects to proceed with the angiogram and intervention.  DESCRIPTION: After full informed written consent was obtained, the patient was brought back to the Special Procedure suite and placed supine position.  Appropriate cardiopulmonary monitors were placed.  The left arm was prepped and draped in the standard fashion.  Appropriate timeout is called. The left brachial  axillary AV graft was cannulated with a micropuncture needle.  Cannulation was performed with ultrasound guidance. Ultrasound was placed in a sterile sleeve, the AV access was interrogated and noted to be echolucent and compressible indicating patency. Image was recorded for the permanent record. The puncture is performed under continuous ultrasound visualization.   The microwire was advanced and the needle was exchanged for  a microsheath.  The J-wire was then advanced and a 6 Fr sheath inserted.  Hand injections were completed to image the access from the arterial anastomosis through the entire access.  The central venous structures were also imaged by hand injections.  Based on the images,  3000 units of heparin was given and a wire was negotiated through the strictures within the venous portion of the graft as well as the central stenosis. The sheath was then upsized to a 7 [place] sheath.  Initially a 10 mm x 40 mm [place] balloon was used to treat the in-stent restenosis inflation was to 18 atm for 1 minute.  Next a 12 x 40 Atlas balloon was used.  Inflation was to 20 atm for 1 minute.  Follow-up imaging demonstrated less than 15% residual in-stent restenosis the detector was then repositioned over the peripheral portion of the AV access and 8 mm x 40 mm [place] balloon was used to treat the stricture within the AV access. Inflation was to 20 atm for 1 minutes.  Follow-up imaging demonstrates significant improvement  with a marked reduction of the strictures and less than 15% residual stenosis at both locations.  There is now rapid flow of contrast through the graft and the central veins.   A 4-0 Monocryl purse-string suture was sewn around the sheath.  The sheath was removed and light pressure was applied.  A sterile bandage was applied to the puncture site.    COMPLICATIONS: None  CONDITION: Dennis Walker, M.D [facility] Office: [phone]  06/05/2019 3:49 PM

## 2019-06-06 ENCOUNTER — Encounter: Payer: Self-pay | Admitting: Vascular Surgery

## 2019-06-24 ENCOUNTER — Other Ambulatory Visit (INDEPENDENT_AMBULATORY_CARE_PROVIDER_SITE_OTHER): Payer: Self-pay | Admitting: Vascular Surgery

## 2019-06-24 DIAGNOSIS — N186 End stage renal disease: Secondary | ICD-10-CM

## 2019-06-26 ENCOUNTER — Ambulatory Visit (INDEPENDENT_AMBULATORY_CARE_PROVIDER_SITE_OTHER): Payer: Medicare Other

## 2019-06-26 ENCOUNTER — Encounter (INDEPENDENT_AMBULATORY_CARE_PROVIDER_SITE_OTHER): Payer: Self-pay | Admitting: Nurse Practitioner

## 2019-06-26 ENCOUNTER — Other Ambulatory Visit: Payer: Self-pay

## 2019-06-26 ENCOUNTER — Ambulatory Visit (INDEPENDENT_AMBULATORY_CARE_PROVIDER_SITE_OTHER): Payer: Medicare Other | Admitting: Nurse Practitioner

## 2019-06-26 ENCOUNTER — Telehealth (INDEPENDENT_AMBULATORY_CARE_PROVIDER_SITE_OTHER): Payer: Self-pay

## 2019-06-26 VITALS — BP 150/79 | HR 64 | Resp 15 | Wt 169.0 lb

## 2019-06-26 DIAGNOSIS — N186 End stage renal disease: Secondary | ICD-10-CM

## 2019-06-26 DIAGNOSIS — I10 Essential (primary) hypertension: Secondary | ICD-10-CM | POA: Diagnosis not present

## 2019-06-26 DIAGNOSIS — E7800 Pure hypercholesterolemia, unspecified: Secondary | ICD-10-CM | POA: Diagnosis not present

## 2019-06-26 DIAGNOSIS — H548 Legal blindness, as defined in USA: Secondary | ICD-10-CM | POA: Insufficient documentation

## 2019-06-26 DIAGNOSIS — R809 Proteinuria, unspecified: Secondary | ICD-10-CM | POA: Insufficient documentation

## 2019-06-26 DIAGNOSIS — D631 Anemia in chronic kidney disease: Secondary | ICD-10-CM | POA: Insufficient documentation

## 2019-06-26 DIAGNOSIS — T82590A Other mechanical complication of surgically created arteriovenous fistula, initial encounter: Secondary | ICD-10-CM | POA: Insufficient documentation

## 2019-06-26 NOTE — Telephone Encounter (Signed)
Spoke with Dennis Walker at [employer] and we decided on the patient having his surgery on 07/17/19 with Dr. Delana Meyer. Unfortunately timewise I had to move it to 07/24/19 with Dr. Delana Meyer, Dennis Walker did not want a Friday. Patient will do pre-op on 07/15/19 phone call between 8-1 and covid testing on 07/22/19 between 12:30-2:30 pm at the [facility]. Pre-surgical instructions and times will be faxed to Kim at [facility].

## 2019-07-01 ENCOUNTER — Encounter (INDEPENDENT_AMBULATORY_CARE_PROVIDER_SITE_OTHER): Payer: Self-pay | Admitting: Nurse Practitioner
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
## 2019-07-15 ENCOUNTER — Other Ambulatory Visit: Payer: Self-pay

## 2019-07-15 ENCOUNTER — Encounter
Admission: RE | Admit: 2019-07-15 | Discharge: 2019-07-15 | Disposition: A | Discharge status: Home / Self Care | Payer: Medicare Other | Source: Ambulatory Visit | Attending: Vascular Surgery | Admitting: Vascular Surgery

## 2019-07-15 ENCOUNTER — Other Ambulatory Visit (INDEPENDENT_AMBULATORY_CARE_PROVIDER_SITE_OTHER): Payer: Self-pay | Admitting: Nurse Practitioner

## 2019-07-15 NOTE — Pre-Procedure Instructions (Signed)
Spoke to [employer] RN at [employer], provided instructions understanding verbalized. Patient to come to PAT for labs on 07/17/19, Covid test 07/23/19.

## 2019-07-15 NOTE — Patient Instructions (Addendum)
Your procedure is scheduled on: 07/24/19 Report to [place]. To find out your arrival time please call [phone] between 1PM - 3PM on 07/23/19.  Remember: Instructions that are not followed completely may result in serious medical risk, up to and including death, or upon the discretion of your surgeon and anesthesiologist your surgery may need to be rescheduled.     _X__ 1. Do not eat food after midnight the night before your procedure.                 No gum chewing or hard candies. You may drink clear liquids up to 2 hours                 before you are scheduled to arrive for your surgery- DO not drink clear                 liquids within 2 hours of the start of your surgery.                 Clear Liquids include:  water, apple juice without pulp, clear carbohydrate                 drink such as Clearfast or Gatorade, Black Coffee or Tea (Do not add                 anything to coffee or tea). Diabetics water only  __X__2.  On the morning of surgery brush your teeth with toothpaste and water, you                 may rinse your mouth with mouthwash if you wish.  Do not swallow any              toothpaste of mouthwash.     _X__ 3.  No Alcohol for 24 hours before or after surgery.   _X__ 4.  Do Not Smoke or use e-cigarettes For 24 Hours Prior to Your Surgery.                 Do not use any chewable tobacco products for at least 6 hours prior to                 surgery.  ____  5.  Bring all medications with you on the day of surgery if instructed.   __X__  6.  Notify your doctor if there is any change in your medical condition      (cold, fever, infections).     Do not wear jewelry, make-up, hairpins, clips or nail polish. Do not wear lotions, powders, or perfumes.  Do not shave 48 hours prior to surgery. Men may shave face and neck. Do not bring valuables to the hospital.    [facility] is not responsible for any belongings or  valuables.  Contacts, dentures/partials or body piercings may not be worn into surgery. Bring a case for your contacts, glasses or hearing aids, a denture cup will be supplied. Leave your suitcase in the car. After surgery it may be brought to your room. For patients admitted to the hospital, discharge time is determined by your treatment team.   Patients discharged the day of surgery will not be allowed to drive home.   Please read over the following fact sheets that you were given:   MRSA Information  __X__ Take these medicines the morning of surgery with A SIP OF WATER:  1. none  2.   3.   4.  5.  6.  ____ Fleet Enema (as directed)   __X__ Use CHG Soap/SAGE wipes as directed  ____ Use inhalers on the day of surgery  ____ Stop metformin/Janumet/Farxiga 2 days prior to surgery    ____ Take 1/2 of usual insulin dose the night before surgery. No insulin the morning          of surgery.   ____ Stop Blood Thinners Coumadin/Plavix/Xarelto/Pleta/Pradaxa/Eliquis/Effient/Aspirin  on   Or contact your Surgeon, Cardiologist or Medical Doctor regarding  ability to stop your blood thinners  __X__ Stop Anti-inflammatories 7 days before surgery such as Advil, Ibuprofen, Motrin,  BC or Goodies Powder, Naprosyn, Naproxen, Aleve   __X__ Stop all herbal supplements, fish oil or vitamin E until after surgery.    ____ Bring C-Pap to the hospital.

## 2019-07-16 ENCOUNTER — Telehealth (INDEPENDENT_AMBULATORY_CARE_PROVIDER_SITE_OTHER): Payer: Self-pay

## 2019-07-16 NOTE — Telephone Encounter (Signed)
Spoke with the receptionist at [employer] and explained that I needed to speak with someone who has the care of the patient and she wanted to take a message and give it to the nurse. I explained that the patient's surgery has been moved from 07/24/19 to 07/26/19 with Dr. Delana Meyer and that his covid testing and pre-op phone call will not change. The receptionist took my name and number to give to the nurse if she had questions.

## 2019-07-17 ENCOUNTER — Other Ambulatory Visit: Payer: Self-pay

## 2019-07-17 ENCOUNTER — Encounter
Admission: RE | Admit: 2019-07-17 | Discharge: 2019-07-17 | Disposition: A | Discharge status: Home / Self Care | Payer: Medicare Other | Source: Ambulatory Visit | Attending: Vascular Surgery | Admitting: Vascular Surgery

## 2019-07-17 DIAGNOSIS — N186 End stage renal disease: Secondary | ICD-10-CM | POA: Diagnosis not present

## 2019-07-17 DIAGNOSIS — Z01818 Encounter for other preprocedural examination: Secondary | ICD-10-CM | POA: Diagnosis present

## 2019-07-17 LAB — BASIC METABOLIC PANEL
Anion gap: 16 — ABNORMAL HIGH (ref 5–15)
BUN: 54 mg/dL — ABNORMAL HIGH (ref 8–23)
CO2: 27 mmol/L (ref 22–32)
Calcium: 8.5 mg/dL — ABNORMAL LOW (ref 8.9–10.3)
Chloride: 92 mmol/L — ABNORMAL LOW (ref 98–111)
Creatinine, Ser: 9.28 mg/dL — ABNORMAL HIGH (ref 0.61–1.24)
GFR calc Af Amer: 6 mL/min — ABNORMAL LOW (ref >60)
GFR calc non Af Amer: 5 mL/min — ABNORMAL LOW (ref >60)
Glucose, Bld: 147 mg/dL — ABNORMAL HIGH (ref 70–99)
Potassium: 4 mmol/L (ref 3.5–5.1)
Sodium: 135 mmol/L (ref 135–145)

## 2019-07-17 LAB — APTT: aPTT: 43 seconds — ABNORMAL HIGH (ref 24–36)

## 2019-07-17 LAB — CBC WITH DIFFERENTIAL/PLATELET
Abs Immature Granulocytes: 0.03 10*3/uL (ref 0.00–0.07)
Basophils Absolute: 0 10*3/uL (ref 0.0–0.1)
Basophils Relative: 0 %
Eosinophils Absolute: 0.2 10*3/uL (ref 0.0–0.5)
Eosinophils Relative: 2 %
HCT: 33.6 % — ABNORMAL LOW (ref 39.0–52.0)
Hemoglobin: 10.6 g/dL — ABNORMAL LOW (ref 13.0–17.0)
Immature Granulocytes: 0 %
Lymphocytes Relative: 13 %
Lymphs Abs: 1.2 10*3/uL (ref 0.7–4.0)
MCH: 29.3 pg (ref 26.0–34.0)
MCHC: 31.5 g/dL (ref 30.0–36.0)
MCV: 92.8 fL (ref 80.0–100.0)
Monocytes Absolute: 1.1 10*3/uL — ABNORMAL HIGH (ref 0.1–1.0)
Monocytes Relative: 12 %
Neutro Abs: 6.8 10*3/uL (ref 1.7–7.7)
Neutrophils Relative %: 73 %
Platelets: 140 10*3/uL — ABNORMAL LOW (ref 150–400)
RBC: 3.62 MIL/uL — ABNORMAL LOW (ref 4.22–5.81)
RDW: 15.3 % (ref 11.5–15.5)
WBC: 9.4 10*3/uL (ref 4.0–10.5)
nRBC: 0 % (ref 0.0–0.2)

## 2019-07-17 LAB — PROTIME-INR
INR: 1.5 — ABNORMAL HIGH (ref 0.8–1.2)
Prothrombin Time: 17.8 seconds — ABNORMAL HIGH (ref 11.4–15.2)

## 2019-07-17 LAB — TYPE AND SCREEN
ABO/RH(D): O POS
Antibody Screen: NEGATIVE

## 2019-07-17 NOTE — Anesthesia Preprocedure Evaluation (Addendum)
Anesthesia Evaluation  Patient identified by MRN, date of birth, ID band Patient awake    Reviewed: Allergy & Precautions, NPO status , Patient's Chart, lab work & pertinent test results  History of Anesthesia Complications Negative for: history of anesthetic complications  Airway Mallampati: II  TM Distance: >3 FB Neck ROM: Full    Dental  (+) Upper Dentures, Lower Dentures   Pulmonary asthma , neg sleep apnea,    breath sounds clear to auscultation- rhonchi (-) wheezing      Cardiovascular hypertension, + CAD, + Past MI, + Cardiac Stents, + Peripheral Vascular Disease and +CHF   Rhythm:Regular Rate:Normal - Systolic murmurs and - Diastolic murmurs    Neuro/Psych PSYCHIATRIC DISORDERS Anxiety Depression TIACVA (L sided weakness), Residual Symptoms    GI/Hepatic negative GI ROS, (+) Hepatitis -, C  Endo/Other  diabetes, Insulin Dependent  Renal/GU ESRF and DialysisRenal disease     Musculoskeletal  (+) Arthritis ,   Abdominal (+) - obese,   Peds  Hematology  (+) anemia ,   Anesthesia Other Findings Past Medical History: No date: Anginal pain ([place]) No date: Anxiety No date: Arthritis No date: Asthma No date: CHF (congestive heart failure) ([facility]) No date: Coronary artery disease No date: Depression No date: Diabetes mellitus without complication ([facility]) No date: Dyspnea No date: ESRD (end stage renal disease) ([place])     Comment:  On Tuesday, Thursday and Saturday dialysis No date: Heart murmur No date: Hyperlipidemia No date: Hypertension No date: Myocardial infarction ([place]) No date: Peripheral vascular disease ([place]) No date: Polio     Comment:  childhood No date: Stroke [facility])   Reproductive/Obstetrics                            Anesthesia Physical Anesthesia Plan  ASA: IV  Anesthesia Plan: General   Post-op Pain Management:    Induction: Intravenous  PONV Risk Score and  Plan: 1 and Ondansetron and Dexamethasone  Airway Management Planned: Oral ETT  Additional Equipment:   Intra-op Plan:   Post-operative Plan: Extubation in OR  Informed Consent: I have reviewed the patients History and Physical, chart, labs and discussed the procedure including the risks, benefits and alternatives for the proposed anesthesia with the patient or authorized representative who has indicated his/her understanding and acceptance.     Dental advisory given  Plan Discussed with: CRNA and Anesthesiologist  Anesthesia Plan Comments:         Anesthesia Quick Evaluation

## 2019-07-17 NOTE — Pre-Procedure Instructions (Signed)
EKG reviewed by anesthesiologist via secure chat. Patient denied any symptoms.   Me: Dennis Walker can you look at his EKG and advise. TY   Dr Lubertha Basque: I think this EKG looks pretty consistent with his previous. Unless he is having worrisome cardiac symptoms I think we're ok to proceed. thanks

## 2019-07-22 ENCOUNTER — Other Ambulatory Visit: Admission: RE | Admit: 2019-07-22 | Payer: Medicare Other | Source: Ambulatory Visit

## 2019-07-25 ENCOUNTER — Other Ambulatory Visit
Admission: RE | Admit: 2019-07-25 | Discharge: 2019-07-25 | Disposition: A | Discharge status: Home / Self Care | Payer: Medicare Other | Source: Ambulatory Visit | Attending: Vascular Surgery | Admitting: Vascular Surgery

## 2019-07-25 ENCOUNTER — Telehealth (INDEPENDENT_AMBULATORY_CARE_PROVIDER_SITE_OTHER): Payer: Self-pay

## 2019-07-25 DIAGNOSIS — Z20822 Contact with and (suspected) exposure to covid-19: Secondary | ICD-10-CM | POA: Insufficient documentation

## 2019-07-25 DIAGNOSIS — Z01812 Encounter for preprocedural laboratory examination: Secondary | ICD-10-CM | POA: Diagnosis present

## 2019-07-25 LAB — SARS CORONAVIRUS 2 (TAT 6-24 HRS): SARS Coronavirus 2: NEGATIVE

## 2019-07-25 NOTE — Telephone Encounter (Signed)
Sabrina at [employer] called to let us know that they did not stop the patient's Plavix and what to do. Per Dr. Delana Meyer the patient will be rescheduled for surgery. Patient has been rescheduled to 08/07/19 with Dr. Delana Meyer and will do covid testing on 08/05/19 between 12:30-2:30 pm at the [facility].

## 2019-07-31 NOTE — Telephone Encounter (Signed)
Kim from [employer] called asking about the patients Plavix, I informed her that the patient is to stop the Plavix 5 days before his surgery.

## 2019-08-05 ENCOUNTER — Other Ambulatory Visit
Admission: RE | Admit: 2019-08-05 | Discharge: 2019-08-05 | Disposition: A | Discharge status: Home / Self Care | Payer: Medicare Other | Source: Ambulatory Visit | Attending: Vascular Surgery | Admitting: Vascular Surgery

## 2019-08-05 ENCOUNTER — Other Ambulatory Visit (INDEPENDENT_AMBULATORY_CARE_PROVIDER_SITE_OTHER): Payer: Self-pay | Admitting: Nurse Practitioner

## 2019-08-05 ENCOUNTER — Other Ambulatory Visit: Payer: Self-pay

## 2019-08-05 DIAGNOSIS — Z20822 Contact with and (suspected) exposure to covid-19: Secondary | ICD-10-CM | POA: Insufficient documentation

## 2019-08-05 DIAGNOSIS — Z01812 Encounter for preprocedural laboratory examination: Secondary | ICD-10-CM | POA: Diagnosis present

## 2019-08-06 LAB — SARS CORONAVIRUS 2 (TAT 6-24 HRS): SARS Coronavirus 2: NEGATIVE

## 2019-08-06 MED ORDER — CLINDAMYCIN PHOSPHATE 300 MG/50ML IV SOLN
300.0000 mg | INTRAVENOUS | Status: AC
  Administered 2019-08-07: 300 mg via INTRAVENOUS

## 2019-08-07 ENCOUNTER — Ambulatory Visit
Admission: RE | Admit: 2019-08-07 | Discharge: 2019-08-07 | Disposition: A | Discharge status: Skilled Nursing Facility | Payer: Medicare Other | Attending: Vascular Surgery | Admitting: Vascular Surgery

## 2019-08-07 ENCOUNTER — Ambulatory Visit: Payer: Medicare Other | Admitting: Anesthesiology

## 2019-08-07 ENCOUNTER — Other Ambulatory Visit: Payer: Self-pay

## 2019-08-07 ENCOUNTER — Encounter: Payer: Self-pay | Admitting: Vascular Surgery

## 2019-08-07 ENCOUNTER — Encounter
Admission: RE | Disposition: A | Discharge status: Skilled Nursing Facility | Payer: Self-pay | Source: Home / Self Care | Attending: Vascular Surgery

## 2019-08-07 DIAGNOSIS — Z881 Allergy status to other antibiotic agents status: Secondary | ICD-10-CM | POA: Diagnosis not present

## 2019-08-07 DIAGNOSIS — F329 Major depressive disorder, single episode, unspecified: Secondary | ICD-10-CM | POA: Insufficient documentation

## 2019-08-07 DIAGNOSIS — Z882 Allergy status to sulfonamides status: Secondary | ICD-10-CM | POA: Diagnosis not present

## 2019-08-07 DIAGNOSIS — I252 Old myocardial infarction: Secondary | ICD-10-CM | POA: Diagnosis not present

## 2019-08-07 DIAGNOSIS — E1151 Type 2 diabetes mellitus with diabetic peripheral angiopathy without gangrene: Secondary | ICD-10-CM | POA: Insufficient documentation

## 2019-08-07 DIAGNOSIS — Z992 Dependence on renal dialysis: Secondary | ICD-10-CM | POA: Insufficient documentation

## 2019-08-07 DIAGNOSIS — F419 Anxiety disorder, unspecified: Secondary | ICD-10-CM | POA: Insufficient documentation

## 2019-08-07 DIAGNOSIS — Z8673 Personal history of transient ischemic attack (TIA), and cerebral infarction without residual deficits: Secondary | ICD-10-CM | POA: Insufficient documentation

## 2019-08-07 DIAGNOSIS — I509 Heart failure, unspecified: Secondary | ICD-10-CM | POA: Insufficient documentation

## 2019-08-07 DIAGNOSIS — N186 End stage renal disease: Secondary | ICD-10-CM

## 2019-08-07 DIAGNOSIS — Y841 Kidney dialysis as the cause of abnormal reaction of the patient, or of later complication, without mention of misadventure at the time of the procedure: Secondary | ICD-10-CM | POA: Insufficient documentation

## 2019-08-07 DIAGNOSIS — T82868A Thrombosis of vascular prosthetic devices, implants and grafts, initial encounter: Secondary | ICD-10-CM | POA: Diagnosis present

## 2019-08-07 DIAGNOSIS — I25119 Atherosclerotic heart disease of native coronary artery with unspecified angina pectoris: Secondary | ICD-10-CM | POA: Diagnosis not present

## 2019-08-07 DIAGNOSIS — E1122 Type 2 diabetes mellitus with diabetic chronic kidney disease: Secondary | ICD-10-CM | POA: Insufficient documentation

## 2019-08-07 DIAGNOSIS — E785 Hyperlipidemia, unspecified: Secondary | ICD-10-CM | POA: Diagnosis not present

## 2019-08-07 DIAGNOSIS — I132 Hypertensive heart and chronic kidney disease with heart failure and with stage 5 chronic kidney disease, or end stage renal disease: Secondary | ICD-10-CM | POA: Insufficient documentation

## 2019-08-07 DIAGNOSIS — J45909 Unspecified asthma, uncomplicated: Secondary | ICD-10-CM | POA: Insufficient documentation

## 2019-08-07 HISTORY — PX: CENTRAL LINE INSERTION: CATH118232

## 2019-08-07 HISTORY — PX: AV FISTULA PLACEMENT: SHX1204

## 2019-08-07 LAB — POCT I-STAT, CHEM 8
BUN: 52 mg/dL — ABNORMAL HIGH (ref 8–23)
Calcium, Ion: 1.01 mmol/L — ABNORMAL LOW (ref 1.15–1.40)
Chloride: 97 mmol/L — ABNORMAL LOW (ref 98–111)
Creatinine, Ser: 9.8 mg/dL — ABNORMAL HIGH (ref 0.61–1.24)
Glucose, Bld: 126 mg/dL — ABNORMAL HIGH (ref 70–99)
HCT: 42 % (ref 39.0–52.0)
Hemoglobin: 14.3 g/dL (ref 13.0–17.0)
Potassium: 4.2 mmol/L (ref 3.5–5.1)
Sodium: 135 mmol/L (ref 135–145)
TCO2: 28 mmol/L (ref 22–32)

## 2019-08-07 LAB — TYPE AND SCREEN
ABO/RH(D): O POS
Antibody Screen: NEGATIVE

## 2019-08-07 LAB — GLUCOSE, CAPILLARY
Glucose-Capillary: 123 mg/dL — ABNORMAL HIGH (ref 70–99)
Glucose-Capillary: 134 mg/dL — ABNORMAL HIGH (ref 70–99)

## 2019-08-07 SURGERY — INSERTION OF ARTERIOVENOUS (AV) GORE-TEX GRAFT ARM
Anesthesia: General | Site: Groin | Laterality: Right

## 2019-08-07 MED ORDER — SODIUM CHLORIDE 0.9 % IV SOLN
INTRAVENOUS | Status: DC | PRN
  Administered 2019-08-07: 20 ug via INTRAVENOUS

## 2019-08-07 MED ORDER — NEOSTIGMINE METHYLSULFATE 10 MG/10ML IV SOLN
INTRAVENOUS | Status: DC | PRN
  Administered 2019-08-07: 1 mg via INTRAVENOUS
  Administered 2019-08-07: 4 mg via INTRAVENOUS
  Administered 2019-08-07 (×2): 1 mg via INTRAVENOUS

## 2019-08-07 MED ORDER — OXYCODONE-ACETAMINOPHEN 5-325 MG PO TABS: 1.0000 | ORAL_TABLET | Freq: Four times a day (QID) | ORAL | 0 refills | Status: AC | PRN

## 2019-08-07 MED ORDER — LIDOCAINE HCL (PF) 2 % IJ SOLN
INTRAMUSCULAR | Status: AC
  Filled 2019-08-07: qty 10

## 2019-08-07 MED ORDER — HEPARIN SODIUM (PORCINE) 5000 UNIT/ML IJ SOLN
INTRAMUSCULAR | Status: AC
  Filled 2019-08-07: qty 1

## 2019-08-07 MED ORDER — BUPIVACAINE LIPOSOME 1.3 % IJ SUSP
INTRAMUSCULAR | Status: AC
  Filled 2019-08-07: qty 20

## 2019-08-07 MED ORDER — FAMOTIDINE 20 MG PO TABS
ORAL_TABLET | ORAL | Status: AC
  Filled 2019-08-07: qty 1

## 2019-08-07 MED ORDER — BUPIVACAINE HCL (PF) 0.5 % IJ SOLN
INTRAMUSCULAR | Status: AC
  Filled 2019-08-07: qty 30

## 2019-08-07 MED ORDER — SODIUM CHLORIDE 0.9 % IV SOLN: INTRAVENOUS | Status: DC

## 2019-08-07 MED ORDER — PROPOFOL 10 MG/ML IV BOLUS
INTRAVENOUS | Status: DC | PRN
  Administered 2019-08-07: 100 mg via INTRAVENOUS

## 2019-08-07 MED ORDER — DEXAMETHASONE SODIUM PHOSPHATE 10 MG/ML IJ SOLN
INTRAMUSCULAR | Status: DC | PRN
  Administered 2019-08-07: 5 mg via INTRAVENOUS

## 2019-08-07 MED ORDER — SODIUM CHLORIDE 0.9 % IV SOLN
INTRAVENOUS | Status: DC | PRN
  Administered 2019-08-07: 500 mL via INTRAMUSCULAR

## 2019-08-07 MED ORDER — FENTANYL CITRATE (PF) 100 MCG/2ML IJ SOLN: 25.0000 ug | INTRAMUSCULAR | Status: DC | PRN

## 2019-08-07 MED ORDER — VASOPRESSIN 20 UNIT/ML IV SOLN
INTRAVENOUS | Status: DC | PRN
  Administered 2019-08-07: 2 m[IU] via INTRAVENOUS
  Administered 2019-08-07 (×13): 1 m[IU] via INTRAVENOUS

## 2019-08-07 MED ORDER — GLYCOPYRROLATE 0.2 MG/ML IJ SOLN
INTRAMUSCULAR | Status: DC | PRN
  Administered 2019-08-07: .6 mg via INTRAVENOUS

## 2019-08-07 MED ORDER — PAPAVERINE HCL 30 MG/ML IJ SOLN
INTRAMUSCULAR | Status: AC
  Filled 2019-08-07: qty 2

## 2019-08-07 MED ORDER — VASOPRESSIN 20 UNIT/ML IV SOLN
INTRAVENOUS | Status: AC
  Filled 2019-08-07: qty 1

## 2019-08-07 MED ORDER — LIDOCAINE HCL (CARDIAC) PF 100 MG/5ML IV SOSY
PREFILLED_SYRINGE | INTRAVENOUS | Status: DC | PRN
  Administered 2019-08-07: 100 mg via INTRAVENOUS

## 2019-08-07 MED ORDER — ONDANSETRON HCL 4 MG/2ML IJ SOLN
INTRAMUSCULAR | Status: DC | PRN
  Administered 2019-08-07: 4 mg via INTRAVENOUS

## 2019-08-07 MED ORDER — PHENYLEPHRINE HCL (PRESSORS) 10 MG/ML IV SOLN
INTRAVENOUS | Status: AC
  Filled 2019-08-07: qty 1

## 2019-08-07 MED ORDER — SODIUM CHLORIDE 0.9 % IV BOLUS
250.0000 mL | Freq: Once | INTRAVENOUS | Status: AC
  Administered 2019-08-07: 250 mL via INTRAVENOUS

## 2019-08-07 MED ORDER — HEMOSTATIC AGENTS (NO CHARGE) OPTIME
TOPICAL | Status: DC | PRN
  Administered 2019-08-07: 1 via TOPICAL

## 2019-08-07 MED ORDER — FENTANYL CITRATE (PF) 100 MCG/2ML IJ SOLN
INTRAMUSCULAR | Status: DC | PRN
  Administered 2019-08-07: 50 ug via INTRAVENOUS

## 2019-08-07 MED ORDER — FENTANYL CITRATE (PF) 100 MCG/2ML IJ SOLN
INTRAMUSCULAR | Status: AC
  Filled 2019-08-07: qty 2

## 2019-08-07 MED ORDER — NEOSTIGMINE METHYLSULFATE 10 MG/10ML IV SOLN
INTRAVENOUS | Status: AC
  Filled 2019-08-07: qty 1

## 2019-08-07 MED ORDER — LACTATED RINGERS IV SOLN: INTRAVENOUS | Status: DC | PRN

## 2019-08-07 MED ORDER — PHENYLEPHRINE HCL (PRESSORS) 10 MG/ML IV SOLN
INTRAVENOUS | Status: DC | PRN
  Administered 2019-08-07: 50 ug via INTRAVENOUS
  Administered 2019-08-07 (×5): 100 ug via INTRAVENOUS
  Administered 2019-08-07 (×3): 50 ug via INTRAVENOUS
  Administered 2019-08-07 (×4): 100 ug via INTRAVENOUS
  Administered 2019-08-07 (×2): 50 ug via INTRAVENOUS

## 2019-08-07 MED ORDER — CHLORHEXIDINE GLUCONATE CLOTH 2 % EX PADS
6.0000 | MEDICATED_PAD | Freq: Once | CUTANEOUS | Status: AC
  Administered 2019-08-07: 11:00:00 6 via TOPICAL

## 2019-08-07 MED ORDER — BUPIVACAINE LIPOSOME 1.3 % IJ SUSP
INTRAMUSCULAR | Status: DC | PRN
  Administered 2019-08-07: 20 mL

## 2019-08-07 MED ORDER — ONDANSETRON HCL 4 MG/2ML IJ SOLN: 4.0000 mg | Freq: Once | INTRAMUSCULAR | Status: DC | PRN

## 2019-08-07 MED ORDER — CLINDAMYCIN PHOSPHATE 300 MG/50ML IV SOLN
INTRAVENOUS | Status: AC
  Filled 2019-08-07: qty 50

## 2019-08-07 MED ORDER — FAMOTIDINE 20 MG PO TABS
20.0000 mg | ORAL_TABLET | Freq: Once | ORAL | Status: AC
  Administered 2019-08-07: 11:00:00 20 mg via ORAL

## 2019-08-07 MED ORDER — BUPIVACAINE HCL (PF) 0.5 % IJ SOLN
INTRAMUSCULAR | Status: DC | PRN
  Administered 2019-08-07: 20 mL

## 2019-08-07 MED ORDER — EPHEDRINE SULFATE 50 MG/ML IJ SOLN
INTRAMUSCULAR | Status: AC
  Filled 2019-08-07: qty 1

## 2019-08-07 MED ORDER — ROCURONIUM BROMIDE 100 MG/10ML IV SOLN
INTRAVENOUS | Status: DC | PRN
  Administered 2019-08-07: 10 mg via INTRAVENOUS
  Administered 2019-08-07: 40 mg via INTRAVENOUS

## 2019-08-07 MED ORDER — ROCURONIUM BROMIDE 50 MG/5ML IV SOLN
INTRAVENOUS | Status: AC
  Filled 2019-08-07: qty 1

## 2019-08-07 MED ORDER — SODIUM CHLORIDE 0.9 % IV SOLN
0.0000 ug/min | INTRAVENOUS | Status: DC
  Filled 2019-08-07: qty 1

## 2019-08-07 MED ORDER — EPHEDRINE SULFATE 50 MG/ML IJ SOLN
INTRAMUSCULAR | Status: DC | PRN
  Administered 2019-08-07: 10 mg via INTRAVENOUS
  Administered 2019-08-07 (×6): 5 mg via INTRAVENOUS
  Administered 2019-08-07: 10 mg via INTRAVENOUS
  Administered 2019-08-07: 5 mg via INTRAVENOUS

## 2019-08-07 SURGICAL SUPPLY — 64 items
APPLIER CLIP 11 MED OPEN (CLIP)
APPLIER CLIP 9.375 SM OPEN (CLIP)
BAG COUNTER SPONGE EZ (MISCELLANEOUS) IMPLANT
BAG DECANTER FOR FLEXI CONT (MISCELLANEOUS) ×4 IMPLANT
BLADE SURG SZ11 CARB STEEL (BLADE) ×4 IMPLANT
BOOT SUTURE AID YELLOW STND (SUTURE) ×4 IMPLANT
BRUSH SCRUB EZ  4% CHG (MISCELLANEOUS) ×2
BRUSH SCRUB EZ 4% CHG (MISCELLANEOUS) ×2 IMPLANT
CANISTER SUCT 1200ML W/VALVE (MISCELLANEOUS) IMPLANT
CHLORAPREP W/TINT 26 (MISCELLANEOUS) ×4 IMPLANT
CLIP APPLIE 11 MED OPEN (CLIP) IMPLANT
CLIP APPLIE 9.375 SM OPEN (CLIP) IMPLANT
COUNTER SPONGE BAG EZ (MISCELLANEOUS)
COVER PROBE FLX POLY STRL (MISCELLANEOUS) ×4 IMPLANT
COVER WAND RF STERILE (DRAPES) IMPLANT
DERMABOND ADVANCED (GAUZE/BANDAGES/DRESSINGS) ×2
DERMABOND ADVANCED .7 DNX12 (GAUZE/BANDAGES/DRESSINGS) ×2 IMPLANT
DRESSING SURGICEL FIBRLLR 1X2 (HEMOSTASIS) ×2 IMPLANT
DRSG SURGICEL FIBRILLAR 1X2 (HEMOSTASIS) ×4
ELECT CAUTERY BLADE 6.4 (BLADE) ×4 IMPLANT
ELECT REM PT RETURN 9FT ADLT (ELECTROSURGICAL)
ELECTRODE REM PT RTRN 9FT ADLT (ELECTROSURGICAL) IMPLANT
GLOVE BIO SURGEON STRL SZ7 (GLOVE) IMPLANT
GLOVE INDICATOR 7.5 STRL GRN (GLOVE) ×4 IMPLANT
GLOVE SURG SYN 8.0 (GLOVE) ×4 IMPLANT
GOWN STRL REUS W/ TWL LRG LVL3 (GOWN DISPOSABLE) ×2 IMPLANT
GOWN STRL REUS W/ TWL XL LVL3 (GOWN DISPOSABLE) ×2 IMPLANT
GOWN STRL REUS W/TWL LRG LVL3 (GOWN DISPOSABLE) ×2
GOWN STRL REUS W/TWL XL LVL3 (GOWN DISPOSABLE) ×2
GRAFT PROPATEN STD WALL 4 7X45 (Vascular Products) ×4 IMPLANT
IV CONNECTOR ONE LINK NDLESS (IV SETS) ×12 IMPLANT
IV NS 500ML (IV SOLUTION) ×2
IV NS 500ML BAXH (IV SOLUTION) ×2 IMPLANT
KIT CATH CVC 3 LUMEN 7FR 8IN (MISCELLANEOUS) ×4 IMPLANT
KIT TURNOVER KIT A (KITS) ×4 IMPLANT
LABEL OR SOLS (LABEL) IMPLANT
LOOP RED MAXI  1X406MM (MISCELLANEOUS) ×2
LOOP VESSEL MAXI 1X406 RED (MISCELLANEOUS) ×2 IMPLANT
LOOP VESSEL MINI 0.8X406 BLUE (MISCELLANEOUS) ×6 IMPLANT
LOOPS BLUE MINI 0.8X406MM (MISCELLANEOUS) ×6
NEEDLE FILTER BLUNT 18X 1/2SAF (NEEDLE)
NEEDLE FILTER BLUNT 18X1 1/2 (NEEDLE) IMPLANT
NEEDLE SPNL 20GX3.5 QUINCKE YW (NEEDLE) ×4 IMPLANT
NS IRRIG 500ML POUR BTL (IV SOLUTION) ×4 IMPLANT
PACK EXTREMITY ARMC (MISCELLANEOUS) ×4 IMPLANT
PAD PREP 24X41 OB/GYN DISP (PERSONAL CARE ITEMS) IMPLANT
STOCKINETTE STRL 4IN 9604848 (GAUZE/BANDAGES/DRESSINGS) ×4 IMPLANT
SUT GTX CV-6 30 (SUTURE) ×8 IMPLANT
SUT GTX CV-7 30 3/8 TAPER (SUTURE) ×4 IMPLANT
SUT MNCRL+ 5-0 UNDYED PC-3 (SUTURE) ×2 IMPLANT
SUT MONOCRYL 5-0 (SUTURE) ×2
SUT PROLENE 6 0 BV (SUTURE) ×8 IMPLANT
SUT PROLENE 7 0 BV 1 (SUTURE) ×8 IMPLANT
SUT SILK 2 0 (SUTURE) ×2
SUT SILK 2 0 SH (SUTURE) ×4 IMPLANT
SUT SILK 2-0 18XBRD TIE 12 (SUTURE) ×2 IMPLANT
SUT SILK 3 0 (SUTURE) ×2
SUT SILK 3-0 18XBRD TIE 12 (SUTURE) ×2 IMPLANT
SUT SILK 4 0 (SUTURE) ×2
SUT SILK 4-0 18XBRD TIE 12 (SUTURE) ×2 IMPLANT
SUT VIC AB 3-0 SH 27 (SUTURE) ×2
SUT VIC AB 3-0 SH 27X BRD (SUTURE) ×2 IMPLANT
SYR 20ML LL LF (SYRINGE) ×4 IMPLANT
SYR 3ML LL SCALE MARK (SYRINGE) ×4 IMPLANT

## 2019-08-07 NOTE — Transfer of Care (Signed)
Immediate Anesthesia Transfer of Care Note  Patient: Dennis Walker  Procedure(s) Performed: INSERTION OF ARTERIOVENOUS (AV) GORE-TEX GRAFT ARM (BRACHIAL AXILLARY) (Right Arm Upper) CENTRAL LINE INSERTION (Right Groin)  Patient Location: PACU  Anesthesia Type:General  Level of Consciousness: awake and drowsy  Airway & Oxygen Therapy: Patient Spontanous Breathing and Patient connected to face mask oxygen  Post-op Assessment: Report given to RN and Post -op Vital signs reviewed and stable  Post vital signs: Reviewed and stable  Last Vitals:  Vitals Value Taken Time  BP 127/43 08/07/19 1515  Temp 36.1 C 08/07/19 1515  Pulse 73 08/07/19 1520  Resp 19 08/07/19 1520  SpO2 100 % 08/07/19 1520  Vitals shown include unvalidated device data.  Last Pain:  Vitals:   08/07/19 1515  TempSrc:   PainSc: Asleep         Complications: No apparent anesthesia complications

## 2019-08-07 NOTE — Progress Notes (Signed)
Spoke to Dr. Delana Meyer about dressing for femoral central line after removal.  Dr. Delana Meyer wanted a dressing placed with gauze and foam tape to form pressure dressing.  Femoral line removed held pressure for 5 minutes. Dressing applied. Pt bled through 1st dressing. Pressure held for another 5 minutes. New pressure dressing applied with no bleeding prior to discharge.

## 2019-08-07 NOTE — OR Nursing (Signed)
Per anesthesia doctor [place] okay to place IV in left foot; patient surgery is to be performed on right arm; dialysis catheter presently being used is in the left arm. Patient is also BKA to the right leg.

## 2019-08-07 NOTE — Anesthesia Postprocedure Evaluation (Signed)
Anesthesia Post Note  Patient: Dennis Walker  Procedure(s) Performed: INSERTION OF ARTERIOVENOUS (AV) GORE-TEX GRAFT ARM (BRACHIAL AXILLARY) (Right Arm Upper) CENTRAL LINE INSERTION (Right Groin)  Patient location during evaluation: PACU Anesthesia Type: General Level of consciousness: awake and alert Pain management: pain level controlled Vital Signs Assessment: post-procedure vital signs reviewed and stable Respiratory status: spontaneous breathing and respiratory function stable Cardiovascular status: stable Anesthetic complications: no     Last Vitals:  Vitals:   08/07/19 1041 08/07/19 1515  BP: 135/82 (!) 127/43  Pulse: (!) 59 75  Resp: 16 (!) 28  Temp: 36.6 C (!) 36.1 C  SpO2: 100% 97%    Last Pain:  Vitals:   08/07/19 1515  TempSrc:   PainSc: Asleep                 Lamari Beckles K

## 2019-08-07 NOTE — Discharge Instructions (Signed)
AMBULATORY SURGERY  °DISCHARGE INSTRUCTIONS ° ° °1) The drugs that you were given will stay in your system until tomorrow so for the next 24 hours you should not: ° °A) Drive an automobile °B) Make any legal decisions °C) Drink any alcoholic beverage ° ° °2) You may resume regular meals tomorrow.  Today it is better to start with liquids and gradually work up to solid foods. ° °You may eat anything you prefer, but it is better to start with liquids, then soup and crackers, and gradually work up to solid foods. ° ° °3) Please notify your doctor immediately if you have any unusual bleeding, trouble breathing, redness and pain at the surgery site, drainage, fever, or pain not relieved by medication. ° ° ° °4) Additional Instructions: ° ° ° ° ° ° ° °Please contact your physician with any problems or Same Day Surgery at [phone], Monday through Friday 6 am to 4 pm, or [place] at [place] Main number at [phone].AMBULATORY SURGERY  °DISCHARGE INSTRUCTIONS ° ° °5) The drugs that you were given will stay in your system until tomorrow so for the next 24 hours you should not: ° °D) Drive an automobile °E) Make any legal decisions °F) Drink any alcoholic beverage ° ° °6) You may resume regular meals tomorrow.  Today it is better to start with liquids and gradually work up to solid foods. ° °You may eat anything you prefer, but it is better to start with liquids, then soup and crackers, and gradually work up to solid foods. ° ° °7) Please notify your doctor immediately if you have any unusual bleeding, trouble breathing, redness and pain at the surgery site, drainage, fever, or pain not relieved by medication. ° ° ° °8) Additional Instructions: ° ° ° ° ° ° ° °Please contact your physician with any problems or Same Day Surgery at [phone], Monday through Friday 6 am to 4 pm, or [place] at [place] Main number at [phone]. °

## 2019-08-07 NOTE — Op Note (Signed)
OPERATIVE NOTE   PROCEDURE: 1. Insertion of triple-lumen central venous catheter right femoral approach.  PRE-OPERATIVE DIAGNOSIS: End-stage renal disease on hemodialysis requiring creation of a new access; lack of IV access  POST-OPERATIVE DIAGNOSIS: Same  SURGEON: Katha Cabal M.D.  ANESTHESIA: 1% lidocaine local infiltration  ESTIMATED BLOOD LOSS: Minimal cc  INDICATIONS:   Dennis Walker is a 69 y.o. male who presents with planned surgery for a right arm brachial axillary AV graft.  After multiple attempts we have been unable to secure a peripheral access.  He is therefore undergoing placement of a central line so that we may proceed with his surgery.  Risks and benefits were reviewed all are in agreement with proceeding..  DESCRIPTION: After obtaining full informed written consent, the patient was positioned supine. The right groin was prepped and draped in a sterile fashion. Ultrasound was placed in a sterile sleeve. Ultrasound was utilized to identify the right common femoral vein which is noted to be echolucent and compressible indicating patency. Images recorded for the permanent record. Under real-time visualization a Seldinger needle is inserted into the vein and the guidewires advanced without difficulty. Small counterincision was made at the wire insertion site. Dilators passed over the wire and the triple-lumen catheter is fed without difficulty.  All 3 lm aspirate and flush easily and are packed with heparin saline. Catheter secured to the skin of the right thigh with 2-0 silk. A sterile dressing is applied with Biopatch.  COMPLICATIONS: None  CONDITION: Good  Katha Cabal, M.D. [facility]. Office:  [phone]   08/07/2019, 12:48 PM

## 2019-08-07 NOTE — Op Note (Signed)
OPERATIVE NOTE   PROCEDURE: right brachial axillary arteriovenous graft placement  PRE-OPERATIVE DIAGNOSIS: End Stage Renal Disease  POST-OPERATIVE DIAGNOSIS: End Stage Renal Disease  SURGEON: Dennis Walker  ASSISTANT(S): Dennis Walker  ANESTHESIA: general  ESTIMATED BLOOD LOSS: <50 cc  FINDING(S): none  SPECIMEN(S):  none  INDICATIONS:   Dennis Walker is a 69 y.o. male who presents with end stage renal disease.  The patient is scheduled for right brachial axillary AV graft placement.  The patient is aware the risks include but are not limited to: bleeding, infection, steal syndrome, nerve damage, ischemic monomelic neuropathy, failure to mature, and need for additional procedures.  The patient is aware of the risks of the procedure and elects to proceed forward.  DESCRIPTION: After full informed written consent was obtained from the patient, the patient was brought back to the operating room and placed supine upon the operating table.  Prior to induction, the patient received IV antibiotics.   After obtaining adequate anesthesia, the patient was then prepped and draped in the standard fashion for a right arm access procedure.   A first assistant was required to provide a safe and appropriate environment for executing the surgery.  The assistant was integral in providing retraction, exposure, running suture providing suction and in the closing process.   A linear incision was then created along the medial border of the biceps muscle just proximal to the antecubital crease and the brachial artery which was exposed through. The brachial artery was then looped proximally and distally with Silastic Vesseloops. Side branches were controlled with 4-0 silk ties.  Attention was then turned to the exposure of the axillary vein. Linear incision was then created medial to the proximal portion of the biceps at the level of the anterior axillary crease. The axillary vein was exposed  and again looped proximally and distally with Silastic vessel loops. Associated tributaries were also controlled with Silastic Vesseloops.  The Gore tunneler was then delivered onto the Dennis and a subcutaneous path was made from the arterial incision to the venous incision. A 4-7 tapered PTFE propatent graft by Dennis Walker was then pulled through the subcutaneous tunnel. The arterial 4 mm portion was then approximated to the brachial artery. Brachial artery was controlled proximally and distally with the Silastic Vesseloops. Arteriotomy was made with an 11 blade scalpel and extended with Potts scissors and a 6-0 Prolene stay suture was placed. End graft to side brachial artery anastomosis was then fashioned with running CV 6 suture. Flushing maneuvers were performed suture line was hemostatic and the graft was then assessed for proper position and ease of future cannulation. Heparinized saline was infused into the vein and the graft was clamped with a Walker clamp. With the graft pressurized it was approximated to the axillary vein in its native bed and then marked with a surgical marker. The vein was then delivered into the surgical Dennis and controlled with the Silastic vessel loops. Venotomy was then made with an 11 blade scalpel and extended with Potts scissors and a 6-0 Prolene suture was used as stay suture. The the graft was then sewn to the vein in an end graft to side vein fashion using running CV 6 suture.  Flushing maneuvers were performed and the artery was allowed to forward and back bleed.  Flow was then established through the AV graft  There was good  thrill in the venous outflow, and there was 1+ palpable radial pulse.  At this point, I irrigated  out the surgical wounds.  There was no further active bleeding.  The subcutaneous tissue was reapproximated with a running stitch of 3-0 Vicryl.  The skin was then reapproximated with a running subcuticular stitch of 4-0 Vicryl.  The skin was then cleaned,  dried, and reinforced with Dermabond.    The patient tolerated this procedure well.   COMPLICATIONS: None  CONDITION: Dennis Walker  Office: [phone]   08/07/2019, 2:59 PM
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
## 2019-08-07 NOTE — Anesthesia Procedure Notes (Signed)
Procedure Name: Intubation Date/Time: 08/07/2019 12:51 PM Performed by: Lia Foyer, CRNA Pre-anesthesia Checklist: Patient identified, Emergency Drugs available, Suction available and Patient being monitored Patient Re-evaluated:Patient Re-evaluated prior to induction Oxygen Delivery Method: Circle system utilized Preoxygenation: Pre-oxygenation with 100% oxygen Induction Type: IV induction Ventilation: Mask ventilation without difficulty and Oral airway inserted - appropriate to patient size Tube type: Oral Tube size: 7.5 mm Number of attempts: 1 Airway Equipment and Method: Stylet and Oral airway Placement Confirmation: ETT inserted through vocal cords under direct vision,  positive ETCO2 and breath sounds checked- equal and bilateral Tube secured with: Tape Dental Injury: Teeth and Oropharynx as per pre-operative assessment

## 2019-08-07 NOTE — Addendum Note (Signed)
Addendum  created 08/07/19 1557 by Martha Clan, MD   Order list changed
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
## 2019-08-14 ENCOUNTER — Encounter: Payer: Self-pay | Admitting: Internal Medicine

## 2019-08-14 ENCOUNTER — Ambulatory Visit (INDEPENDENT_AMBULATORY_CARE_PROVIDER_SITE_OTHER): Payer: Medicare Other | Admitting: Internal Medicine

## 2019-08-14 ENCOUNTER — Telehealth: Payer: Self-pay | Admitting: *Deleted

## 2019-08-14 ENCOUNTER — Other Ambulatory Visit: Payer: Self-pay

## 2019-08-14 VITALS — BP 120/60 | HR 69 | Ht 71.0 in | Wt 189.0 lb

## 2019-08-14 DIAGNOSIS — N186 End stage renal disease: Secondary | ICD-10-CM

## 2019-08-14 DIAGNOSIS — I10 Essential (primary) hypertension: Secondary | ICD-10-CM | POA: Diagnosis not present

## 2019-08-14 DIAGNOSIS — I2510 Atherosclerotic heart disease of native coronary artery without angina pectoris: Secondary | ICD-10-CM | POA: Diagnosis not present

## 2019-08-14 DIAGNOSIS — E785 Hyperlipidemia, unspecified: Secondary | ICD-10-CM

## 2019-08-14 DIAGNOSIS — I739 Peripheral vascular disease, unspecified: Secondary | ICD-10-CM

## 2019-08-14 MED ORDER — ASPIRIN EC 81 MG PO TBEC: 81.0000 mg | DELAYED_RELEASE_TABLET | Freq: Every day | ORAL | 3 refills | Status: AC

## 2019-08-14 NOTE — Patient Instructions (Addendum)
Medication Instructions:  Your physician has recommended you make the following change in your medication:  1- START Aspirin 81 mg by mouth once a day.  *If you need a refill on your cardiac medications before your next appointment, please call your pharmacy*  Lab Work: Your physician recommends that you return for lab work in: TODAY - LIPID, ALT. - - Please go to the [facility]. You will check in at the front desk to the right as you walk into the atrium. Valet Parking is offered if needed. - No appointment needed. You may go any day between 7 am and 6 pm.  If you have labs (blood work) drawn today and your tests are completely normal, you will receive your results only by: Marland Kitchen MyChart Message (if you have MyChart) OR . A paper copy in the mail If you have any lab test that is abnormal or we need to change your treatment, we will call you to review the results.  Testing/Procedures: none  Follow-Up: At [facility], you and your health needs are our priority.  As part of our continuing mission to provide you with exceptional heart care, we have created designated Provider Care Teams.  These Care Teams include your primary Cardiologist (physician) and Advanced Practice Providers (APPs -  Physician Assistants and Nurse Practitioners) who all work together to provide you with the care you need, when you need it.  Your next appointment:   6 month(s)  The format for your next appointment:   In Person  Provider:    You may see . or one of the following Advanced Practice Providers on your designated Care Team:    Murray Hodgkins, NP  Christell Faith, PA-C  Marrianne Mood, [place]

## 2019-08-14 NOTE — Telephone Encounter (Signed)
Patient in the office today to see Dr End. Dr End ordered patient to have lipid and ALT blood work. Attempted to draw from patient 2 times. Unsuccessful. Patient has dialysis tomorrow. Called [facility] in [place] and spoke with Levie Heritage in Hematology dept. She will ask Dr Candiss Norse if ok to order but she did not see that it would be a problem. She advised me to call back no sooner than Friday to have the results faxed to [place]. I expressed my appreciation.

## 2019-08-15 ENCOUNTER — Encounter: Payer: Self-pay | Admitting: Internal Medicine

## 2019-08-15 ENCOUNTER — Telehealth (INDEPENDENT_AMBULATORY_CARE_PROVIDER_SITE_OTHER): Payer: Self-pay

## 2019-08-15 NOTE — Telephone Encounter (Signed)
RX has been fax to [phone]

## 2019-08-15 NOTE — Telephone Encounter (Signed)
On Desk

## 2019-08-20 NOTE — Telephone Encounter (Signed)
Called and spoke with Hematology at [facility]. She will fax the lab work to [place] today. Awaiting results.

## 2019-08-20 NOTE — Telephone Encounter (Signed)
Received and routed to Dr End for review.

## 2019-08-22 ENCOUNTER — Ambulatory Visit (INDEPENDENT_AMBULATORY_CARE_PROVIDER_SITE_OTHER): Payer: Medicare Other | Admitting: Vascular Surgery

## 2019-08-26 ENCOUNTER — Encounter (INDEPENDENT_AMBULATORY_CARE_PROVIDER_SITE_OTHER): Payer: Self-pay | Admitting: Nurse Practitioner

## 2019-08-26 ENCOUNTER — Other Ambulatory Visit: Payer: Self-pay

## 2019-08-26 ENCOUNTER — Ambulatory Visit (INDEPENDENT_AMBULATORY_CARE_PROVIDER_SITE_OTHER): Payer: Medicare Other | Admitting: Nurse Practitioner

## 2019-08-26 VITALS — BP 112/39 | HR 77 | Resp 14 | Ht 71.0 in

## 2019-08-26 DIAGNOSIS — Z794 Long term (current) use of insulin: Secondary | ICD-10-CM

## 2019-08-26 DIAGNOSIS — E1121 Type 2 diabetes mellitus with diabetic nephropathy: Secondary | ICD-10-CM

## 2019-08-26 DIAGNOSIS — N186 End stage renal disease: Secondary | ICD-10-CM | POA: Diagnosis not present

## 2019-08-26 DIAGNOSIS — E785 Hyperlipidemia, unspecified: Secondary | ICD-10-CM

## 2019-08-26 DIAGNOSIS — I2510 Atherosclerotic heart disease of native coronary artery without angina pectoris: Secondary | ICD-10-CM
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
## 2019-08-28 ENCOUNTER — Inpatient Hospital Stay
Admission: EM | Admit: 2019-08-28 | Discharge: 2019-09-05 | DRG: 853 | Disposition: A | Discharge status: Skilled Nursing Facility | Payer: Medicare Other | Attending: Internal Medicine | Admitting: Internal Medicine

## 2019-08-28 ENCOUNTER — Other Ambulatory Visit: Payer: Self-pay

## 2019-08-28 ENCOUNTER — Encounter: Payer: Self-pay | Admitting: Emergency Medicine

## 2019-08-28 ENCOUNTER — Emergency Department: Payer: Medicare Other

## 2019-08-28 DIAGNOSIS — I69354 Hemiplegia and hemiparesis following cerebral infarction affecting left non-dominant side: Secondary | ICD-10-CM

## 2019-08-28 DIAGNOSIS — I5042 Chronic combined systolic (congestive) and diastolic (congestive) heart failure: Secondary | ICD-10-CM | POA: Diagnosis present

## 2019-08-28 DIAGNOSIS — E861 Hypovolemia: Secondary | ICD-10-CM | POA: Diagnosis present

## 2019-08-28 DIAGNOSIS — M24549 Contracture, unspecified hand: Secondary | ICD-10-CM | POA: Diagnosis present

## 2019-08-28 DIAGNOSIS — Z882 Allergy status to sulfonamides status: Secondary | ICD-10-CM

## 2019-08-28 DIAGNOSIS — Z8612 Personal history of poliomyelitis: Secondary | ICD-10-CM

## 2019-08-28 DIAGNOSIS — Z89511 Acquired absence of right leg below knee: Secondary | ICD-10-CM

## 2019-08-28 DIAGNOSIS — I739 Peripheral vascular disease, unspecified: Secondary | ICD-10-CM | POA: Diagnosis not present

## 2019-08-28 DIAGNOSIS — Z7902 Long term (current) use of antithrombotics/antiplatelets: Secondary | ICD-10-CM

## 2019-08-28 DIAGNOSIS — M1990 Unspecified osteoarthritis, unspecified site: Secondary | ICD-10-CM | POA: Diagnosis present

## 2019-08-28 DIAGNOSIS — G9341 Metabolic encephalopathy: Secondary | ICD-10-CM | POA: Diagnosis present

## 2019-08-28 DIAGNOSIS — N2581 Secondary hyperparathyroidism of renal origin: Secondary | ICD-10-CM | POA: Diagnosis present

## 2019-08-28 DIAGNOSIS — D631 Anemia in chronic kidney disease: Secondary | ICD-10-CM | POA: Diagnosis present

## 2019-08-28 DIAGNOSIS — I132 Hypertensive heart and chronic kidney disease with heart failure and with stage 5 chronic kidney disease, or end stage renal disease: Secondary | ICD-10-CM | POA: Diagnosis present

## 2019-08-28 DIAGNOSIS — G8194 Hemiplegia, unspecified affecting left nondominant side: Secondary | ICD-10-CM | POA: Diagnosis not present

## 2019-08-28 DIAGNOSIS — I082 Rheumatic disorders of both aortic and tricuspid valves: Secondary | ICD-10-CM | POA: Diagnosis present

## 2019-08-28 DIAGNOSIS — Z515 Encounter for palliative care: Secondary | ICD-10-CM | POA: Diagnosis not present

## 2019-08-28 DIAGNOSIS — I5022 Chronic systolic (congestive) heart failure: Secondary | ICD-10-CM | POA: Diagnosis present

## 2019-08-28 DIAGNOSIS — Z7189 Other specified counseling: Secondary | ICD-10-CM | POA: Diagnosis not present

## 2019-08-28 DIAGNOSIS — I252 Old myocardial infarction: Secondary | ICD-10-CM

## 2019-08-28 DIAGNOSIS — E785 Hyperlipidemia, unspecified: Secondary | ICD-10-CM | POA: Diagnosis present

## 2019-08-28 DIAGNOSIS — Z8614 Personal history of Methicillin resistant Staphylococcus aureus infection: Secondary | ICD-10-CM

## 2019-08-28 DIAGNOSIS — Z7982 Long term (current) use of aspirin: Secondary | ICD-10-CM

## 2019-08-28 DIAGNOSIS — R6521 Severe sepsis with septic shock: Secondary | ICD-10-CM | POA: Diagnosis present

## 2019-08-28 DIAGNOSIS — F329 Major depressive disorder, single episode, unspecified: Secondary | ICD-10-CM | POA: Diagnosis present

## 2019-08-28 DIAGNOSIS — A419 Sepsis, unspecified organism: Secondary | ICD-10-CM | POA: Diagnosis present

## 2019-08-28 DIAGNOSIS — I959 Hypotension, unspecified: Secondary | ICD-10-CM

## 2019-08-28 DIAGNOSIS — R011 Cardiac murmur, unspecified: Secondary | ICD-10-CM | POA: Diagnosis present

## 2019-08-28 DIAGNOSIS — I120 Hypertensive chronic kidney disease with stage 5 chronic kidney disease or end stage renal disease: Secondary | ICD-10-CM | POA: Diagnosis not present

## 2019-08-28 DIAGNOSIS — R6520 Severe sepsis without septic shock: Secondary | ICD-10-CM | POA: Diagnosis present

## 2019-08-28 DIAGNOSIS — N179 Acute kidney failure, unspecified: Secondary | ICD-10-CM | POA: Diagnosis present

## 2019-08-28 DIAGNOSIS — Z20822 Contact with and (suspected) exposure to covid-19: Secondary | ICD-10-CM | POA: Diagnosis present

## 2019-08-28 DIAGNOSIS — L97529 Non-pressure chronic ulcer of other part of left foot with unspecified severity: Secondary | ICD-10-CM | POA: Diagnosis present

## 2019-08-28 DIAGNOSIS — Z7984 Long term (current) use of oral hypoglycemic drugs: Secondary | ICD-10-CM

## 2019-08-28 DIAGNOSIS — Z79899 Other long term (current) drug therapy: Secondary | ICD-10-CM

## 2019-08-28 DIAGNOSIS — Z992 Dependence on renal dialysis: Secondary | ICD-10-CM

## 2019-08-28 DIAGNOSIS — I9589 Other hypotension: Secondary | ICD-10-CM

## 2019-08-28 DIAGNOSIS — H547 Unspecified visual loss: Secondary | ICD-10-CM | POA: Diagnosis present

## 2019-08-28 DIAGNOSIS — E1122 Type 2 diabetes mellitus with diabetic chronic kidney disease: Secondary | ICD-10-CM | POA: Diagnosis present

## 2019-08-28 DIAGNOSIS — E1140 Type 2 diabetes mellitus with diabetic neuropathy, unspecified: Secondary | ICD-10-CM | POA: Diagnosis present

## 2019-08-28 DIAGNOSIS — I96 Gangrene, not elsewhere classified: Secondary | ICD-10-CM | POA: Diagnosis present

## 2019-08-28 DIAGNOSIS — J9601 Acute respiratory failure with hypoxia: Secondary | ICD-10-CM | POA: Diagnosis present

## 2019-08-28 DIAGNOSIS — N186 End stage renal disease: Secondary | ICD-10-CM | POA: Diagnosis present

## 2019-08-28 DIAGNOSIS — I70262 Atherosclerosis of native arteries of extremities with gangrene, left leg: Secondary | ICD-10-CM | POA: Diagnosis not present

## 2019-08-28 DIAGNOSIS — J45909 Unspecified asthma, uncomplicated: Secondary | ICD-10-CM | POA: Diagnosis present

## 2019-08-28 DIAGNOSIS — Z91013 Allergy to seafood: Secondary | ICD-10-CM

## 2019-08-28 DIAGNOSIS — R748 Abnormal levels of other serum enzymes: Secondary | ICD-10-CM | POA: Diagnosis present

## 2019-08-28 DIAGNOSIS — R7881 Bacteremia: Secondary | ICD-10-CM | POA: Diagnosis not present

## 2019-08-28 DIAGNOSIS — I10 Essential (primary) hypertension: Secondary | ICD-10-CM | POA: Diagnosis present

## 2019-08-28 DIAGNOSIS — F419 Anxiety disorder, unspecified: Secondary | ICD-10-CM | POA: Diagnosis present

## 2019-08-28 DIAGNOSIS — E1152 Type 2 diabetes mellitus with diabetic peripheral angiopathy with gangrene: Secondary | ICD-10-CM | POA: Diagnosis present

## 2019-08-28 DIAGNOSIS — I2510 Atherosclerotic heart disease of native coronary artery without angina pectoris: Secondary | ICD-10-CM | POA: Diagnosis present

## 2019-08-28 DIAGNOSIS — Z9861 Coronary angioplasty status: Secondary | ICD-10-CM

## 2019-08-28 DIAGNOSIS — E11621 Type 2 diabetes mellitus with foot ulcer: Secondary | ICD-10-CM | POA: Diagnosis present

## 2019-08-28 DIAGNOSIS — I998 Other disorder of circulatory system: Secondary | ICD-10-CM | POA: Diagnosis present

## 2019-08-28 DIAGNOSIS — Z881 Allergy status to other antibiotic agents status: Secondary | ICD-10-CM

## 2019-08-28 DIAGNOSIS — Z8661 Personal history of infections of the central nervous system: Secondary | ICD-10-CM

## 2019-08-28 DIAGNOSIS — K5900 Constipation, unspecified: Secondary | ICD-10-CM | POA: Diagnosis not present

## 2019-08-28 DIAGNOSIS — H5461 Unqualified visual loss, right eye, normal vision left eye: Secondary | ICD-10-CM | POA: Diagnosis not present

## 2019-08-28 LAB — COMPREHENSIVE METABOLIC PANEL
ALT: 18 U/L (ref 0–44)
AST: 34 U/L (ref 15–41)
Albumin: 2.7 g/dL — ABNORMAL LOW (ref 3.5–5.0)
Alkaline Phosphatase: 168 U/L — ABNORMAL HIGH (ref 38–126)
Anion gap: 15 (ref 5–15)
BUN: 40 mg/dL — ABNORMAL HIGH (ref 8–23)
CO2: 24 mmol/L (ref 22–32)
Calcium: 8.2 mg/dL — ABNORMAL LOW (ref 8.9–10.3)
Chloride: 96 mmol/L — ABNORMAL LOW (ref 98–111)
Creatinine, Ser: 8.08 mg/dL — ABNORMAL HIGH (ref 0.61–1.24)
GFR calc Af Amer: 7 mL/min — ABNORMAL LOW (ref >60)
GFR calc non Af Amer: 6 mL/min — ABNORMAL LOW (ref >60)
Glucose, Bld: 136 mg/dL — ABNORMAL HIGH (ref 70–99)
Potassium: 3.6 mmol/L (ref 3.5–5.1)
Sodium: 135 mmol/L (ref 135–145)
Total Bilirubin: 1.6 mg/dL — ABNORMAL HIGH (ref 0.3–1.2)
Total Protein: 6.7 g/dL (ref 6.5–8.1)

## 2019-08-28 LAB — CBC WITH DIFFERENTIAL/PLATELET
Abs Immature Granulocytes: 0.09 10*3/uL — ABNORMAL HIGH (ref 0.00–0.07)
Basophils Absolute: 0.1 10*3/uL (ref 0.0–0.1)
Basophils Relative: 0 %
Eosinophils Absolute: 0.1 10*3/uL (ref 0.0–0.5)
Eosinophils Relative: 1 %
HCT: 32.7 % — ABNORMAL LOW (ref 39.0–52.0)
Hemoglobin: 9.8 g/dL — ABNORMAL LOW (ref 13.0–17.0)
Immature Granulocytes: 1 %
Lymphocytes Relative: 5 %
Lymphs Abs: 0.9 10*3/uL (ref 0.7–4.0)
MCH: 26.8 pg (ref 26.0–34.0)
MCHC: 30 g/dL (ref 30.0–36.0)
MCV: 89.6 fL (ref 80.0–100.0)
Monocytes Absolute: 1.4 10*3/uL — ABNORMAL HIGH (ref 0.1–1.0)
Monocytes Relative: 8 %
Neutro Abs: 15.1 10*3/uL — ABNORMAL HIGH (ref 1.7–7.7)
Neutrophils Relative %: 85 %
Platelets: 228 10*3/uL (ref 150–400)
RBC: 3.65 MIL/uL — ABNORMAL LOW (ref 4.22–5.81)
RDW: 17 % — ABNORMAL HIGH (ref 11.5–15.5)
WBC: 17.6 10*3/uL — ABNORMAL HIGH (ref 4.0–10.5)
nRBC: 0 % (ref 0.0–0.2)

## 2019-08-28 LAB — RESPIRATORY PANEL BY RT PCR (FLU A&B, COVID)
Influenza A by PCR: NEGATIVE
Influenza B by PCR: NEGATIVE
SARS Coronavirus 2 by RT PCR: NEGATIVE

## 2019-08-28 LAB — LACTIC ACID, PLASMA
Lactic Acid, Venous: 0.8 mmol/L (ref 0.5–1.9)
Lactic Acid, Venous: 0.8 mmol/L (ref 0.5–1.9)

## 2019-08-28 LAB — PROCALCITONIN: Procalcitonin: 2.05 ng/mL

## 2019-08-28 LAB — POC SARS CORONAVIRUS 2 AG: SARS Coronavirus 2 Ag: NEGATIVE

## 2019-08-28 MED ORDER — NOREPINEPHRINE 4 MG/250ML-% IV SOLN
2.0000 ug/min | INTRAVENOUS | Status: DC
  Administered 2019-08-28: 2 ug/min via INTRAVENOUS
  Filled 2019-08-28: qty 250

## 2019-08-28 MED ORDER — SODIUM CHLORIDE 0.9 % IV SOLN
2.0000 g | Freq: Once | INTRAVENOUS | Status: AC
  Administered 2019-08-28: 2 g via INTRAVENOUS
  Filled 2019-08-28: qty 2

## 2019-08-28 MED ORDER — VANCOMYCIN HCL 750 MG/150ML IV SOLN
750.0000 mg | Freq: Once | INTRAVENOUS | Status: AC
  Administered 2019-08-28: 750 mg via INTRAVENOUS
  Filled 2019-08-28: qty 150

## 2019-08-28 MED ORDER — SODIUM CHLORIDE 0.9 % IV BOLUS
500.0000 mL | Freq: Once | INTRAVENOUS | Status: AC
  Administered 2019-08-28: 18:00:00 500 mL via INTRAVENOUS

## 2019-08-28 MED ORDER — METRONIDAZOLE IN NACL 5-0.79 MG/ML-% IV SOLN
500.0000 mg | Freq: Once | INTRAVENOUS | Status: AC
  Administered 2019-08-28: 500 mg via INTRAVENOUS
  Filled 2019-08-28: qty 100

## 2019-08-28 MED ORDER — ENOXAPARIN SODIUM 40 MG/0.4ML ~~LOC~~ SOLN: 40.0000 mg | SUBCUTANEOUS | Status: DC

## 2019-08-28 MED ORDER — SODIUM CHLORIDE 0.9 % IV BOLUS
500.0000 mL | Freq: Once | INTRAVENOUS | Status: AC
  Administered 2019-08-28: 22:00:00 500 mL via INTRAVENOUS

## 2019-08-28 MED ORDER — HEPARIN SODIUM (PORCINE) 5000 UNIT/ML IJ SOLN
5000.0000 [IU] | Freq: Three times a day (TID) | INTRAMUSCULAR | Status: DC
  Administered 2019-08-28 – 2019-09-05 (×23): 5000 [IU] via SUBCUTANEOUS
  Filled 2019-08-28 (×23): qty 1

## 2019-08-28 MED ORDER — SODIUM CHLORIDE 0.9 % IV SOLN
250.0000 mL | INTRAVENOUS | Status: DC
  Administered 2019-08-28 – 2019-09-01 (×2): 250 mL via INTRAVENOUS

## 2019-08-28 MED ORDER — VANCOMYCIN HCL IN DEXTROSE 1-5 GM/200ML-% IV SOLN
1000.0000 mg | Freq: Once | INTRAVENOUS | Status: AC
  Administered 2019-08-28: 19:00:00 1000 mg via INTRAVENOUS
  Filled 2019-08-28: qty 200

## 2019-08-28 MED ORDER — LIDOCAINE HCL (PF) 1 % IJ SOLN
INTRAMUSCULAR | Status: AC
  Filled 2019-08-28: qty 5
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
## 2019-08-28 NOTE — ED Triage Notes (Signed)
Pt arrival via [facility] from [facility] due to possible infection on left foot. EMS states that [facility] stated that he's been having problems on his left foot since Friday and that today there was noticeable darkening on his toes. Pt foot has odor upon arrival.   Dr. Quentin Cornwall at bedside.  Per [facility] staff, pt is only able to obtain BP on left leg due to fistulas in both arms and prostetic leg on right leg.   Pt VS with EMS: -BP 96/39 left leg -HR 72 Temp- 102
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
## 2019-08-28 NOTE — ED Notes (Signed)
Pt has gangre to left lower foot, second toe is notably darker than the others. Odor is noted from the foot

## 2019-08-28 NOTE — Consult Note (Signed)
PHARMACY -  BRIEF ANTIBIOTIC NOTE   Pharmacy has received consult(s) for cellultis from an ED provider.  The patient's profile has been reviewed for ht/wt/allergies/indication/available labs.    One time order(s) placed for cefepime and vancomycin  Further antibiotics/pharmacy consults should be ordered by admitting physician if indicated.                       Thank you, Oswald Hillock 08/28/2019  5:34 PM

## 2019-08-28 NOTE — Consult Note (Addendum)
Name: Dennis Walker MRN: [id number] DOB: [birth year]    ADMISSION DATE:  08/28/2019 CONSULTATION DATE: 08/28/2019  REFERRING MD : Sharion Settler, NP   CHIEF COMPLAINT: Left Foot Infection   BRIEF PATIENT DESCRIPTION: 69 yo male admitted with septic shock secondary to gangrenous left toes and ulcerations requiring levophed gtt   SIGNIFICANT EVENTS/STUDIES:  02/24: Pt admitted to the stepdown unit, however due to worsening hypotension secondary to septic shock pt transferred to ICU and PCCM team assumed care   HISTORY OF PRESENT ILLNESS:   This is a 69 yo male with a PMH of Stroke, Polio, PVD, MI, HTN, Hyperlipidemia, Heart Murmur, ESRD on HD (T-Th-Sat), Dyspnea, Depression, CAD, CVA with Left Hemiparesis, Recurrent MRSA Bacteremia, Cervical Spine Epidural Abscess, Asthma, Arthritis, Anxiety, Anginal Pain, Right BKA, and HFpEF.  He presented to [facility] ER on 02/24 via EMS from [facility] with suspected left foot infection.  Per ER notes nursing staff at the facility reported the pt has had signs of a left foot infection that started on 02/19, however today 02/24 the staff noticed darkening of the left toes.  They also reported they were unable to obtain a blood pressure prompting EMS notification. EMS reported pts vital signs were: bp 96/39/hr 72/temp 102 F.  Upon arrival to the ER pt hypotensive sbp 70's to low 90's on the left leg (baseline sbp 114-130's).  He received 1L NS bolus and remained hypotensive (although left lower extremity was the only site to obtain bp readings due to bilateral upper extremity fistula's, therefore ER bp readings could  be inaccurate).  Lab results revealed glucose 136, BUN 40, creatinine 8.08, alk phos 168, albumin 2.7, lactic acid 0.8, pct 2.05, wbc 17.6, and hgb 9.8.  Influenza PCR/COVID-19 negative, however CXR concerning for mild cardiomegaly and chronic interstitial prominence.  Left foot xray revealed atretic soft tissues of the 2nd toe suspicious for  infarction, possible tuft erosions of the distal 1st and 2nd digits, findings could reflect early osteomyelitis, and extensive vascular calcifications. He received cefepime, flagyl, and vancomycin. Pt initially admitted to the stepdown unit per hospitalist team, however due to continued hypotension pt transferred to ICU and PCCM team assumed care.    PAST MEDICAL HISTORY :   has a past medical history of Anginal pain ([place]), Anxiety, Arthritis, Asthma, CHF (congestive heart failure) ([place]), Coronary artery disease, Depression, Diabetes mellitus without complication ([place]), Dyspnea, ESRD (end stage renal disease) ([place]), Heart murmur, Hyperlipidemia, Hypertension, Myocardial infarction [facility]), Peripheral vascular disease ([place]), Polio, and Stroke ([place]).  has a past surgical history that includes A/V Fistulagram (Left, 01/31/2017); Leg amputation below knee (Right, 2003); Enucleation; Coronary angioplasty; A/V Fistulagram (N/A, 07/07/2017); A/V Fistulagram (Left, 02/07/2018); TEE without cardioversion (N/A, 02/20/2018); A/V Fistulagram (Left, 03/13/2018); Lower Extremity Angiography (Left, 12/04/2018); UPPER EXTREMITY VENOGRAPHY (Right, 12/04/2018); A/V Fistulagram (N/A, 06/05/2019); AV fistula placement (Right, 08/07/2019); and CENTRAL LINE INSERTION (Right, 08/07/2019). Prior to Admission medications   Medication Sig Start Date End Date Taking? Authorizing Provider  acetaminophen (TYLENOL) 500 MG tablet Take 500 mg by mouth every 4 (four) hours as needed for mild pain or fever.   Yes [provider]  albuterol (VENTOLIN HFA) 108 (90 Base) MCG/ACT inhaler Inhale 2 puffs into the lungs every 4 (four) hours as needed for wheezing or shortness of breath.   Yes [provider]  aspirin EC 81 MG tablet Take 1 tablet (81 mg total) by mouth daily. Patient taking differently: Take 81 mg by mouth daily at 6 PM.  08/14/19  Yes End, Harrell Gave, MD  atorvastatin (LIPITOR) 10 MG tablet Take 20 mg by mouth daily at 6 PM.   07/17/18  Yes [provider]  b complex vitamins tablet Take 1 tablet by mouth daily at 6 PM.   Yes [provider]  benzocaine (ORAJEL MAXIMUM STRENGTH) 20 % GEL Use as directed 1 application in the mouth or throat 4 (four) times daily as needed (pain).    Yes [provider]  Cholecalciferol (D3-1000) 1000 units tablet Take 1,000 Units by mouth daily at 6 PM.    Yes [provider]  clopidogrel (PLAVIX) 75 MG tablet Take 75 mg by mouth daily at 6 PM.   Yes [provider]  ferrous sulfate 325 (65 FE) MG tablet Take 325 mg by mouth daily at 6 PM.   Yes [provider]  gabapentin (NEURONTIN) 100 MG capsule Take 1 capsule (100 mg total) by mouth every evening. Patient taking differently: Take 200 mg by mouth 2 (two) times daily.  12/29/17  Yes Vaughan Basta, MD  ibuprofen (ADVIL,MOTRIN) 200 MG tablet Take 400 mg by mouth every 6 (six) hours as needed for moderate pain.    Yes [provider]  lactulose (CHRONULAC) 10 GM/15ML solution Take 20 g by mouth at bedtime.    Yes [provider]  lidocaine-prilocaine (EMLA) cream Apply 1 application topically Every Tuesday,Thursday,and Saturday with dialysis.    Yes [provider]  linagliptin (TRADJENTA) 5 MG TABS tablet Take 5 mg by mouth daily.   Yes [provider]  lisinopril (PRINIVIL,ZESTRIL) 10 MG tablet Take 1 tablet (10 mg total) by mouth daily at 6 PM. Patient taking differently: Take 20 mg by mouth daily at 6 PM.  02/23/18  Yes Demetrios Loll, MD  metoprolol succinate (TOPROL-XL) 25 MG 24 hr tablet Take 25 mg by mouth daily at 6 PM.    Yes [provider]  Multiple Vitamin (MULTIVITAMIN) tablet Take 1 tablet by mouth at bedtime.    Yes [provider]  nitroGLYCERIN (NITROSTAT) 0.4 MG SL tablet Place 0.4 mg under the tongue every 5 (five) minutes as needed for chest pain.   Yes [provider]  oxyCODONE (OXY IR/ROXICODONE) 5  MG immediate release tablet Take 5 mg by mouth 3 (three) times daily as needed for moderate pain.  08/14/18  Yes [provider]  Polyethyl Glyc-Propyl Glyc PF (SYSTANE ULTRA PF) 0.4-0.3 % SOLN Place 1 drop into the left eye 4 (four) times daily.   Yes [provider]  polyethylene glycol (MIRALAX / GLYCOLAX) packet Take 17 g by mouth daily at 6 PM.    Yes [provider]  Polyvinyl Alcohol-Povidone 5-6 MG/ML SOLN Place 1 drop into the left eye 4 (four) times daily.   Yes [provider]  saccharomyces boulardii (FLORASTOR) 250 MG capsule Take 250 mg by mouth daily.   Yes [provider]  sertraline (ZOLOFT) 100 MG tablet Take 100 mg by mouth at bedtime.    Yes [provider]  sevelamer carbonate (RENVELA) 800 MG tablet Take 800 mg by mouth 2 (two) times daily. (with snacks)   Yes [provider]  sevelamer carbonate (RENVELA) 800 MG tablet Take 1,600 mg by mouth 3 (three) times daily with meals.   Yes [provider]  Skin Protectants, Misc. (EUCERIN) cream Apply 1 application topically 2 (two) times daily as needed (rash).    Yes [provider]  oxyCODONE-acetaminophen (PERCOCET) 5-325 MG tablet Take  1 tablet by mouth every 6 (six) hours as needed for severe pain. 08/07/19 08/06/20  Schnier, Dolores Lory, MD   Allergies  Allergen Reactions  . Cefuroxime Itching  . Shrimp [Shellfish Allergy] Other (See Comments)    Per mar. Unknown reaction   . Sulfa Antibiotics Itching and Rash    FAMILY HISTORY:  family history includes Cancer in his mother; Stroke in his father. SOCIAL HISTORY:  reports that he has never smoked. He has never used smokeless tobacco. He reports current drug use. Drug: "Crack" cocaine. He reports that he does not drink alcohol.  REVIEW OF SYSTEMS: Positives in BOLD   Constitutional: Negative for fever, chills, weight loss, malaise/fatigue and diaphoresis.  HENT: Negative for hearing loss, ear pain,  nosebleeds, congestion, sore throat, neck pain, tinnitus and ear discharge.   Eyes: Negative for blurred vision, double vision, photophobia, pain, discharge and redness.  Respiratory: Negative for cough, hemoptysis, sputum production, shortness of breath, wheezing and stridor.   Cardiovascular: Negative for chest pain, palpitations, orthopnea, claudication, leg swelling and PND.  Gastrointestinal: Negative for heartburn, nausea, vomiting, abdominal pain, diarrhea, constipation, blood in stool and melena.  Genitourinary: Negative for dysuria, urgency, frequency, hematuria and flank pain.  Musculoskeletal: left foot infection, left lower extremity pain, myalgias, back pain, joint pain and falls.  Skin: Negative for itching and rash.  Neurological: Negative for dizziness, tingling, tremors, sensory change, speech change, focal weakness, seizures, loss of consciousness, weakness and headaches.  Endo/Heme/Allergies: Negative for environmental allergies and polydipsia. Does not bruise/bleed easily.  SUBJECTIVE:  c/o left lower extremity pain   VITAL SIGNS: Temp:  [99.1 F (37.3 C)-101 F (38.3 C)] 99.1 F (37.3 C) (02/24 2137) Pulse Rate:  [74-83] 74 (02/24 2145) Resp:  [16-25] 20 (02/24 2145) BP: (75-94)/(25-50) 75/34 (02/24 2145) SpO2:  [95 %-100 %] 95 % (02/24 2145) Weight:  [85 kg] 85 kg (02/24 1739)  PHYSICAL EXAMINATION: General: chronically ill appearing male, NAD resting in bed  Neuro: alert and oriented to self, follows commands  HEENT: supple, no JVD  Cardiovascular: nsr, rrr, no R/G, 1+ bilateral upper extremity edema   Lungs: clear throughout, even, non labored  Abdomen: +BS x4, obese, soft, non tender, non distended Musculoskeletal: right BKA, moves all extremities  Skin: gangrenous left toes with malodorous drainage, left heel pressure ulceration    Recent Labs  Lab 08/28/19 1746  NA 135  K 3.6  CL 96*  CO2 24  BUN 40*  CREATININE 8.08*  GLUCOSE 136*   Recent  Labs  Lab 08/28/19 1746  HGB 9.8*  HCT 32.7*  WBC 17.6*  PLT 228   DG Chest Port 1 View  Result Date: 08/28/2019 CLINICAL DATA:  Possible left foot infection with skin changes. EXAM: PORTABLE CHEST 1 VIEW COMPARISON:  Radiographs 02/16/2018 and 01/23/2018. FINDINGS: 1758 hours. Stable mild cardiomegaly and aortic atherosclerosis. There is a left subclavian vascular stent. Mild interstitial prominence in both lungs appears unchanged. No evidence of superimposed edema, confluent airspace opacity, pneumothorax or significant pleural effusion. The bones appear unchanged. Telemetry leads overlie the chest. IMPRESSION: Stable chest with mild cardiomegaly and chronic interstitial prominence. No evidence of pneumonia. Electronically Signed   By: Richardean Sale M.D.   On: 08/28/2019 18:36   DG Foot Complete Left  Result Date: 08/28/2019 CLINICAL DATA:  Possible left foot infection. Progressive symptoms over the last 5 days with skin darkening of the toes and foul odor. EXAM: LEFT FOOT - COMPLETE 3+ VIEW COMPARISON:  None. FINDINGS: The  bones appear adequately mineralized. There is no evidence of acute fracture or dislocation. The soft tissues of the 2nd toe appear atretic with possible exposure of the distal tuft. There are possible erosions of the distal tuft of the 2nd digit as well as the great toe. No other evidence of bone destruction. Extensive vascular calcifications are noted. There is mild dorsal forefoot soft tissue swelling without soft tissue emphysema or foreign body. IMPRESSION: Atretic soft tissues of the 2nd toe suspicious for infarction. Possible tuft erosions of the distal 1st and 2nd digits. These findings could reflect early osteomyelitis. Extensive vascular calcifications. Electronically Signed   By: Richardean Sale M.D.   On: 08/28/2019 18:40    ASSESSMENT / PLAN:  Asthma-stable Prn supplemental O2 for dyspnea and/or hypoxia  Prn bronchodilator therapy   Septic shock secondary to  gangrenous left toes and ulcerations  Hx: PVD, HTN, HLD, Heart Murmur, HFpEF, Stroke, CAD, and Anginal Pain  Continuous telemetry monitoring  No indication for arterial line placement pt currently requiring 2 mcg/min of levophed and able to obtain accurate bp readings  Prn levophed gtt to maintain map >65 Hold outpatient antihypertensives Continue atorvastatin  Trend WBC and monitor fever curve  Follow cultures  Continue vancomycin and cefepime  Will consult vascular surgery appreciate input   Acute on chronic renal failure secondary to sepsis  Trend BMP  Replace electrolytes as indicated  Avoid nephrotoxic medications   Elevated alkaline phosphatase   Trend hepatic panel   Anemia of CKD  Trend CBC  Monitor for s/sx of bleeding and transfuse for hgb <7  Best Practice: VTE px: will start subq heparin  Diet: NPO for now for possible surgical procedure   Marda Stalker, [place] Pager [phone] (please enter 7 digits) PCCM Consult Pager [phone] (please enter 7 digits)

## 2019-08-28 NOTE — ED Notes (Signed)
Pt has fistula's in both arms, so this nurse unable to obtain IV. Dr. Quentin Cornwall at bedside with ultrasound IV to get an IV in the neck. Blood work and one set of blood cultures obtained at this time.

## 2019-08-28 NOTE — ED Notes (Addendum)
Dr. Quentin Cornwall said one set of blood cultures is acceptable due to limited places to stick.

## 2019-08-29 ENCOUNTER — Other Ambulatory Visit: Payer: Self-pay

## 2019-08-29 DIAGNOSIS — R6520 Severe sepsis without septic shock: Secondary | ICD-10-CM

## 2019-08-29 DIAGNOSIS — I96 Gangrene, not elsewhere classified: Secondary | ICD-10-CM

## 2019-08-29 DIAGNOSIS — A419 Sepsis, unspecified organism: Secondary | ICD-10-CM | POA: Diagnosis present

## 2019-08-29 LAB — VANCOMYCIN, RANDOM: Vancomycin Rm: 28

## 2019-08-29 LAB — BASIC METABOLIC PANEL
Anion gap: 17 — ABNORMAL HIGH (ref 5–15)
BUN: 45 mg/dL — ABNORMAL HIGH (ref 8–23)
CO2: 23 mmol/L (ref 22–32)
Calcium: 8.1 mg/dL — ABNORMAL LOW (ref 8.9–10.3)
Chloride: 97 mmol/L — ABNORMAL LOW (ref 98–111)
Creatinine, Ser: 8.66 mg/dL — ABNORMAL HIGH (ref 0.61–1.24)
GFR calc Af Amer: 7 mL/min — ABNORMAL LOW (ref >60)
GFR calc non Af Amer: 6 mL/min — ABNORMAL LOW (ref >60)
Glucose, Bld: 148 mg/dL — ABNORMAL HIGH (ref 70–99)
Potassium: 3.9 mmol/L (ref 3.5–5.1)
Sodium: 137 mmol/L (ref 135–145)

## 2019-08-29 LAB — CBC
HCT: 32.2 % — ABNORMAL LOW (ref 39.0–52.0)
Hemoglobin: 10 g/dL — ABNORMAL LOW (ref 13.0–17.0)
MCH: 27.3 pg (ref 26.0–34.0)
MCHC: 31.1 g/dL (ref 30.0–36.0)
MCV: 88 fL (ref 80.0–100.0)
Platelets: 241 10*3/uL (ref 150–400)
RBC: 3.66 MIL/uL — ABNORMAL LOW (ref 4.22–5.81)
RDW: 17.1 % — ABNORMAL HIGH (ref 11.5–15.5)
WBC: 21.8 10*3/uL — ABNORMAL HIGH (ref 4.0–10.5)
nRBC: 0 % (ref 0.0–0.2)

## 2019-08-29 LAB — HEMOGLOBIN A1C
Hgb A1c MFr Bld: 7.8 % — ABNORMAL HIGH (ref 4.8–5.6)
Mean Plasma Glucose: 177.16 mg/dL

## 2019-08-29 LAB — GLUCOSE, CAPILLARY
Glucose-Capillary: 109 mg/dL — ABNORMAL HIGH (ref 70–99)
Glucose-Capillary: 136 mg/dL — ABNORMAL HIGH (ref 70–99)
Glucose-Capillary: 96 mg/dL (ref 70–99)
Glucose-Capillary: 97 mg/dL (ref 70–99)

## 2019-08-29 LAB — MRSA PCR SCREENING: MRSA by PCR: NEGATIVE

## 2019-08-29 LAB — HIV ANTIBODY (ROUTINE TESTING W REFLEX): HIV Screen 4th Generation wRfx: NONREACTIVE

## 2019-08-29 MED ORDER — B COMPLEX PO TABS: 1.0000 | ORAL_TABLET | Freq: Every day | ORAL | Status: DC

## 2019-08-29 MED ORDER — POLYVINYL ALCOHOL-POVIDONE 5-6 MG/ML OP SOLN: 1.0000 [drp] | Freq: Four times a day (QID) | OPHTHALMIC | Status: DC

## 2019-08-29 MED ORDER — OXYCODONE-ACETAMINOPHEN 5-325 MG PO TABS
1.0000 | ORAL_TABLET | Freq: Four times a day (QID) | ORAL | Status: DC | PRN
  Administered 2019-08-29 – 2019-08-30 (×3): 1 via ORAL
  Filled 2019-08-29 (×4): qty 1

## 2019-08-29 MED ORDER — EPOETIN ALFA 10000 UNIT/ML IJ SOLN
4000.0000 [IU] | INTRAMUSCULAR | Status: DC
  Administered 2019-08-29: 4000 [IU] via INTRAVENOUS

## 2019-08-29 MED ORDER — POLYVINYL ALCOHOL 1.4 % OP SOLN
1.0000 [drp] | Freq: Four times a day (QID) | OPHTHALMIC | Status: DC
  Administered 2019-08-29 – 2019-09-05 (×27): 1 [drp] via OPHTHALMIC
  Filled 2019-08-29: qty 15

## 2019-08-29 MED ORDER — INSULIN ASPART 100 UNIT/ML ~~LOC~~ SOLN
0.0000 [IU] | SUBCUTANEOUS | Status: DC
  Administered 2019-08-30: 3 [IU] via SUBCUTANEOUS
  Administered 2019-08-30 – 2019-08-31 (×3): 2 [IU] via SUBCUTANEOUS
  Administered 2019-08-31 (×3): 3 [IU] via SUBCUTANEOUS
  Administered 2019-09-01: 08:00:00 2 [IU] via SUBCUTANEOUS
  Administered 2019-09-01 (×2): 3 [IU] via SUBCUTANEOUS
  Administered 2019-09-01 – 2019-09-05 (×9): 2 [IU] via SUBCUTANEOUS
  Filled 2019-08-29 (×17): qty 1
  Filled 2019-08-29: qty 0.15
  Filled 2019-08-29: qty 1

## 2019-08-29 MED ORDER — MORPHINE SULFATE (PF) 2 MG/ML IV SOLN
INTRAVENOUS | Status: AC
  Administered 2019-08-29: 01:00:00 2 mg via INTRAVENOUS
  Filled 2019-08-29: qty 1

## 2019-08-29 MED ORDER — SERTRALINE HCL 50 MG PO TABS: 100.0000 mg | ORAL_TABLET | Freq: Every day | ORAL | Status: DC

## 2019-08-29 MED ORDER — POLYETHYLENE GLYCOL 3350 17 G PO PACK: 17.0000 g | PACK | Freq: Every day | ORAL | Status: DC | PRN

## 2019-08-29 MED ORDER — CHLORHEXIDINE GLUCONATE CLOTH 2 % EX PADS
6.0000 | MEDICATED_PAD | Freq: Every day | CUTANEOUS | Status: DC
  Administered 2019-08-29 – 2019-09-03 (×5): 6 via TOPICAL

## 2019-08-29 MED ORDER — SERTRALINE HCL 50 MG PO TABS
100.0000 mg | ORAL_TABLET | Freq: Every day | ORAL | Status: DC
  Administered 2019-08-30 – 2019-09-04 (×6): 100 mg via ORAL
  Filled 2019-08-29 (×6): qty 2

## 2019-08-29 MED ORDER — PIPERACILLIN-TAZOBACTAM 3.375 G IVPB
3.3750 g | Freq: Two times a day (BID) | INTRAVENOUS | Status: DC
  Administered 2019-08-29 – 2019-09-03 (×11): 3.375 g via INTRAVENOUS
  Filled 2019-08-29 (×15): qty 50

## 2019-08-29 MED ORDER — MORPHINE SULFATE (PF) 2 MG/ML IV SOLN: 2.0000 mg | Freq: Once | INTRAVENOUS | Status: AC

## 2019-08-29 MED ORDER — FERROUS SULFATE 325 (65 FE) MG PO TABS
325.0000 mg | ORAL_TABLET | Freq: Every day | ORAL | Status: DC
  Administered 2019-09-01 – 2019-09-05 (×5): 325 mg via ORAL
  Filled 2019-08-29 (×6): qty 1

## 2019-08-29 MED ORDER — ACETAMINOPHEN 650 MG RE SUPP
650.0000 mg | RECTAL | Status: DC | PRN
  Administered 2019-08-29: 18:00:00 650 mg via RECTAL
  Filled 2019-08-29: qty 1

## 2019-08-29 MED ORDER — GABAPENTIN 100 MG PO CAPS
100.0000 mg | ORAL_CAPSULE | Freq: Every evening | ORAL | Status: DC
  Administered 2019-09-01 – 2019-09-05 (×5): 100 mg via ORAL
  Filled 2019-08-29 (×6): qty 1

## 2019-08-29 MED ORDER — VITAMIN D 25 MCG (1000 UNIT) PO TABS
1000.0000 [IU] | ORAL_TABLET | Freq: Every day | ORAL | Status: DC
  Administered 2019-09-01 – 2019-09-05 (×5): 1000 [IU] via ORAL
  Filled 2019-08-29 (×6): qty 1

## 2019-08-29 MED ORDER — ORAL CARE MOUTH RINSE
15.0000 mL | Freq: Two times a day (BID) | OROMUCOSAL | Status: DC
  Administered 2019-08-29 – 2019-09-05 (×13): 15 mL via OROMUCOSAL

## 2019-08-29 MED ORDER — ATORVASTATIN CALCIUM 20 MG PO TABS
20.0000 mg | ORAL_TABLET | Freq: Every day | ORAL | Status: DC
  Administered 2019-09-01 – 2019-09-05 (×5): 20 mg via ORAL
  Filled 2019-08-29 (×6): qty 1

## 2019-08-29 MED ORDER — POLYETHYLENE GLYCOL 3350 17 G PO PACK: 17.0000 g | PACK | Freq: Every day | ORAL | Status: DC

## 2019-08-29 MED ORDER — ONE-DAILY MULTI VITAMINS PO TABS: 1.0000 | ORAL_TABLET | Freq: Every day | ORAL | Status: DC

## 2019-08-29 MED ORDER — VANCOMYCIN HCL IN DEXTROSE 1-5 GM/200ML-% IV SOLN
1000.0000 mg | INTRAVENOUS | Status: DC
  Administered 2019-08-29 – 2019-08-31 (×2): 1000 mg via INTRAVENOUS
  Filled 2019-08-29 (×2): qty 200

## 2019-08-29 MED ORDER — ORAL CARE MOUTH RINSE: 15.0000 mL | Freq: Two times a day (BID) | OROMUCOSAL | Status: DC

## 2019-08-29 MED ORDER — SODIUM CHLORIDE 0.9 % IV SOLN
2.0000 g | INTRAVENOUS | Status: DC
  Filled 2019-08-29: qty 2

## 2019-08-29 MED ORDER — ADULT MULTIVITAMIN W/MINERALS CH
1.0000 | ORAL_TABLET | Freq: Every day | ORAL | Status: DC
  Administered 2019-08-30 – 2019-09-04 (×7): 1 via ORAL
  Filled 2019-08-29 (×7): qty 1

## 2019-08-29 MED ORDER — ALBUTEROL SULFATE (2.5 MG/3ML) 0.083% IN NEBU
2.5000 mg | INHALATION_SOLUTION | RESPIRATORY_TRACT | Status: DC | PRN
  Administered 2019-09-03 – 2019-09-05 (×2): 2.5 mg via RESPIRATORY_TRACT
  Filled 2019-08-29 (×2): qty 3

## 2019-08-29 MED ORDER — LIDOCAINE-PRILOCAINE 2.5-2.5 % EX CREA
TOPICAL_CREAM | Freq: Once | CUTANEOUS | Status: AC
  Filled 2019-08-29: qty 5

## 2019-08-29 NOTE — Progress Notes (Addendum)
Pharmacy Antibiotic Note  Dennis [name] is a 69 y.o. male admitted on 08/28/2019 with osteomyelitis s/t gangrene of LLE.  Pharmacy has been consulted for cefepime/vanc dosing.  Plan: Will continue cefepime 2g IV q24h per CrCl < 11 ml/min, but treating a bit more aggressively given the extensive nature of patient's LLE gangrene  Patient also ESRD on HD TThSa -- no mention currently of dialysis plans, most likely patient will be started on CRRT in unit.  Will check vanc random w/ am labs to assess level and will dose per levels for now until dialysis plan is established.  Goal random for redosing < 20 mcg/mL. Will continue to monitor.  Height: 5\' 11"  (180.3 cm) Weight: 187 lb 6.3 oz (85 kg) IBW/kg (Calculated) : 75.3  Temp (24hrs), Avg:99.4 F (37.4 C), Min:98.2 F (36.8 C), Max:101 F (38.3 C)  Recent Labs  Lab 08/28/19 1746 08/28/19 2137  WBC 17.6*  --   CREATININE 8.08*  --   LATICACIDVEN 0.8 0.8    Estimated Creatinine Clearance: 9.4 mL/min (A) (by C-G formula based on SCr of 8.08 mg/dL (H)).    Allergies  Allergen Reactions  . Cefuroxime Itching  . Shrimp [Shellfish Allergy] Other (See Comments)    Per mar. Unknown reaction   . Sulfa Antibiotics Itching and Rash    Thank you for allowing pharmacy to be a part of this patient's care.  Tobie Lords, PharmD, BCPS Clinical Pharmacist 08/29/2019 12:32 AM

## 2019-08-29 NOTE — Progress Notes (Signed)
Pharmacy Antibiotic Note  Dennis [name] is a 69 y.o. male admitted on 08/28/2019 with osteomyelitis s/t gangrene of LLE.  Pharmacy has been consulted for cefepime/vanc dosing.  Plan: 02/25 @ 0500 VR 28 mcg/mL will hold off and check another random w/ am labs and continue to dose per levels until plans for dialysis are made.  Goal random for redosing < 20 mcg/mL. Will continue to monitor.  Height: 5\' 11"  (180.3 cm) Weight: 187 lb 6.3 oz (85 kg) IBW/kg (Calculated) : 75.3  Temp (24hrs), Avg:99.4 F (37.4 C), Min:98.2 F (36.8 C), Max:101 F (38.3 C)  Recent Labs  Lab 08/28/19 1746 08/28/19 2137 08/29/19 0518  WBC 17.6*  --  21.8*  CREATININE 8.08*  --  8.66*  LATICACIDVEN 0.8 0.8  --   VANCORANDOM  --   --  28    Estimated Creatinine Clearance: 8.8 mL/min (A) (by C-G formula based on SCr of 8.66 mg/dL (H)).    Allergies  Allergen Reactions  . Cefuroxime Itching  . Shrimp [Shellfish Allergy] Other (See Comments)    Per mar. Unknown reaction   . Sulfa Antibiotics Itching and Rash    Thank you for allowing pharmacy to be a part of this patient's care.  Tobie Lords, PharmD, BCPS Clinical Pharmacist 08/29/2019 6:32 AM

## 2019-08-29 NOTE — Progress Notes (Addendum)
Palliative-  I was able to reach [place] meeting scheduled for 4pm today.   Mariana Kaufman, AGNP-C Palliative Medicine  Please call Palliative Medicine team phone with any questions [phone]. For individual providers please see AMION.  No charge

## 2019-08-29 NOTE — Plan of Care (Signed)
Pt weaned off levo gtt, more wake this morning. VSS. Daughter called and was updated over the phone.

## 2019-08-29 NOTE — Progress Notes (Signed)
1330 Back from dialysis.

## 2019-08-29 NOTE — Progress Notes (Signed)
**Dennis Dennis via Obfuscation** Dennis Dennis   Subjective: Patient last seen in our office three days ago - please refer to that progress Dennis.  Patient well-known to our service.  Recent creation of a right brachial axillary arteriovenous graft (08/07/19).  Patient has been treated for atherosclerotic disease with gangrene in the past.  Patient has undergone endovascular intervention and attempt to revascularize the right lower extremity.  Patient presents to the emergency department yesterday evening with progressive gangrene to the right foot and sepsis.  Objective: Vitals:   08/29/19 1200 08/29/19 1215 08/29/19 1230 08/29/19 1245  BP: 118/63 110/63 103/60 100/63  Pulse: 81 84 87 88  Resp: 17 15 16 18   Temp:    99.1 F (37.3 C)  TempSrc:    Oral  SpO2:      Weight:      Height:        Intake/Output Summary (Last 24 hours) at 08/29/2019 1417 Last data filed at 08/29/2019 1245 Gross per 24 hour  Intake 94.7 ml  Output 500 ml  Net -405.3 ml   Physical Exam: Lethargic and consfused, NAD CV: RRR Pulmonary: CTA Bilaterally Abdomen: Soft, Nontender, Nondistended Vascular:  Right Lower Extremity: Thigh soft, calf soft. Foul odor noted. Unable to palpate pedal pulses. Progression of gangrene proximally. Extremity is warm. Tender to palpation.        Photos    08/28/2019 20:44  Attached To:  Hospital Encounter on 08/28/19   Laboratory: CBC    Component Value Date/Time   WBC 21.8 (H) 08/29/2019 0518   HGB 10.0 (L) 08/29/2019 0518   HCT 32.2 (L) 08/29/2019 0518   PLT 241 08/29/2019 0518   BMET    Component Value Date/Time   NA 137 08/29/2019 0518   K 3.9 08/29/2019 0518   CL 97 (L) 08/29/2019 0518   CO2 23 08/29/2019 0518   GLUCOSE 148 (H) 08/29/2019 0518   BUN 45 (H) 08/29/2019 0518   CREATININE 8.66 (H) 08/29/2019 0518   CALCIUM 8.1 (L) 08/29/2019 0518   GFRNONAA 6 (L) 08/29/2019 0518   GFRAA 7 (L) 08/29/2019 0518   Assessment/Planning: Patient  well-known to our service.  Recent creation of a right brachial axillary arteriovenous graft (08/07/19).  Patient has been treated for atherosclerotic disease with gangrene in the past.  Patient has undergone endovascular intervention and attempt to revascularize the right lower extremity.  Patient presents to the emergency department yesterday evening with progressive gangrene to the right foot and sepsis.  1) Patient with chronic left foot wound requiring endovascular intervention to reestablish arterial blood flow in the past.  2) Patient presents with sepsis in setting on gangrenous wound. 3) Recommend amputation as the patients limb is not salvageable through endovascular intervention 4) Would recommend angiogram to assess arterial blood flow as a deciding factor between AKA and BKA 5) Palliative meeting at 4pm - if patient and family will like aggressive care will place patient on schedule for angiogram tomorrow with Dew 6) Blood cultures are negative so far however positive cultures with graft in place in worrisome and may need excision.   Discussed with Dr. Ellis Parents Dennis Armes PA-C 08/29/2019 2:17 PM

## 2019-08-29 NOTE — Consult Note (Signed)
[facility] [place] Nurse Consult Note: Reason for Consult:gangrenous left foot/heel ulcer.  Consult pending to vascular/surgery team. Will defer to their service.   Wound type:Gangrenous   Pressure Injury POA: Yes left heel Will not follow at this time.  Please re-consult if needed.  Domenic Moras MSN, RN, FNP-BC CWON Wound, Ostomy, Continence Nurse Pager [phone]

## 2019-08-29 NOTE — Progress Notes (Signed)
Patient refused to take evening medications; Dr. Arbutus Ped made aware of refusal. Will continue to monitor patient.

## 2019-08-29 NOTE — Progress Notes (Signed)
[facility]  ROUNDING NOTE   Subjective:   [name] admitted to [facility] on 08/28/2019 for Gangrenous toe ([place]) [I96] Sepsis ([place]) [A41.9] Gangrene of left foot ([place]) [I96] Sepsis, due to unspecified organism, unspecified whether acute organ dysfunction present [place]) [A41.9]  Patient was found to have sepsis, he was admitted to the ICU overnight and placed on norepinephrine gtt. This has been weaned off this morning.   Placed on hemodialysis treatment.     HEMODIALYSIS FLOWSHEET:  Blood Flow Rate (mL/min): 400 mL/min Arterial Pressure (mmHg): -160 mmHg Venous Pressure (mmHg): 160 mmHg Transmembrane Pressure (mmHg): 50 mmHg Ultrafiltration Rate (mL/min): 330 mL/min Dialysate Flow Rate (mL/min): 600 ml/min Conductivity: Machine : 15 Conductivity: Machine : 15 Dialysis Fluid Bolus: Normal Saline Bolus Amount (mL): 250 mL    Objective:  Vital signs in last 24 hours:  Temp:  [98.2 F (36.8 C)-101 F (38.3 C)] 99 F (37.2 C) (02/25 0945) Pulse Rate:  [74-83] 80 (02/25 1000) Resp:  [10-25] 14 (02/25 1000) BP: (64-202)/(22-108) 107/63 (02/25 1000) SpO2:  [95 %-100 %] 98 % (02/25 0945) Weight:  [80.8 kg-85 kg] 80.8 kg (02/25 0528)  Weight change:  Filed Weights   08/28/19 1739 08/29/19 0528  Weight: 85 kg 80.8 kg    Intake/Output: I/O last 3 completed shifts: In: 94.7 [I.V.:94.7] Out: -    Intake/Output this shift:  No intake/output data recorded.  Physical Exam: General: NAD, laying in bed  Head: Normocephalic, atraumatic. Moist oral mucosal membranes  Eyes: Anicteric, PERRL  Neck: Supple, trachea midline  Lungs:  Clear to auscultation  Heart: Regular rate and rhythm  Abdomen:  Soft, nontender,   Extremities: Right BKA, left with gangrene, 2nd digit dry gangrene, tips of other four toes with ischemic changes   Neurologic: Left sided weakness  Skin: No lesions  Access: Left AVF, right AVF maturing.     Basic Metabolic Panel: Recent Labs   Lab 08/28/19 1746 08/29/19 0518  NA 135 137  K 3.6 3.9  CL 96* 97*  CO2 24 23  GLUCOSE 136* 148*  BUN 40* 45*  CREATININE 8.08* 8.66*  CALCIUM 8.2* 8.1*    Liver Function Tests: Recent Labs  Lab 08/28/19 1746  AST 34  ALT 18  ALKPHOS 168*  BILITOT 1.6*  PROT 6.7  ALBUMIN 2.7*   No results for input(s): LIPASE, AMYLASE in the last 168 hours. No results for input(s): AMMONIA in the last 168 hours.  CBC: Recent Labs  Lab 08/28/19 1746 08/29/19 0518  WBC 17.6* 21.8*  NEUTROABS 15.1*  --   HGB 9.8* 10.0*  HCT 32.7* 32.2*  MCV 89.6 88.0  PLT 228 241    Cardiac Enzymes: No results for input(s): CKTOTAL, CKMB, CKMBINDEX, TROPONINI in the last 168 hours.  BNP: Invalid input(s): POCBNP  CBG: Recent Labs  Lab 08/29/19 0009  GLUCAP 136*    Microbiology: Results for orders placed or performed during the hospital encounter of 08/28/19  Blood Culture (routine x 2)     Status: None (Preliminary result)   Collection Time: 08/28/19  5:46 PM   Specimen: BLOOD  Result Value Ref Range Status   Specimen Description BLOOD RESIDENT  Final   Special Requests   Final    BOTTLES DRAWN AEROBIC AND ANAEROBIC Blood Culture results may not be optimal due to an inadequate volume of blood received in culture bottles   Culture   Final    NO GROWTH < 24 HOURS Performed at [facility],  [place], [place] [address]    Report Status PENDING  Incomplete  Respiratory Panel by RT PCR (Flu A&B, Covid) - Nasopharyngeal Swab     Status: None   Collection Time: 08/28/19  7:32 PM   Specimen: Nasopharyngeal Swab  Result Value Ref Range Status   SARS Coronavirus 2 by RT PCR NEGATIVE NEGATIVE Final    Comment: (NOTE) SARS-CoV-2 target nucleic acids are NOT DETECTED. The SARS-CoV-2 RNA is generally detectable in upper respiratoy specimens during the acute phase of infection. The lowest concentration of SARS-CoV-2 viral copies this assay can detect is 131  copies/mL. A negative result does not preclude SARS-Cov-2 infection and should not be used as the sole basis for treatment or other patient management decisions. A negative result may occur with  improper specimen collection/handling, submission of specimen other than nasopharyngeal swab, presence of viral mutation(s) within the areas targeted by this assay, and inadequate number of viral copies (<131 copies/mL). A negative result must be combined with clinical observations, patient history, and epidemiological information. The expected result is Negative. Fact Sheet for Patients:  PinkCheek.be Fact Sheet for Healthcare Providers:  GravelBags.it This test is not yet ap proved or cleared by the [place] FDA and  has been authorized for detection and/or diagnosis of SARS-CoV-2 by FDA under an Emergency Use Authorization (EUA). This EUA will remain  in effect (meaning this test can be used) for the duration of the COVID-19 declaration under Section 564(b)(1) of the Act, 21 U.S.C. section 360bbb-3(b)(1), unless the authorization is terminated or revoked sooner.    Influenza A by PCR NEGATIVE NEGATIVE Final   Influenza B by PCR NEGATIVE NEGATIVE Final    Comment: (NOTE) The Xpert Xpress SARS-CoV-2/FLU/RSV assay is intended as an aid in  the diagnosis of influenza from Nasopharyngeal swab specimens and  should not be used as a sole basis for treatment. Nasal washings and  aspirates are unacceptable for Xpert Xpress SARS-CoV-2/FLU/RSV  testing. Fact Sheet for Patients: PinkCheek.be Fact Sheet for Healthcare Providers: GravelBags.it This test is not yet approved or cleared by the [place] FDA and  has been authorized for detection and/or diagnosis of SARS-CoV-2 by  FDA under an Emergency Use Authorization (EUA). This EUA will remain  in effect (meaning this test can  be used) for the duration of the  Covid-19 declaration under Section 564(b)(1) of the Act, 21  U.S.C. section 360bbb-3(b)(1), unless the authorization is  terminated or revoked. Performed at [facility], [address] [place], [place] [address]   MRSA PCR Screening     Status: None   Collection Time: 08/29/19 12:16 AM   Specimen: Foot, Left; Nasopharyngeal  Result Value Ref Range Status   MRSA by PCR NEGATIVE NEGATIVE Final    Comment:        The GeneXpert MRSA Assay (FDA approved for NASAL specimens only), is one component of a comprehensive MRSA colonization surveillance program. It is not intended to diagnose MRSA infection nor to guide or monitor treatment for MRSA infections. Performed at [facility], [address] [place], [place] [address]   Blood Culture (routine x 2)     Status: None (Preliminary result)   Collection Time: 08/29/19  1:31 AM   Specimen: Leg; Blood  Result Value Ref Range Status   Specimen Description LEG  Final   Special Requests Blood Culture adequate volume  Final   Culture   Final    NO GROWTH < 12 HOURS Performed at  [address] [place], [place] [address]    Report Status PENDING  Incomplete    Coagulation Studies: No results for input(s): LABPROT, INR in the last 72 hours.  Urinalysis: No results for input(s): COLORURINE, LABSPEC, PHURINE, GLUCOSEU, HGBUR, BILIRUBINUR, KETONESUR, PROTEINUR, UROBILINOGEN, NITRITE, LEUKOCYTESUR in the last 72 hours.  Invalid input(s): APPERANCEUR    Imaging: DG Chest Port 1 View  Result Date: 08/28/2019 CLINICAL DATA:  Possible left foot infection with skin changes. EXAM: PORTABLE CHEST 1 VIEW COMPARISON:  Radiographs 02/16/2018 and 01/23/2018. FINDINGS: 1758 hours. Stable mild cardiomegaly and aortic atherosclerosis. There is a left subclavian vascular stent. Mild interstitial prominence in both lungs appears unchanged. No evidence of superimposed edema,  confluent airspace opacity, pneumothorax or significant pleural effusion. The bones appear unchanged. Telemetry leads overlie the chest. IMPRESSION: Stable chest with mild cardiomegaly and chronic interstitial prominence. No evidence of pneumonia. Electronically Signed   By: Richardean Sale M.D.   On: 08/28/2019 18:36   DG Foot Complete Left  Result Date: 08/28/2019 CLINICAL DATA:  Possible left foot infection. Progressive symptoms over the last 5 days with skin darkening of the toes and foul odor. EXAM: LEFT FOOT - COMPLETE 3+ VIEW COMPARISON:  None. FINDINGS: The bones appear adequately mineralized. There is no evidence of acute fracture or dislocation. The soft tissues of the 2nd toe appear atretic with possible exposure of the distal tuft. There are possible erosions of the distal tuft of the 2nd digit as well as the great toe. No other evidence of bone destruction. Extensive vascular calcifications are noted. There is mild dorsal forefoot soft tissue swelling without soft tissue emphysema or foreign body. IMPRESSION: Atretic soft tissues of the 2nd toe suspicious for infarction. Possible tuft erosions of the distal 1st and 2nd digits. These findings could reflect early osteomyelitis. Extensive vascular calcifications. Electronically Signed   By: Richardean Sale M.D.   On: 08/28/2019 18:40     Medications:   . sodium chloride 10 mL/hr at 08/29/19 0600  . piperacillin-tazobactam (ZOSYN)  IV    . vancomycin     . atorvastatin  20 mg Oral q1800  . Chlorhexidine Gluconate Cloth  6 each Topical Daily  . cholecalciferol  1,000 Units Oral q1800  . epoetin (EPOGEN/PROCRIT) injection  4,000 Units Intravenous Q T,Th,Sa-HD  . ferrous sulfate  325 mg Oral q1800  . gabapentin  100 mg Oral QPM  . heparin injection (subcutaneous)  5,000 Units Subcutaneous Q8H  . insulin aspart  0-15 Units Subcutaneous Q4H  . lidocaine-prilocaine   Topical Once  . mouth rinse  15 mL Mouth Rinse BID  . [START ON  08/30/2019] multivitamin with minerals  1 tablet Oral QHS  . polyvinyl alcohol  1 drop Left Eye QID  . [START ON 08/30/2019] sertraline  100 mg Oral QHS   albuterol, oxyCODONE-acetaminophen, polyethylene glycol  Assessment/ Plan:  Dennis [name] is a 69 y.o. black male with end stage renal disease on hemodialysis, hypertension, childhood polio, hyperlipidemia, CVA with left sided residual weakness, peripheral vascular disease, coronary artery disease, diabetes mellitus type II, diabetic neuropathy, congestive heart failure, asthma.   [facility] T[facility] 85kg Left AVF  1. End stage renal disease: seen and examined on hemodialysis treatment. Tolerating treatment well.   2. Anemia of chronic kidney disease: hemoglobin 10 - EPO with HD treatment  3. Secondary Hyperparathyroidism: with hyperphosphatemia, and hypocalcemia.  Outpatient labs 08/12/19 PTH elevated at 662. Phosphorus 10.1, calcium 7.1.  Does not  seem he is on any binders.   4. Hypotension: with septic shock. Required norepinephrine overnight. Holding home regimen of carvedilol.   5. Peripheral vascular disease: with gangrenous toes.  - appreciate vascular input.    LOS: 1 Dennis [name] 2/25/202110:17 AM

## 2019-08-29 NOTE — Progress Notes (Signed)
PROGRESS NOTE    Dennis [name]  [id number] DOB: 1951/04/14 DOA: 08/28/2019  PCP: Juluis Pitch, MD    LOS - 1   Brief Narrative:  Dennis [name] is a 69 y.o. male with extensive past medical history including hx of CAD s/p PCI to the proximal LAD, chronic HFpEF, stroke, PAD s/p right BKA, ESRD on HD Tues/Thurs/Sat, type 2 diabetes, childhood polio, blindness and asthma who presented to the ED from [facility] with concerns of gangrenous left second toe.  The toe had become darker and malodorous over few days prior.  Patient is followed closely outpatient by vascular surgery for severe PVD.  In the ED, febrile T-max 101 F, and hypotensive.  Labs showed leukocytosis of 17.6k and stable anemia.  He was treated with broad-spectrum antibiotics and required admission to ICU for vasopressors.  Weaned off pressors by next morning and care assumed again by hospitalist service.    Subjective 2/25: Patient seen and examined in stepdown after dialysis today.  He is sleeping comfortably but awakes to voice.  He does not speak, but will nod or shake his head.  Most verbalization is intermittent groaning.  Appears in no acute distress.  Off pressors since earlier this AM and maintain MAP.  Assessment & Plan:   Principal Problem:   Gangrenous toe ([place]) Active Problems:   Severe sepsis with septic shock (HCC)   Hypertension   ESRD (end stage renal disease) (HCC)   Chronic systolic heart failure (HCC)   Hypotension   Hx of BKA, right (HCC)   PVD (peripheral vascular disease) (HCC)   Severe sepsis secondary to gangrenous toe - present on admission as evidenced by fever, hypotension requiring vasopressor (due to ESRD to prevent volume overload) and infected gangrenous toe.  Vitals have stabilized, off pressors.  Given ESRD, patient received only 500 cc bolus in the ED.  Broad spectrum antibiotics started in ED.   --monitor vitals closely --vasopressor if needed to maintain MAP>65  Gangrenous  toe -present on admission, left second toe.  Patient is status post right BKA with severe peripheral vascular disease.  Expect he may require left BKA as toe does not appear salvageable. --Vascular surgery following --Continue Vanco and Zosyn --Consider ID consult, unless plan is for BKA  Hypertension - with hypotension on admission.  BP meds on hold. Of note - BP can only be checked on his LLE, given right BKA and bilateral upper extremity AV fistulas.  ESRD (end stage renal disease)  --nephrology following for dialysis  Chronic systolic heart failure - currently hypovolemic.   --beta blockers on hold due to hypotension, resume when BP tolerates  Hypotension - present on admission with sepsis. Resolved as above.  Monitor BP closely.  Hx of Right BKA - due to severe PVD.  Residual distal limb appears healthy.  PVD (peripheral vascular disease) - with prior amputation and now gangrenous left toe that may require left BKA as well.   Hold ASA and Plavix in case of surgery.   DVT prophylaxis: heparin   Code Status: Full Code  Family Communication: none at bedside   Disposition Plan:  Pending clinical course and clearance by vascular surgery.  Expect return to [facility]. Coming From SNF Exp DC Date TBD Barriers as above Medically Stable for Discharge? No   Consultants:   Vascular Surgery  Nephrology  Procedures:   None  Antimicrobials:   Vancomycin 2/25 >> TBD  Zosyn 2/25 >> TBD  Vanc/Cefepime/Flagyl - in ED 2/24  Objective: Vitals:   08/29/19 1215 08/29/19 1230 08/29/19 1245 08/29/19 1400  BP: 110/63 103/60 100/63 104/61  Pulse: 84 87 88   Resp: 15 16 18 18   Temp:   99.1 F (37.3 C)   TempSrc:   Oral   SpO2:    100%  Weight:      Height:        Intake/Output Summary (Last 24 hours) at 08/29/2019 1515 Last data filed at 08/29/2019 1245 Gross per 24 hour  Intake 94.7 ml  Output 500 ml  Net -405.3 ml   Filed Weights   08/28/19 1739 08/29/19  0528  Weight: 85 kg 80.8 kg    Examination:  General exam: sleeping, awakes to voice, no acute distress HEENT: blind, moist mucus membranes, hearing grossly normal  Respiratory system: exam limited by patient moaning but overall lungs sound clear with upper airway secretion sounds, no wheezes, rales or rhonchi, normal respiratory effort. Cardiovascular system: normal S1/S2, RRR, no pedal edema, right PermCath.   Gastrointestinal system: soft, NT, ND, no HSM felt, +bowel sounds. Central nervous system: No gross focal neurologic deficits, follow commands Extremities: Right BKA, left 2nd toe necrotic and malodorous, no lower extremity edema, normal tone Skin: dry, intact, normal temperature, left lower extremity ischemic skin changes   Data Reviewed: I have personally reviewed following labs and imaging studies  CBC: Recent Labs  Lab 08/28/19 1746 08/29/19 0518  WBC 17.6* 21.8*  NEUTROABS 15.1*  --   HGB 9.8* 10.0*  HCT 32.7* 32.2*  MCV 89.6 88.0  PLT 228 [id number]   Basic Metabolic Panel: Recent Labs  Lab 08/28/19 1746 08/29/19 0518  NA 135 137  K 3.6 3.9  CL 96* 97*  CO2 24 23  GLUCOSE 136* 148*  BUN 40* 45*  CREATININE 8.08* 8.66*  CALCIUM 8.2* 8.1*   GFR: Estimated Creatinine Clearance: 8.8 mL/min (A) (by C-G formula based on SCr of 8.66 mg/dL (H)). Liver Function Tests: Recent Labs  Lab 08/28/19 1746  AST 34  ALT 18  ALKPHOS 168*  BILITOT 1.6*  PROT 6.7  ALBUMIN 2.7*   No results for input(s): LIPASE, AMYLASE in the last 168 hours. No results for input(s): AMMONIA in the last 168 hours. Coagulation Profile: No results for input(s): INR, PROTIME in the last 168 hours. Cardiac Enzymes: No results for input(s): CKTOTAL, CKMB, CKMBINDEX, TROPONINI in the last 168 hours. BNP (last 3 results) No results for input(s): PROBNP in the last 8760 hours. HbA1C: Recent Labs    08/29/19 0518  HGBA1C 7.8*   CBG: Recent Labs  Lab 08/29/19 0009  GLUCAP 136*    Lipid Profile: No results for input(s): CHOL, HDL, LDLCALC, TRIG, CHOLHDL, LDLDIRECT in the last 72 hours. Thyroid Function Tests: No results for input(s): TSH, T4TOTAL, FREET4, T3FREE, THYROIDAB in the last 72 hours. Anemia Panel: No results for input(s): VITAMINB12, FOLATE, FERRITIN, TIBC, IRON, RETICCTPCT in the last 72 hours. Sepsis Labs: Recent Labs  Lab 08/28/19 1746 08/28/19 2137  PROCALCITON 2.05  --   LATICACIDVEN 0.8 0.8    Recent Results (from the past 240 hour(s))  Blood Culture (routine x 2)     Status: None (Preliminary result)   Collection Time: 08/28/19  5:46 PM   Specimen: BLOOD  Result Value Ref Range Status   Specimen Description BLOOD RESIDENT  Final   Special Requests   Final    BOTTLES DRAWN AEROBIC AND ANAEROBIC Blood Culture results may not be optimal due to an inadequate volume of blood  received in culture bottles   Culture   Final    NO GROWTH < 24 HOURS Performed at [facility], [address] [place], [place] [address]    Report Status PENDING  Incomplete  Respiratory Panel by RT PCR (Flu A&B, Covid) - Nasopharyngeal Swab     Status: None   Collection Time: 08/28/19  7:32 PM   Specimen: Nasopharyngeal Swab  Result Value Ref Range Status   SARS Coronavirus 2 by RT PCR NEGATIVE NEGATIVE Final    Comment: (NOTE) SARS-CoV-2 target nucleic acids are NOT DETECTED. The SARS-CoV-2 RNA is generally detectable in upper respiratoy specimens during the acute phase of infection. The lowest concentration of SARS-CoV-2 viral copies this assay can detect is 131 copies/mL. A negative result does not preclude SARS-Cov-2 infection and should not be used as the sole basis for treatment or other patient management decisions. A negative result may occur with  improper specimen collection/handling, submission of specimen other than nasopharyngeal swab, presence of viral mutation(s) within the areas targeted by this assay, and inadequate number of viral  copies (<131 copies/mL). A negative result must be combined with clinical observations, patient history, and epidemiological information. The expected result is Negative. Fact Sheet for Patients:  PinkCheek.be Fact Sheet for Healthcare Providers:  GravelBags.it This test is not yet ap proved or cleared by the [place] FDA and  has been authorized for detection and/or diagnosis of SARS-CoV-2 by FDA under an Emergency Use Authorization (EUA). This EUA will remain  in effect (meaning this test can be used) for the duration of the COVID-19 declaration under Section 564(b)(1) of the Act, 21 U.S.C. section 360bbb-3(b)(1), unless the authorization is terminated or revoked sooner.    Influenza A by PCR NEGATIVE NEGATIVE Final   Influenza B by PCR NEGATIVE NEGATIVE Final    Comment: (NOTE) The Xpert Xpress SARS-CoV-2/FLU/RSV assay is intended as an aid in  the diagnosis of influenza from Nasopharyngeal swab specimens and  should not be used as a sole basis for treatment. Nasal washings and  aspirates are unacceptable for Xpert Xpress SARS-CoV-2/FLU/RSV  testing. Fact Sheet for Patients: PinkCheek.be Fact Sheet for Healthcare Providers: GravelBags.it This test is not yet approved or cleared by the [place] FDA and  has been authorized for detection and/or diagnosis of SARS-CoV-2 by  FDA under an Emergency Use Authorization (EUA). This EUA will remain  in effect (meaning this test can be used) for the duration of the  Covid-19 declaration under Section 564(b)(1) of the Act, 21  U.S.C. section 360bbb-3(b)(1), unless the authorization is  terminated or revoked. Performed at [facility], [address] [place], [place] [address]   MRSA PCR Screening     Status: None   Collection Time: 08/29/19 12:16 AM   Specimen: Foot, Left; Nasopharyngeal  Result Value  Ref Range Status   MRSA by PCR NEGATIVE NEGATIVE Final    Comment:        The GeneXpert MRSA Assay (FDA approved for NASAL specimens only), is one component of a comprehensive MRSA colonization surveillance program. It is not intended to diagnose MRSA infection nor to guide or monitor treatment for MRSA infections. Performed at [facility], [address] [place], [place] [address]   Blood Culture (routine x 2)     Status: None (Preliminary result)   Collection Time: 08/29/19  1:31 AM   Specimen: Leg; Blood  Result Value Ref Range Status   Specimen Description LEG  Final   Special  Requests Blood Culture adequate volume  Final   Culture   Final    NO GROWTH < 12 HOURS Performed at [facility], [address] [place], [place] [address]    Report Status PENDING  Incomplete         Radiology Studies: DG Chest [facility] 1 View  Result Date: 08/28/2019 CLINICAL DATA:  Possible left foot infection with skin changes. EXAM: PORTABLE CHEST 1 VIEW COMPARISON:  Radiographs 02/16/2018 and 01/23/2018. FINDINGS: 1758 hours. Stable mild cardiomegaly and aortic atherosclerosis. There is a left subclavian vascular stent. Mild interstitial prominence in both lungs appears unchanged. No evidence of superimposed edema, confluent airspace opacity, pneumothorax or significant pleural effusion. The bones appear unchanged. Telemetry leads overlie the chest. IMPRESSION: Stable chest with mild cardiomegaly and chronic interstitial prominence. No evidence of pneumonia. Electronically Signed   By: Richardean Sale M.D.   On: 08/28/2019 18:36   DG Foot Complete Left  Result Date: 08/28/2019 CLINICAL DATA:  Possible left foot infection. Progressive symptoms over the last 5 days with skin darkening of the toes and foul odor. EXAM: LEFT FOOT - COMPLETE 3+ VIEW COMPARISON:  None. FINDINGS: The bones appear adequately mineralized. There is no evidence of acute fracture or dislocation. The soft  tissues of the 2nd toe appear atretic with possible exposure of the distal tuft. There are possible erosions of the distal tuft of the 2nd digit as well as the great toe. No other evidence of bone destruction. Extensive vascular calcifications are noted. There is mild dorsal forefoot soft tissue swelling without soft tissue emphysema or foreign body. IMPRESSION: Atretic soft tissues of the 2nd toe suspicious for infarction. Possible tuft erosions of the distal 1st and 2nd digits. These findings could reflect early osteomyelitis. Extensive vascular calcifications. Electronically Signed   By: Richardean Sale M.D.   On: 08/28/2019 18:40        Scheduled Meds: . atorvastatin  20 mg Oral q1800  . Chlorhexidine Gluconate Cloth  6 each Topical Daily  . cholecalciferol  1,000 Units Oral q1800  . ferrous sulfate  325 mg Oral q1800  . gabapentin  100 mg Oral QPM  . heparin injection (subcutaneous)  5,000 Units Subcutaneous Q8H  . insulin aspart  0-15 Units Subcutaneous Q4H  . mouth rinse  15 mL Mouth Rinse BID  . [START ON 08/30/2019] multivitamin with minerals  1 tablet Oral QHS  . polyvinyl alcohol  1 drop Left Eye QID  . [START ON 08/30/2019] sertraline  100 mg Oral QHS   Continuous Infusions: . sodium chloride 10 mL/hr at 08/29/19 0600  . piperacillin-tazobactam (ZOSYN)  IV 3.375 g (08/29/19 1401)  . vancomycin Stopped (08/29/19 1447)     LOS: 1 day    Time spent: 67 minutes    Ezekiel Slocumb, DO [facility]   If 7PM-7AM, please contact night-coverage [email or web address] 08/29/2019, 3:15 PM

## 2019-08-29 NOTE — Progress Notes (Signed)
Daughter said if unable to get hold her call pt ex wife to get hold of her daughter. Tobias Alexander [phone]

## 2019-08-29 NOTE — Progress Notes (Signed)
0920 down to Dialysis department via bed without monitoring.

## 2019-08-29 NOTE — Progress Notes (Signed)
Hemodialysis patient known at [facility] 6:00am. Please contact me with any dialysis placement concerns. 

## 2019-08-29 NOTE — Progress Notes (Signed)
Pharmacy Antibiotic Note  Dennis [name] is a 69 y.o. male admitted on 08/28/2019 with sepsis secondary to left toe infection/gangrene. 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 Patient with past medical history significant for CAD, CHF, Stroke, Right BKA, Diabetes, ESRD requiring dialysis Tuesday/Thursday/Saturday. Pharmacy has been consulted for Vancomycin dosing.  Plan: Patient received total vancomycin loading dose of 1750 mg on 2/26. Vancomycin random level obtain with am labs had little utility. Patient went to dialysis per regular schedule this am. Will continue vancomycin 1000 mg IV to be given with dialysis. Will plan to obtain level prior to dialysis session on 3/3.   During am rounds patient transitioned from cefepime to Zosyn.   Height: 5\' 11"  (180.3 cm) Weight: (unable to obtain) IBW/kg (Calculated) : 75.3  Temp (24hrs), Avg:99.3 F (37.4 C), Min:98.2 F (36.8 C), Max:101 F (38.3 C)  Recent Labs  Lab 08/28/19 1746 08/28/19 2137 08/29/19 0518  WBC 17.6*  --  21.8*  CREATININE 8.08*  --  8.66*  LATICACIDVEN 0.8 0.8  --   VANCORANDOM  --   --  28    Estimated Creatinine Clearance: 8.8 mL/min (A) (by C-G formula based on SCr of 8.66 mg/dL (H)).    Allergies  Allergen Reactions  . Cefuroxime Itching  . Shrimp [Shellfish Allergy] Other (See Comments)    Per mar. Unknown reaction   . Sulfa Antibiotics Itching and Rash    Antimicrobials this admission: Cefepime 2/24 x 1.  Metronidazole 2/24 x 1 Vancomycin 2/24 >>  Zosyn 2/25 >>  Dose adjustments this admission: N/A  Microbiology results: 2/24 BCx: no growth < 12 hours  2/25 MRSA PCR: negative  2/25 SARS Coronavirus: negative  2/25 Influenza A/B: negative   Thank you for allowing pharmacy to be a part of this patient's care.  Elianis Fischbach L 08/29/2019 2:28 PM

## 2019-08-29 NOTE — Procedures (Signed)
Central Venous Catheter Insertion Procedure Note ABDULKAREEM GOODLY [id number] 11/13/1950  Procedure: Insertion of Central Venous Catheter Indications: Assessment of intravascular volume, Drug and/or fluid administration and Frequent blood sampling  Procedure Details Consent: Unable to obtain consent because of emergent medical necessity. Time Out: Verified patient identification, verified procedure, site/side was marked, verified correct patient position, special equipment/implants available, medications/allergies/relevent history reviewed, required imaging and test results available.  Performed  Maximum sterile technique was used including antiseptics, cap, gloves, gown, hand hygiene, mask and sheet. Skin prep: Chlorhexidine; local anesthetic administered A antimicrobial bonded/coated triple lumen catheter was placed in the right femoral vein due to patient being a dialysis patient using the Seldinger technique.  Evaluation Blood flow good Complications: No apparent complications Patient did tolerate procedure well. Chest X-ray ordered to verify placement.  CXR: not indicated .  Right femoral central line placed utilizing ultrasound no complications noted during or following procedure.  Marda Stalker, [place] Pager [phone] (please enter 7 digits) PCCM Consult Pager [phone] (please enter 7 digits)

## 2019-08-29 NOTE — Consult Note (Signed)
Consultation Note Date: 08/29/2019   Patient Name: Dennis [name]  DOB: 08-04-1950  MRN: [id number]  Age / Sex: 69 y.o., male  PCP: Dennis Pitch, MD Referring Physician: Ezekiel Slocumb, DO  Reason for Consultation: Establishing goals of care  HPI/Patient Profile: 69 y.o. male  with past medical history of CVA, DM2, limb ischmia, ESRD on HD, severe PAD and PVD, debilitated and living at [facility]- admitted on 08/28/2019 with ongoing weakness, altered mental status, hypotension. Workup reveals sepsis r/t gangrenous lower extremity- Palliative medicine consulted for [place].  Clinical Assessment and Goals of Care: Reviewed chart, evaluated patient. Patient in bed, nonverbal- only moans to any stimulation.  Met in conference with patient's daughter Dennis [name]. Dennis [name] is patient's designated HCPOA- paperwork reviewed and placed on chart. Dennis [name] is alternate HCPOA.  Patient is known for being loving, generous, loves laughing and being with family. The past year has been difficult due to him being separated from family visits due to Covid. Dennis [name] has been able to visit through windows and talk to him over phone- but no in person visits.  Prior to admission patient is bedbound- unable to transfer on his own, requires assistance for all ADL's. He does find joy in frequent phone calls with his siblings, and frequent window visits from Dennis [name] and her children. Patient's current health status, co- morbidities and possible trajectories were discussed. Dennis [name] is very aware of patient's overall poor health status. In her recent discussions with him he has expressed he is tired, and he is tired of the place where he is. She believes his quality of life is not what he would prefer.  Options of continued aggressive care including surgical amputation of his lower extremity vs comfort care was discussed.  Dennis [name] is very  conflicted- she believes that comfort care may be what he would choose, however, she has a difficult time thinking of letting him go. There is additional pressure from outside family members in [place] (who are farther removed from the situation) for aggressive care.  We discussed if amputation and continued aggressive medical care would improve his quality of life. There is concern that the amputation and further aggressive care would add to his suffering- he is at risk for poor wound healing, he is already bedbound, he has overall severe PVD and PAD, along with CAD, and ESRD on HD- all which will likely lead to further decline and possibly more amputations. Dennis [name] notes that patient's prior amputation was 20 years ago- patient was otherwise functional and was very happy to have amputation as it definitely improved his quality of life. However, there is a great push from her family members to proceed with amputation- they do not hear patient talk of suffering as Dennis [name] does.  Code status was also discussed- for now Dennis [name] wishes for full code status. Her goal would be for patient to be kept alive long enough for her to arrive at hospital.  Primary Decision Maker HCPOA- daughter- Dennis [name] -Full  scope, Full code -PMT will continue to follow -Dennis [name] requested followup call from surgeon or RN with surgery time and allowance for her to be present before and during surgery if they are before visiting hours allow -Recommend close f/u with Dennis [name] by primary team prior to surgery for surgery and DNR decision     Code Status/Advance Care Planning:  Full code  Prognosis:    Unable to determine  Discharge Planning: To Be Determined  Primary Diagnoses: Present on Admission: . Gangrenous toe ([place]) . ESRD (end stage renal disease) ([place]) . Chronic systolic heart failure ([place]) . PVD (peripheral vascular disease) ([place]) . Hypertension . Severe sepsis ([place])   I  have reviewed the medical record, interviewed the patient and family, and examined the patient. The following aspects are pertinent.  Past Medical History:  Diagnosis Date  . Anginal pain ([place])   . Anxiety   . Arthritis   . Asthma   . CHF (congestive heart failure) ([place])   . Coronary artery disease   . Depression   . Diabetes mellitus without complication ([place])   . Dyspnea   . ESRD (end stage renal disease) ([place])    On Tuesday, Thursday and Saturday dialysis  . Heart murmur   . Hyperlipidemia   . Hypertension   . Myocardial infarction ([place])   . Peripheral vascular disease ([place])   . Polio    childhood  . Stroke [facility])    Social History   Socioeconomic History  . Marital status: Widowed    Spouse name: Not on file  . Number of children: Not on file  . Years of education: Not on file  . Highest education level: Not on file  Occupational History  . Occupation: disabled  Tobacco Use  . Smoking status: Never Smoker  . Smokeless tobacco: Never Used  Substance and Sexual Activity  . Alcohol use: No  . Drug use: Yes    Types: "Crack" cocaine    Comment: Quit using cocaine 5 years ago  . Sexual activity: Not Currently  Other Topics Concern  . Not on file  Social History Narrative   Bedbound at baseline, currently from a nursing home ([facility])   Social Determinants of Health   Financial Resource Strain:   . Difficulty of Paying Living Expenses: Not on file  Food Insecurity:   . Worried About Charity fundraiser in the Last Year: Not on file  . Ran Out of Food in the Last Year: Not on file  Transportation Needs:   . Lack of Transportation (Medical): Not on file  . Lack of Transportation (Non-Medical): Not on file  Physical Activity:   . Days of Exercise per Week: Not on file  . Minutes of Exercise per Session: Not on file  Stress:   . Feeling of Stress : Not on file  Social Connections:   . Frequency of Communication with Friends and Family: Not on file  . Frequency  of Social Gatherings with Friends and Family: Not on file  . Attends Religious Services: Not on file  . Active Member of Clubs or Organizations: Not on file  . Attends Archivist Meetings: Not on file  . Marital Status: Not on file   Family History  Problem Relation Age of Onset  . Cancer Mother   . Stroke Father   . Diabetes Neg Hx   . Hypertension Neg Hx    Scheduled Meds: . atorvastatin  20 mg Oral q1800  . Chlorhexidine Gluconate Cloth  6 each Topical Daily  . cholecalciferol  1,000 Units Oral q1800  . ferrous sulfate  325 mg Oral q1800  . gabapentin  100 mg Oral QPM  . heparin injection (subcutaneous)  5,000 Units Subcutaneous Q8H  . insulin aspart  0-15 Units Subcutaneous Q4H  . mouth rinse  15 mL Mouth Rinse BID  . [START ON 08/30/2019] multivitamin with minerals  1 tablet Oral QHS  . polyvinyl alcohol  1 drop Left Eye QID  . [START ON 08/30/2019] sertraline  100 mg Oral QHS   Continuous Infusions: . sodium chloride 10 mL/hr at 08/29/19 0600  . piperacillin-tazobactam (ZOSYN)  IV 3.375 g (08/29/19 1401)  . vancomycin Stopped (08/29/19 1447)   PRN Meds:.acetaminophen, albuterol, oxyCODONE-acetaminophen, polyethylene glycol Medications Prior to Admission:  Prior to Admission medications   Medication Sig Start Date End Date Taking? Authorizing Provider  acetaminophen (TYLENOL) 500 MG tablet Take 500 mg by mouth every 4 (four) hours as needed for mild pain or fever.   Yes [provider]  albuterol (VENTOLIN HFA) 108 (90 Base) MCG/ACT inhaler Inhale 2 puffs into the lungs every 4 (four) hours as needed for wheezing or shortness of breath.   Yes [provider]  aspirin EC 81 MG tablet Take 1 tablet (81 mg total) by mouth daily. Patient taking differently: Take 81 mg by mouth daily at 6 PM.  08/14/19  Yes End, Harrell Gave, MD  atorvastatin (LIPITOR) 10 MG tablet Take 20 mg by mouth daily at 6 PM.  07/17/18  Yes [provider]  b complex  vitamins tablet Take 1 tablet by mouth daily at 6 PM.   Yes [provider]  benzocaine (ORAJEL MAXIMUM STRENGTH) 20 % GEL Use as directed 1 application in the mouth or throat 4 (four) times daily as needed (pain).    Yes [provider]  Cholecalciferol (D3-1000) 1000 units tablet Take 1,000 Units by mouth daily at 6 PM.    Yes [provider]  clopidogrel (PLAVIX) 75 MG tablet Take 75 mg by mouth daily at 6 PM.   Yes [provider]  ferrous sulfate 325 (65 FE) MG tablet Take 325 mg by mouth daily at 6 PM.   Yes [provider]  gabapentin (NEURONTIN) 100 MG capsule Take 1 capsule (100 mg total) by mouth every evening. Patient taking differently: Take 200 mg by mouth 2 (two) times daily.  12/29/17  Yes Vaughan Basta, MD  ibuprofen (ADVIL,MOTRIN) 200 MG tablet Take 400 mg by mouth every 6 (six) hours as needed for moderate pain.    Yes [provider]  lactulose (CHRONULAC) 10 GM/15ML solution Take 20 g by mouth at bedtime.    Yes [provider]  lidocaine-prilocaine (EMLA) cream Apply 1 application topically Every Tuesday,Thursday,and Saturday with dialysis.    Yes [provider]  linagliptin (TRADJENTA) 5 MG TABS tablet Take 5 mg by mouth daily.   Yes [provider]  lisinopril (PRINIVIL,ZESTRIL) 10 MG tablet Take 1 tablet (10 mg total) by mouth daily at 6 PM. Patient taking differently: Take 20 mg by mouth daily at 6 PM.  02/23/18  Yes Demetrios Loll, MD  metoprolol succinate (TOPROL-XL) 25 MG 24 hr tablet Take 25 mg by mouth daily at 6 PM.    Yes [provider]  Multiple Vitamin (MULTIVITAMIN) tablet Take 1 tablet by mouth at bedtime.    Yes [provider]  nitroGLYCERIN (NITROSTAT) 0.4 MG SL tablet Place 0.4 mg under the tongue every  5 (five) minutes as needed for chest pain.   Yes [provider]  oxyCODONE (OXY IR/ROXICODONE) 5 MG immediate release tablet Take 5 mg by mouth 3  (three) times daily as needed for moderate pain.  08/14/18  Yes [provider]  Polyethyl Glyc-Propyl Glyc PF (SYSTANE ULTRA PF) 0.4-0.3 % SOLN Place 1 drop into the left eye 4 (four) times daily.   Yes [provider]  polyethylene glycol (MIRALAX / GLYCOLAX) packet Take 17 g by mouth daily at 6 PM.    Yes [provider]  Polyvinyl Alcohol-Povidone 5-6 MG/ML SOLN Place 1 drop into the left eye 4 (four) times daily.   Yes [provider]  saccharomyces boulardii (FLORASTOR) 250 MG capsule Take 250 mg by mouth daily.   Yes [provider]  sertraline (ZOLOFT) 100 MG tablet Take 100 mg by mouth at bedtime.    Yes [provider]  sevelamer carbonate (RENVELA) 800 MG tablet Take 800 mg by mouth 2 (two) times daily. (with snacks)   Yes [provider]  sevelamer carbonate (RENVELA) 800 MG tablet Take 1,600 mg by mouth 3 (three) times daily with meals.   Yes [provider]  Skin Protectants, Misc. (EUCERIN) cream Apply 1 application topically 2 (two) times daily as needed (rash).    Yes [provider]  oxyCODONE-acetaminophen (PERCOCET) 5-325 MG tablet Take 1 tablet by mouth every 6 (six) hours as needed for severe pain. 08/07/19 08/06/20  Schnier, Dolores Lory, MD   Allergies  Allergen Reactions  . Cefuroxime Itching  . Shrimp [Shellfish Allergy] Other (See Comments)    Per mar. Unknown reaction   . Sulfa Antibiotics Itching and Rash   Review of Systems  Physical Exam Vitals and nursing note reviewed.  Constitutional:      Appearance: He is ill-appearing and toxic-appearing.     Comments: Gangrenous odor present in room  Cardiovascular:     Rate and Rhythm: Normal rate.  Pulmonary:     Effort: Pulmonary effort is normal.  Neurological:     Comments: Nonverbal- UTA orientation     Vital Signs: BP (!) 91/57   Pulse 93   Temp (!) 102.2 F (39 C) (Axillary)   Resp (!) 24   Ht '5\' 11"'$  (1.803 m)   Wt 80.8 kg    SpO2 100%   BMI 24.84 kg/m  Pain Scale: 0-10 POSS *See Group Information*: S-Acceptable,Sleep, easy to arouse Pain Score: 2    SpO2: SpO2: 100 % O2 Device:SpO2: 100 % O2 Flow Rate: .O2 Flow Rate (L/min): 2 L/min  IO: Intake/output summary:   Intake/Output Summary (Last 24 hours) at 08/29/2019 1814 Last data filed at 08/29/2019 1245 Gross per 24 hour  Intake 94.7 ml  Output 500 ml  Net -405.3 ml    LBM: Last BM Date: 08/28/19 Baseline Weight: Weight: 85 kg Most recent weight: Weight: (unable to obtain)     Palliative Assessment/Data: PPS: 10%     Thank you for this consult. Palliative medicine will continue to follow and assist as needed.   Time In: 1600 Time Out: 1830 Time Total: 150 minutes Prolonged time: yes Greater than 50%  of this time was spent counseling and coordinating care related to the above assessment and plan.  Signed by: Mariana Kaufman, AGNP-C Palliative Medicine    Please contact Palliative Medicine Team phone at [phone] for questions and concerns.  For individual provider: See Shea Evans

## 2019-08-29 NOTE — Progress Notes (Addendum)
Palliative-   Consult received- chart reviewed. Attempted to call patient's daughter- Theodoro Grist- mailbox was full. Left message for LaToya on Thelma Hunter's voicemail per previous RN note stating to call Tobias Alexander to reach [facility] if needed. Requested return call for [place] meeting.   Mariana Kaufman, AGNP-C Palliative Medicine  Please call Palliative Medicine team phone with any questions [phone]. For individual providers please see AMION.  No charge

## 2019-08-29 NOTE — Progress Notes (Signed)
PHARMACY - PHYSICIAN COMMUNICATION CRITICAL VALUE ALERT - BLOOD CULTURE IDENTIFICATION (BCID)  Results for orders placed or performed during the hospital encounter of 02/16/18  Blood Culture ID Panel (Reflexed) (Collected: 02/16/2018 11:46 AM)  Result Value Ref Range   Enterococcus species NOT DETECTED NOT DETECTED   Listeria monocytogenes NOT DETECTED NOT DETECTED   Staphylococcus species DETECTED (A) NOT DETECTED   Staphylococcus aureus (BCID) DETECTED (A) NOT DETECTED   Methicillin resistance DETECTED (A) NOT DETECTED   Streptococcus species NOT DETECTED NOT DETECTED   Streptococcus agalactiae NOT DETECTED NOT DETECTED   Streptococcus pneumoniae NOT DETECTED NOT DETECTED   Streptococcus pyogenes NOT DETECTED NOT DETECTED   Acinetobacter baumannii NOT DETECTED NOT DETECTED   Enterobacteriaceae species NOT DETECTED NOT DETECTED   Enterobacter cloacae complex NOT DETECTED NOT DETECTED   Escherichia coli NOT DETECTED NOT DETECTED   Klebsiella oxytoca NOT DETECTED NOT DETECTED   Klebsiella pneumoniae NOT DETECTED NOT DETECTED   Proteus species NOT DETECTED NOT DETECTED   Serratia marcescens NOT DETECTED NOT DETECTED   Haemophilus influenzae NOT DETECTED NOT DETECTED   Neisseria meningitidis NOT DETECTED NOT DETECTED   Pseudomonas aeruginosa NOT DETECTED NOT DETECTED   Candida albicans NOT DETECTED NOT DETECTED   Candida glabrata NOT DETECTED NOT DETECTED   Candida krusei NOT DETECTED NOT DETECTED   Candida parapsilosis NOT DETECTED NOT DETECTED   Candida tropicalis NOT DETECTED NOT DETECTED    Name of physician (or Provider) Contacted: Dennis Stalker, NP   Changes to prescribed antibiotics required: no, will continue vanc and zosyn   Dennis [name] D 08/29/2019  7:32 PM

## 2019-08-29 NOTE — Clinical Social Work Note (Signed)
CSW acknowledges SNF consult. Patient is a long-term resident at [facility]. Will continue to follow for disposition needs. Palliative consulted for Dennis [name] discussion.  Dayton Scrape, [place]

## 2019-08-30 ENCOUNTER — Encounter
Admission: EM | Disposition: A | Discharge status: Skilled Nursing Facility | Payer: Self-pay | Source: Home / Self Care | Attending: Internal Medicine

## 2019-08-30 ENCOUNTER — Other Ambulatory Visit (INDEPENDENT_AMBULATORY_CARE_PROVIDER_SITE_OTHER): Payer: Self-pay | Admitting: Vascular Surgery

## 2019-08-30 DIAGNOSIS — Z8739 Personal history of other diseases of the musculoskeletal system and connective tissue: Secondary | ICD-10-CM

## 2019-08-30 DIAGNOSIS — I2510 Atherosclerotic heart disease of native coronary artery without angina pectoris: Secondary | ICD-10-CM

## 2019-08-30 DIAGNOSIS — I70262 Atherosclerosis of native arteries of extremities with gangrene, left leg: Secondary | ICD-10-CM

## 2019-08-30 DIAGNOSIS — Z881 Allergy status to other antibiotic agents status: Secondary | ICD-10-CM

## 2019-08-30 DIAGNOSIS — N186 End stage renal disease: Secondary | ICD-10-CM

## 2019-08-30 DIAGNOSIS — G8194 Hemiplegia, unspecified affecting left nondominant side: Secondary | ICD-10-CM

## 2019-08-30 DIAGNOSIS — A419 Sepsis, unspecified organism: Secondary | ICD-10-CM

## 2019-08-30 DIAGNOSIS — Z515 Encounter for palliative care: Secondary | ICD-10-CM

## 2019-08-30 DIAGNOSIS — E785 Hyperlipidemia, unspecified: Secondary | ICD-10-CM

## 2019-08-30 DIAGNOSIS — Z7189 Other specified counseling: Secondary | ICD-10-CM

## 2019-08-30 DIAGNOSIS — E1152 Type 2 diabetes mellitus with diabetic peripheral angiopathy with gangrene: Secondary | ICD-10-CM

## 2019-08-30 DIAGNOSIS — Z992 Dependence on renal dialysis: Secondary | ICD-10-CM

## 2019-08-30 DIAGNOSIS — I120 Hypertensive chronic kidney disease with stage 5 chronic kidney disease or end stage renal disease: Secondary | ICD-10-CM

## 2019-08-30 DIAGNOSIS — Z9862 Peripheral vascular angioplasty status: Secondary | ICD-10-CM

## 2019-08-30 DIAGNOSIS — I739 Peripheral vascular disease, unspecified: Secondary | ICD-10-CM

## 2019-08-30 DIAGNOSIS — I96 Gangrene, not elsewhere classified: Secondary | ICD-10-CM

## 2019-08-30 DIAGNOSIS — H5461 Unqualified visual loss, right eye, normal vision left eye: Secondary | ICD-10-CM

## 2019-08-30 DIAGNOSIS — Z89511 Acquired absence of right leg below knee: Secondary | ICD-10-CM

## 2019-08-30 DIAGNOSIS — Z8614 Personal history of Methicillin resistant Staphylococcus aureus infection: Secondary | ICD-10-CM

## 2019-08-30 DIAGNOSIS — I5022 Chronic systolic (congestive) heart failure: Secondary | ICD-10-CM

## 2019-08-30 DIAGNOSIS — E1122 Type 2 diabetes mellitus with diabetic chronic kidney disease: Secondary | ICD-10-CM

## 2019-08-30 DIAGNOSIS — Z91013 Allergy to seafood: Secondary | ICD-10-CM

## 2019-08-30 HISTORY — PX: LOWER EXTREMITY ANGIOGRAPHY: CATH118251

## 2019-08-30 LAB — CBC WITH DIFFERENTIAL/PLATELET
Abs Immature Granulocytes: 0.2 10*3/uL — ABNORMAL HIGH (ref 0.00–0.07)
Basophils Absolute: 0.1 10*3/uL (ref 0.0–0.1)
Basophils Relative: 0 %
Eosinophils Absolute: 0.1 10*3/uL (ref 0.0–0.5)
Eosinophils Relative: 1 %
HCT: 30.9 % — ABNORMAL LOW (ref 39.0–52.0)
Hemoglobin: 9.9 g/dL — ABNORMAL LOW (ref 13.0–17.0)
Immature Granulocytes: 1 %
Lymphocytes Relative: 6 %
Lymphs Abs: 1.3 10*3/uL (ref 0.7–4.0)
MCH: 27.3 pg (ref 26.0–34.0)
MCHC: 32 g/dL (ref 30.0–36.0)
MCV: 85.1 fL (ref 80.0–100.0)
Monocytes Absolute: 1.2 10*3/uL — ABNORMAL HIGH (ref 0.1–1.0)
Monocytes Relative: 6 %
Neutro Abs: 17.5 10*3/uL — ABNORMAL HIGH (ref 1.7–7.7)
Neutrophils Relative %: 86 %
Platelets: 248 10*3/uL (ref 150–400)
RBC: 3.63 MIL/uL — ABNORMAL LOW (ref 4.22–5.81)
RDW: 17 % — ABNORMAL HIGH (ref 11.5–15.5)
WBC: 20.3 10*3/uL — ABNORMAL HIGH (ref 4.0–10.5)
nRBC: 0 % (ref 0.0–0.2)

## 2019-08-30 LAB — BASIC METABOLIC PANEL
Anion gap: 16 — ABNORMAL HIGH (ref 5–15)
BUN: 31 mg/dL — ABNORMAL HIGH (ref 8–23)
CO2: 25 mmol/L (ref 22–32)
Calcium: 8.3 mg/dL — ABNORMAL LOW (ref 8.9–10.3)
Chloride: 99 mmol/L (ref 98–111)
Creatinine, Ser: 6.42 mg/dL — ABNORMAL HIGH (ref 0.61–1.24)
GFR calc Af Amer: 9 mL/min — ABNORMAL LOW (ref >60)
GFR calc non Af Amer: 8 mL/min — ABNORMAL LOW (ref >60)
Glucose, Bld: 115 mg/dL — ABNORMAL HIGH (ref 70–99)
Potassium: 3.4 mmol/L — ABNORMAL LOW (ref 3.5–5.1)
Sodium: 140 mmol/L (ref 135–145)

## 2019-08-30 LAB — GLUCOSE, CAPILLARY
Glucose-Capillary: 101 mg/dL — ABNORMAL HIGH (ref 70–99)
Glucose-Capillary: 102 mg/dL — ABNORMAL HIGH (ref 70–99)
Glucose-Capillary: 116 mg/dL — ABNORMAL HIGH (ref 70–99)
Glucose-Capillary: 129 mg/dL — ABNORMAL HIGH (ref 70–99)
Glucose-Capillary: 162 mg/dL — ABNORMAL HIGH (ref 70–99)
Glucose-Capillary: 79 mg/dL (ref 70–99)
Glucose-Capillary: 87 mg/dL (ref 70–99)

## 2019-08-30 LAB — PHOSPHORUS: Phosphorus: 3.2 mg/dL (ref 2.5–4.6)

## 2019-08-30 LAB — MAGNESIUM: Magnesium: 2.1 mg/dL (ref 1.7–2.4)

## 2019-08-30 SURGERY — LOWER EXTREMITY ANGIOGRAPHY
Anesthesia: Moderate Sedation | Laterality: Left

## 2019-08-30 MED ORDER — CLINDAMYCIN PHOSPHATE 300 MG/50ML IV SOLN: 300.0000 mg | Freq: Once | INTRAVENOUS | Status: DC

## 2019-08-30 MED ORDER — MIDAZOLAM HCL 2 MG/2ML IJ SOLN
INTRAMUSCULAR | Status: DC | PRN
  Administered 2019-08-30: 1 mg via INTRAVENOUS

## 2019-08-30 MED ORDER — MIDAZOLAM HCL 5 MG/5ML IJ SOLN
INTRAMUSCULAR | Status: AC
  Filled 2019-08-30: qty 5

## 2019-08-30 MED ORDER — IODIXANOL 320 MG/ML IV SOLN
INTRAVENOUS | Status: DC | PRN
  Administered 2019-08-30: 35 mL via INTRA_ARTERIAL

## 2019-08-30 MED ORDER — FENTANYL CITRATE (PF) 100 MCG/2ML IJ SOLN
INTRAMUSCULAR | Status: AC
  Filled 2019-08-30: qty 2

## 2019-08-30 MED ORDER — FENTANYL CITRATE (PF) 100 MCG/2ML IJ SOLN
INTRAMUSCULAR | Status: DC | PRN
  Administered 2019-08-30: 25 ug via INTRAVENOUS

## 2019-08-30 MED ORDER — METHYLPREDNISOLONE SODIUM SUCC 125 MG IJ SOLR
INTRAMUSCULAR | Status: AC
  Administered 2019-08-30: 15:00:00 125 mg via INTRAVENOUS
  Filled 2019-08-30: qty 2

## 2019-08-30 MED ORDER — FAMOTIDINE 20 MG PO TABS: 40.0000 mg | ORAL_TABLET | Freq: Once | ORAL | Status: AC | PRN

## 2019-08-30 MED ORDER — DIPHENHYDRAMINE HCL 50 MG/ML IJ SOLN: 50.0000 mg | Freq: Once | INTRAMUSCULAR | Status: AC | PRN

## 2019-08-30 MED ORDER — CLINDAMYCIN PHOSPHATE 300 MG/50ML IV SOLN
INTRAVENOUS | Status: AC
  Filled 2019-08-30: qty 50

## 2019-08-30 MED ORDER — HEPARIN SODIUM (PORCINE) 1000 UNIT/ML IJ SOLN
INTRAMUSCULAR | Status: AC
  Filled 2019-08-30: qty 1

## 2019-08-30 MED ORDER — ONDANSETRON HCL 4 MG/2ML IJ SOLN: 4.0000 mg | Freq: Four times a day (QID) | INTRAMUSCULAR | Status: DC | PRN

## 2019-08-30 MED ORDER — SODIUM CHLORIDE 0.9 % IV SOLN: INTRAVENOUS | Status: DC

## 2019-08-30 MED ORDER — DIPHENHYDRAMINE HCL 50 MG/ML IJ SOLN
INTRAMUSCULAR | Status: AC
  Administered 2019-08-30: 15:00:00 25 mg via INTRAVENOUS
  Filled 2019-08-30: qty 1

## 2019-08-30 MED ORDER — SODIUM CHLORIDE 0.9% FLUSH: 10.0000 mL | INTRAVENOUS | Status: DC | PRN

## 2019-08-30 MED ORDER — OXYCODONE-ACETAMINOPHEN 5-325 MG PO TABS
1.0000 | ORAL_TABLET | ORAL | Status: DC | PRN
  Administered 2019-08-30 – 2019-09-02 (×5): 1 via ORAL
  Filled 2019-08-30 (×5): qty 1

## 2019-08-30 MED ORDER — BISACODYL 5 MG PO TBEC
5.0000 mg | DELAYED_RELEASE_TABLET | Freq: Every day | ORAL | Status: DC | PRN
  Administered 2019-09-05: 5 mg via ORAL
  Filled 2019-08-30 (×2): qty 1

## 2019-08-30 MED ORDER — FAMOTIDINE 20 MG PO TABS
ORAL_TABLET | ORAL | Status: AC
  Administered 2019-08-30: 15:00:00 40 mg via ORAL
  Filled 2019-08-30: qty 1

## 2019-08-30 MED ORDER — HYDROMORPHONE HCL 1 MG/ML IJ SOLN
1.0000 mg | Freq: Once | INTRAMUSCULAR | Status: AC | PRN
  Administered 2019-09-01: 1 mg via INTRAVENOUS
  Filled 2019-08-30: qty 1

## 2019-08-30 MED ORDER — MIDAZOLAM HCL 2 MG/ML PO SYRP: 8.0000 mg | ORAL_SOLUTION | Freq: Once | ORAL | Status: DC | PRN

## 2019-08-30 MED ORDER — SODIUM CHLORIDE 0.9% FLUSH
10.0000 mL | Freq: Two times a day (BID) | INTRAVENOUS | Status: DC
  Administered 2019-08-30 – 2019-09-01 (×4): 10 mL
  Administered 2019-09-01: 12:00:00 30 mL
  Administered 2019-09-02 – 2019-09-04 (×6): 10 mL
  Administered 2019-09-05: 20 mL

## 2019-08-30 MED ORDER — METHYLPREDNISOLONE SODIUM SUCC 125 MG IJ SOLR: 125.0000 mg | Freq: Once | INTRAMUSCULAR | Status: AC | PRN

## 2019-08-30 SURGICAL SUPPLY — 6 items
CATH PIG 70CM (CATHETERS) ×1 IMPLANT
DEVICE STARCLOSE SE CLOSURE (Vascular Products) ×1 IMPLANT
GLIDEWIRE ANGLED SS 035X260CM (WIRE) ×1 IMPLANT
PACK ANGIOGRAPHY (CUSTOM PROCEDURE TRAY) ×2 IMPLANT
SHEATH BRITE TIP 5FRX11 (SHEATH) ×1 IMPLANT
WIRE J 3MM .035X145CM (WIRE) ×1 IMPLANT

## 2019-08-30 NOTE — Progress Notes (Signed)
[facility] Communication Note  Met with the patient and his daughter who was at the bedside. We had a discussion concerning the left foot that is not salvageable.  Daughter would like to pursue amputation.  Patient is status post right BKA.  He has not walked in over a year even though he does have a prosthetic.  We discussed the high complication rate due to contracture with a BKA in a bedbound patient.  Patient's daughter would like to pursue BKA with the hope that the patient will receive physical therapy after the surgery.  We will proceed with an angiogram today to assess the patient's arterial status and if appropriate an attempt to revascularize the leg to be able to heal a BKA versus an AKA.   Marcelle Overlie PA-C 08/30/2019 12:06 PM
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
## 2019-08-30 NOTE — Progress Notes (Signed)
Pharmacy Antibiotic Note  Dennis Walker is a 69 y.o. male admitted on 08/28/2019 with sepsis secondary to left toe infection/gangrene. 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 Patient with past medical history significant for CAD, CHF, Stroke, Right BKA, Diabetes, ESRD requiring dialysis Tuesday/Thursday/Saturday. Coagulase negative staph growing in 1/4 blood cultures. Patient having angiogram today, 2/26, prior to proceeding to amputation.   Pharmacy has been consulted for Vancomycin dosing.  Plan: Continue vancomycin 1000 mg IV to be given with dialysis. Next scheduled dialysis is 2/27. Will plan to obtain level prior to dialysis session on 3/3.   Zosyn EI 3.375g IV Q12hr.   ID Consult pending.   Height: 5\' 11"  (180.3 cm) Weight: (unable to obtain) IBW/kg (Calculated) : 75.3  Temp (24hrs), Avg:100 F (37.8 C), Min:98.1 F (36.7 C), Max:102.2 F (39 C)  Recent Labs  Lab 08/28/19 1746 08/28/19 2137 08/29/19 0518 08/30/19 0511  WBC 17.6*  --  21.8* 20.3*  CREATININE 8.08*  --  8.66* 6.42*  LATICACIDVEN 0.8 0.8  --   --   VANCORANDOM  --   --  28  --     Estimated Creatinine Clearance: 11.9 mL/min (A) (by C-G formula based on SCr of 6.42 mg/dL (H)).    Allergies  Allergen Reactions  . Cefuroxime Itching  . Shrimp [Shellfish Allergy] Other (See Comments)    Per mar. Unknown reaction   . Sulfa Antibiotics Itching and Rash    Antimicrobials this admission: Cefepime 2/24 x 1.  Metronidazole 2/24 x 1 Vancomycin 2/24 >>  Zosyn 2/25 >>  Dose adjustments this admission: N/A  Microbiology results: 2/24 BCx: coag negative staph 1/4 2/25 MRSA PCR: negative  2/25 SARS Coronavirus: negative  2/25 Influenza A/B: negative   Thank you for allowing pharmacy to be a part of this patient's care.  Dennis Walker L 08/30/2019 3:11 PM

## 2019-08-30 NOTE — H&P (Signed)
[facility] History & Physical Update  The patient was interviewed and re-examined.  The patient's previous History and Physical has been reviewed and is unchanged.  There is no change in the plan of care. We plan to proceed with the scheduled procedure.  Leotis Pain, MD  08/30/2019, 3:01 PM

## 2019-08-30 NOTE — Progress Notes (Signed)
PROGRESS NOTE    Dennis Walker  [id number] DOB: 1950-11-15 DOA: 08/28/2019  PCP: Juluis Pitch, MD    LOS - 2   Brief Narrative:  Dennis Walker a 69 y.o.malewith extensive past medical history includinghx of CAD s/p PCI to the proximal LAD, chronic HFpEF, stroke, PAD s/p right BKA, ESRD on HD Tues/Thurs/Sat,type 2 diabetes, childhood polio,blindnessand asthma who presented to the ED from [facility] with concerns of gangrenous left second toe.  The toe had become darker and malodorous over few days prior.  Patient is followed closely outpatient by vascular surgery for severe PVD.  In the ED, febrile T-max 101 F, and hypotensive.  Labs showed leukocytosis of 17.6k and stable anemia.  He was treated with broad-spectrum antibiotics and required admission to ICU for vasopressors.  Weaned off pressors by next morning and care assumed again by hospitalist service.    Subjective 2/26: Patient more awake, alert and interactive when seen and examined this morning.  Says he feels much better today.  Denies any pain, fevers, chills, chest pain shortness of breath, nausea vomiting or diarrhea.  He does report constipation however.  He is scheduled for left lower extremity angiogram today.  Assessment & Plan:   Principal Problem:   Gangrenous toe ([place]) Active Problems:   Severe sepsis (HCC)   Hypertension   ESRD (end stage renal disease) (HCC)   Chronic systolic heart failure (HCC)   Hypotension   Hx of BKA, right (HCC)   PVD (peripheral vascular disease) ([place])   Gangrene of left foot (HCC)   Sepsis ([place])   Advanced care planning/counseling discussion   Goals of care, counseling/discussion   Palliative care by specialist   Severe sepsis secondary to gangrenous toe - present on admission as evidenced by fever, hypotension requiring vasopressor (due to ESRD to prevent volume overload) and infected gangrenous toe.  Vitals have stabilized, off pressors.  Given ESRD, patient received  only 500 cc bolus in the ED.  Broad spectrum antibiotics started in ED.   --monitor vitals closely --vasopressor if needed to maintain MAP>65  Gangrenous toe -present on admission, left second toe.  Patient is status post right BKA with severe peripheral vascular disease.  Expect he may require left BKA as toe does not appear salvageable. --Vascular surgery following --Continue Vanco and Zosyn --ID consulted --For angiogram left lower extremity today --Consider ID consult, unless plan is for BKA  Hypertension - with hypotension on admission.  BP meds on hold. Of note - BP can only be checked on his LLE, given right BKA and bilateral upper extremity AV fistulas.  ESRD (end stage renal disease)  --nephrology following for dialysis  Chronic systolic heart failure - currently hypovolemic.   --beta blockers on hold due to hypotension, resume when BP tolerates  Hypotension - present on admission with sepsis. Resolved as above.  Monitor BP closely.  Hx of Right BKA - due to severe PVD.  Residual distal limb appears healthy.  PVD (peripheral vascular disease) - with prior amputation and now gangrenous left toe that may require left BKA as well.   Hold ASA and Plavix in case of surgery.  Anemia of chronic disease -secondary to ESRD.  No evidence of active blood loss. --Monitor CBC  DVT prophylaxis: heparin   Code Status: Full Code  Family Communication: daughter [name] updated at bedside late this afternoon  Disposition Plan:  Pending clinical course and clearance by vascular surgery.  Expect return to [facility]. Coming From SNF  Exp DC Date TBD Barriers as above Medically Stable for Discharge? No   Consultants:   Vascular Surgery  Nephrology  Infectious disease  Procedures:   None  Antimicrobials:   Vancomycin 2/25 >> TBD  Zosyn 2/25 >> TBD  Vanc/Cefepime/Flagyl - in ED 2/24    Objective: Vitals:   08/30/19 0300 08/30/19 0400 08/30/19 0500  08/30/19 0600  BP: (!) 98/54 (!) 104/56 105/61 (!) 102/54  Pulse: 92 81 84 80  Resp: (!) 25 (!) 21 (!) 21 (!) 24  Temp:      TempSrc:      SpO2: 100% 100% 100% 100%  Weight:      Height:        Intake/Output Summary (Last 24 hours) at 08/30/2019 0830 Last data filed at 08/30/2019 0604 Gross per 24 hour  Intake 324.51 ml  Output 500 ml  Net -175.49 ml   Filed Weights   08/28/19 1739 08/29/19 0528  Weight: 85 kg 80.8 kg    Examination:  General exam: awake, alert, no acute distress HEENT: moist mucus membranes, hearing grossly normal, left eye appears enucleated(?) Respiratory system: CTAB, no wheezes, rales or rhonchi, normal respiratory effort. Cardiovascular system: normal S1/S2, RRR, no pedal edema.   Gastrointestinal system: soft, NT, ND, no HSM felt, +bowel sounds. Central nervous system: A&O x3. no gross focal neurologic deficits, normal speech Extremities: Status post right BKA, left second toe gangrenous and malodorous, ischemic appearing skin changes of the distal left lower extremity Skin: dry, intact, normal temperature Psychiatry: normal mood, congruent affect   Data Reviewed: I have personally reviewed following labs and imaging studies  CBC: Recent Labs  Lab 08/28/19 1746 08/29/19 0518 08/30/19 0511  WBC 17.6* 21.8* 20.3*  NEUTROABS 15.1*  --  17.5*  HGB 9.8* 10.0* 9.9*  HCT 32.7* 32.2* 30.9*  MCV 89.6 88.0 85.1  PLT 228 241 [id number]   Basic Metabolic Panel: Recent Labs  Lab 08/28/19 1746 08/29/19 0518 08/30/19 0511  NA 135 137 140  K 3.6 3.9 3.4*  CL 96* 97* 99  CO2 24 23 25   GLUCOSE 136* 148* 115*  BUN 40* 45* 31*  CREATININE 8.08* 8.66* 6.42*  CALCIUM 8.2* 8.1* 8.3*  MG  --   --  2.1  PHOS  --   --  3.2   GFR: Estimated Creatinine Clearance: 11.9 mL/min (A) (by C-G formula based on SCr of 6.42 mg/dL (H)). Liver Function Tests: Recent Labs  Lab 08/28/19 1746  AST 34  ALT 18  ALKPHOS 168*  BILITOT 1.6*  PROT 6.7  ALBUMIN 2.7*    No results for input(s): LIPASE, AMYLASE in the last 168 hours. No results for input(s): AMMONIA in the last 168 hours. Coagulation Profile: No results for input(s): INR, PROTIME in the last 168 hours. Cardiac Enzymes: No results for input(s): CKTOTAL, CKMB, CKMBINDEX, TROPONINI in the last 168 hours. BNP (last 3 results) No results for input(s): PROBNP in the last 8760 hours. HbA1C: Recent Labs    08/29/19 0518  HGBA1C 7.8*   CBG: Recent Labs  Lab 08/29/19 1556 08/29/19 1916 08/29/19 2342 08/30/19 0333 08/30/19 0739  GLUCAP 96 97 109* 102* 101*   Lipid Profile: No results for input(s): CHOL, HDL, LDLCALC, TRIG, CHOLHDL, LDLDIRECT in the last 72 hours. Thyroid Function Tests: No results for input(s): TSH, T4TOTAL, FREET4, T3FREE, THYROIDAB in the last 72 hours. Anemia Panel: No results for input(s): VITAMINB12, FOLATE, FERRITIN, TIBC, IRON, RETICCTPCT in the last 72 hours. Sepsis Labs: Recent Labs  Lab 08/28/19 1746 08/28/19 2137  PROCALCITON 2.05  --   LATICACIDVEN 0.8 0.8    Recent Results (from the past 240 hour(s))  Blood Culture (routine x 2)     Status: None (Preliminary result)   Collection Time: 08/28/19  5:46 PM   Specimen: BLOOD  Result Value Ref Range Status   Specimen Description BLOOD RESIDENT  Final   Special Requests   Final    BOTTLES DRAWN AEROBIC AND ANAEROBIC Blood Culture results may not be optimal due to an inadequate volume of blood received in culture bottles   Culture  Setup Time   Final    GRAM POSITIVE COCCI AEROBIC BOTTLE ONLY CRITICAL RESULT CALLED TO, READ BACK BY AND VERIFIED WITH: JASON ROBBINS 08/29/19 @ 1803  [place] Performed at [facility], [address] [place], [place] [address]    Culture GRAM POSITIVE COCCI  Final   Report Status PENDING  Incomplete  Respiratory Panel by RT PCR (Flu A&B, Covid) - Nasopharyngeal Swab     Status: None   Collection Time: 08/28/19  7:32 PM   Specimen: Nasopharyngeal Swab  Result  Value Ref Range Status   SARS Coronavirus 2 by RT PCR NEGATIVE NEGATIVE Final    Comment: (NOTE) SARS-CoV-2 target nucleic acids are NOT DETECTED. The SARS-CoV-2 RNA is generally detectable in upper respiratoy specimens during the acute phase of infection. The lowest concentration of SARS-CoV-2 viral copies this assay can detect is 131 copies/mL. A negative result does not preclude SARS-Cov-2 infection and should not be used as the sole basis for treatment or other patient management decisions. A negative result may occur with  improper specimen collection/handling, submission of specimen other than nasopharyngeal swab, presence of viral mutation(s) within the areas targeted by this assay, and inadequate number of viral copies (<131 copies/mL). A negative result must be combined with clinical observations, patient history, and epidemiological information. The expected result is Negative. Fact Sheet for Patients:  PinkCheek.be Fact Sheet for Healthcare Providers:  GravelBags.it This test is not yet ap proved or cleared by the [place] FDA and  has been authorized for detection and/or diagnosis of SARS-CoV-2 by FDA under an Emergency Use Authorization (EUA). This EUA will remain  in effect (meaning this test can be used) for the duration of the COVID-19 declaration under Section 564(b)(1) of the Act, 21 U.S.C. section 360bbb-3(b)(1), unless the authorization is terminated or revoked sooner.    Influenza A by PCR NEGATIVE NEGATIVE Final   Influenza B by PCR NEGATIVE NEGATIVE Final    Comment: (NOTE) The Xpert Xpress SARS-CoV-2/FLU/RSV assay is intended as an aid in  the diagnosis of influenza from Nasopharyngeal swab specimens and  should not be used as a sole basis for treatment. Nasal washings and  aspirates are unacceptable for Xpert Xpress SARS-CoV-2/FLU/RSV  testing. Fact Sheet for  Patients: PinkCheek.be Fact Sheet for Healthcare Providers: GravelBags.it This test is not yet approved or cleared by the [place] FDA and  has been authorized for detection and/or diagnosis of SARS-CoV-2 by  FDA under an Emergency Use Authorization (EUA). This EUA will remain  in effect (meaning this test can be used) for the duration of the  Covid-19 declaration under Section 564(b)(1) of the Act, 21  U.S.C. section 360bbb-3(b)(1), unless the authorization is  terminated or revoked. Performed at [facility], [address] [place],  [address]   MRSA PCR Screening     Status: None   Collection Time: 08/29/19 12:16 AM  Specimen: Foot, Left; Nasopharyngeal  Result Value Ref Range Status   MRSA by PCR NEGATIVE NEGATIVE Final    Comment:        The GeneXpert MRSA Assay (FDA approved for NASAL specimens only), is one component of a comprehensive MRSA colonization surveillance program. It is not intended to diagnose MRSA infection nor to guide or monitor treatment for MRSA infections. Performed at [facility], [address] [place], [place] [address]   Blood Culture (routine x 2)     Status: None (Preliminary result)   Collection Time: 08/29/19  1:31 AM   Specimen: Leg; Blood  Result Value Ref Range Status   Specimen Description LEG  Final   Special Requests Blood Culture adequate volume  Final   Culture   Final    NO GROWTH 1 DAY Performed at [facility], [address] [place], [place] [address]    Report Status PENDING  Incomplete         Radiology Studies: DG Chest Port 1 View  Result Date: 08/28/2019 CLINICAL DATA:  Possible left foot infection with skin changes. EXAM: PORTABLE CHEST 1 VIEW COMPARISON:  Radiographs 02/16/2018 and 01/23/2018. FINDINGS: 1758 hours. Stable mild cardiomegaly and aortic atherosclerosis. There is a left subclavian vascular stent.  Mild interstitial prominence in both lungs appears unchanged. No evidence of superimposed edema, confluent airspace opacity, pneumothorax or significant pleural effusion. The bones appear unchanged. Telemetry leads overlie the chest. IMPRESSION: Stable chest with mild cardiomegaly and chronic interstitial prominence. No evidence of pneumonia. Electronically Signed   By: Richardean Sale M.D.   On: 08/28/2019 18:36   DG Foot Complete Left  Result Date: 08/28/2019 CLINICAL DATA:  Possible left foot infection. Progressive symptoms over the last 5 days with skin darkening of the toes and foul odor. EXAM: LEFT FOOT - COMPLETE 3+ VIEW COMPARISON:  None. FINDINGS: The bones appear adequately mineralized. There is no evidence of acute fracture or dislocation. The soft tissues of the 2nd toe appear atretic with possible exposure of the distal tuft. There are possible erosions of the distal tuft of the 2nd digit as well as the great toe. No other evidence of bone destruction. Extensive vascular calcifications are noted. There is mild dorsal forefoot soft tissue swelling without soft tissue emphysema or foreign body. IMPRESSION: Atretic soft tissues of the 2nd toe suspicious for infarction. Possible tuft erosions of the distal 1st and 2nd digits. These findings could reflect early osteomyelitis. Extensive vascular calcifications. Electronically Signed   By: Richardean Sale M.D.   On: 08/28/2019 18:40        Scheduled Meds: . atorvastatin  20 mg Oral q1800  . Chlorhexidine Gluconate Cloth  6 each Topical Daily  . cholecalciferol  1,000 Units Oral q1800  . ferrous sulfate  325 mg Oral q1800  . gabapentin  100 mg Oral QPM  . heparin injection (subcutaneous)  5,000 Units Subcutaneous Q8H  . insulin aspart  0-15 Units Subcutaneous Q4H  . mouth rinse  15 mL Mouth Rinse BID  . multivitamin with minerals  1 tablet Oral QHS  . polyvinyl alcohol  1 drop Left Eye QID  . sertraline  100 mg Oral QHS   Continuous  Infusions: . sodium chloride Stopped (08/29/19 0827)  . piperacillin-tazobactam (ZOSYN)  IV Stopped (08/30/19 0130)  . vancomycin Stopped (08/29/19 1447)     LOS: 2 days    Time spent: 35 minutes    Ezekiel Slocumb, DO [facility]   If 7PM-7AM,  please contact night-coverage [email or web address] 08/30/2019, 8:30 AM

## 2019-08-30 NOTE — Op Note (Signed)
[facility]  Percutaneous Study/Intervention Procedural Note   Date of Surgery: 08/30/2019  Surgeon(s):Keala Drum    Assistants:none  Pre-operative Diagnosis: PAD with gangrene left foot  Post-operative diagnosis:  Same  Procedure(s) Performed:             1.  Ultrasound guidance for vascular access right femoral artery             2.  Catheter placement into left SFA from right femoral approach             3.  Aortogram and selective left lower extremity angiogram             4.  StarClose closure device right femoral artery  EBL: 3 cc  Contrast: 35 cc  Fluoro Time: 1.2 minutes  Moderate Conscious Sedation Time: approximately 15 minutes using 1 mg of Versed and 25 mcg of Fentanyl              Indications:  Patient is a 69 y.o.male with a gangrenous foot.  The patient is brought in for angiography for further evaluation and potential treatment and help determine the level of amputation.  Due to the limb threatening nature of the situation, angiogram was performed for attempted limb salvage. The patient is aware that if the procedure fails, amputation would be expected.  The patient also understands that even with successful revascularization, amputation may still be required due to the severity of the situation. Risks and benefits are discussed and informed consent is obtained.   Procedure:  The patient was identified and appropriate procedural time out was performed.  The patient was then placed supine on the table and prepped and draped in the usual sterile fashion. Moderate conscious sedation was administered during a face to face encounter with the patient throughout the procedure with my supervision of the RN administering medicines and monitoring the patient's vital signs, pulse oximetry, telemetry and mental status throughout from the start of the procedure until the patient was taken to the recovery room. Ultrasound was used to evaluate the right common  femoral artery.  It was patent .  A digital ultrasound image was acquired.  A Seldinger needle was used to access the right common femoral artery under direct ultrasound guidance and a permanent image was performed.  A 0.035 J wire was advanced without resistance and a 5Fr sheath was placed.  Pigtail catheter was placed into the aorta and an AP aortogram was performed. This demonstrated that the renal arteries are somewhat sluggish but without obvious focal stenosis.  Aorta and iliac arteries are calcified but no stenosis is present. . I then crossed the aortic bifurcation and advanced to the left femoral head and then down into the proximal left SFA to help opacify distally. Selective left lower extremity angiogram was then performed. This demonstrated calcific but not stenotic common femoral artery, superficial femoral artery, and popliteal artery.  There is a typical tibial trifurcation.  The posterior tibial artery was the dominant runoff to the foot although it was small at the foot and ankle and heavily diseased.  The peroneal artery was small and appeared to occlude in the mid to distal segment.  The anterior tibial artery did have at least a moderate stenosis in the proximal to mid segment and was then continuous down to the dorsalis pedis artery where it again was diseased and small vessels.  He clearly had enough blood flow to heal a below-knee amputation it was felt that his foot  was not salvageable, so intervention on the tibial vessels would not be indicated at this point. I elected to terminate the procedure. The sheath was removed and StarClose closure device was deployed in the right femoral artery with excellent hemostatic result. The patient was taken to the recovery room in stable condition having tolerated the procedure well.  Findings:               Aortogram:  Renal arteries are somewhat sluggish but without obvious focal stenosis.  Aorta and iliac arteries are calcified but no stenosis is  present.             Left lower Extremity:  This demonstrated calcific but not stenotic common femoral artery, superficial femoral artery, and popliteal artery.  There is a typical tibial trifurcation.  The posterior tibial artery was the dominant runoff to the foot although it was small at the foot and ankle and heavily diseased.  The peroneal artery was small and appeared to occlude in the mid to distal segment.  The anterior tibial artery did have at least a moderate stenosis in the proximal to mid segment and was then continuous down to the dorsalis pedis artery where it again was diseased and small vessels.   Disposition: Patient was taken to the recovery room in stable condition having tolerated the procedure well.  Complications: None  Leotis Pain 08/30/2019 4:28 PM   This note was created with Dragon Medical transcription system. Any errors in dictation are purely unintentional.

## 2019-08-30 NOTE — Progress Notes (Signed)
[facility]  ROUNDING NOTE   Subjective:   Hemodialysis treatment yesterday. Tolerated treatment well. UF of 0.5 liters.   Palliative care meeting yesterday. Patient wants aggressive measures.   Vascular surgery scheduled for amputation today.   Objective:  Vital signs in last 24 hours:  Temp:  [99 F (37.2 C)-102.2 F (39 C)] 99.1 F (37.3 C) (02/26 0839) Pulse Rate:  [77-94] 86 (02/26 0839) Resp:  [12-25] 18 (02/26 0839) BP: (91-120)/(54-71) 103/58 (02/26 0800) SpO2:  [98 %-100 %] 100 % (02/26 0839)  Weight change:  Filed Weights   08/28/19 1739 08/29/19 0528  Weight: 85 kg 80.8 kg    Intake/Output: I/O last 3 completed shifts: In: 419.2 [I.V.:119.3; IV Piggyback:299.9] Out: 500 [Other:500]   Intake/Output this shift:  No intake/output data recorded.  Physical Exam: General: NAD, laying in bed  Head: Normocephalic, atraumatic. Moist oral mucosal membranes  Eyes: Anicteric, PERRL  Neck: Supple, trachea midline  Lungs:  Clear to auscultation  Heart: Regular rate and rhythm  Abdomen:  Soft, nontender,   Extremities: Right BKA, left with gangrenous changes  Neurologic: Left sided weakness  Skin: No lesions  Access: Left AVF, right AVF maturing.     Basic Metabolic Panel: Recent Labs  Lab 08/28/19 1746 08/29/19 0518 08/30/19 0511  NA 135 137 140  K 3.6 3.9 3.4*  CL 96* 97* 99  CO2 24 23 25   GLUCOSE 136* 148* 115*  BUN 40* 45* 31*  CREATININE 8.08* 8.66* 6.42*  CALCIUM 8.2* 8.1* 8.3*  MG  --   --  2.1  PHOS  --   --  3.2    Liver Function Tests: Recent Labs  Lab 08/28/19 1746  AST 34  ALT 18  ALKPHOS 168*  BILITOT 1.6*  PROT 6.7  ALBUMIN 2.7*   No results for input(s): LIPASE, AMYLASE in the last 168 hours. No results for input(s): AMMONIA in the last 168 hours.  CBC: Recent Labs  Lab 08/28/19 1746 08/29/19 0518 08/30/19 0511  WBC 17.6* 21.8* 20.3*  NEUTROABS 15.1*  --  17.5*  HGB 9.8* 10.0* 9.9*  HCT 32.7* 32.2*  30.9*  MCV 89.6 88.0 85.1  PLT 228 241 248    Cardiac Enzymes: No results for input(s): CKTOTAL, CKMB, CKMBINDEX, TROPONINI in the last 168 hours.  BNP: Invalid input(s): POCBNP  CBG: Recent Labs  Lab 08/29/19 1556 08/29/19 1916 08/29/19 2342 08/30/19 0333 08/30/19 0739  GLUCAP 96 97 109* 102* 101*    Microbiology: Results for orders placed or performed during the hospital encounter of 08/28/19  Blood Culture (routine x 2)     Status: None (Preliminary result)   Collection Time: 08/28/19  5:46 PM   Specimen: BLOOD  Result Value Ref Range Status   Specimen Description BLOOD RESIDENT  Final   Special Requests   Final    BOTTLES DRAWN AEROBIC AND ANAEROBIC Blood Culture results may not be optimal due to an inadequate volume of blood received in culture bottles   Culture  Setup Time   Final    GRAM POSITIVE COCCI AEROBIC BOTTLE ONLY CRITICAL RESULT CALLED TO, READ BACK BY AND VERIFIED WITH: JASON ROBBINS 08/29/19 @ 1803  [place] Performed at [facility], [address] [place], [place] [address]    Culture GRAM POSITIVE COCCI  Final   Report Status PENDING  Incomplete  Respiratory Panel by RT PCR (Flu A&B, Covid) - Nasopharyngeal Swab     Status: None   Collection Time: 08/28/19  7:32  PM   Specimen: Nasopharyngeal Swab  Result Value Ref Range Status   SARS Coronavirus 2 by RT PCR NEGATIVE NEGATIVE Final    Comment: (NOTE) SARS-CoV-2 target nucleic acids are NOT DETECTED. The SARS-CoV-2 RNA is generally detectable in upper respiratoy specimens during the acute phase of infection. The lowest concentration of SARS-CoV-2 viral copies this assay can detect is 131 copies/mL. A negative result does not preclude SARS-Cov-2 infection and should not be used as the sole basis for treatment or other patient management decisions. A negative result may occur with  improper specimen collection/handling, submission of specimen other than nasopharyngeal swab, presence of viral  mutation(s) within the areas targeted by this assay, and inadequate number of viral copies (<131 copies/mL). A negative result must be combined with clinical observations, patient history, and epidemiological information. The expected result is Negative. Fact Sheet for Patients:  PinkCheek.be Fact Sheet for Healthcare Providers:  GravelBags.it This test is not yet ap proved or cleared by the [place] FDA and  has been authorized for detection and/or diagnosis of SARS-CoV-2 by FDA under an Emergency Use Authorization (EUA). This EUA will remain  in effect (meaning this test can be used) for the duration of the COVID-19 declaration under Section 564(b)(1) of the Act, 21 U.S.C. section 360bbb-3(b)(1), unless the authorization is terminated or revoked sooner.    Influenza A by PCR NEGATIVE NEGATIVE Final   Influenza B by PCR NEGATIVE NEGATIVE Final    Comment: (NOTE) The Xpert Xpress SARS-CoV-2/FLU/RSV assay is intended as an aid in  the diagnosis of influenza from Nasopharyngeal swab specimens and  should not be used as a sole basis for treatment. Nasal washings and  aspirates are unacceptable for Xpert Xpress SARS-CoV-2/FLU/RSV  testing. Fact Sheet for Patients: PinkCheek.be Fact Sheet for Healthcare Providers: GravelBags.it This test is not yet approved or cleared by the [place] FDA and  has been authorized for detection and/or diagnosis of SARS-CoV-2 by  FDA under an Emergency Use Authorization (EUA). This EUA will remain  in effect (meaning this test can be used) for the duration of the  Covid-19 declaration under Section 564(b)(1) of the Act, 21  U.S.C. section 360bbb-3(b)(1), unless the authorization is  terminated or revoked. Performed at [facility], [address] [place], [place] [address]   MRSA PCR Screening     Status: None    Collection Time: 08/29/19 12:16 AM   Specimen: Foot, Left; Nasopharyngeal  Result Value Ref Range Status   MRSA by PCR NEGATIVE NEGATIVE Final    Comment:        The GeneXpert MRSA Assay (FDA approved for NASAL specimens only), is one component of a comprehensive MRSA colonization surveillance program. It is not intended to diagnose MRSA infection nor to guide or monitor treatment for MRSA infections. Performed at [facility], [address] [place], [place] [address]   Blood Culture (routine x 2)     Status: None (Preliminary result)   Collection Time: 08/29/19  1:31 AM   Specimen: Leg; Blood  Result Value Ref Range Status   Specimen Description LEG  Final   Special Requests Blood Culture adequate volume  Final   Culture   Final    NO GROWTH 1 DAY Performed at [facility], [address] [place], [place] [address]    Report Status PENDING  Incomplete    Coagulation Studies: No results for input(s): LABPROT, INR in the last 72 hours.  Urinalysis: No results for input(s): COLORURINE,  LABSPEC, PHURINE, GLUCOSEU, HGBUR, BILIRUBINUR, KETONESUR, PROTEINUR, UROBILINOGEN, NITRITE, LEUKOCYTESUR in the last 72 hours.  Invalid input(s): APPERANCEUR    Imaging: DG Chest Port 1 View  Result Date: 08/28/2019 CLINICAL DATA:  Possible left foot infection with skin changes. EXAM: PORTABLE CHEST 1 VIEW COMPARISON:  Radiographs 02/16/2018 and 01/23/2018. FINDINGS: 1758 hours. Stable mild cardiomegaly and aortic atherosclerosis. There is a left subclavian vascular stent. Mild interstitial prominence in both lungs appears unchanged. No evidence of superimposed edema, confluent airspace opacity, pneumothorax or significant pleural effusion. The bones appear unchanged. Telemetry leads overlie the chest. IMPRESSION: Stable chest with mild cardiomegaly and chronic interstitial prominence. No evidence of pneumonia. Electronically Signed   By: Richardean Sale M.D.   On:  08/28/2019 18:36   DG Foot Complete Left  Result Date: 08/28/2019 CLINICAL DATA:  Possible left foot infection. Progressive symptoms over the last 5 days with skin darkening of the toes and foul odor. EXAM: LEFT FOOT - COMPLETE 3+ VIEW COMPARISON:  None. FINDINGS: The bones appear adequately mineralized. There is no evidence of acute fracture or dislocation. The soft tissues of the 2nd toe appear atretic with possible exposure of the distal tuft. There are possible erosions of the distal tuft of the 2nd digit as well as the great toe. No other evidence of bone destruction. Extensive vascular calcifications are noted. There is mild dorsal forefoot soft tissue swelling without soft tissue emphysema or foreign body. IMPRESSION: Atretic soft tissues of the 2nd toe suspicious for infarction. Possible tuft erosions of the distal 1st and 2nd digits. These findings could reflect early osteomyelitis. Extensive vascular calcifications. Electronically Signed   By: Richardean Sale M.D.   On: 08/28/2019 18:40     Medications:   . sodium chloride Stopped (08/29/19 0827)  . piperacillin-tazobactam (ZOSYN)  IV Stopped (08/30/19 0130)  . vancomycin Stopped (08/29/19 1447)   . atorvastatin  20 mg Oral q1800  . Chlorhexidine Gluconate Cloth  6 each Topical Daily  . cholecalciferol  1,000 Units Oral q1800  . ferrous sulfate  325 mg Oral q1800  . gabapentin  100 mg Oral QPM  . heparin injection (subcutaneous)  5,000 Units Subcutaneous Q8H  . insulin aspart  0-15 Units Subcutaneous Q4H  . mouth rinse  15 mL Mouth Rinse BID  . multivitamin with minerals  1 tablet Oral QHS  . polyvinyl alcohol  1 drop Left Eye QID  . sertraline  100 mg Oral QHS   acetaminophen, albuterol, oxyCODONE-acetaminophen, polyethylene glycol  Assessment/ Plan:  Dennis Walker is a 69 y.o. black male with end stage renal disease on hemodialysis, hypertension, childhood polio, hyperlipidemia, CVA with left sided residual weakness,  peripheral vascular disease, coronary artery disease, diabetes mellitus type II, diabetic neuropathy, congestive heart failure, asthma.   [facility] TTS [facility] 85kg Left AVF  1. End stage renal disease: TTS schedule. Dialysis for tomorrow.   2. Anemia of chronic kidney disease: hemoglobin 9.9 - EPO with HD treatment  3. Secondary Hyperparathyroidism: with hyperphosphatemia, and hypocalcemia.  Outpatient labs 08/12/19 PTH elevated at 662. Phosphorus 10.1, calcium 7.1.  Does not seem he is on any binders.  - Discussed starting binders with patient.   4. Hypotension: with septic shock. Required norepinephrine on admission. Holding home regimen of carvedilol.   5. Peripheral vascular disease: with gangrenous changes  - appreciate vascular input. Amputation scheduled for 2/26   LOS: 2 Jekhi Bolin 2/26/20219:00 AM

## 2019-08-30 NOTE — Progress Notes (Signed)
Daily Progress Note   Patient Name: Dennis Walker       Date: 08/30/2019 DOB: 1951-01-19  Age: 69 y.o. MRN#: [id number] Attending Physician: Ezekiel Slocumb, DO Primary Care Physician: Juluis Pitch, MD Admit Date: 08/28/2019  Reason for Consultation/Follow-up: Establishing goals of care  Subjective: Patient wakes to verbal stimulation but not interested in conversation. Daughter at bedside.   Length of Stay: 2  Current Medications: Scheduled Meds:  . diphenhydrAMINE      . famotidine      . methylPREDNISolone sodium succinate      . atorvastatin  20 mg Oral q1800  . Chlorhexidine Gluconate Cloth  6 each Topical Daily  . cholecalciferol  1,000 Units Oral q1800  . ferrous sulfate  325 mg Oral q1800  . gabapentin  100 mg Oral QPM  . heparin injection (subcutaneous)  5,000 Units Subcutaneous Q8H  . insulin aspart  0-15 Units Subcutaneous Q4H  . mouth rinse  15 mL Mouth Rinse BID  . multivitamin with minerals  1 tablet Oral QHS  . polyvinyl alcohol  1 drop Left Eye QID  . sertraline  100 mg Oral QHS    Continuous Infusions: . clindamycin    . sodium chloride Stopped (08/29/19 0827)  . sodium chloride    . [START ON 08/31/2019] clindamycin (CLEOCIN) IV    . piperacillin-tazobactam (ZOSYN)  IV 3.375 g (08/30/19 1420)  . vancomycin Stopped (08/29/19 1447)    PRN Meds: acetaminophen, albuterol, diphenhydrAMINE, famotidine, HYDROmorphone (DILAUDID) injection, methylPREDNISolone (SOLU-MEDROL) injection, midazolam, ondansetron (ZOFRAN) IV, oxyCODONE-acetaminophen, polyethylene glycol  Physical Exam Constitutional:      General: He is not in acute distress.    Comments: Wakes to voice  Pulmonary:     Effort: Pulmonary effort is normal. No respiratory distress.  Abdominal:     Palpations:  Abdomen is soft.  Musculoskeletal:     Comments: R BKA, left gangrenous foot  Skin:    General: Skin is warm and dry.  Neurological:     Mental Status: He is disoriented.             Vital Signs: BP 106/69   Pulse 77   Temp 98.1 F (36.7 C) (Axillary)   Resp 16   Ht '5\' 11"'$  (1.803 m)   Wt 80.8 kg   SpO2 100%   BMI 24.84 kg/m  SpO2: SpO2: 100 % O2 Device: O2 Device: Room Air O2 Flow Rate: O2 Flow Rate (L/min): 2 L/min  Intake/output summary:   Intake/Output Summary (Last 24 hours) at 08/30/2019 1430 Last data filed at 08/30/2019 0604 Gross per 24 hour  Intake 324.51 ml  Output --  Net 324.51 ml   LBM: Last BM Date: 08/28/19 Baseline Weight: Weight: 85 kg Most recent weight: Weight: (unable to obtain)       Palliative Assessment/Data: PPS 30%      Patient Active Problem List   Diagnosis Date Noted  . Gangrene of left foot ([place])   . Sepsis ([place])   . Advanced care planning/counseling discussion   . Goals of care, counseling/discussion   . Palliative care by specialist   . Severe sepsis ([place]) 08/29/2019  . Gangrenous toe ([place]) 08/28/2019  . Hypotension  08/28/2019  . PVD (peripheral vascular disease) ([place]) 08/28/2019  . Hx of BKA, right ([place]) 08/28/2019  . Anemia in CKD (chronic kidney disease) 06/26/2019  . 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 Dialysis AV fistula malfunction ([place]) 06/26/2019  . Nephrotic range proteinuria 06/26/2019  . Encounter for orthopedic follow-up care 05/20/2019  . Atherosclerosis of native arteries of the extremities with ulceration ([place]) 11/12/2018  . Coronary artery disease involving native coronary artery of native heart without angina pectoris 08/18/2018  . Hyperlipidemia LDL goal <70 08/18/2018  . Preprocedural cardiovascular examination 08/18/2018  . MRSA bacteremia   . Infection of AV graft for dialysis ([place])   . Bacteremia 02/16/2018  . 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 Chest pain, mid sternal 01/03/2018  . Acute on chronic respiratory  failure with hypoxia ([place]) 12/24/2017  . Pain in right hand 08/25/2017  . Elevated troponin 08/05/2017  . 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 Complication from renal dialysis device 02/20/2017  . ESRD (end stage renal disease) ([place]) 02/12/2017  . Altered mental status 02/02/2017  . Atherosclerosis of native arteries of extremity with intermittent claudication ([place]) 12/20/2016  . Ulcer of amputation stump of lower extremity ([place]) 12/20/2016  . Diabetes ([place]) 12/20/2016  . Hypertension 12/20/2016  . Pure hypercholesterolemia 12/20/2016  . Bleeding 07/29/2016  . Panic attack 01/21/2016  . Chronic systolic heart failure ([place]) 10/27/2015  . Type 2 diabetes mellitus with diabetic nephropathy, with long-term current use of insulin ([place]) 04/06/2015  . Overweight (BMI 25.0-29.9) 12/04/2014  . Vitamin D deficiency 03/17/2014  . TIA (transient ischemic attack) 01/04/2014  . Vision abnormalities 01/03/2014  . Chronic hepatitis C virus infection ([place]) 11/01/2013  . Arteriovenous fistula ([place]) 08/27/2013  . CHF exacerbation ([employer]) 12/12/2012  . Constipation 02/01/2012  . Ischemic cardiomyopathy 11/17/2010  . Retinopathy, diabetic, background ([place]) 11/11/2008  . Carotid artery stenosis 11/03/2008  . Cerebral infarct ([place]) 10/31/2008  . Cerebellar infarction ([place]) 07/04/2005  . Amputation of leg ([place]) 02/02/2004  . Poliomyelitis 07/04/1954    Palliative Care Assessment & Plan   HPI: 69 y.o. male  with past medical history of CVA, DM2, limb ischmia, ESRD on HD, severe PAD and PVD, debilitated and living at [facility]- admitted on 08/28/2019 with ongoing weakness, altered mental status, hypotension. Workup reveals sepsis r/t gangrenous lower extremity- Palliative medicine consulted for [place].  Assessment: Report received from Mariana Kaufman, NP who met with daughter yesterday for goals of care. She requested that I check in with daughter to ensure goals remain the same and no follow up  questions.   I first met with patient - his orientation/level of understanding is unclear - when I ask him about surgery he tells me he is ready for it.  I spoke with daughter later in the day - she tells me she is comfortable with decision for full code and full scope care including surgical intervention. All questions addressed. Provided her with me contact information for any further questions. She does have a copy of "Hard Choices" booklet and is reviewing it.  Recommendations/Plan:  Full code/full scope  PMT will shadow chart   Code Status:  Full code  Prognosis:   Unable to determine  Discharge Planning:  To Be Determined  Care plan was discussed with RN and daughter  Thank you for allowing the Palliative Medicine Team to assist in the care of this patient.   Total Time 15 minutes Prolonged Time Billed  no       Greater than 50%  of this time was spent counseling and coordinating care related to the above assessment and plan.  Juel Burrow, DNP, [facility] Palliative Medicine Team Team Phone # [phone]  Pager [phone]

## 2019-08-31 ENCOUNTER — Encounter
Admission: EM | Disposition: A | Discharge status: Skilled Nursing Facility | Payer: Self-pay | Source: Home / Self Care | Attending: Internal Medicine

## 2019-08-31 ENCOUNTER — Inpatient Hospital Stay: Payer: Medicare Other | Admitting: Anesthesiology

## 2019-08-31 ENCOUNTER — Encounter: Payer: Self-pay | Admitting: Family Medicine

## 2019-08-31 HISTORY — PX: AMPUTATION: SHX166

## 2019-08-31 LAB — CBC WITH DIFFERENTIAL/PLATELET
Abs Immature Granulocytes: 0.2 10*3/uL — ABNORMAL HIGH (ref 0.00–0.07)
Basophils Absolute: 0 10*3/uL (ref 0.0–0.1)
Basophils Relative: 0 %
Eosinophils Absolute: 0 10*3/uL (ref 0.0–0.5)
Eosinophils Relative: 0 %
HCT: 34.5 % — ABNORMAL LOW (ref 39.0–52.0)
Hemoglobin: 10.9 g/dL — ABNORMAL LOW (ref 13.0–17.0)
Immature Granulocytes: 1 %
Lymphocytes Relative: 3 %
Lymphs Abs: 0.7 10*3/uL (ref 0.7–4.0)
MCH: 27.1 pg (ref 26.0–34.0)
MCHC: 31.6 g/dL (ref 30.0–36.0)
MCV: 85.8 fL (ref 80.0–100.0)
Monocytes Absolute: 0.4 10*3/uL (ref 0.1–1.0)
Monocytes Relative: 2 %
Neutro Abs: 20.8 10*3/uL — ABNORMAL HIGH (ref 1.7–7.7)
Neutrophils Relative %: 94 %
Platelets: 292 10*3/uL (ref 150–400)
RBC: 4.02 MIL/uL — ABNORMAL LOW (ref 4.22–5.81)
RDW: 16.9 % — ABNORMAL HIGH (ref 11.5–15.5)
WBC: 22.1 10*3/uL — ABNORMAL HIGH (ref 4.0–10.5)
nRBC: 0.1 % (ref 0.0–0.2)

## 2019-08-31 LAB — CBC
HCT: 34.3 % — ABNORMAL LOW (ref 39.0–52.0)
Hemoglobin: 11 g/dL — ABNORMAL LOW (ref 13.0–17.0)
MCH: 27.1 pg (ref 26.0–34.0)
MCHC: 32.1 g/dL (ref 30.0–36.0)
MCV: 84.5 fL (ref 80.0–100.0)
Platelets: 292 10*3/uL (ref 150–400)
RBC: 4.06 MIL/uL — ABNORMAL LOW (ref 4.22–5.81)
RDW: 17.2 % — ABNORMAL HIGH (ref 11.5–15.5)
WBC: 22.3 10*3/uL — ABNORMAL HIGH (ref 4.0–10.5)
nRBC: 0 % (ref 0.0–0.2)

## 2019-08-31 LAB — BASIC METABOLIC PANEL
Anion gap: 14 (ref 5–15)
BUN: 46 mg/dL — ABNORMAL HIGH (ref 8–23)
CO2: 27 mmol/L (ref 22–32)
Calcium: 8.1 mg/dL — ABNORMAL LOW (ref 8.9–10.3)
Chloride: 98 mmol/L (ref 98–111)
Creatinine, Ser: 7.86 mg/dL — ABNORMAL HIGH (ref 0.61–1.24)
GFR calc Af Amer: 7 mL/min — ABNORMAL LOW (ref >60)
GFR calc non Af Amer: 6 mL/min — ABNORMAL LOW (ref >60)
Glucose, Bld: 190 mg/dL — ABNORMAL HIGH (ref 70–99)
Potassium: 4 mmol/L (ref 3.5–5.1)
Sodium: 139 mmol/L (ref 135–145)

## 2019-08-31 LAB — GLUCOSE, CAPILLARY
Glucose-Capillary: 145 mg/dL — ABNORMAL HIGH (ref 70–99)
Glucose-Capillary: 163 mg/dL — ABNORMAL HIGH (ref 70–99)
Glucose-Capillary: 137 mg/dL — ABNORMAL HIGH (ref 70–99)
Glucose-Capillary: 156 mg/dL — ABNORMAL HIGH (ref 70–99)
Glucose-Capillary: 181 mg/dL — ABNORMAL HIGH (ref 70–99)

## 2019-08-31 SURGERY — AMPUTATION BELOW KNEE
Anesthesia: General | Site: Knee | Laterality: Left

## 2019-08-31 MED ORDER — FENTANYL CITRATE (PF) 100 MCG/2ML IJ SOLN
INTRAMUSCULAR | Status: AC
  Filled 2019-08-31: qty 2

## 2019-08-31 MED ORDER — KETAMINE HCL 10 MG/ML IJ SOLN
INTRAMUSCULAR | Status: DC | PRN
  Administered 2019-08-31: 30 mg via INTRAVENOUS

## 2019-08-31 MED ORDER — LIDOCAINE HCL (CARDIAC) PF 100 MG/5ML IV SOSY
PREFILLED_SYRINGE | INTRAVENOUS | Status: DC | PRN
  Administered 2019-08-31: 80 mg via INTRAVENOUS

## 2019-08-31 MED ORDER — ONDANSETRON HCL 4 MG/2ML IJ SOLN
INTRAMUSCULAR | Status: DC | PRN
  Administered 2019-08-31: 4 mg via INTRAVENOUS

## 2019-08-31 MED ORDER — PROPOFOL 10 MG/ML IV BOLUS
INTRAVENOUS | Status: AC
  Filled 2019-08-31: qty 20

## 2019-08-31 MED ORDER — SODIUM CHLORIDE (PF) 0.9 % IJ SOLN
INTRAMUSCULAR | Status: AC
  Filled 2019-08-31: qty 10

## 2019-08-31 MED ORDER — VASOPRESSIN 20 UNIT/ML IV SOLN
INTRAVENOUS | Status: DC | PRN
  Administered 2019-08-31 (×2): 2 [IU] via INTRAVENOUS
  Administered 2019-08-31: 1 [IU] via INTRAVENOUS
  Administered 2019-08-31 (×2): 2 [IU] via INTRAVENOUS

## 2019-08-31 MED ORDER — ROCURONIUM BROMIDE 100 MG/10ML IV SOLN
INTRAVENOUS | Status: DC | PRN
  Administered 2019-08-31: 40 mg via INTRAVENOUS

## 2019-08-31 MED ORDER — PHENYLEPHRINE HCL (PRESSORS) 10 MG/ML IV SOLN
INTRAVENOUS | Status: DC | PRN
  Administered 2019-08-31: 100 ug via INTRAVENOUS

## 2019-08-31 MED ORDER — LIDOCAINE HCL (PF) 2 % IJ SOLN
INTRAMUSCULAR | Status: AC
  Filled 2019-08-31: qty 5

## 2019-08-31 MED ORDER — SUGAMMADEX SODIUM 200 MG/2ML IV SOLN
INTRAVENOUS | Status: DC | PRN
  Administered 2019-08-31: 150 mg via INTRAVENOUS

## 2019-08-31 MED ORDER — ETOMIDATE 2 MG/ML IV SOLN
INTRAVENOUS | Status: DC | PRN
  Administered 2019-08-31: 16 mg via INTRAVENOUS

## 2019-08-31 MED ORDER — ONDANSETRON HCL 4 MG/2ML IJ SOLN
INTRAMUSCULAR | Status: AC
  Filled 2019-08-31: qty 2

## 2019-08-31 MED ORDER — LIDOCAINE HCL (PF) 1 % IJ SOLN
INTRAMUSCULAR | Status: AC
  Filled 2019-08-31: qty 30

## 2019-08-31 MED ORDER — SODIUM CHLORIDE (PF) 0.9 % IJ SOLN
INTRAMUSCULAR | Status: AC
  Filled 2019-08-31: qty 20

## 2019-08-31 MED ORDER — MIDAZOLAM HCL 2 MG/2ML IJ SOLN
INTRAMUSCULAR | Status: AC
  Filled 2019-08-31: qty 2

## 2019-08-31 MED ORDER — LIDOCAINE HCL 1 % IJ SOLN
INTRAMUSCULAR | Status: DC | PRN
  Administered 2019-08-31: 20 mL

## 2019-08-31 MED ORDER — SUGAMMADEX SODIUM 200 MG/2ML IV SOLN
INTRAVENOUS | Status: AC
  Filled 2019-08-31: qty 2

## 2019-08-31 MED ORDER — KETAMINE HCL 50 MG/ML IJ SOLN
INTRAMUSCULAR | Status: AC
  Filled 2019-08-31: qty 10

## 2019-08-31 SURGICAL SUPPLY — 44 items
BAG ISOLATATION DRAPE 20X20 ST (DRAPES) ×1 IMPLANT
BLADE SAGITTAL WIDE XTHICK NO (BLADE) IMPLANT
BLADE SAW SAG 25.4X90 (BLADE) ×3 IMPLANT
BNDG COHESIVE 4X5 TAN STRL (GAUZE/BANDAGES/DRESSINGS) ×3 IMPLANT
BNDG ELASTIC 4X5.8 VLCR STR LF (GAUZE/BANDAGES/DRESSINGS) ×3 IMPLANT
BNDG ELASTIC 6X5.8 VLCR NS LF (GAUZE/BANDAGES/DRESSINGS) IMPLANT
BNDG GAUZE 4.5X4.1 6PLY STRL (MISCELLANEOUS) ×6 IMPLANT
BRUSH SCRUB EZ  4% CHG (MISCELLANEOUS) ×2
BRUSH SCRUB EZ 4% CHG (MISCELLANEOUS) ×1 IMPLANT
CANISTER SUCT 1200ML W/VALVE (MISCELLANEOUS) ×3 IMPLANT
CHLORAPREP W/TINT 26ML (MISCELLANEOUS) ×3 IMPLANT
COVER WAND RF STERILE (DRAPES) ×3 IMPLANT
DRAIN PENROSE 1/4X12 LTX STRL (WOUND CARE) IMPLANT
DRAPE INCISE IOBAN 66X45 STRL (DRAPES) ×3 IMPLANT
DRAPE ISOLATE BAG 20X20 STRL (DRAPES) ×2
ELECT CAUTERY BLADE 6.4 (BLADE) ×3 IMPLANT
ELECT REM PT RETURN 9FT ADLT (ELECTROSURGICAL) ×3
ELECTRODE REM PT RTRN 9FT ADLT (ELECTROSURGICAL) ×1 IMPLANT
GAUZE XEROFORM 1X8 LF (GAUZE/BANDAGES/DRESSINGS) ×9 IMPLANT
GLOVE BIO SURGEON STRL SZ7 (GLOVE) ×6 IMPLANT
GLOVE INDICATOR 7.5 STRL GRN (GLOVE) ×3 IMPLANT
GOWN STRL REUS W/ TWL LRG LVL3 (GOWN DISPOSABLE) ×2 IMPLANT
GOWN STRL REUS W/ TWL XL LVL3 (GOWN DISPOSABLE) ×1 IMPLANT
GOWN STRL REUS W/TWL LRG LVL3 (GOWN DISPOSABLE) ×4
GOWN STRL REUS W/TWL XL LVL3 (GOWN DISPOSABLE) ×2
HANDLE YANKAUER SUCT BULB TIP (MISCELLANEOUS) ×3 IMPLANT
KIT TURNOVER KIT A (KITS) ×3 IMPLANT
LABEL OR SOLS (LABEL) ×3 IMPLANT
NS IRRIG 1000ML POUR BTL (IV SOLUTION) ×3 IMPLANT
PACK EXTREMITY ARMC (MISCELLANEOUS) ×3 IMPLANT
PAD ABD DERMACEA PRESS 5X9 (GAUZE/BANDAGES/DRESSINGS) ×6 IMPLANT
PAD PREP 24X41 OB/GYN DISP (PERSONAL CARE ITEMS) ×3 IMPLANT
SPONGE LAP 18X18 RF (DISPOSABLE) ×3 IMPLANT
STAPLER SKIN PROX 35W (STAPLE) ×3 IMPLANT
STOCKINETTE M/LG 89821 (MISCELLANEOUS) ×3 IMPLANT
SUT SILK 2 0 (SUTURE) ×2
SUT SILK 2 0 SH (SUTURE) ×12 IMPLANT
SUT SILK 2-0 18XBRD TIE 12 (SUTURE) ×1 IMPLANT
SUT SILK 3 0 (SUTURE)
SUT SILK 3-0 18XBRD TIE 12 (SUTURE) IMPLANT
SUT VIC AB 0 CT1 36 (SUTURE) ×3 IMPLANT
SUT VIC AB 2-0 CT1 (SUTURE) ×3 IMPLANT
SUT VIC AB 3-0 SH 27 (SUTURE) ×4
SUT VIC AB 3-0 SH 27X BRD (SUTURE) ×2 IMPLANT

## 2019-08-31 NOTE — Progress Notes (Signed)
15 minute call to floor, ICU.

## 2019-08-31 NOTE — Progress Notes (Signed)
Pharmacy Antibiotic Note  Dennis Walker is a 69 y.o. male admitted on 08/28/2019 with sepsis secondary to left toe infection/gangrene. 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 Patient with past medical history significant for CAD, CHF, Stroke, Right BKA, Diabetes, ESRD requiring dialysis Tuesday/Thursday/Saturday. Coagulase negative staph growing in 1/4 blood cultures. Patient having angiogram today, 2/26, prior to proceeding to amputation.   Pharmacy has been consulted for Vancomycin dosing.  Plan: Continue vancomycin 1000 mg IV to be given with dialysis. Next scheduled dialysis today. Will plan to obtain level prior to dialysis session on 3/3.   Zosyn EI 3.375g IV Q12hr.   ID Consult. Continue vanc/Zosyn for now.   Height: 5\' 11"  (180.3 cm) Weight: (unable to obtain) IBW/kg (Calculated) : 75.3  Temp (24hrs), Avg:98 F (36.7 C), Min:97.6 F (36.4 C), Max:98.8 F (37.1 C)  Recent Labs  Lab 08/28/19 1746 08/28/19 2137 08/29/19 0518 08/30/19 0511 08/31/19 0437 08/31/19 0830  WBC 17.6*  --  21.8* 20.3* 22.1* 22.3*  CREATININE 8.08*  --  8.66* 6.42* 7.86*  --   LATICACIDVEN 0.8 0.8  --   --   --   --   VANCORANDOM  --   --  28  --   --   --     Estimated Creatinine Clearance: 9.7 mL/min (A) (by C-G formula based on SCr of 7.86 mg/dL (H)).    Allergies  Allergen Reactions  . Cefuroxime Itching  . Shrimp [Shellfish Allergy] Other (See Comments)    Per mar. Unknown reaction   . Sulfa Antibiotics Itching and Rash    Antimicrobials this admission: Cefepime 2/24 x 1.  Metronidazole 2/24 x 1 Vancomycin 2/24 >>  Zosyn 2/25 >>  Dose adjustments this admission: N/A  Microbiology results: 2/24 BCx: coag negative staph 1/4 2/26 BCx: no growth 2/25 MRSA PCR: negative  2/25 SARS Coronavirus: negative  2/25 Influenza A/B: negative   Thank you for allowing pharmacy to be a part of this patient's care.  Tawnya Crook, PharmD 08/31/2019 9:56 AM

## 2019-08-31 NOTE — Op Note (Signed)
  [facility]   OPERATIVE NOTE   PROCEDURE:  Left above-the-knee amputation  PRE-OPERATIVE DIAGNOSIS: Left foot gangrene  POST-OPERATIVE DIAGNOSIS: same as above  SURGEON:  Jamesetta So, MD  ASSISTANT(S): None  ANESTHESIA: general  ESTIMATED BLOOD LOSS: 250 cc  FINDING(S): Moderate bleeding from tissue bed  SPECIMEN(S):  Left above-the-knee amputation  INDICATIONS:   Dennis Walker is a 69 y.o. male who presents with left foot gangrene.  The patient is scheduled for a left above-the-knee amputation.  I discussed in depth with the patient the risks, benefits, and alternatives to this procedure.  The patient is aware that the risk of this operation included but are not limited to:  bleeding, infection, myocardial infarction, stroke, death, failure to heal amputation wound, and possible need for more proximal amputation.  The patient is aware of the risks and agrees proceed forward with the procedure.  DESCRIPTION: After full informed written consent was obtained from the patient, the patient was taken to the operating room, and placed supine upon the operating table.  Prior to induction, the patient received IV antibiotics.  The patient was then prepped and draped in the standard fashion for a left above-the-knee amputation.  After obtaining adequate anesthesia, the patient was prepped and draped in the standard fashion for a above-the-knee amputation.  I marked out the anterior and posterior flaps for a fish-mouth type of amputation.  I made the incisions for these flaps, and then dissected through the subcutaneous tissue, fascia, and muscles circumferentially.  I elevated  the periosteal tissue 4-5 cm more proximal than the anterior skin flap.  I then transected the femur with a power saw at this level.  Then I smoothed out the rough edges of the bone.  At this point, the specimen was passed off the field as the above-the-knee amputation.  At this point, I clamped  all visibly bleeding arteries and veins using a combination of suture ligation with silk suture and electrocautery.   Bleeding continued to be controlled with electrocautery and suture ligature.  The stump was washed off with sterile normal saline and no further active bleeding was noted.  I reapproximated the anterior and posterior fascia  with interrupted stitches of 0 Vicryl.  This was completed along the entire length of anterior and posterior fascia until there were no more loose space in the fascial line. The subcutaneous tissue was then approximated with 2-0 vicryl sutures. The skin was then  reapproximated with staples.  The stump was washed off and dried.  The incision was dressed with Xeroform and ABD pads, and  then fluffs were applied.  Kerlix was wrapped around the leg and then gently an ACE wrap was applied.  A large Ioban was then placed over the ACE wrap to secure the dressing. The patient was then awakened and take to the recovery room in stable condition.   COMPLICATIONS: none  CONDITION: stable  Dennis Walker A  08/31/2019, 2:55 PM   This note was created with Dragon Medical transcription system. Any errors in dictation are purely unintentional.

## 2019-08-31 NOTE — Progress Notes (Signed)
PROGRESS NOTE    ELBER GOSCINSKI  [id number] DOB: 01-26-1951 DOA: 08/28/2019  PCP: Juluis Pitch, MD    LOS - 3   Brief Narrative:  [place] Desanctis Dixonis a 69 y.o.malewithextensive pastmedical historyincludinghx of CAD s/p PCI to the proximal LAD, chronic HFpEF, stroke, PAD s/p right BKA, ESRD on HD Tues/Thurs/Sat,type 2 diabetes, childhood polio,blindnessand asthma who [facility]. The toe had become darker and malodorous over few days prior. Patient is followed closely outpatient by vascular surgery for severe PVD. In the ED, febrile T-max 101F,and hypotensive. Labs showed leukocytosis of 17.6k and stable anemia. He was treated with broad-spectrum antibiotics and required admission to ICU for vasopressors. Weaned off pressors by next morning and care assumed again by hospitalist service.   Subjective 2/27: Patient seen this morning at bedside.  No acute events reported overnight.  Patient denies any pain, fevers, chills or other acute complaints at this time.  Says he has not had breakfast, apparently is n.p.o. for possible today.    Assessment & Plan:   Principal Problem:   Gangrenous toe ([place]) Active Problems:   Severe sepsis (HCC)   Hypertension   ESRD (end stage renal disease) (HCC)   Chronic systolic heart failure (HCC)   Hypotension   Hx of BKA, right (HCC)   PVD (peripheral vascular disease) ([place])   Gangrene of left foot (HCC)   Sepsis ([place])   Advanced care planning/counseling discussion   Goals of care, counseling/discussion   Palliative care by specialist   Severe sepsissecondary to gangrenous toe-present on admission as evidenced by fever, hypotension requiring vasopressor(due to ESRD to prevent volume overload)and infected gangrenous toe. Vitals have stabilized, off pressors. Given ESRD, patient received only 500 cc bolus in the ED. Broad spectrum antibiotics started in ED.   --monitor vitals closely --vasopressor if needed to maintain MAP>65  Gangrenous toe-present on admission, left second toe. Patient is status post right BKA with severe peripheral vascular disease. Expect he may require left BKA as toe does not appear salvageable. --Vascular surgeryfollowing --Continue Vanco and Zosyn --ID consulted --For amputation today  Coagulase-negative Staph Positive Blood culture - on initial cultures, likely contaminate --repeat blood cultures pending, follow up  Hypertension- with hypotension on admission. BP meds on hold. Of note - BPcan only be checked on his LLE,given right BKA and bilateral upper extremity AV fistulas.  ESRD (end stage renal disease) --nephrology following for dialysis  Chronic systolic heart failure- currently hypovolemic.  --beta blockers on hold due to hypotension, resume when BP tolerates  Hypotension- present on admission with sepsis. Resolved as above. Monitor BP closely.  Hx ofRightBKA- due to severe PVD. Residual distal limb appears healthy.  PVD (peripheral vascular disease)- with prior amputation and now gangrenous left toe that may require left BKA as well. Hold ASA and Plavix in case of surgery.  Anemia of chronic disease -secondary to ESRD.  No evidence of active blood loss. --Monitor CBC  DVT prophylaxis:heparin Code Status: Full Code Family Communication:none at bedside during encounter  Disposition Plan:Pending clinical course and clearance by vascular surgery. Expect return to [facility]. Coming FromSNF Exp DC DateTBD Barriersas above Medically Stable for Discharge?No  Consultants:  Vascular Surgery  Nephrology  Infectious disease  Procedures:  None  Antimicrobials:  Vancomycin 2/25 >> TBD  Zosyn 2/25 >> TBD  Vanc/Cefepime/Flagyl - in ED 2/24   Objective: Vitals:   08/31/19 0900 08/31/19 1000 08/31/19 1100 08/31/19 1156  BP:  102/65 108/65 116/71   Pulse:      Resp: (!) 9 13 11 13   Temp:    97.6 F (36.4 C)  TempSrc:    Oral  SpO2:      Weight:      Height:        Intake/Output Summary (Last 24 hours) at 08/31/2019 1326 Last data filed at 08/31/2019 0600 Gross per 24 hour  Intake 100 ml  Output --  Net 100 ml   Filed Weights   08/28/19 1739 08/29/19 0528  Weight: 85 kg 80.8 kg    Examination:  General exam: awake, alert, no acute distress HEENT: moist mucus membranes, hearing grossly normal, left eye appears enucleated(?), hearing grossly normal  Respiratory system: CTAB, no wheezes, rales or rhonchi, normal respiratory effort. Cardiovascular system: normal S1/S2, RRR, no pedal edema.   Gastrointestinal system: soft, NT, ND, +bowel sounds. Central nervous system: A&O x3. no gross focal neurologic deficits, normal speech Extremities: Status post right BKA, left second toe gangrenous and malodorous, ischemic appearing skin changes of the distal left lower extremity Psychiatry: normal mood, congruent affect    Data Reviewed: I have personally reviewed following labs and imaging studies  CBC: Recent Labs  Lab 08/28/19 1746 08/29/19 0518 08/30/19 0511 08/31/19 0437 08/31/19 0830  WBC 17.6* 21.8* 20.3* 22.1* 22.3*  NEUTROABS 15.1*  --  17.5* 20.8*  --   HGB 9.8* 10.0* 9.9* 10.9* 11.0*  HCT 32.7* 32.2* 30.9* 34.5* 34.3*  MCV 89.6 88.0 85.1 85.8 84.5  PLT 228 241 248 292 [id number]   Basic Metabolic Panel: Recent Labs  Lab 08/28/19 1746 08/29/19 0518 08/30/19 0511 08/31/19 0437  NA 135 137 140 139  K 3.6 3.9 3.4* 4.0  CL 96* 97* 99 98  CO2 24 23 25 27   GLUCOSE 136* 148* 115* 190*  BUN 40* 45* 31* 46*  CREATININE 8.08* 8.66* 6.42* 7.86*  CALCIUM 8.2* 8.1* 8.3* 8.1*  MG  --   --  2.1  --   PHOS  --   --  3.2  --    GFR: Estimated Creatinine Clearance: 9.7 mL/min (A) (by C-G formula based on SCr of 7.86 mg/dL (H)). Liver Function Tests: Recent Labs  Lab 08/28/19 1746  AST 34  ALT  18  ALKPHOS 168*  BILITOT 1.6*  PROT 6.7  ALBUMIN 2.7*   No results for input(s): LIPASE, AMYLASE in the last 168 hours. No results for input(s): AMMONIA in the last 168 hours. Coagulation Profile: No results for input(s): INR, PROTIME in the last 168 hours. Cardiac Enzymes: No results for input(s): CKTOTAL, CKMB, CKMBINDEX, TROPONINI in the last 168 hours. BNP (last 3 results) No results for input(s): PROBNP in the last 8760 hours. HbA1C: Recent Labs    08/29/19 0518  HGBA1C 7.8*   CBG: Recent Labs  Lab 08/30/19 1938 08/30/19 2338 08/31/19 0341 08/31/19 0732 08/31/19 1146  GLUCAP 129* 162* 145* 163* 137*   Lipid Profile: No results for input(s): CHOL, HDL, LDLCALC, TRIG, CHOLHDL, LDLDIRECT in the last 72 hours. Thyroid Function Tests: No results for input(s): TSH, T4TOTAL, FREET4, T3FREE, THYROIDAB in the last 72 hours. Anemia Panel: No results for input(s): VITAMINB12, FOLATE, FERRITIN, TIBC, IRON, RETICCTPCT in the last 72 hours. Sepsis Labs: Recent Labs  Lab 08/28/19 1746 08/28/19 2137  PROCALCITON 2.05  --   LATICACIDVEN 0.8 0.8    Recent Results (from the past 240 hour(s))  Blood Culture (routine x 2)     Status: Abnormal  Collection Time: 08/28/19  5:46 PM   Specimen: BLOOD  Result Value Ref Range Status   Specimen Description BLOOD RESIDENT  Final   Special Requests   Final    BOTTLES DRAWN AEROBIC AND ANAEROBIC Blood Culture results may not be optimal due to an inadequate volume of blood received in culture bottles   Culture  Setup Time   Final    GRAM POSITIVE COCCI AEROBIC BOTTLE ONLY CRITICAL RESULT CALLED TO, READ BACK BY AND VERIFIED WITH: JASON ROBBINS 08/29/19 @ 1803  [place] Performed at [facility], [address] [place], [place] [address]    Culture (A)  Final    STAPHYLOCOCCUS SPECIES (COAGULASE NEGATIVE) THE SIGNIFICANCE OF ISOLATING THIS ORGANISM FROM A SINGLE SET OF BLOOD CULTURES WHEN MULTIPLE SETS ARE DRAWN IS UNCERTAIN.  PLEASE NOTIFY THE MICROBIOLOGY DEPARTMENT WITHIN ONE WEEK IF SPECIATION AND SENSITIVITIES ARE REQUIRED.    Report Status 08/31/2019 FINAL  Final  Respiratory Panel by RT PCR (Flu A&B, Covid) - Nasopharyngeal Swab     Status: None   Collection Time: 08/28/19  7:32 PM   Specimen: Nasopharyngeal Swab  Result Value Ref Range Status   SARS Coronavirus 2 by RT PCR NEGATIVE NEGATIVE Final    Comment: (NOTE) SARS-CoV-2 target nucleic acids are NOT DETECTED. The SARS-CoV-2 RNA is generally detectable in upper respiratoy specimens during the acute phase of infection. The lowest concentration of SARS-CoV-2 viral copies this assay can detect is 131 copies/mL. A negative result does not preclude SARS-Cov-2 infection and should not be used as the sole basis for treatment or other patient management decisions. A negative result may occur with  improper specimen collection/handling, submission of specimen other than nasopharyngeal swab, presence of viral mutation(s) within the areas targeted by this assay, and inadequate number of viral copies (<131 copies/mL). A negative result must be combined with clinical observations, patient history, and epidemiological information. The expected result is Negative. Fact Sheet for Patients:  PinkCheek.be Fact Sheet for Healthcare Providers:  GravelBags.it This test is not yet ap proved or cleared by the [place] FDA and  has been authorized for detection and/or diagnosis of SARS-CoV-2 by FDA under an Emergency Use Authorization (EUA). This EUA will remain  in effect (meaning this test can be used) for the duration of the COVID-19 declaration under Section 564(b)(1) of the Act, 21 U.S.C. section 360bbb-3(b)(1), unless the authorization is terminated or revoked sooner.    Influenza A by PCR NEGATIVE NEGATIVE Final   Influenza B by PCR NEGATIVE NEGATIVE Final    Comment: (NOTE) The Xpert Xpress  SARS-CoV-2/FLU/RSV assay is intended as an aid in  the diagnosis of influenza from Nasopharyngeal swab specimens and  should not be used as a sole basis for treatment. Nasal washings and  aspirates are unacceptable for Xpert Xpress SARS-CoV-2/FLU/RSV  testing. Fact Sheet for Patients: PinkCheek.be Fact Sheet for Healthcare Providers: GravelBags.it This test is not yet approved or cleared by the [place] FDA and  has been authorized for detection and/or diagnosis of SARS-CoV-2 by  FDA under an Emergency Use Authorization (EUA). This EUA will remain  in effect (meaning this test can be used) for the duration of the  Covid-19 declaration under Section 564(b)(1) of the Act, 21  U.S.C. section 360bbb-3(b)(1), unless the authorization is  terminated or revoked. Performed at [facility], [address] [place], [place] [address]   MRSA PCR Screening     Status: None   Collection Time: 08/29/19 12:16 AM  Specimen: Foot, Left; Nasopharyngeal  Result Value Ref Range Status   MRSA by PCR NEGATIVE NEGATIVE Final    Comment:        The GeneXpert MRSA Assay (FDA approved for NASAL specimens only), is one component of a comprehensive MRSA colonization surveillance program. It is not intended to diagnose MRSA infection nor to guide or monitor treatment for MRSA infections. Performed at [facility], [address] [place], [place] [address]   Blood Culture (routine x 2)     Status: None (Preliminary result)   Collection Time: 08/29/19  1:31 AM   Specimen: Leg; Blood  Result Value Ref Range Status   Specimen Description LEG  Final   Special Requests Blood Culture adequate volume  Final   Culture   Final    NO GROWTH 2 DAYS Performed at [facility], [address] [place], [place] [address]    Report Status PENDING  Incomplete  Culture, blood (single) w Reflex to ID Panel     Status: None  (Preliminary result)   Collection Time: 08/30/19  6:12 PM   Specimen: BLOOD  Result Value Ref Range Status   Specimen Description BLOOD RAC  Final   Special Requests   Final    BOTTLES DRAWN AEROBIC AND ANAEROBIC Blood Culture adequate volume   Culture   Final    NO GROWTH < 12 HOURS Performed at [facility], [address] [place], [place] [address]    Report Status PENDING  Incomplete  Culture, blood (single) w Reflex to ID Panel     Status: None (Preliminary result)   Collection Time: 08/30/19 10:27 PM   Specimen: BLOOD  Result Value Ref Range Status   Specimen Description BLOOD RIGHT ANTECUBITAL  Final   Special Requests   Final    BOTTLES DRAWN AEROBIC AND ANAEROBIC Blood Culture adequate volume   Culture   Final    NO GROWTH < 12 HOURS Performed at [facility], [address], [place], [place] [address]    Report Status PENDING  Incomplete         Radiology Studies: PERIPHERAL VASCULAR CATHETERIZATION  Result Date: 08/30/2019 See op note       Scheduled Meds: . [MAR Hold] atorvastatin  20 mg Oral q1800  . [MAR Hold] Chlorhexidine Gluconate Cloth  6 each Topical Daily  . [MAR Hold] cholecalciferol  1,000 Units Oral q1800  . [MAR Hold] ferrous sulfate  325 mg Oral q1800  . [MAR Hold] gabapentin  100 mg Oral QPM  . [MAR Hold] heparin injection (subcutaneous)  5,000 Units Subcutaneous Q8H  . [MAR Hold] insulin aspart  0-15 Units Subcutaneous Q4H  . [MAR Hold] mouth rinse  15 mL Mouth Rinse BID  . [MAR Hold] multivitamin with minerals  1 tablet Oral QHS  . [MAR Hold] polyvinyl alcohol  1 drop Left Eye QID  . [MAR Hold] sertraline  100 mg Oral QHS  . [MAR Hold] sodium chloride flush  10-40 mL Intracatheter Q12H   Continuous Infusions: . sodium chloride Stopped (08/29/19 0827)  . [MAR Hold] piperacillin-tazobactam (ZOSYN)  IV 3.375 g (08/31/19 0820)  . [MAR Hold] vancomycin 1,000 mg (08/31/19 1156)     LOS: 3 days    Time spent: 30  minutes    Ezekiel Slocumb, DO [facility]   If 7PM-7AM, please contact night-coverage [email or web address] 08/31/2019, 1:26 PM

## 2019-08-31 NOTE — Progress Notes (Signed)
Dr. Kayleen Memos aware of bp and diastolic pressure, bp cuff is on patients right leg.

## 2019-08-31 NOTE — Anesthesia Preprocedure Evaluation (Addendum)
Anesthesia Evaluation  Patient identified by MRN, date of birth, ID band Patient awake    Reviewed: Allergy & Precautions, NPO status , Patient's Chart, lab work & pertinent test results  History of Anesthesia Complications Negative for: history of anesthetic complications  Airway Mallampati: II  TM Distance: >3 FB Neck ROM: Full    Dental  (+) Upper Dentures, Lower Dentures   Pulmonary shortness of breath and with exertion, asthma , neg sleep apnea,    breath sounds clear to auscultation- rhonchi (-) wheezing      Cardiovascular hypertension, + angina + CAD, + Past MI, + Cardiac Stents, + Peripheral Vascular Disease and +CHF   Rhythm:Regular Rate:Normal - Systolic murmurs and - Diastolic murmurs    Neuro/Psych PSYCHIATRIC DISORDERS Anxiety Depression TIACVA (L sided weakness), Residual Symptoms    GI/Hepatic negative GI ROS, (+) Hepatitis -, C  Endo/Other  diabetes, Insulin Dependent  Renal/GU ESRF and DialysisRenal disease     Musculoskeletal  (+) Arthritis ,   Abdominal (+) - obese,   Peds  (+) Neurological problem Hematology  (+) anemia ,   Anesthesia Other Findings Past Medical History: No date: Anginal pain ([place]) No date: Anxiety No date: Arthritis No date: Asthma No date: CHF (congestive heart failure) (HCC) No date: Coronary artery disease No date: Depression No date: Diabetes mellitus without complication (HCC) No date: Dyspnea No date: ESRD (end stage renal disease) ([place])     Comment:  On Tuesday, Thursday and Saturday dialysis No date: Heart murmur No date: Hyperlipidemia No date: Hypertension No date: Myocardial infarction ([place]) No date: Peripheral vascular disease ([place]) No date: Polio     Comment:  childhood No date: Stroke [facility])   Reproductive/Obstetrics                             Anesthesia Physical  Anesthesia Plan  ASA: IV  Anesthesia Plan: General    Post-op Pain Management:    Induction: Intravenous  PONV Risk Score and Plan: 1 and Ondansetron and Dexamethasone  Airway Management Planned: Oral ETT  Additional Equipment:   Intra-op Plan:   Post-operative Plan: Extubation in OR  Informed Consent: I have reviewed the patients History and Physical, chart, labs and discussed the procedure including the risks, benefits and alternatives for the proposed anesthesia with the patient or authorized representative who has indicated his/her understanding and acceptance.     Dental advisory given  Plan Discussed with: CRNA and Anesthesiologist  Anesthesia Plan Comments:         Anesthesia Quick Evaluation

## 2019-08-31 NOTE — Anesthesia Procedure Notes (Signed)
Procedure Name: Intubation Date/Time: 08/31/2019 1:27 PM Performed by: Esaw Grandchild, CRNA Pre-anesthesia Checklist: Patient identified, Emergency Drugs available, Suction available and Patient being monitored Patient Re-evaluated:Patient Re-evaluated prior to induction Oxygen Delivery Method: Circle system utilized Preoxygenation: Pre-oxygenation with 100% oxygen Induction Type: IV induction Ventilation: Mask ventilation without difficulty and Oral airway inserted - appropriate to patient size Laryngoscope Size: Sabra Heck and 2 Grade View: Grade I Tube type: Oral Tube size: 8.0 mm Number of attempts: 1 Airway Equipment and Method: Stylet and Oral airway Placement Confirmation: ETT inserted through vocal cords under direct vision,  positive ETCO2 and breath sounds checked- equal and bilateral Secured at: 22 cm Tube secured with: Tape Dental Injury: Teeth and Oropharynx as per pre-operative assessment

## 2019-08-31 NOTE — Transfer of Care (Signed)
Immediate Anesthesia Transfer of Care Note  Patient: Dennis Walker  Procedure(s) Performed: AMPUTATION ABOVE KNEE (Left Knee)  Patient Location: PACU  Anesthesia Type:General  Level of Consciousness: drowsy  Airway & Oxygen Therapy: Patient Spontanous Breathing and Patient connected to face mask oxygen  Post-op Assessment: Report given to RN and Post -op Vital signs reviewed and stable  Post vital signs: Reviewed and stable  Last Vitals:  Vitals Value Taken Time  BP 142/15 08/31/19 1501  Temp    Pulse 69 08/31/19 1507  Resp 13 08/31/19 1507  SpO2 100 % 08/31/19 1507  Vitals shown include unvalidated device data.  Last Pain:  Vitals:   08/31/19 1500  TempSrc:   PainSc: Asleep      Patients Stated Pain Goal: 3 ([id number])  Complications: No apparent anesthesia complications

## 2019-08-31 NOTE — Progress Notes (Signed)
1 Day Post-Op   Subjective/Chief Complaint: Denies pain.   Objective: Vital signs in last 24 hours: Temp:  [97.6 F (36.4 C)-98.8 F (37.1 C)] 97.6 F (36.4 C) (02/27 0830) Pulse Rate:  [63-87] 77 (02/27 0000) Resp:  [9-25] 13 (02/27 1000) BP: (83-146)/(51-94) 108/65 (02/27 1000) SpO2:  [96 %-100 %] 96 % (02/27 0830) Last BM Date: 08/30/19  Intake/Output from previous day: 02/26 0701 - 02/27 0700 In: 100 [IV Piggyback:100] Out: -  Intake/Output this shift: No intake/output data recorded.  General appearance: alert and no distress Resp: clear to auscultation bilaterally Cardio: regular rate and rhythm Extremities: LEFT LEG- Foot gangrene  Lab Results:  Recent Labs    08/31/19 0437 08/31/19 0830  WBC 22.1* 22.3*  HGB 10.9* 11.0*  HCT 34.5* 34.3*  PLT 292 292   BMET Recent Labs    08/30/19 0511 08/31/19 0437  NA 140 139  K 3.4* 4.0  CL 99 98  CO2 25 27  GLUCOSE 115* 190*  BUN 31* 46*  CREATININE 6.42* 7.86*  CALCIUM 8.3* 8.1*   PT/INR No results for input(s): LABPROT, INR in the last 72 hours. ABG No results for input(s): PHART, HCO3 in the last 72 hours.  Invalid input(s): PCO2, PO2  Studies/Results: PERIPHERAL VASCULAR CATHETERIZATION  Result Date: 08/30/2019 See op note   Anti-infectives: Anti-infectives (From admission, onward)   Start     Dose/Rate Route Frequency Ordered Stop   08/31/19 0000  clindamycin (CLEOCIN) IVPB 300 mg  Status:  Discontinued    Note to Pharmacy: To be given in specials   300 mg 100 mL/hr over 30 Minutes Intravenous  Once 08/30/19 1052 08/30/19 1502   08/30/19 1423  clindamycin (CLEOCIN) 300 MG/50ML IVPB  Status:  Discontinued    Note to Pharmacy: Carlynn Spry   : cabinet override      08/30/19 1423 08/30/19 1443   08/29/19 1800  ceFEPIme (MAXIPIME) 2 g in sodium chloride 0.9 % 100 mL IVPB  Status:  Discontinued     2 g 200 mL/hr over 30 Minutes Intravenous Every 24 hours 08/29/19 0032 08/29/19 1011   08/29/19 1200  vancomycin (VANCOCIN) IVPB 1000 mg/200 mL premix     1,000 mg 200 mL/hr over 60 Minutes Intravenous Every T-Th-Sa (Hemodialysis) 08/29/19 1002     08/29/19 1015  piperacillin-tazobactam (ZOSYN) IVPB 3.375 g     3.375 g 12.5 mL/hr over 240 Minutes Intravenous Every 12 hours 08/29/19 1011     08/28/19 2100  vancomycin (VANCOREADY) IVPB 750 mg/150 mL     750 mg 150 mL/hr over 60 Minutes Intravenous  Once 08/28/19 1754 08/28/19 2210   08/28/19 1730  ceFEPIme (MAXIPIME) 2 g in sodium chloride 0.9 % 100 mL IVPB     2 g 200 mL/hr over 30 Minutes Intravenous  Once 08/28/19 1720 08/28/19 1838   08/28/19 1730  metroNIDAZOLE (FLAGYL) IVPB 500 mg     500 mg 100 mL/hr over 60 Minutes Intravenous  Once 08/28/19 1720 08/28/19 1930   08/28/19 1730  vancomycin (VANCOCIN) IVPB 1000 mg/200 mL premix     1,000 mg 200 mL/hr over 60 Minutes Intravenous  Once 08/28/19 1720 08/28/19 1955      Assessment/Plan: s/p Procedure(s): Lower Extremity Angiography (Left) Left Foot Gangrene   Extensive discussion with patient and daughter. Explained that the patient is at high risk of contracture as he is bed bound and does not ambulate. In addition he is at risk of subsequent potential stump infection if contracture occurs  secondary to pressure and underlying infection he may require further surgery. I explained in the longrun it would be best. All questions answered. Understanding expressed. They are agreeable to AKA. Consent obtained.   LOS: 3 days    Dennis Walker 08/31/2019

## 2019-08-31 NOTE — Progress Notes (Signed)
Dennis Walker  MRN: [id number]  DOB/AGE: 02-02-1951 69 y.o.  Primary Care Physician:Bronstein, Shanon Brow, MD  Admit date: 08/28/2019  Chief Complaint:  Chief Complaint  Patient presents with  . Wound Infection    S-Pt presented on  08/28/2019 with  Chief Complaint  Patient presents with  . Wound Infection  . Patient offers no specific complaints.  Patient main concern today was waiting to go for my surgery  Medications . atorvastatin  20 mg Oral q1800  . Chlorhexidine Gluconate Cloth  6 each Topical Daily  . cholecalciferol  1,000 Units Oral q1800  . ferrous sulfate  325 mg Oral q1800  . gabapentin  100 mg Oral QPM  . heparin injection (subcutaneous)  5,000 Units Subcutaneous Q8H  . insulin aspart  0-15 Units Subcutaneous Q4H  . mouth rinse  15 mL Mouth Rinse BID  . multivitamin with minerals  1 tablet Oral QHS  . polyvinyl alcohol  1 drop Left Eye QID  . sertraline  100 mg Oral QHS  . sodium chloride flush  10-40 mL Intracatheter Q12H         [id number] from the symptoms mentioned above,there are no other symptoms referable to all systems reviewed.  Physical Exam: Vital signs in last 24 hours: Temp:  [97.6 F (36.4 C)-98.8 F (37.1 C)] 97.6 F (36.4 C) (02/27 0830) Pulse Rate:  [63-87] 77 (02/27 0000) Resp:  [12-25] 12 (02/27 0830) BP: (83-146)/(51-94) 101/59 (02/27 0800) SpO2:  [96 %-100 %] 96 % (02/27 0830) Weight change:  Last BM Date: 08/30/19  Intake/Output from previous day: 02/26 0701 - 02/27 0700 In: 100 [IV Piggyback:100] Out: -  No intake/output data recorded.   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 Access left AV fistula  Lab Results: CBC Recent Labs    08/31/19 0437 08/31/19 0830  WBC 22.1* 22.3*  HGB 10.9* 11.0*  HCT 34.5*  34.3*  PLT 292 292    BMET Recent Labs    08/30/19 0511 08/31/19 0437  NA 140 139  K 3.4* 4.0  CL 99 98  CO2 25 27  GLUCOSE 115* 190*  BUN 31* 46*  CREATININE 6.42* 7.86*  CALCIUM 8.3* 8.1*    MICRO Recent Results (from the past 240 hour(s))  Blood Culture (routine x 2)     Status: Abnormal   Collection Time: 08/28/19  5:46 PM   Specimen: BLOOD  Result Value Ref Range Status   Specimen Description BLOOD RESIDENT  Final   Special Requests   Final    BOTTLES DRAWN AEROBIC AND ANAEROBIC Blood Culture results may not be optimal due to an inadequate volume of blood received in culture bottles   Culture  Setup Time   Final    GRAM POSITIVE COCCI AEROBIC BOTTLE ONLY CRITICAL RESULT CALLED TO, READ BACK BY AND VERIFIED WITH: JASON ROBBINS 08/29/19 @ 1803  [place] Performed at [facility], [address] [place], [place] [address]    Culture (A)  Final    STAPHYLOCOCCUS SPECIES (COAGULASE NEGATIVE) THE SIGNIFICANCE OF ISOLATING THIS ORGANISM FROM A SINGLE SET OF BLOOD CULTURES WHEN MULTIPLE SETS ARE DRAWN IS UNCERTAIN. PLEASE NOTIFY THE MICROBIOLOGY DEPARTMENT WITHIN ONE WEEK IF SPECIATION AND SENSITIVITIES ARE REQUIRED.    Report Status 08/31/2019 FINAL  Final  Respiratory  Panel by RT PCR (Flu A&B, Covid) - Nasopharyngeal Swab     Status: None   Collection Time: 08/28/19  7:32 PM   Specimen: Nasopharyngeal Swab  Result Value Ref Range Status   SARS Coronavirus 2 by RT PCR NEGATIVE NEGATIVE Final    Comment: (NOTE) SARS-CoV-2 target nucleic acids are NOT DETECTED. The SARS-CoV-2 RNA is generally detectable in upper respiratoy specimens during the acute phase of infection. The lowest concentration of SARS-CoV-2 viral copies this assay can detect is 131 copies/mL. A negative result does not preclude SARS-Cov-2 infection and should not be used as the sole basis for treatment or other patient management decisions. A negative result may occur with  improper specimen  collection/handling, submission of specimen other than nasopharyngeal swab, presence of viral mutation(s) within the areas targeted by this assay, and inadequate number of viral copies (<131 copies/mL). A negative result must be combined with clinical observations, patient history, and epidemiological information. The expected result is Negative. Fact Sheet for Patients:  PinkCheek.be Fact Sheet for Healthcare Providers:  GravelBags.it This test is not yet ap proved or cleared by the [place] FDA and  has been authorized for detection and/or diagnosis of SARS-CoV-2 by FDA under an Emergency Use Authorization (EUA). This EUA will remain  in effect (meaning this test can be used) for the duration of the COVID-19 declaration under Section 564(b)(1) of the Act, 21 U.S.C. section 360bbb-3(b)(1), unless the authorization is terminated or revoked sooner.    Influenza A by PCR NEGATIVE NEGATIVE Final   Influenza B by PCR NEGATIVE NEGATIVE Final    Comment: (NOTE) The Xpert Xpress SARS-CoV-2/FLU/RSV assay is intended as an aid in  the diagnosis of influenza from Nasopharyngeal swab specimens and  should not be used as a sole basis for treatment. Nasal washings and  aspirates are unacceptable for Xpert Xpress SARS-CoV-2/FLU/RSV  testing. Fact Sheet for Patients: PinkCheek.be Fact Sheet for Healthcare Providers: GravelBags.it This test is not yet approved or cleared by the [place] FDA and  has been authorized for detection and/or diagnosis of SARS-CoV-2 by  FDA under an Emergency Use Authorization (EUA). This EUA will remain  in effect (meaning this test can be used) for the duration of the  Covid-19 declaration under Section 564(b)(1) of the Act, 21  U.S.C. section 360bbb-3(b)(1), unless the authorization is  terminated or revoked. Performed at [facility],  [address] [place], [place] [address]   MRSA PCR Screening     Status: None   Collection Time: 08/29/19 12:16 AM   Specimen: Foot, Left; Nasopharyngeal  Result Value Ref Range Status   MRSA by PCR NEGATIVE NEGATIVE Final    Comment:        The GeneXpert MRSA Assay (FDA approved for NASAL specimens only), is one component of a comprehensive MRSA colonization surveillance program. It is not intended to diagnose MRSA infection nor to guide or monitor treatment for MRSA infections. Performed at [facility], [address] [place], [place] [address]   Blood Culture (routine x 2)     Status: None (Preliminary result)   Collection Time: 08/29/19  1:31 AM   Specimen: Leg; Blood  Result Value Ref Range Status   Specimen Description LEG  Final   Special Requests Blood Culture adequate volume  Final   Culture   Final    NO GROWTH 2 DAYS Performed at [facility], [address] [place],  [address]    Report Status PENDING  Incomplete  Culture, blood (single) w Reflex to ID Panel     Status: None (Preliminary result)   Collection Time: 08/30/19  6:12 PM   Specimen: BLOOD  Result Value Ref Range Status   Specimen Description BLOOD RAC  Final   Special Requests   Final    BOTTLES DRAWN AEROBIC AND ANAEROBIC Blood Culture adequate volume   Culture   Final    NO GROWTH < 12 HOURS Performed at [facility], [address], [place], [place] [address]    Report Status PENDING  Incomplete  Culture, blood (single) w Reflex to ID Panel     Status: None (Preliminary result)   Collection Time: 08/30/19 10:27 PM   Specimen: BLOOD  Result Value Ref Range Status   Specimen Description BLOOD RIGHT ANTECUBITAL  Final   Special Requests   Final    BOTTLES DRAWN AEROBIC AND ANAEROBIC Blood Culture adequate volume   Culture   Final    NO GROWTH < 12 HOURS Performed at [facility], [address] [place], [place] [address]    Report  Status PENDING  Incomplete      Lab Results  Component Value Date   CALCIUM 8.1 (L) 08/31/2019   CAION 1.01 (L) 08/07/2019   PHOS 3.2 08/30/2019               Impression:  Mr. CHROSTOPHER TELLMAN is a 69 year old African-American male with end stage renal disease on hemodialysis, hypertension, childhood polio, hyperlipidemia, CVA with left sided residual weakness, peripheral vascular disease, coronary artery disease, diabetes mellitus type II, diabetic neuropathy, congestive heart failure, asthma.    1)Renal ESRD Patient is on hemodialysis Patient is on Tuesday Thursday Saturday schedule. No hyperkalemia/no fluid overload and patient is to undergo surgery today so Patient will be dialyzed tomorrow if needed  2) hypotension Patient was admitted with septic shock Patient required vasopressors earlier. Patient is now better  3)Anemia of chronic disease  HGb at goal (9--11)   4) secondary hyperparathyroidism -CKD Mineral-Bone Disorder   Secondary Hyperparathyroidism  Present. Patient has intact PTH levels of 662 on August 12, 2019.  Phosphorus at goal.   5) peripheral vascular disease with gangrene Patient is being followed by vascular. Patient is scheduled for amputation today  6) electrolytes   sodium Normonatremic   potassium Normokalemic    7)Acid base Co2 at goal     Plan:   No acute need for renal placement therapy today We will follow closely     Kanton Kamel s [facility] 08/31/2019, 10:16 AM

## 2019-09-01 ENCOUNTER — Encounter: Payer: Self-pay | Admitting: Family Medicine

## 2019-09-01 LAB — CBC WITH DIFFERENTIAL/PLATELET
Abs Immature Granulocytes: 0.22 10*3/uL — ABNORMAL HIGH (ref 0.00–0.07)
Basophils Absolute: 0 10*3/uL (ref 0.0–0.1)
Basophils Relative: 0 %
Eosinophils Absolute: 0 10*3/uL (ref 0.0–0.5)
Eosinophils Relative: 0 %
HCT: 31.4 % — ABNORMAL LOW (ref 39.0–52.0)
Hemoglobin: 9.8 g/dL — ABNORMAL LOW (ref 13.0–17.0)
Immature Granulocytes: 1 %
Lymphocytes Relative: 3 %
Lymphs Abs: 0.7 10*3/uL (ref 0.7–4.0)
MCH: 26.3 pg (ref 26.0–34.0)
MCHC: 31.2 g/dL (ref 30.0–36.0)
MCV: 84.2 fL (ref 80.0–100.0)
Monocytes Absolute: 0.8 10*3/uL (ref 0.1–1.0)
Monocytes Relative: 4 %
Neutro Abs: 18 10*3/uL — ABNORMAL HIGH (ref 1.7–7.7)
Neutrophils Relative %: 92 %
Platelets: 297 10*3/uL (ref 150–400)
RBC: 3.73 MIL/uL — ABNORMAL LOW (ref 4.22–5.81)
RDW: 17.2 % — ABNORMAL HIGH (ref 11.5–15.5)
WBC: 19.8 10*3/uL — ABNORMAL HIGH (ref 4.0–10.5)
nRBC: 0.1 % (ref 0.0–0.2)

## 2019-09-01 LAB — BASIC METABOLIC PANEL
Anion gap: 15 (ref 5–15)
BUN: 64 mg/dL — ABNORMAL HIGH (ref 8–23)
CO2: 25 mmol/L (ref 22–32)
Calcium: 8.2 mg/dL — ABNORMAL LOW (ref 8.9–10.3)
Chloride: 97 mmol/L — ABNORMAL LOW (ref 98–111)
Creatinine, Ser: 9.04 mg/dL — ABNORMAL HIGH (ref 0.61–1.24)
GFR calc Af Amer: 6 mL/min — ABNORMAL LOW (ref >60)
GFR calc non Af Amer: 5 mL/min — ABNORMAL LOW (ref >60)
Glucose, Bld: 174 mg/dL — ABNORMAL HIGH (ref 70–99)
Potassium: 4.4 mmol/L (ref 3.5–5.1)
Sodium: 137 mmol/L (ref 135–145)

## 2019-09-01 LAB — GLUCOSE, CAPILLARY
Glucose-Capillary: 109 mg/dL — ABNORMAL HIGH (ref 70–99)
Glucose-Capillary: 132 mg/dL — ABNORMAL HIGH (ref 70–99)
Glucose-Capillary: 133 mg/dL — ABNORMAL HIGH (ref 70–99)
Glucose-Capillary: 144 mg/dL — ABNORMAL HIGH (ref 70–99)
Glucose-Capillary: 148 mg/dL — ABNORMAL HIGH (ref 70–99)
Glucose-Capillary: 173 mg/dL — ABNORMAL HIGH (ref 70–99)
Glucose-Capillary: 182 mg/dL — ABNORMAL HIGH (ref 70–99)

## 2019-09-01 MED ORDER — VANCOMYCIN VARIABLE DOSE PER UNSTABLE RENAL FUNCTION (PHARMACIST DOSING): Status: DC

## 2019-09-01 MED ORDER — METOPROLOL SUCCINATE ER 25 MG PO TB24: 25.0000 mg | ORAL_TABLET | Freq: Every evening | ORAL | Status: DC

## 2019-09-01 MED ORDER — DIPHENHYDRAMINE HCL 25 MG PO CAPS
25.0000 mg | ORAL_CAPSULE | Freq: Four times a day (QID) | ORAL | Status: DC | PRN
  Administered 2019-09-01 – 2019-09-05 (×3): 25 mg via ORAL
  Filled 2019-09-01 (×3): qty 1

## 2019-09-01 NOTE — Progress Notes (Addendum)
Progress Note    Dennis Walker  [id number] DOB: 1950/10/20  DOA: 08/28/2019 PCP: Juluis Pitch, MD      Brief Narrative:    Medical records reviewed and are as summarized below:  Dennis Walker is an 69 y.o. malewithextensive pastmedical historyincludinghx of CAD s/p PCI to the proximal LAD, chronic HFpEF, stroke, PAD s/p right BKA, ESRD on HD Tues/Thurs/Sat,type 2 diabetes, childhood polio,blindnessand asthma who [facility]. The toe had become darker and malodorous over few days prior. Patient is followed closely outpatient by vascular surgery for severe PVD. In the ED, febrile T-max 101F,and hypotensive. Labs showed leukocytosis of 17.6k and stable anemia. He was treated with broad-spectrum antibiotics and required admission to ICU for vasopressors.  He wasweaned off of pressors the following morning and care was transferred to the hospitalist service.    Assessment/Plan:   Principal Problem:   Gangrenous toe (HCC) Active Problems:   Hypertension   ESRD (end stage renal disease) (HCC)   Chronic systolic heart failure (HCC)   Hypotension   PVD (peripheral vascular disease) (HCC)   Hx of BKA, right (HCC)   Severe sepsis (HCC)   Gangrene of left foot ([place])   Sepsis ([place])   Advanced care planning/counseling discussion   Goals of care, counseling/discussion   Palliative care by specialist   Severe sepsissecondary to gangrenous toe-present on admission as evidenced by fever, hypotension requiring vasopressor(due to ESRD to prevent volume overload)and infected gangrenous toe.  Continue empiric IV antibiotics.  PVD with left gangrenous toe-present on admission, left second toe. s/p left AKA on 08/31/2019- ID consulted.  Aspirin and Plavix on hold.  Coagulase-negative Staph Positive Blood culture - on initial cultures, likely contaminate --repeat blood cultures pending, follow  up  Hypertension- with recent hypotension on admission.  Antihypertensives on hold.  ESRD (end stage renal disease) --nephrology following for dialysis  Chronic diastolic heart failure-compensated.  Metoprolol on hold.  Echo in August 2019 showed EF estimated at 50 to 55% and grade 2 diastolic dysfunction.  Hx ofRightBKA- due to severe PVD. Residual distal limb appears healthy.  Anemia of chronic disease-secondary to ESRD. No evidence of active blood loss. --Monitor CBC     Body mass index is 23.28 kg/m.   Family Communication/Anticipated D/C date and plan/Code Status   DVT prophylaxis: Heparin Code Status: Full code Family Communication: Plan discussed with patient Disposition Plan: Patient is from the nursing home and plan is to discharge the patient back to nursing home once he is medically stable for discharge.  Patient is to be cleared for discharge by vascular surgeon because of recent AKA      Subjective:   C/o pain in the left AKA stump.   Objective:    Vitals:   09/01/19 1200 09/01/19 1300 09/01/19 1400 09/01/19 1500  BP: (!) 156/33 (!) 144/105 (!) 129/46 (!) 129/40  Pulse:      Resp: 13 14 16 11   Temp: 98.6 F (37 C)     TempSrc: Oral     SpO2: 100%     Weight:      Height:        Intake/Output Summary (Last 24 hours) at 09/01/2019 1621 Last data filed at 09/01/2019 1200 Gross per 24 hour  Intake 515.59 ml  Output --  Net 515.59 ml   Filed Weights   08/28/19 1739 08/29/19 0528 09/01/19 0600  Weight: 85 kg 80.8 kg 75.7  kg    Exam:  GEN: NAD SKIN: No rash EYES: Abnormal right eye ENT: MMM CV: RRR PULM: CTA B ABD: soft, ND, NT, +BS CNS: AAO x 3, non focal EXT: Old right BKA. New left AKA   Data Reviewed:   I have personally reviewed following labs and imaging studies:  Labs: Labs show the following:   Basic Metabolic Panel: Recent Labs  Lab 08/28/19 1746 08/28/19 1746 08/29/19 0518 08/29/19 0518  08/30/19 0511 08/30/19 0511 08/31/19 0437 09/01/19 0418  NA 135  --  137  --  140  --  139 137  K 3.6   < > 3.9   < > 3.4*   < > 4.0 4.4  CL 96*  --  97*  --  99  --  98 97*  CO2 24  --  23  --  25  --  27 25  GLUCOSE 136*  --  148*  --  115*  --  190* 174*  BUN 40*  --  45*  --  31*  --  46* 64*  CREATININE 8.08*  --  8.66*  --  6.42*  --  7.86* 9.04*  CALCIUM 8.2*  --  8.1*  --  8.3*  --  8.1* 8.2*  MG  --   --   --   --  2.1  --   --   --   PHOS  --   --   --   --  3.2  --   --   --    < > = values in this interval not displayed.   GFR Estimated Creatinine Clearance: 8.4 mL/min (A) (by C-G formula based on SCr of 9.04 mg/dL (H)). Liver Function Tests: Recent Labs  Lab 08/28/19 1746  AST 34  ALT 18  ALKPHOS 168*  BILITOT 1.6*  PROT 6.7  ALBUMIN 2.7*   No results for input(s): LIPASE, AMYLASE in the last 168 hours. No results for input(s): AMMONIA in the last 168 hours. Coagulation profile No results for input(s): INR, PROTIME in the last 168 hours.  CBC: Recent Labs  Lab 08/28/19 1746 08/28/19 1746 08/29/19 0518 08/30/19 0511 08/31/19 0437 08/31/19 0830 09/01/19 0418  WBC 17.6*   < > 21.8* 20.3* 22.1* 22.3* 19.8*  NEUTROABS 15.1*  --   --  17.5* 20.8*  --  18.0*  HGB 9.8*   < > 10.0* 9.9* 10.9* 11.0* 9.8*  HCT 32.7*   < > 32.2* 30.9* 34.5* 34.3* 31.4*  MCV 89.6   < > 88.0 85.1 85.8 84.5 84.2  PLT 228   < > 241 248 292 292 297   < > = values in this interval not displayed.   Cardiac Enzymes: No results for input(s): CKTOTAL, CKMB, CKMBINDEX, TROPONINI in the last 168 hours. BNP (last 3 results) No results for input(s): PROBNP in the last 8760 hours. CBG: Recent Labs  Lab 08/31/19 1935 09/01/19 0000 09/01/19 0345 09/01/19 0717 09/01/19 1136  GLUCAP 181* 182* 173* 144* 148*   D-Dimer: No results for input(s): DDIMER in the last 72 hours. Hgb A1c: No results for input(s): HGBA1C in the last 72 hours. Lipid Profile: No results for input(s): CHOL,  HDL, LDLCALC, TRIG, CHOLHDL, LDLDIRECT in the last 72 hours. Thyroid function studies: No results for input(s): TSH, T4TOTAL, T3FREE, THYROIDAB in the last 72 hours.  Invalid input(s): FREET3 Anemia work up: No results for input(s): VITAMINB12, FOLATE, FERRITIN, TIBC, IRON, RETICCTPCT in the last 72 hours. Sepsis  Labs: Recent Labs  Lab 08/28/19 1746 08/28/19 2137 08/29/19 0518 08/30/19 0511 08/31/19 0437 08/31/19 0830 09/01/19 0418  PROCALCITON 2.05  --   --   --   --   --   --   WBC 17.6*  --    < > 20.3* 22.1* 22.3* 19.8*  LATICACIDVEN 0.8 0.8  --   --   --   --   --    < > = values in this interval not displayed.    Microbiology Recent Results (from the past 240 hour(s))  Blood Culture (routine x 2)     Status: Abnormal   Collection Time: 08/28/19  5:46 PM   Specimen: BLOOD  Result Value Ref Range Status   Specimen Description BLOOD RESIDENT  Final   Special Requests   Final    BOTTLES DRAWN AEROBIC AND ANAEROBIC Blood Culture results may not be optimal due to an inadequate volume of blood received in culture bottles   Culture  Setup Time   Final    GRAM POSITIVE COCCI AEROBIC BOTTLE ONLY CRITICAL RESULT CALLED TO, READ BACK BY AND VERIFIED WITH: JASON ROBBINS 08/29/19 @ 1803  [place] Performed at [facility], [address] [place],  [address]    Culture (A)  Final    STAPHYLOCOCCUS SPECIES (COAGULASE NEGATIVE) THE SIGNIFICANCE OF ISOLATING THIS ORGANISM FROM A SINGLE SET OF BLOOD CULTURES WHEN MULTIPLE SETS ARE DRAWN IS UNCERTAIN. PLEASE NOTIFY THE MICROBIOLOGY DEPARTMENT WITHIN ONE WEEK IF SPECIATION AND SENSITIVITIES ARE REQUIRED.    Report Status 08/31/2019 FINAL  Final  Respiratory Panel by RT PCR (Flu A&B, Covid) - Nasopharyngeal Swab     Status: None   Collection Time: 08/28/19  7:32 PM   Specimen: Nasopharyngeal Swab  Result Value Ref Range Status   SARS Coronavirus 2 by RT PCR NEGATIVE NEGATIVE Final    Comment: (NOTE) SARS-CoV-2 target  nucleic acids are NOT DETECTED. The SARS-CoV-2 RNA is generally detectable in upper respiratoy specimens during the acute phase of infection. The lowest concentration of SARS-CoV-2 viral copies this assay can detect is 131 copies/mL. A negative result does not preclude SARS-Cov-2 infection and should not be used as the sole basis for treatment or other patient management decisions. A negative result may occur with  improper specimen collection/handling, submission of specimen other than nasopharyngeal swab, presence of viral mutation(s) within the areas targeted by this assay, and inadequate number of viral copies (<131 copies/mL). A negative result must be combined with clinical observations, patient history, and epidemiological information. The expected result is Negative. Fact Sheet for Patients:  PinkCheek.be Fact Sheet for Healthcare Providers:  GravelBags.it This test is not yet ap proved or cleared by the [place] FDA and  has been authorized for detection and/or diagnosis of SARS-CoV-2 by FDA under an Emergency Use Authorization (EUA). This EUA will remain  in effect (meaning this test can be used) for the duration of the COVID-19 declaration under Section 564(b)(1) of the Act, 21 U.S.C. section 360bbb-3(b)(1), unless the authorization is terminated or revoked sooner.    Influenza A by PCR NEGATIVE NEGATIVE Final   Influenza B by PCR NEGATIVE NEGATIVE Final    Comment: (NOTE) The Xpert Xpress SARS-CoV-2/FLU/RSV assay is intended as an aid in  the diagnosis of influenza from Nasopharyngeal swab specimens and  should not be used as a sole basis for treatment. Nasal washings and  aspirates are unacceptable for Xpert Xpress SARS-CoV-2/FLU/RSV  testing. Fact Sheet for Patients: PinkCheek.be Fact Sheet  for Healthcare Providers: GravelBags.it This test is not  yet approved or cleared by the [place] and  has been authorized for detection and/or diagnosis of SARS-CoV-2 by  FDA under an Emergency Use Authorization (EUA). This EUA will remain  in effect (meaning this test can be used) for the duration of the  Covid-19 declaration under Section 564(b)(1) of the Act, 21  U.S.C. section 360bbb-3(b)(1), unless the authorization is  terminated or revoked. Performed at [facility], [address] [place], [place] [address]   MRSA PCR Screening     Status: None   Collection Time: 08/29/19 12:16 AM   Specimen: Foot, Left; Nasopharyngeal  Result Value Ref Range Status   MRSA by PCR NEGATIVE NEGATIVE Final    Comment:        The GeneXpert MRSA Assay (FDA approved for NASAL specimens only), is one component of a comprehensive MRSA colonization surveillance program. It is not intended to diagnose MRSA infection nor to guide or monitor treatment for MRSA infections. Performed at [facility], [address] [place], [place] [address]   Blood Culture (routine x 2)     Status: None (Preliminary result)   Collection Time: 08/29/19  1:31 AM   Specimen: Leg; Blood  Result Value Ref Range Status   Specimen Description LEG  Final   Special Requests Blood Culture adequate volume  Final   Culture   Final    NO GROWTH 3 DAYS Performed at [facility], [address] [place], [place] [address]    Report Status PENDING  Incomplete  Culture, blood (single) w Reflex to ID Panel     Status: None (Preliminary result)   Collection Time: 08/30/19  6:12 PM   Specimen: BLOOD  Result Value Ref Range Status   Specimen Description BLOOD RAC  Final   Special Requests   Final    BOTTLES DRAWN AEROBIC AND ANAEROBIC Blood Culture adequate volume   Culture   Final    NO GROWTH 2 DAYS Performed at [facility], [address] [place], [place] [address]    Report Status PENDING  Incomplete  Culture, blood (single)  w Reflex to ID Panel     Status: None (Preliminary result)   Collection Time: 08/30/19 10:27 PM   Specimen: BLOOD  Result Value Ref Range Status   Specimen Description BLOOD RIGHT ANTECUBITAL  Final   Special Requests   Final    BOTTLES DRAWN AEROBIC AND ANAEROBIC Blood Culture adequate volume   Culture   Final    NO GROWTH 2 DAYS Performed at [facility], [address], [place], [place] [address]    Report Status PENDING  Incomplete    Procedures and diagnostic studies:  No results found.  Medications:   . atorvastatin  20 mg Oral q1800  . Chlorhexidine Gluconate Cloth  6 each Topical Daily  . cholecalciferol  1,000 Units Oral q1800  . ferrous sulfate  325 mg Oral q1800  . gabapentin  100 mg Oral QPM  . heparin injection (subcutaneous)  5,000 Units Subcutaneous Q8H  . insulin aspart  0-15 Units Subcutaneous Q4H  . mouth rinse  15 mL Mouth Rinse BID  . multivitamin with minerals  1 tablet Oral QHS  . polyvinyl alcohol  1 drop Left Eye QID  . sertraline  100 mg Oral QHS  . sodium chloride flush  10-40 mL Intracatheter Q12H  . vancomycin variable dose per unstable renal function (pharmacist dosing)  Does not apply See admin instructions   Continuous Infusions: . sodium chloride 10 mL/hr at 09/01/19 0600  . piperacillin-tazobactam (ZOSYN)  IV 3.375 g (09/01/19 1143)     LOS: 4 days   Phinehas Grounds  [facility]     09/01/2019, 4:21 PM

## 2019-09-01 NOTE — Progress Notes (Signed)
1 Day Post-Op   Subjective/Chief Complaint: Doing OK. Complains of pain in LEFT stump- controlled with current regimen.    Objective: Vital signs in last 24 hours: Temp:  [97.6 F (36.4 C)-98.4 F (36.9 C)] 98.4 F (36.9 C) (02/28 0800) Pulse Rate:  [69] 69 (02/27 1533) Resp:  [0-23] 19 (02/28 1100) BP: (93-144)/(12-89) 136/60 (02/28 1100) SpO2:  [97 %-100 %] 100 % (02/28 0800) Weight:  [75.7 kg] 75.7 kg (02/28 0600) Last BM Date: 08/31/19  Intake/Output from previous day: 02/27 0701 - 02/28 0700 In: 645.6 [I.V.:305.1; IV Piggyback:340.5] Out: 200 [Blood:200] Intake/Output this shift: No intake/output data recorded.  General appearance: alert and no distress Cardio: regular rate and rhythm Extremities: LEFT AKA stump dressing -C/D/I, thigh soft  Lab Results:  Recent Labs    08/31/19 0830 09/01/19 0418  WBC 22.3* 19.8*  HGB 11.0* 9.8*  HCT 34.3* 31.4*  PLT 292 297   BMET Recent Labs    08/31/19 0437 09/01/19 0418  NA 139 137  K 4.0 4.4  CL 98 97*  CO2 27 25  GLUCOSE 190* 174*  BUN 46* 64*  CREATININE 7.86* 9.04*  CALCIUM 8.1* 8.2*   PT/INR No results for input(s): LABPROT, INR in the last 72 hours. ABG No results for input(s): PHART, HCO3 in the last 72 hours.  Invalid input(s): PCO2, PO2  Studies/Results: PERIPHERAL VASCULAR CATHETERIZATION  Result Date: 08/30/2019 See op note   Anti-infectives: Anti-infectives (From admission, onward)   Start     Dose/Rate Route Frequency Ordered Stop   08/31/19 0000  clindamycin (CLEOCIN) IVPB 300 mg  Status:  Discontinued    Note to Pharmacy: To be given in specials   300 mg 100 mL/hr over 30 Minutes Intravenous  Once 08/30/19 1052 08/30/19 1502   08/30/19 1423  clindamycin (CLEOCIN) 300 MG/50ML IVPB  Status:  Discontinued    Note to Pharmacy: Carlynn Spry   : cabinet override      08/30/19 1423 08/30/19 1443   08/29/19 1800  ceFEPIme (MAXIPIME) 2 g in sodium chloride 0.9 % 100 mL IVPB  Status:   Discontinued     2 g 200 mL/hr over 30 Minutes Intravenous Every 24 hours 08/29/19 0032 08/29/19 1011   08/29/19 1200  vancomycin (VANCOCIN) IVPB 1000 mg/200 mL premix     1,000 mg 200 mL/hr over 60 Minutes Intravenous Every T-Th-Sa (Hemodialysis) 08/29/19 1002     08/29/19 1015  piperacillin-tazobactam (ZOSYN) IVPB 3.375 g     3.375 g 12.5 mL/hr over 240 Minutes Intravenous Every 12 hours 08/29/19 1011     08/28/19 2100  vancomycin (VANCOREADY) IVPB 750 mg/150 mL     750 mg 150 mL/hr over 60 Minutes Intravenous  Once 08/28/19 1754 08/28/19 2210   08/28/19 1730  ceFEPIme (MAXIPIME) 2 g in sodium chloride 0.9 % 100 mL IVPB     2 g 200 mL/hr over 30 Minutes Intravenous  Once 08/28/19 1720 08/28/19 1838   08/28/19 1730  metroNIDAZOLE (FLAGYL) IVPB 500 mg     500 mg 100 mL/hr over 60 Minutes Intravenous  Once 08/28/19 1720 08/28/19 1930   08/28/19 1730  vancomycin (VANCOCIN) IVPB 1000 mg/200 mL premix     1,000 mg 200 mL/hr over 60 Minutes Intravenous  Once 08/28/19 1720 08/28/19 1955      Assessment/Plan: s/p Procedure(s): AMPUTATION ABOVE KNEE (Left) POD #1 S/P LEFT AKA   Continue supportive care, pain control Will change dressing POD #3   LOS: 4 days  Jamesetta So A 09/01/2019

## 2019-09-01 NOTE — Anesthesia Postprocedure Evaluation (Signed)
Anesthesia Post Note  Patient: Dennis Walker  Procedure(s) Performed: AMPUTATION ABOVE KNEE (Left Knee)  Patient location during evaluation: ICU Anesthesia Type: General Level of consciousness: awake and alert and oriented Pain management: pain level controlled Vital Signs Assessment: post-procedure vital signs reviewed and stable Respiratory status: spontaneous breathing Cardiovascular status: blood pressure returned to baseline Anesthetic complications: no     Last Vitals:  Vitals:   09/01/19 1100 09/01/19 1200  BP: 136/60 (!) 156/33  Pulse:    Resp: 19 13  Temp:  37 C  SpO2:  100%    Last Pain:  Vitals:   09/01/19 1200  TempSrc: Oral  PainSc: 0-No pain                 Thadd Apuzzo

## 2019-09-01 NOTE — TOC Initial Note (Signed)
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 Patient was sleeping when this social worker arrived. This Education officer, museum met with patient's daughter at bedside. Per patient's daughter, patient will return to [facility] post hospitalization and will probably need rehab. Patient has been residing at [facility] since 2018, however per patient's daughter she has grown frustrated with his care there. Her ultimately plan is to have him live with her once she has made the needed home modifications. This social worker encouraged patient's daughter to discuss her concerns with the facility in order to address patient's needs. Discussed anticipated needs that patient may have upon ultimate discharge to her home including DME needs, personal care services and a wheelchair ramp.   TOC CM/SW to continue to follow to coordinate patient's discharge back to [facility].   Lamiracle Chaidez, LCSW Clinical Social Work [phone]   Expected Discharge Plan: [place] Barriers to Discharge: No Barriers Identified   Patient Goals and CMS Choice        Expected Discharge Plan and Services Expected Discharge Plan: [place] In-house Referral: Clinical Social Work   Post Acute Care Choice: Long Term Acute Care (LTAC) Living arrangements for the past 2 months: Skilled Nursing Facility(patient currently from [facility]-long term care)                                      Prior Living Arrangements/Services Living arrangements for the past 2 months: Skilled Nursing Facility(patient currently from [facility]-long  term care) Lives with:: Other (Comment)([facility]) Patient language and need for interpreter reviewed:: Yes Do you feel safe going back to the place where you live?: Yes(Per patient's daughter, [facility] will return [facility] but she reports being frustrated regarding his care)      Need for Family Participation in Patient Care: Yes (Comment) Care giver support system in place?: Yes (comment)   Criminal Activity/Legal Involvement Pertinent to Current Situation/Hospitalization: No - Comment as needed  Activities of Daily Living Home Assistive Devices/Equipment: Prosthesis, Wheelchair ADL Screening (condition at time of admission) Patient's cognitive ability adequate to safely complete daily activities?: No Is the patient deaf or have difficulty hearing?: No Does the patient have difficulty seeing, even when wearing glasses/contacts?: Yes Does the patient have difficulty concentrating, remembering, or making decisions?: Yes Patient able to express need for assistance with ADLs?: Yes Does the patient have difficulty dressing or bathing?: Yes Independently performs ADLs?: No Communication: Needs assistance, Dependent Is this a change from baseline?: Pre-admission baseline Dressing (OT): Dependent, Needs assistance Is this a change from baseline?: Pre-admission baseline Grooming: Dependent, Needs assistance Is this a change from baseline?: Pre-admission baseline Feeding: Dependent, Needs assistance Is this a change from baseline?: Pre-admission baseline Bathing: Dependent Is this a change from baseline?: Pre-admission baseline Toileting: Dependent Is this a change from baseline?: Pre-admission baseline In/Out Bed: Dependent Is this a change from baseline?: Pre-admission baseline Walks in Home: Dependent Is this a change from baseline?: Pre-admission baseline Does  the patient have difficulty walking or climbing stairs?: Yes Weakness of Legs: Both Weakness of Arms/Hands:  Both  Permission Sought/Granted   Permission granted to share information with : Yes, Release of Information Signed(patient's daughter is POA)        Permission granted to share info w Relationship: latoya scott-daughter  Permission granted to share info w Contact Information: [phone]  Emotional Assessment Appearance:: Appears older than stated age Attitude/Demeanor/Rapport: (patient sleeping, CSW spoke with daughter)   Orientation: : Fluctuating Orientation (Suspected and/or reported Sundowners)(per daughter) Alcohol / Substance Use: Not Applicable Psych Involvement: No (comment)  Admission diagnosis:  Gangrenous toe ([place]) [I96] Sepsis ([place]) [A41.9] Gangrene of left foot ([place]) [I96] Sepsis, due to unspecified organism, unspecified whether acute organ dysfunction present [facility]) [A41.9] Patient Active Problem List   Diagnosis Date Noted  . Gangrene of left foot ([place])   . Sepsis ([place])   . Advanced care planning/counseling discussion   . Goals of care, counseling/discussion   . Palliative care by specialist   . Severe sepsis ([place]) 08/29/2019  . Gangrenous toe ([place]) 08/28/2019  . Hypotension 08/28/2019  . PVD (peripheral vascular disease) ([place]) 08/28/2019  . Hx of BKA, right ([place]) 08/28/2019  . Anemia in CKD (chronic kidney disease) 06/26/2019  . 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 Dialysis AV fistula malfunction ([place]) 06/26/2019  . Nephrotic range proteinuria 06/26/2019  . Encounter for orthopedic follow-up care 05/20/2019  . Atherosclerosis of native arteries of the extremities with ulceration ([place]) 11/12/2018  . Coronary artery disease involving native coronary artery of native heart without angina pectoris 08/18/2018  . Hyperlipidemia LDL goal <70 08/18/2018  . Preprocedural cardiovascular examination 08/18/2018  . MRSA bacteremia   . Infection of AV graft for dialysis ([place])   . Bacteremia 02/16/2018  . 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 Chest pain, mid sternal 01/03/2018  .  Acute on chronic respiratory failure with hypoxia ([place]) 12/24/2017  . Pain in right hand 08/25/2017  . Elevated troponin 08/05/2017  . 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 Complication from renal dialysis device 02/20/2017  . ESRD (end stage renal disease) ([place]) 02/12/2017  . Altered mental status 02/02/2017  . Atherosclerosis of native arteries of extremity with intermittent claudication ([place]) 12/20/2016  . Ulcer of amputation stump of lower extremity ([place]) 12/20/2016  . Diabetes ([place]) 12/20/2016  . Hypertension 12/20/2016  . Pure hypercholesterolemia 12/20/2016  . Bleeding 07/29/2016  . Panic attack 01/21/2016  . Chronic systolic heart failure ([place]) 10/27/2015  . Type 2 diabetes mellitus with diabetic nephropathy, with long-term current use of insulin ([place]) 04/06/2015  . Overweight (BMI 25.0-29.9) 12/04/2014  . Vitamin D deficiency 03/17/2014  . TIA (transient ischemic attack) 01/04/2014  . Vision abnormalities 01/03/2014  . Chronic hepatitis C virus infection ([place]) 11/01/2013  . Arteriovenous fistula ([place]) 08/27/2013  . CHF exacerbation ([place]) 12/12/2012  . Constipation 02/01/2012  . Ischemic cardiomyopathy 11/17/2010  . Retinopathy, diabetic, background ([place]) 11/11/2008  . Carotid artery stenosis 11/03/2008  . Cerebral infarct ([place]) 10/31/2008  . 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 Amputation of leg ([place]) 02/02/2004  . Poliomyelitis 07/04/1954   PCP:  Juluis Pitch, MD Pharmacy:   [place], [place] - [address] [place] [address] Phone: [phone] Fax: [phone]     Social Determinants of Health (SDOH) Interventions    Readmission Risk Interventions No flowsheet data found.

## 2019-09-01 NOTE — Progress Notes (Addendum)
Pharmacy Antibiotic Note  Dennis Walker is a 69 y.o. male admitted on 08/28/2019 with sepsis secondary to left toe infection/gangrene. 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 Patient with past medical history significant for CAD, CHF, Stroke, Right BKA, Diabetes, ESRD requiring dialysis Tuesday/Thursday/Saturday. Coagulase negative staph growing in 1/4 blood cultures. Patient having angiogram today, 2/26, prior to proceeding to amputation.   Pharmacy has been consulted for Vancomycin dosing.  Plan: Patient s/p AKA 2/27, did not receive dialysis but was still given vancomycin dose. Per nephrology, current plan to dialyze patient Monday. Will order vanc random level prior to HD to assess. Continue to follow HD plan and ID recommendations for vanc.  Zosyn EI 3.375g IV Q12hr.   ID Consult. Continue vanc/Zosyn for now.   Height: 5\' 11"  (180.3 cm) Weight: 166 lb 14.2 oz (75.7 kg) IBW/kg (Calculated) : 75.3  Temp (24hrs), Avg:98.1 F (36.7 C), Min:97.6 F (36.4 C), Max:98.6 F (37 C)  Recent Labs  Lab 08/28/19 1746 08/28/19 1746 08/28/19 2137 08/29/19 0518 08/30/19 0511 08/31/19 0437 08/31/19 0830 09/01/19 0418  WBC 17.6*   < >  --  21.8* 20.3* 22.1* 22.3* 19.8*  CREATININE 8.08*  --   --  8.66* 6.42* 7.86*  --  9.04*  LATICACIDVEN 0.8  --  0.8  --   --   --   --   --   VANCORANDOM  --   --   --  28  --   --   --   --    < > = values in this interval not displayed.    Estimated Creatinine Clearance: 8.4 mL/min (A) (by C-G formula based on SCr of 9.04 mg/dL (H)).    Allergies  Allergen Reactions  . Cefuroxime Itching  . Shrimp [Shellfish Allergy] Other (See Comments)    Per mar. Unknown reaction   . Sulfa Antibiotics Itching and Rash    Antimicrobials this admission: Cefepime 2/24 x 1.  Metronidazole 2/24 x 1 Vancomycin 2/24 >>  Zosyn 2/25 >>  Dose adjustments this admission: N/A  Microbiology results: 2/24 BCx: coag negative staph 1/4 2/26  BCx: no growth 2/25 MRSA PCR: negative  2/25 SARS Coronavirus: negative  2/25 Influenza A/B: negative   Thank you for allowing pharmacy to be a part of this patient's care.  Tawnya Crook, PharmD 09/01/2019 2:25 PM

## 2019-09-01 NOTE — Plan of Care (Signed)
Continuing with plan of care. 

## 2019-09-01 NOTE — Progress Notes (Signed)
Dennis Walker  MRN: [id number]  DOB/AGE: 68-Nov-1952 68 y.o.  Primary Care Physician:Bronstein, Shanon Brow, MD  Admit date: 08/28/2019  Chief Complaint:  Chief Complaint  Patient presents with  . Wound Infection    S-Pt presented on  08/28/2019 with  Chief Complaint  Patient presents with  . Wound Infection  . Patient offers no specific complaints.     Medications . atorvastatin  20 mg Oral q1800  . Chlorhexidine Gluconate Cloth  6 each Topical Daily  . cholecalciferol  1,000 Units Oral q1800  . ferrous sulfate  325 mg Oral q1800  . gabapentin  100 mg Oral QPM  . heparin injection (subcutaneous)  5,000 Units Subcutaneous Q8H  . insulin aspart  0-15 Units Subcutaneous Q4H  . mouth rinse  15 mL Mouth Rinse BID  . multivitamin with minerals  1 tablet Oral QHS  . polyvinyl alcohol  1 drop Left Eye QID  . sertraline  100 mg Oral QHS  . sodium chloride flush  10-40 mL Intracatheter Q12H         [id number] from the symptoms mentioned above,there are no other symptoms referable to all systems reviewed.  Physical Exam: Vital signs in last 24 hours: Temp:  [97.6 F (36.4 C)-98.3 F (36.8 C)] 98.3 F (36.8 C) (02/28 0600) Pulse Rate:  [69] 69 (02/27 1533) Resp:  [0-23] 0 (02/28 0600) BP: (93-143)/(12-82) 106/28 (02/28 0600) SpO2:  [97 %-100 %] 100 % (02/27 1533) Weight:  [75.7 kg] 75.7 kg (02/28 0600) Weight change:  Last BM Date: 08/30/19  Intake/Output from previous day: 02/27 0701 - 02/28 0700 In: 645.6 [I.V.:305.1; IV Piggyback:340.5] Out: 200 [Blood:200] No intake/output data recorded.   Physical Exam: General- pt is awake,alert, oriented to time place and person Resp- No acute REsp distress, CTA B/L NO Rhonchi CVS- S1S2 regular in rate and rhythm GIT- BS+, soft, NT, ND EXT- NO LE Edema, Cyanosis Right BKA Left AKA Access left AV fistula  Lab Results: CBC Recent Labs    08/31/19 0830 09/01/19 0418  WBC 22.3* 19.8*  HGB 11.0* 9.8*  HCT 34.3* 31.4*   PLT 292 297    BMET Recent Labs    08/31/19 0437 09/01/19 0418  NA 139 137  K 4.0 4.4  CL 98 97*  CO2 27 25  GLUCOSE 190* 174*  BUN 46* 64*  CREATININE 7.86* 9.04*  CALCIUM 8.1* 8.2*    MICRO Recent Results (from the past 240 hour(s))  Blood Culture (routine x 2)     Status: Abnormal   Collection Time: 08/28/19  5:46 PM   Specimen: BLOOD  Result Value Ref Range Status   Specimen Description BLOOD RESIDENT  Final   Special Requests   Final    BOTTLES DRAWN AEROBIC AND ANAEROBIC Blood Culture results may not be optimal due to an inadequate volume of blood received in culture bottles   Culture  Setup Time   Final    GRAM POSITIVE COCCI AEROBIC BOTTLE ONLY CRITICAL RESULT CALLED TO, READ BACK BY AND VERIFIED WITH: JASON ROBBINS 08/29/19 @ 1803  [place] Performed at [facility], [address] [place], [place] [address]    Culture (A)  Final    STAPHYLOCOCCUS SPECIES (COAGULASE NEGATIVE) THE SIGNIFICANCE OF ISOLATING THIS ORGANISM FROM A SINGLE SET OF BLOOD CULTURES WHEN MULTIPLE SETS ARE DRAWN IS UNCERTAIN. PLEASE NOTIFY THE MICROBIOLOGY DEPARTMENT WITHIN ONE WEEK IF SPECIATION AND SENSITIVITIES ARE REQUIRED.    Report Status 08/31/2019 FINAL  Final  Respiratory Panel by  RT PCR (Flu A&B, Covid) - Nasopharyngeal Swab     Status: None   Collection Time: 08/28/19  7:32 PM   Specimen: Nasopharyngeal Swab  Result Value Ref Range Status   SARS Coronavirus 2 by RT PCR NEGATIVE NEGATIVE Final    Comment: (NOTE) SARS-CoV-2 target nucleic acids are NOT DETECTED. The SARS-CoV-2 RNA is generally detectable in upper respiratoy specimens during the acute phase of infection. The lowest concentration of SARS-CoV-2 viral copies this assay can detect is 131 copies/mL. A negative result does not preclude SARS-Cov-2 infection and should not be used as the sole basis for treatment or other patient management decisions. A negative result may occur with  improper specimen  collection/handling, submission of specimen other than nasopharyngeal swab, presence of viral mutation(s) within the areas targeted by this assay, and inadequate number of viral copies (<131 copies/mL). A negative result must be combined with clinical observations, patient history, and epidemiological information. The expected result is Negative. Fact Sheet for Patients:  PinkCheek.be Fact Sheet for Healthcare Providers:  GravelBags.it This test is not yet ap proved or cleared by the [place] FDA and  has been authorized for detection and/or diagnosis of SARS-CoV-2 by FDA under an Emergency Use Authorization (EUA). This EUA will remain  in effect (meaning this test can be used) for the duration of the COVID-19 declaration under Section 564(b)(1) of the Act, 21 U.S.C. section 360bbb-3(b)(1), unless the authorization is terminated or revoked sooner.    Influenza A by PCR NEGATIVE NEGATIVE Final   Influenza B by PCR NEGATIVE NEGATIVE Final    Comment: (NOTE) The Xpert Xpress SARS-CoV-2/FLU/RSV assay is intended as an aid in  the diagnosis of influenza from Nasopharyngeal swab specimens and  should not be used as a sole basis for treatment. Nasal washings and  aspirates are unacceptable for Xpert Xpress SARS-CoV-2/FLU/RSV  testing. Fact Sheet for Patients: PinkCheek.be Fact Sheet for Healthcare Providers: GravelBags.it This test is not yet approved or cleared by the [place] FDA and  has been authorized for detection and/or diagnosis of SARS-CoV-2 by  FDA under an Emergency Use Authorization (EUA). This EUA will remain  in effect (meaning this test can be used) for the duration of the  Covid-19 declaration under Section 564(b)(1) of the Act, 21  U.S.C. section 360bbb-3(b)(1), unless the authorization is  terminated or revoked. Performed at [facility],  [address] [place], [place] [address]   MRSA PCR Screening     Status: None   Collection Time: 08/29/19 12:16 AM   Specimen: Foot, Left; Nasopharyngeal  Result Value Ref Range Status   MRSA by PCR NEGATIVE NEGATIVE Final    Comment:        The GeneXpert MRSA Assay (FDA approved for NASAL specimens only), is one component of a comprehensive MRSA colonization surveillance program. It is not intended to diagnose MRSA infection nor to guide or monitor treatment for MRSA infections. Performed at [facility], [address] [place], [place] [address]   Blood Culture (routine x 2)     Status: None (Preliminary result)   Collection Time: 08/29/19  1:31 AM   Specimen: Leg; Blood  Result Value Ref Range Status   Specimen Description LEG  Final   Special Requests Blood Culture adequate volume  Final   Culture   Final    NO GROWTH 3 DAYS Performed at [facility], [address], [place],  [address]    Report Status PENDING  Incomplete  Culture,  blood (single) w Reflex to ID Panel     Status: None (Preliminary result)   Collection Time: 08/30/19  6:12 PM   Specimen: BLOOD  Result Value Ref Range Status   Specimen Description BLOOD RAC  Final   Special Requests   Final    BOTTLES DRAWN AEROBIC AND ANAEROBIC Blood Culture adequate volume   Culture   Final    NO GROWTH 2 DAYS Performed at [facility], [address], [place], [place] [address]    Report Status PENDING  Incomplete  Culture, blood (single) w Reflex to ID Panel     Status: None (Preliminary result)   Collection Time: 08/30/19 10:27 PM   Specimen: BLOOD  Result Value Ref Range Status   Specimen Description BLOOD RIGHT ANTECUBITAL  Final   Special Requests   Final    BOTTLES DRAWN AEROBIC AND ANAEROBIC Blood Culture adequate volume   Culture   Final    NO GROWTH 2 DAYS Performed at [facility], [address] [place], [place] [address]    Report Status  PENDING  Incomplete      Lab Results  Component Value Date   CALCIUM 8.2 (L) 09/01/2019   CAION 1.01 (L) 08/07/2019   PHOS 3.2 08/30/2019               Impression:  Mr. SHONN WITTEMAN is a 69 year old African-American male with end stage renal disease on hemodialysis, hypertension, childhood polio, hyperlipidemia, CVA with left sided residual weakness, peripheral vascular disease, coronary artery disease, diabetes mellitus type II, diabetic neuropathy, congestive heart failure, asthma.    1)Renal ESRD Patient is on hemodialysis Patient is on Tuesday Thursday Saturday schedule. Patient was not dialyzed yesterday as patient had to undergo procedures No acute indication for dialysis today-No hyperkalemia no fluid overload no acidosis We'll dialyze in a.m.   2) hypotension Patient was admitted with septic shock Patient required vasopressors earlier. Patient is now better  3)Anemia of chronic disease  HGb at goal (9--11)   4) secondary hyperparathyroidism -CKD Mineral-Bone Disorder   Secondary Hyperparathyroidism  Present. Patient has intact PTH levels of 662 on August 12, 2019.  Phosphorus at goal.   5) peripheral vascular disease with gangrene Patient now s/p AKA  6) electrolytes   sodium Normonatremic   potassium Normokalemic    7)Acid base Co2 at goal     Plan:  No acute need for dialysis today We will dialyze patient in a.m.    Kasiyah Platter s Theador Hawthorne 09/01/2019, 9:46 AM

## 2019-09-02 ENCOUNTER — Encounter: Payer: Self-pay | Admitting: Cardiology

## 2019-09-02 ENCOUNTER — Encounter
Admission: EM | Disposition: A | Discharge status: Skilled Nursing Facility | Payer: Self-pay | Source: Home / Self Care | Attending: Internal Medicine

## 2019-09-02 ENCOUNTER — Encounter: Payer: Self-pay | Admitting: Anesthesiology

## 2019-09-02 LAB — CBC WITH DIFFERENTIAL/PLATELET
Abs Immature Granulocytes: 0.11 10*3/uL — ABNORMAL HIGH (ref 0.00–0.07)
Basophils Absolute: 0 10*3/uL (ref 0.0–0.1)
Basophils Relative: 0 %
Eosinophils Absolute: 0 10*3/uL (ref 0.0–0.5)
Eosinophils Relative: 0 %
HCT: 28.9 % — ABNORMAL LOW (ref 39.0–52.0)
Hemoglobin: 9.6 g/dL — ABNORMAL LOW (ref 13.0–17.0)
Immature Granulocytes: 1 %
Lymphocytes Relative: 10 %
Lymphs Abs: 1.4 10*3/uL (ref 0.7–4.0)
MCH: 27 pg (ref 26.0–34.0)
MCHC: 33.2 g/dL (ref 30.0–36.0)
MCV: 81.4 fL (ref 80.0–100.0)
Monocytes Absolute: 1.2 10*3/uL — ABNORMAL HIGH (ref 0.1–1.0)
Monocytes Relative: 8 %
Neutro Abs: 11.6 10*3/uL — ABNORMAL HIGH (ref 1.7–7.7)
Neutrophils Relative %: 81 %
Platelets: 278 10*3/uL (ref 150–400)
RBC: 3.55 MIL/uL — ABNORMAL LOW (ref 4.22–5.81)
RDW: 17.8 % — ABNORMAL HIGH (ref 11.5–15.5)
WBC: 14.4 10*3/uL — ABNORMAL HIGH (ref 4.0–10.5)
nRBC: 0.6 % — ABNORMAL HIGH (ref 0.0–0.2)

## 2019-09-02 LAB — GLUCOSE, CAPILLARY
Glucose-Capillary: 105 mg/dL — ABNORMAL HIGH (ref 70–99)
Glucose-Capillary: 106 mg/dL — ABNORMAL HIGH (ref 70–99)
Glucose-Capillary: 90 mg/dL (ref 70–99)
Glucose-Capillary: 88 mg/dL (ref 70–99)
Glucose-Capillary: 102 mg/dL — ABNORMAL HIGH (ref 70–99)

## 2019-09-02 LAB — BASIC METABOLIC PANEL
Anion gap: 19 — ABNORMAL HIGH (ref 5–15)
BUN: 81 mg/dL — ABNORMAL HIGH (ref 8–23)
CO2: 24 mmol/L (ref 22–32)
Calcium: 7.6 mg/dL — ABNORMAL LOW (ref 8.9–10.3)
Chloride: 97 mmol/L — ABNORMAL LOW (ref 98–111)
Creatinine, Ser: 9.82 mg/dL — ABNORMAL HIGH (ref 0.61–1.24)
GFR calc Af Amer: 6 mL/min — ABNORMAL LOW (ref >60)
GFR calc non Af Amer: 5 mL/min — ABNORMAL LOW (ref >60)
Glucose, Bld: 126 mg/dL — ABNORMAL HIGH (ref 70–99)
Potassium: 4.3 mmol/L (ref 3.5–5.1)
Sodium: 140 mmol/L (ref 135–145)

## 2019-09-02 LAB — VANCOMYCIN, RANDOM: Vancomycin Rm: 33

## 2019-09-02 SURGERY — AMPUTATION BELOW KNEE
Anesthesia: Choice | Site: Knee | Laterality: Left

## 2019-09-02 MED ORDER — HYDROMORPHONE HCL 1 MG/ML IJ SOLN: 1.0000 mg | INTRAMUSCULAR | Status: DC | PRN

## 2019-09-02 MED ORDER — EPOETIN ALFA 10000 UNIT/ML IJ SOLN
4000.0000 [IU] | INTRAMUSCULAR | Status: DC
  Administered 2019-09-02 – 2019-09-05 (×3): 4000 [IU] via INTRAVENOUS

## 2019-09-02 MED ORDER — OXYCODONE-ACETAMINOPHEN 5-325 MG PO TABS
2.0000 | ORAL_TABLET | ORAL | Status: DC | PRN
  Administered 2019-09-04 – 2019-09-05 (×3): 2 via ORAL
  Filled 2019-09-02 (×3): qty 2

## 2019-09-02 MED ORDER — HYDROMORPHONE HCL 1 MG/ML IJ SOLN
0.5000 mg | INTRAMUSCULAR | Status: DC | PRN
  Administered 2019-09-02: 0.5 mg via INTRAVENOUS
  Filled 2019-09-02: qty 0.5

## 2019-09-02 MED ORDER — HYDROMORPHONE HCL 1 MG/ML IJ SOLN
1.0000 mg | INTRAMUSCULAR | Status: DC | PRN
  Administered 2019-09-02 – 2019-09-05 (×4): 1 mg via INTRAVENOUS
  Filled 2019-09-02 (×4): qty 1

## 2019-09-02 MED ORDER — OXYCODONE-ACETAMINOPHEN 5-325 MG PO TABS
1.0000 | ORAL_TABLET | ORAL | Status: DC | PRN
  Administered 2019-09-02: 1 via ORAL
  Filled 2019-09-02: qty 1

## 2019-09-02 MED ORDER — VANCOMYCIN HCL 500 MG/100ML IV SOLN
500.0000 mg | Freq: Once | INTRAVENOUS | Status: AC
  Administered 2019-09-02: 500 mg via INTRAVENOUS
  Filled 2019-09-02: qty 100

## 2019-09-02 MED ORDER — BLISTEX MEDICATED EX OINT
TOPICAL_OINTMENT | CUTANEOUS | Status: DC | PRN
  Administered 2019-09-02: 1 via TOPICAL
  Filled 2019-09-02: qty 6.3

## 2019-09-02 NOTE — Progress Notes (Signed)
[place] INFECTIOUS DISEASE PROGRESS NOTE Date of Admission:  08/28/2019     ID: Dennis Walker is a 69 y.o. male with sepsis, gangrene Principal Problem:   Gangrenous toe ([place]) Active Problems:   Hypertension   ESRD (end stage renal disease) ([place])   Chronic systolic heart failure (HCC)   Hypotension   PVD (peripheral vascular disease) (HCC)   Hx of BKA, right (HCC)   Severe sepsis ([place])   Gangrene of left foot ([place])   Sepsis ([place])   Advanced care planning/counseling discussion   Goals of care, counseling/discussion   Palliative care by specialist   Subjective: S/p AKA on 2/27. No fevers, wbc down to 14. FU bcx 2/26 NGTD. He has a groin TLC  and a AV fistula  ROS  Eleven systems are reviewed and negative except per hpi  Medications:  Antibiotics Given (last 72 hours)    Date/Time Action Medication Dose Rate   08/30/19 2326 New Bag/Given   piperacillin-tazobactam (ZOSYN) IVPB 3.375 g 3.375 g 12.5 mL/hr   08/31/19 0820 New Bag/Given   piperacillin-tazobactam (ZOSYN) IVPB 3.375 g 3.375 g 12.5 mL/hr   08/31/19 1156 New Bag/Given   vancomycin (VANCOCIN) IVPB 1000 mg/200 mL premix 1,000 mg 200 mL/hr   08/31/19 2144 New Bag/Given   piperacillin-tazobactam (ZOSYN) IVPB 3.375 g 3.375 g 12.5 mL/hr   09/01/19 1143 New Bag/Given   piperacillin-tazobactam (ZOSYN) IVPB 3.375 g 3.375 g 12.5 mL/hr   09/01/19 2202 New Bag/Given   piperacillin-tazobactam (ZOSYN) IVPB 3.375 g 3.375 g 12.5 mL/hr   09/02/19 0856 New Bag/Given   piperacillin-tazobactam (ZOSYN) IVPB 3.375 g 3.375 g 12.5 mL/hr     . atorvastatin  20 mg Oral q1800  . Chlorhexidine Gluconate Cloth  6 each Topical Daily  . cholecalciferol  1,000 Units Oral q1800  . epoetin (EPOGEN/PROCRIT) injection  4,000 Units Intravenous Q T,Th,Sa-HD  . ferrous sulfate  325 mg Oral q1800  . gabapentin  100 mg Oral QPM  . heparin injection (subcutaneous)  5,000 Units Subcutaneous Q8H  . insulin aspart  0-15 Units Subcutaneous Q4H  .  mouth rinse  15 mL Mouth Rinse BID  . multivitamin with minerals  1 tablet Oral QHS  . polyvinyl alcohol  1 drop Left Eye QID  . sertraline  100 mg Oral QHS  . sodium chloride flush  10-40 mL Intracatheter Q12H  . vancomycin variable dose per unstable renal function (pharmacist dosing)   Does not apply See admin instructions    Objective: Vital signs in last 24 hours: Temp:  [97.1 F (36.2 C)-98.3 F (36.8 C)] 97.8 F (36.6 C) (03/01 1459) Pulse Rate:  [71-86] 74 (03/01 1459) Resp:  [9-29] 19 (03/01 1459) BP: (77-160)/(31-149) 149/88 (03/01 1459) SpO2:  [92 %-99 %] 94 % (03/01 1459) Weight:  [74.1 kg] 74.1 kg (03/01 1040) .Physical Exam  Constitutional: He is confused, appears older than stated age, chronically ill apperain HENT: anicteric  Mouth/Throat: Oropharynx is clear and moist. No oropharyngeal exudate.  Cardiovascular: Normal rate, regular rhythm and normal heart sounds.Pulmonary/Chest: Effort normal and breath sounds normal. No respiratory distress. He has no wheezes.  Abdominal: Soft. Bowel sounds are normal. He exhibits no distension. There is no tenderness.  RUE with AVF, LUE with old AVF R groin with TLC in place  Lymphadenopathy: He has no cervical adenopathy.  Neurological: He is asomewhat cofused today Skin: Skin is warm and dry. No rash noted. No erythema.  Psychiatric: He has a normal mood and affect. His behavior  is normal.   Lab Results Recent Labs    09/01/19 0418 09/02/19 0551  WBC 19.8* 14.4*  HGB 9.8* 9.6*  HCT 31.4* 28.9*  NA 137 140  K 4.4 4.3  CL 97* 97*  CO2 25 24  BUN 64* 81*  CREATININE 9.04* 9.82*    Microbiology: Results for orders placed or performed during the hospital encounter of 08/28/19  Blood Culture (routine x 2)     Status: Abnormal   Collection Time: 08/28/19  5:46 PM   Specimen: BLOOD  Result Value Ref Range Status   Specimen Description BLOOD RESIDENT  Final   Special Requests   Final    BOTTLES DRAWN AEROBIC AND  ANAEROBIC Blood Culture results may not be optimal due to an inadequate volume of blood received in culture bottles   Culture  Setup Time   Final    GRAM POSITIVE COCCI AEROBIC BOTTLE ONLY CRITICAL RESULT CALLED TO, READ BACK BY AND VERIFIED WITH: JASON ROBBINS 08/29/19 @ 1803  [place] Performed at [facility], [address] [place], [place] [address]    Culture (A)  Final    STAPHYLOCOCCUS SPECIES (COAGULASE NEGATIVE) THE SIGNIFICANCE OF ISOLATING THIS ORGANISM FROM A SINGLE SET OF BLOOD CULTURES WHEN MULTIPLE SETS ARE DRAWN IS UNCERTAIN. PLEASE NOTIFY THE MICROBIOLOGY DEPARTMENT WITHIN ONE WEEK IF SPECIATION AND SENSITIVITIES ARE REQUIRED.    Report Status 08/31/2019 FINAL  Final  Respiratory Panel by RT PCR (Flu A&B, Covid) - Nasopharyngeal Swab     Status: None   Collection Time: 08/28/19  7:32 PM   Specimen: Nasopharyngeal Swab  Result Value Ref Range Status   SARS Coronavirus 2 by RT PCR NEGATIVE NEGATIVE Final    Comment: (NOTE) SARS-CoV-2 target nucleic acids are NOT DETECTED. The SARS-CoV-2 RNA is generally detectable in upper respiratoy specimens during the acute phase of infection. The lowest concentration of SARS-CoV-2 viral copies this assay can detect is 131 copies/mL. A negative result does not preclude SARS-Cov-2 infection and should not be used as the sole basis for treatment or other patient management decisions. A negative result may occur with  improper specimen collection/handling, submission of specimen other than nasopharyngeal swab, presence of viral mutation(s) within the areas targeted by this assay, and inadequate number of viral copies (<131 copies/mL). A negative result must be combined with clinical observations, patient history, and epidemiological information. The expected result is Negative. Fact Sheet for Patients:  PinkCheek.be Fact Sheet for Healthcare Providers:   GravelBags.it This test is not yet ap proved or cleared by the [place] FDA and  has been authorized for detection and/or diagnosis of SARS-CoV-2 by FDA under an Emergency Use Authorization (EUA). This EUA will remain  in effect (meaning this test can be used) for the duration of the COVID-19 declaration under Section 564(b)(1) of the Act, 21 U.S.C. section 360bbb-3(b)(1), unless the authorization is terminated or revoked sooner.    Influenza A by PCR NEGATIVE NEGATIVE Final   Influenza B by PCR NEGATIVE NEGATIVE Final    Comment: (NOTE) The Xpert Xpress SARS-CoV-2/FLU/RSV assay is intended as an aid in  the diagnosis of influenza from Nasopharyngeal swab specimens and  should not be used as a sole basis for treatment. Nasal washings and  aspirates are unacceptable for Xpert Xpress SARS-CoV-2/FLU/RSV  testing. Fact Sheet for Patients: PinkCheek.be Fact Sheet for Healthcare Providers: GravelBags.it This test is not yet approved or cleared by the [place] FDA and  has been authorized for detection and/or diagnosis of  SARS-CoV-2 by  FDA under an Emergency Use Authorization (EUA). This EUA will remain  in effect (meaning this test can be used) for the duration of the  Covid-19 declaration under Section 564(b)(1) of the Act, 21  U.S.C. section 360bbb-3(b)(1), unless the authorization is  terminated or revoked. Performed at [facility], [address] [place], [place] [address]   MRSA PCR Screening     Status: None   Collection Time: 08/29/19 12:16 AM   Specimen: Foot, Left; Nasopharyngeal  Result Value Ref Range Status   MRSA by PCR NEGATIVE NEGATIVE Final    Comment:        The GeneXpert MRSA Assay (FDA approved for NASAL specimens only), is one component of a comprehensive MRSA colonization surveillance program. It is not intended to diagnose MRSA infection nor to guide  or monitor treatment for MRSA infections. Performed at [facility], [address] [place], [place] [address]   Blood Culture (routine x 2)     Status: None (Preliminary result)   Collection Time: 08/29/19  1:31 AM   Specimen: Leg; Blood  Result Value Ref Range Status   Specimen Description LEG  Final   Special Requests Blood Culture adequate volume  Final   Culture   Final    NO GROWTH 4 DAYS Performed at [facility], [address], [place], [place] [address]    Report Status PENDING  Incomplete  Culture, blood (single) w Reflex to ID Panel     Status: None (Preliminary result)   Collection Time: 08/30/19  6:12 PM   Specimen: BLOOD  Result Value Ref Range Status   Specimen Description BLOOD RAC  Final   Special Requests   Final    BOTTLES DRAWN AEROBIC AND ANAEROBIC Blood Culture adequate volume   Culture   Final    NO GROWTH 3 DAYS Performed at [facility], [address] [place], [place] [address]    Report Status PENDING  Incomplete  Culture, blood (single) w Reflex to ID Panel     Status: None (Preliminary result)   Collection Time: 08/30/19 10:27 PM   Specimen: BLOOD  Result Value Ref Range Status   Specimen Description BLOOD RIGHT ANTECUBITAL  Final   Special Requests   Final    BOTTLES DRAWN AEROBIC AND ANAEROBIC Blood Culture adequate volume   Culture   Final    NO GROWTH 3 DAYS Performed at [facility], [address] [place],  [address]    Report Status PENDING  Incomplete    Studies/Results: No results found.  Assessment/Plan: Sepsis  Likely secondary gangrenous toe infection- HD stable now.  No fevers and wbc down to 14.  now s/p AKA.   Coag neg staph in blood- could be  a contaminant- but only 1 set was sent. He has numerous stents in AV fistula and also has a TLC in groin  and a new AV graft- so need to r/o a true infection FU BCX neg. Needs TTE.  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 H/o rt BKA  Thank you very much for the consult. Will follow with you.  Leonel Ramsay   09/02/2019, 3:17 PM

## 2019-09-02 NOTE — Care Management Important Message (Signed)
Important Message  Patient Details  Name: Dennis Walker MRN: [id number] Date of Birth: 09/27/1950   Medicare Important Message Given:  Yes     Dannette Barbara 09/02/2019, 11:08 AM

## 2019-09-02 NOTE — Progress Notes (Signed)
Hd started  

## 2019-09-02 NOTE — Progress Notes (Addendum)
PROGRESS NOTE    Dennis Walker  [id number] DOB: May 07, 1951 DOA: 08/28/2019  PCP: Juluis Pitch, MD    LOS - 5   Brief Narrative:  Medical records reviewed and are as summarized below:   Dennis Walker is an 69 y.o. male with extensive past medical history including hx of CAD s/p PCI to the proximal LAD, chronic HFpEF, stroke, PAD s/p right BKA, ESRD on HD Tues/Thurs/Sat, type 2 diabetes, childhood polio, blindness and asthma who presented to the ED from [facility] with concerns of gangrenous left second toe.  The toe had become darker and malodorous over few days prior.  Patient is followed closely outpatient by vascular surgery for severe PVD.  In the ED, febrile T-max 101 F, and hypotensive.  Labs showed leukocytosis of 17.6k and stable anemia.  He was treated with broad-spectrum antibiotics and required admission to ICU for vasopressors.  He was weaned off of pressors the following morning and care was transferred to the hospitalist service.  Subjective 3/1: Patient seen at bedside.  No acute events.  He reports severe pain at the left AKA site this AM.  No fever, chills or other acute complaints.  Has dialysis today.  Assessment & Plan:   Principal Problem:   Gangrenous toe ([place]) Active Problems:   Severe sepsis (HCC)   Hypertension   ESRD (end stage renal disease) (HCC)   Chronic systolic heart failure (HCC)   Hypotension   Hx of BKA, right (HCC)   PVD (peripheral vascular disease) ([place])   Gangrene of left foot (HCC)   Sepsis ([place])   Advanced care planning/counseling discussion   Goals of care, counseling/discussion   Palliative care by specialist   Severe sepsis secondary to gangrenous toe - present on admission as evidenced by fever, hypotension requiring vasopressor (due to ESRD to prevent volume overload) and infected gangrenous toe.  Vitals have stabilized, off pressors.    --monitor vitals closely --vasopressor if needed to maintain MAP>65   Gangrenous toe  -present on admission, left second toe.  Patient is status post right BKA with severe peripheral vascular disease.  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 --Vascular surgery following --Continue Vanco and Zosyn --ID consulted  Acute Encephalopathy, likely septic/infectious due to above.  Present on admission. Resolved.   Coagulase-negative Staph Positive Blood culture - on initial cultures, likely contaminate --repeat blood cultures pending, follow up   Hypertension - with hypotension on admission.  BP meds on hold. Of note - BP can only be checked on his LLE, given right BKA and bilateral upper extremity AV fistulas.   ESRD (end stage renal disease)  --nephrology following for dialysis   Chronic systolic heart failure - currently hypovolemic.   --beta blockers on hold due to hypotension, resume when BP tolerates   Hypotension - present on admission with sepsis. Resolved as above.  Monitor BP closely.   Hx of Right BKA - due to severe PVD.  Residual distal limb appears healthy.   PVD (peripheral vascular disease) - with prior right BKA, this admission had left AKA.   --hold ASA and Plavix until okay to resume per vascular   Anemia of chronic disease -secondary to ESRD.  No evidence of active blood loss.  --Monitor CBC   DVT prophylaxis: heparin   Code Status: Full Code  Family Communication: none at bedside during encounter   Disposition Plan:  Anticipate return to [facility] Tuesday or Wednesday pending stability and clearance by vascular surgery. Coming From SNF Exp  DC Date TBD Barriers as above Medically Stable for Discharge? No    Consultants:  Vascular Surgery Nephrology Infectious disease   Procedures:  None   Antimicrobials:  Vancomycin 2/25 >> TBD Zosyn 2/25 >> TBD Vanc/Cefepime/Flagyl - in ED 2/24   Objective: Vitals:   09/01/19 1500 09/01/19 1622 09/01/19 2103 09/02/19 0341  BP: (!) 129/40 (!) 145/33 (!) 159/31 131/61  Pulse:  77 76 71  Resp: 11 18 18   (!) 22  Temp:  98.3 F (36.8 C) 97.6 F (36.4 C) (!) 97.4 F (36.3 C)  TempSrc:  Oral Oral Oral  SpO2:   99% 92%  Weight:      Height:        Intake/Output Summary (Last 24 hours) at 09/02/2019 0747 Last data filed at 09/02/2019 0101 Gross per 24 hour  Intake 170 ml  Output 0 ml  Net 170 ml   Filed Weights   08/28/19 1739 08/29/19 0528 09/01/19 0600  Weight: 85 kg 80.8 kg 75.7 kg    Examination:  General exam: awake, alert, no acute distress HEENT: moist mucus membranes, hearing grossly normal  Respiratory system: CTAB, no wheezes, rales or rhonchi, normal respiratory effort. Cardiovascular system: normal S1/S2, RRR, no peripheral edema.   Central nervous system: A&O x3. no gross focal neurologic deficits, normal speech Extremities: moves all, right BKA, left AKA with dressing intact Psychiatry: normal mood, congruent affect    Data Reviewed: I have personally reviewed following labs and imaging studies  CBC: Recent Labs  Lab 08/28/19 1746 08/29/19 0518 08/30/19 0511 08/31/19 0437 08/31/19 0830 09/01/19 0418 09/02/19 0551  WBC 17.6*   < > 20.3* 22.1* 22.3* 19.8* 14.4*  NEUTROABS 15.1*  --  17.5* 20.8*  --  18.0* 11.6*  HGB 9.8*   < > 9.9* 10.9* 11.0* 9.8* 9.6*  HCT 32.7*   < > 30.9* 34.5* 34.3* 31.4* 28.9*  MCV 89.6   < > 85.1 85.8 84.5 84.2 81.4  PLT 228   < > 248 292 292 297 278   < > = values in this interval not displayed.   Basic Metabolic Panel: Recent Labs  Lab 08/29/19 0518 08/30/19 0511 08/31/19 0437 09/01/19 0418 09/02/19 0551  NA 137 140 139 137 140  K 3.9 3.4* 4.0 4.4 4.3  CL 97* 99 98 97* 97*  CO2 23 25 27 25 24   GLUCOSE 148* 115* 190* 174* 126*  BUN 45* 31* 46* 64* 81*  CREATININE 8.66* 6.42* 7.86* 9.04* 9.82*  CALCIUM 8.1* 8.3* 8.1* 8.2* 7.6*  MG  --  2.1  --   --   --   PHOS  --  3.2  --   --   --    GFR: Estimated Creatinine Clearance: 7.8 mL/min (A) (by C-G formula based on SCr of 9.82 mg/dL (H)). Liver Function Tests: Recent  Labs  Lab 08/28/19 1746  AST 34  ALT 18  ALKPHOS 168*  BILITOT 1.6*  PROT 6.7  ALBUMIN 2.7*   No results for input(s): LIPASE, AMYLASE in the last 168 hours. No results for input(s): AMMONIA in the last 168 hours. Coagulation Profile: No results for input(s): INR, PROTIME in the last 168 hours. Cardiac Enzymes: No results for input(s): CKTOTAL, CKMB, CKMBINDEX, TROPONINI in the last 168 hours. BNP (last 3 results) No results for input(s): PROBNP in the last 8760 hours. HbA1C: No results for input(s): HGBA1C in the last 72 hours. CBG: Recent Labs  Lab 09/01/19 1136 09/01/19 1730 09/01/19 1933 09/01/19 2348  09/02/19 0334  GLUCAP 148* 133* 132* 109* 105*   Lipid Profile: No results for input(s): CHOL, HDL, LDLCALC, TRIG, CHOLHDL, LDLDIRECT in the last 72 hours. Thyroid Function Tests: No results for input(s): TSH, T4TOTAL, FREET4, T3FREE, THYROIDAB in the last 72 hours. Anemia Panel: No results for input(s): VITAMINB12, FOLATE, FERRITIN, TIBC, IRON, RETICCTPCT in the last 72 hours. Sepsis Labs: Recent Labs  Lab 08/28/19 1746 08/28/19 2137  PROCALCITON 2.05  --   LATICACIDVEN 0.8 0.8    Recent Results (from the past 240 hour(s))  Blood Culture (routine x 2)     Status: Abnormal   Collection Time: 08/28/19  5:46 PM   Specimen: BLOOD  Result Value Ref Range Status   Specimen Description BLOOD RESIDENT  Final   Special Requests   Final    BOTTLES DRAWN AEROBIC AND ANAEROBIC Blood Culture results may not be optimal due to an inadequate volume of blood received in culture bottles   Culture  Setup Time   Final    GRAM POSITIVE COCCI AEROBIC BOTTLE ONLY CRITICAL RESULT CALLED TO, READ BACK BY AND VERIFIED WITH: JASON ROBBINS 08/29/19 @ 1803  [place] Performed at [facility], [address] [place], [place] [address]    Culture (A)  Final    STAPHYLOCOCCUS SPECIES (COAGULASE NEGATIVE) THE SIGNIFICANCE OF ISOLATING THIS ORGANISM FROM A SINGLE SET OF BLOOD  CULTURES WHEN MULTIPLE SETS ARE DRAWN IS UNCERTAIN. PLEASE NOTIFY THE MICROBIOLOGY DEPARTMENT WITHIN ONE WEEK IF SPECIATION AND SENSITIVITIES ARE REQUIRED.    Report Status 08/31/2019 FINAL  Final  Respiratory Panel by RT PCR (Flu A&B, Covid) - Nasopharyngeal Swab     Status: None   Collection Time: 08/28/19  7:32 PM   Specimen: Nasopharyngeal Swab  Result Value Ref Range Status   SARS Coronavirus 2 by RT PCR NEGATIVE NEGATIVE Final    Comment: (NOTE) SARS-CoV-2 target nucleic acids are NOT DETECTED. The SARS-CoV-2 RNA is generally detectable in upper respiratoy specimens during the acute phase of infection. The lowest concentration of SARS-CoV-2 viral copies this assay can detect is 131 copies/mL. A negative result does not preclude SARS-Cov-2 infection and should not be used as the sole basis for treatment or other patient management decisions. A negative result may occur with  improper specimen collection/handling, submission of specimen other than nasopharyngeal swab, presence of viral mutation(s) within the areas targeted by this assay, and inadequate number of viral copies (<131 copies/mL). A negative result must be combined with clinical observations, patient history, and epidemiological information. The expected result is Negative. Fact Sheet for Patients:  PinkCheek.be Fact Sheet for Healthcare Providers:  GravelBags.it This test is not yet ap proved or cleared by the [place] FDA and  has been authorized for detection and/or diagnosis of SARS-CoV-2 by FDA under an Emergency Use Authorization (EUA). This EUA will remain  in effect (meaning this test can be used) for the duration of the COVID-19 declaration under Section 564(b)(1) of the Act, 21 U.S.C. section 360bbb-3(b)(1), unless the authorization is terminated or revoked sooner.    Influenza A by PCR NEGATIVE NEGATIVE Final   Influenza B by PCR NEGATIVE  NEGATIVE Final    Comment: (NOTE) The Xpert Xpress SARS-CoV-2/FLU/RSV assay is intended as an aid in  the diagnosis of influenza from Nasopharyngeal swab specimens and  should not be used as a sole basis for treatment. Nasal washings and  aspirates are unacceptable for Xpert Xpress SARS-CoV-2/FLU/RSV  testing. Fact Sheet for Patients: PinkCheek.be Fact Sheet for Healthcare  Providers: GravelBags.it This test is not yet approved or cleared by the [place] and  has been authorized for detection and/or diagnosis of SARS-CoV-2 by  FDA under an Emergency Use Authorization (EUA). This EUA will remain  in effect (meaning this test can be used) for the duration of the  Covid-19 declaration under Section 564(b)(1) of the Act, 21  U.S.C. section 360bbb-3(b)(1), unless the authorization is  terminated or revoked. Performed at [facility], [address] [place], [place] [address]   MRSA PCR Screening     Status: None   Collection Time: 08/29/19 12:16 AM   Specimen: Foot, Left; Nasopharyngeal  Result Value Ref Range Status   MRSA by PCR NEGATIVE NEGATIVE Final    Comment:        The GeneXpert MRSA Assay (FDA approved for NASAL specimens only), is one component of a comprehensive MRSA colonization surveillance program. It is not intended to diagnose MRSA infection nor to guide or monitor treatment for MRSA infections. Performed at [facility], [address] [place], [place] [address]   Blood Culture (routine x 2)     Status: None (Preliminary result)   Collection Time: 08/29/19  1:31 AM   Specimen: Leg; Blood  Result Value Ref Range Status   Specimen Description LEG  Final   Special Requests Blood Culture adequate volume  Final   Culture   Final    NO GROWTH 3 DAYS Performed at [facility], [address], [place], [place] [address]    Report Status PENDING  Incomplete  Culture,  blood (single) w Reflex to ID Panel     Status: None (Preliminary result)   Collection Time: 08/30/19  6:12 PM   Specimen: BLOOD  Result Value Ref Range Status   Specimen Description BLOOD RAC  Final   Special Requests   Final    BOTTLES DRAWN AEROBIC AND ANAEROBIC Blood Culture adequate volume   Culture   Final    NO GROWTH 2 DAYS Performed at [facility], [address], [place], [place] [address]    Report Status PENDING  Incomplete  Culture, blood (single) w Reflex to ID Panel     Status: None (Preliminary result)   Collection Time: 08/30/19 10:27 PM   Specimen: BLOOD  Result Value Ref Range Status   Specimen Description BLOOD RIGHT ANTECUBITAL  Final   Special Requests   Final    BOTTLES DRAWN AEROBIC AND ANAEROBIC Blood Culture adequate volume   Culture   Final    NO GROWTH 2 DAYS Performed at [facility], [address] [place], [place] [address]    Report Status PENDING  Incomplete         Radiology Studies: No results found.      Scheduled Meds:  atorvastatin  20 mg Oral q1800   Chlorhexidine Gluconate Cloth  6 each Topical Daily   cholecalciferol  1,000 Units Oral q1800   ferrous sulfate  325 mg Oral q1800   gabapentin  100 mg Oral QPM   heparin injection (subcutaneous)  5,000 Units Subcutaneous Q8H   insulin aspart  0-15 Units Subcutaneous Q4H   mouth rinse  15 mL Mouth Rinse BID   multivitamin with minerals  1 tablet Oral QHS   polyvinyl alcohol  1 drop Left Eye QID   sertraline  100 mg Oral QHS   sodium chloride flush  10-40 mL Intracatheter Q12H   vancomycin variable dose per unstable renal function (pharmacist dosing)  Does not apply See admin instructions   Continuous Infusions:  sodium chloride 10 mL/hr at 09/01/19 0600   piperacillin-tazobactam (ZOSYN)  IV 3.375 g (09/01/19 2202)     LOS: 5 days    Time spent: 30 minutes    Ezekiel Slocumb, DO [facility]   If 7PM-7AM, please contact  night-coverage [email or web address] 09/02/2019, 7:47 AM

## 2019-09-02 NOTE — Progress Notes (Signed)
This note also relates to the following rows which could not be included: Resp - Cannot attach notes to unvalidated device data  Decreased uf to 1.2 liters dt low bp

## 2019-09-02 NOTE — Progress Notes (Signed)
This note also relates to the following rows which could not be included: Pulse Rate - Cannot attach notes to unvalidated device data Resp - Cannot attach notes to unvalidated device data BP - Cannot attach notes to unvalidated device data  Hd completed  

## 2019-09-02 NOTE — Progress Notes (Signed)
[facility] Daily Progress Note   Subjective: 08/31/19: Left above-the-knee amputation  Patient without complaint this AM. No issues overnight.  Objective: Vitals:   09/02/19 1100 09/02/19 1115 09/02/19 1130 09/02/19 1200  BP: (!) 152/37 (!) 136/123 (!) 160/149 (!) 77/37  Pulse:  76    Resp: 18 (!) 29 13 13   Temp:      TempSrc:      SpO2:      Weight:      Height:        Intake/Output Summary (Last 24 hours) at 09/02/2019 1216 Last data filed at 09/02/2019 0101 Gross per 24 hour  Intake 0 ml  Output 0 ml  Net 0 ml   Physical Exam: A&Ox3, NAD CV: RRR Pulmonary: CTA Bilaterally Abdomen: Soft, Nontender, Nondistended Vascular:  Left Lower Extremity: OR dressing intact, clean and dry. Thigh soft.    Laboratory: CBC    Component Value Date/Time   WBC 14.4 (H) 09/02/2019 0551   HGB 9.6 (L) 09/02/2019 0551   HCT 28.9 (L) 09/02/2019 0551   PLT 278 09/02/2019 0551   BMET    Component Value Date/Time   NA 140 09/02/2019 0551   K 4.3 09/02/2019 0551   CL 97 (L) 09/02/2019 0551   CO2 24 09/02/2019 0551   GLUCOSE 126 (H) 09/02/2019 0551   BUN 81 (H) 09/02/2019 0551   CREATININE 9.82 (H) 09/02/2019 0551   CALCIUM 7.6 (L) 09/02/2019 0551   GFRNONAA 5 (L) 09/02/2019 0551   GFRAA 6 (L) 09/02/2019 0551   Assessment/Planning: The patient is a 69 year old male who presented with left lower extremity ischemia no s/p Left AKA - POD#2  1) Will plan on OR dressing change Tuesday. 2) Would expect discharge Tuesday / Wed if medically stable 3) Leukocytosis improving  Discussed with Dr. Ellis [facility] PA-C 09/02/2019 12:16 PM

## 2019-09-02 NOTE — Progress Notes (Signed)
This note also relates to the following rows which could not be included: Resp - Cannot attach notes to unvalidated device data BP - Cannot attach notes to unvalidated device data  NS bolus given dt low bp, uf decreased to 1.5 liters. bp checks q 15 min

## 2019-09-02 NOTE — Progress Notes (Signed)
[facility], [place] 09/02/19  Subjective:   LOS: 5 02/28 0701 - 03/01 0700 In: 170 [P.O.:120; IV Piggyback:50] Out: 0   Doing fair Sitting up in the bed trying to eat breakfast, which is hard to do because of hand contractures Spilled coffee all over him this morning Denies any shortness of breath  Objective:  Vital signs in last 24 hours:  Temp:  [97.4 F (36.3 C)-98.6 F (37 C)] 97.4 F (36.3 C) (03/01 0341) Pulse Rate:  [71-77] 71 (03/01 0341) Resp:  [11-22] 22 (03/01 0341) BP: (129-159)/(28-105) 131/61 (03/01 0341) SpO2:  [92 %-100 %] 92 % (03/01 0341)  Weight change:  Filed Weights   08/28/19 1739 08/29/19 0528 09/01/19 0600  Weight: 85 kg 80.8 kg 75.7 kg    Intake/Output:    Intake/Output Summary (Last 24 hours) at 09/02/2019 [id number] Last data filed at 09/02/2019 0101 Gross per 24 hour  Intake 170 ml  Output 0 ml  Net 170 ml     Physical Exam: General:  Lying in the bed, no acute distress  HEENT  moist oral mucous membrane  Pulm/lungs  normal breathing effort on room air, decreased breath sounds at bases  CVS/Heart  no rub  Abdomen:   Soft, nontender  Extremities:  New left AKA, right BKA  Neurologic:  Alert, able to answer questions  Skin:  No acute rashes  Access:  Right arm new AV graft, left arm old AV graft       Basic Metabolic Panel:  Recent Labs  Lab 08/29/19 0518 08/29/19 0518 08/30/19 0511 08/30/19 0511 08/31/19 0437 09/01/19 0418 09/02/19 0551  NA 137  --  140  --  139 137 140  K 3.9  --  3.4*  --  4.0 4.4 4.3  CL 97*  --  99  --  98 97* 97*  CO2 23  --  25  --  27 25 24   GLUCOSE 148*  --  115*  --  190* 174* 126*  BUN 45*  --  31*  --  46* 64* 81*  CREATININE 8.66*  --  6.42*  --  7.86* 9.04* 9.82*  CALCIUM 8.1*   < > 8.3*   < > 8.1* 8.2* 7.6*  MG  --   --  2.1  --   --   --   --   PHOS  --   --  3.2  --   --   --   --    < > = values in this interval not displayed.     CBC: Recent Labs  Lab  08/28/19 1746 08/29/19 0518 08/30/19 0511 08/31/19 0437 08/31/19 0830 09/01/19 0418 09/02/19 0551  WBC 17.6*   < > 20.3* 22.1* 22.3* 19.8* 14.4*  NEUTROABS 15.1*  --  17.5* 20.8*  --  18.0* 11.6*  HGB 9.8*   < > 9.9* 10.9* 11.0* 9.8* 9.6*  HCT 32.7*   < > 30.9* 34.5* 34.3* 31.4* 28.9*  MCV 89.6   < > 85.1 85.8 84.5 84.2 81.4  PLT 228   < > 248 292 292 297 278   < > = values in this interval not displayed.      Lab Results  Component Value Date   HEPBSAG Negative 12/26/2017   HEPBSAB Non Reactive 12/26/2017      Microbiology:  Recent Results (from the past 240 hour(s))  Blood Culture (routine x 2)     Status: Abnormal   Collection Time: 08/28/19  5:46 PM   Specimen: BLOOD  Result Value Ref Range Status   Specimen Description BLOOD RESIDENT  Final   Special Requests   Final    BOTTLES DRAWN AEROBIC AND ANAEROBIC Blood Culture results may not be optimal due to an inadequate volume of blood received in culture bottles   Culture  Setup Time   Final    GRAM POSITIVE COCCI AEROBIC BOTTLE ONLY CRITICAL RESULT CALLED TO, READ BACK BY AND VERIFIED WITH: JASON ROBBINS 08/29/19 @ 1803  [place] Performed at [facility], [address] [place], [place] [address]    Culture (A)  Final    STAPHYLOCOCCUS SPECIES (COAGULASE NEGATIVE) THE SIGNIFICANCE OF ISOLATING THIS ORGANISM FROM A SINGLE SET OF BLOOD CULTURES WHEN MULTIPLE SETS ARE DRAWN IS UNCERTAIN. PLEASE NOTIFY THE MICROBIOLOGY DEPARTMENT WITHIN ONE WEEK IF SPECIATION AND SENSITIVITIES ARE REQUIRED.    Report Status 08/31/2019 FINAL  Final  Respiratory Panel by RT PCR (Flu A&B, Covid) - Nasopharyngeal Swab     Status: None   Collection Time: 08/28/19  7:32 PM   Specimen: Nasopharyngeal Swab  Result Value Ref Range Status   SARS Coronavirus 2 by RT PCR NEGATIVE NEGATIVE Final    Comment: (NOTE) SARS-CoV-2 target nucleic acids are NOT DETECTED. The SARS-CoV-2 RNA is generally detectable in upper respiratoy specimens  during the acute phase of infection. The lowest concentration of SARS-CoV-2 viral copies this assay can detect is 131 copies/mL. A negative result does not preclude SARS-Cov-2 infection and should not be used as the sole basis for treatment or other patient management decisions. A negative result may occur with  improper specimen collection/handling, submission of specimen other than nasopharyngeal swab, presence of viral mutation(s) within the areas targeted by this assay, and inadequate number of viral copies (<131 copies/mL). A negative result must be combined with clinical observations, patient history, and epidemiological information. The expected result is Negative. Fact Sheet for Patients:  PinkCheek.be Fact Sheet for Healthcare Providers:  GravelBags.it This test is not yet ap proved or cleared by the [place] FDA and  has been authorized for detection and/or diagnosis of SARS-CoV-2 by FDA under an Emergency Use Authorization (EUA). This EUA will remain  in effect (meaning this test can be used) for the duration of the COVID-19 declaration under Section 564(b)(1) of the Act, 21 U.S.C. section 360bbb-3(b)(1), unless the authorization is terminated or revoked sooner.    Influenza A by PCR NEGATIVE NEGATIVE Final   Influenza B by PCR NEGATIVE NEGATIVE Final    Comment: (NOTE) The Xpert Xpress SARS-CoV-2/FLU/RSV assay is intended as an aid in  the diagnosis of influenza from Nasopharyngeal swab specimens and  should not be used as a sole basis for treatment. Nasal washings and  aspirates are unacceptable for Xpert Xpress SARS-CoV-2/FLU/RSV  testing. Fact Sheet for Patients: PinkCheek.be Fact Sheet for Healthcare Providers: GravelBags.it This test is not yet approved or cleared by the [place] FDA and  has been authorized for detection and/or diagnosis of  SARS-CoV-2 by  FDA under an Emergency Use Authorization (EUA). This EUA will remain  in effect (meaning this test can be used) for the duration of the  Covid-19 declaration under Section 564(b)(1) of the Act, 21  U.S.C. section 360bbb-3(b)(1), unless the authorization is  terminated or revoked. Performed at [facility], [address] [place], [place] [address]   MRSA PCR Screening     Status: None   Collection Time: 08/29/19 12:16 AM   Specimen: Foot, Left;  Nasopharyngeal  Result Value Ref Range Status   MRSA by PCR NEGATIVE NEGATIVE Final    Comment:        The GeneXpert MRSA Assay (FDA approved for NASAL specimens only), is one component of a comprehensive MRSA colonization surveillance program. It is not intended to diagnose MRSA infection nor to guide or monitor treatment for MRSA infections. Performed at [facility], [address] [place], [place] [address]   Blood Culture (routine x 2)     Status: None (Preliminary result)   Collection Time: 08/29/19  1:31 AM   Specimen: Leg; Blood  Result Value Ref Range Status   Specimen Description LEG  Final   Special Requests Blood Culture adequate volume  Final   Culture   Final    NO GROWTH 4 DAYS Performed at [facility], [address], [place], [place] [address]    Report Status PENDING  Incomplete  Culture, blood (single) w Reflex to ID Panel     Status: None (Preliminary result)   Collection Time: 08/30/19  6:12 PM   Specimen: BLOOD  Result Value Ref Range Status   Specimen Description BLOOD RAC  Final   Special Requests   Final    BOTTLES DRAWN AEROBIC AND ANAEROBIC Blood Culture adequate volume   Culture   Final    NO GROWTH 3 DAYS Performed at [facility], [address], [place], [place] [address]    Report Status PENDING  Incomplete  Culture, blood (single) w Reflex to ID Panel     Status: None (Preliminary result)   Collection Time: 08/30/19 10:27 PM   Specimen:  BLOOD  Result Value Ref Range Status   Specimen Description BLOOD RIGHT ANTECUBITAL  Final   Special Requests   Final    BOTTLES DRAWN AEROBIC AND ANAEROBIC Blood Culture adequate volume   Culture   Final    NO GROWTH 3 DAYS Performed at [facility], [address] [place],  [address]    Report Status PENDING  Incomplete    Coagulation Studies: No results for input(s): LABPROT, INR in the last 72 hours.  Urinalysis: No results for input(s): COLORURINE, LABSPEC, PHURINE, GLUCOSEU, HGBUR, BILIRUBINUR, KETONESUR, PROTEINUR, UROBILINOGEN, NITRITE, LEUKOCYTESUR in the last 72 hours.  Invalid input(s): APPERANCEUR    Imaging: No results found.   Medications:   . sodium chloride 10 mL/hr at 09/01/19 0600  . piperacillin-tazobactam (ZOSYN)  IV 3.375 g (09/02/19 0856)   . atorvastatin  20 mg Oral q1800  . Chlorhexidine Gluconate Cloth  6 each Topical Daily  . cholecalciferol  1,000 Units Oral q1800  . ferrous sulfate  325 mg Oral q1800  . gabapentin  100 mg Oral QPM  . heparin injection (subcutaneous)  5,000 Units Subcutaneous Q8H  . insulin aspart  0-15 Units Subcutaneous Q4H  . mouth rinse  15 mL Mouth Rinse BID  . multivitamin with minerals  1 tablet Oral QHS  . polyvinyl alcohol  1 drop Left Eye QID  . sertraline  100 mg Oral QHS  . sodium chloride flush  10-40 mL Intracatheter Q12H  . vancomycin variable dose per unstable renal function (pharmacist dosing)   Does not apply See admin instructions   acetaminophen, albuterol, bisacodyl, diphenhydrAMINE, lip balm, ondansetron (ZOFRAN) IV, oxyCODONE-acetaminophen, polyethylene glycol, sodium chloride flush  Assessment/ Plan:  69 y.o. male with end-stage renal disease on hemodialysis, hypertension, history of polio in childhood, hyperlipidemia, stroke with left-sided residual weakness, peripheral vascular disease-history of right BKA,  new left AKA, coronary disease status post PCI to proximal LAD, type 2  diabetes, diabetic neuropathy, congestive heart failure, asthma  Principal Problem:   Gangrenous toe (HCC) Active Problems:   Hypertension   ESRD (end stage renal disease) (HCC)   Chronic systolic heart failure (HCC)   Hypotension   PVD (peripheral vascular disease) (HCC)   Hx of BKA, right ([place])   Severe sepsis ([place])   Gangrene of left foot ([place])   Sepsis ([place])   Advanced care planning/counseling discussion   Goals of care, counseling/discussion   Palliative care by specialist  [facility] TTS// [facility]// rt arm AVG  #. ESRD We will continue dialysis on TTS schedule Patient missed his dialysis over the weekend due to scheduling conflict with surgery We will dialyze today Next dialysis expected on Thursday based on his usual schedule Discussed with vascular surgery, okay to use new right arm AV graft Discussed with dialysis nursing staff, may try to cannulate with a 16-gauge needle and 300 blood flow rate  #. Anemia of CKD  Lab Results  Component Value Date   HGB 9.6 (L) 09/02/2019   Low dose EPO with HD  #. SHPTH  No results found for: PTH Lab Results  Component Value Date   PHOS 3.2 08/30/2019   Monitor calcium and phos level during this admission  #. Diabetes type 2 with CKD with neuropathy, peripheral vascular disease Hgb A1c MFr Bld (%)  Date Value  08/29/2019 7.8 (H)  Managed with [facility] as outpatient  #Peripheral vascular disease History of right BKA Gangrene of left foot, nonsalvageable Left AKA on August 29, 2018  Discharge disposition Review of chart suggest patient is [facility]   LOS: [place] 3/1/20219:17 AM  [place], [place]

## 2019-09-02 NOTE — Progress Notes (Signed)
Pharmacy Antibiotic Note  Dennis Walker is a 69 y.o. male admitted on 08/28/2019 with sepsis secondary to left toe infection/gangrene. 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 Patient with past medical history significant for CAD, CHF, Stroke, Right BKA, Diabetes, ESRD requiring dialysis Tuesday/Thursday/Saturday. Coagulase negative staph growing in 1/4 blood cultures. Patient having angiogram today, 2/26, prior to proceeding to amputation.   Pharmacy has been consulted for Vancomycin dosing.  Plan: Patient s/p AKA 2/27, did not receive dialysis on 2/27 but was still given vancomycin dose. Random level drawn today resulted as 33.   Will order Vancomycin 500mg  IV x 1 to be given after dialysis and will follow up with Nephrologist regarding HD plan.  Zosyn EI 3.375g IV Q12hr.   ID Consult. Continue vanc/Zosyn for now.   Height: 5\' 11"  (180.3 cm) Weight: 163 lb 5.8 oz (74.1 kg) IBW/kg (Calculated) : 75.3  Temp (24hrs), Avg:97.7 F (36.5 C), Min:97.1 F (36.2 C), Max:98.3 F (36.8 C)  Recent Labs  Lab 08/28/19 1746 08/28/19 1746 08/28/19 2137 08/29/19 0518 08/29/19 0518 08/30/19 0511 08/31/19 0437 08/31/19 0830 09/01/19 0418 09/02/19 0551 09/02/19 0700  WBC 17.6*   < >  --  21.8*   < > 20.3* 22.1* 22.3* 19.8* 14.4*  --   CREATININE 8.08*   < >  --  8.66*  --  6.42* 7.86*  --  9.04* 9.82*  --   LATICACIDVEN 0.8  --  0.8  --   --   --   --   --   --   --   --   VANCORANDOM  --   --   --  28  --   --   --   --   --   --  33   < > = values in this interval not displayed.    Estimated Creatinine Clearance: 7.7 mL/min (A) (by C-G formula based on SCr of 9.82 mg/dL (H)).    Allergies  Allergen Reactions  . Cefuroxime Itching  . Shrimp [Shellfish Allergy] Other (See Comments)    Per mar. Unknown reaction   . Sulfa Antibiotics Itching and Rash    Antimicrobials this admission: Cefepime 2/24 x 1.  Metronidazole 2/24 x 1 Vancomycin 2/24 >>  Zosyn 2/25  >>  Dose adjustments this admission: N/A  Microbiology results: 2/24 BCx: coag negative staph 1/4 2/26 BCx: no growth 2/25 MRSA PCR: negative  2/25 SARS Coronavirus: negative  2/25 Influenza A/B: negative   Thank you for allowing pharmacy to be a part of this patient's care.  Pearla Dubonnet, PharmD 09/02/2019 2:43 PM

## 2019-09-02 NOTE — Progress Notes (Signed)
Restless with 20 mins left in this dialysis treatment, unable to obtain bp in right leg, uf is low,  Necessary to [place] left wrist to monitor bp.

## 2019-09-03 ENCOUNTER — Inpatient Hospital Stay (HOSPITAL_COMMUNITY)
Admit: 2019-09-03 | Discharge: 2019-09-03 | Disposition: A | Discharge status: Home / Self Care | Payer: Medicare Other | Attending: Internal Medicine | Admitting: Internal Medicine

## 2019-09-03 DIAGNOSIS — R7881 Bacteremia: Secondary | ICD-10-CM

## 2019-09-03 LAB — GLUCOSE, CAPILLARY
Glucose-Capillary: 113 mg/dL — ABNORMAL HIGH (ref 70–99)
Glucose-Capillary: 114 mg/dL — ABNORMAL HIGH (ref 70–99)
Glucose-Capillary: 117 mg/dL — ABNORMAL HIGH (ref 70–99)
Glucose-Capillary: 134 mg/dL — ABNORMAL HIGH (ref 70–99)
Glucose-Capillary: 89 mg/dL (ref 70–99)

## 2019-09-03 LAB — CULTURE, BLOOD (ROUTINE X 2)
Culture: NO GROWTH
Special Requests: ADEQUATE

## 2019-09-03 LAB — PHOSPHORUS: Phosphorus: 2.6 mg/dL (ref 2.5–4.6)

## 2019-09-03 LAB — CBC
HCT: 30.1 % — ABNORMAL LOW (ref 39.0–52.0)
Hemoglobin: 9.5 g/dL — ABNORMAL LOW (ref 13.0–17.0)
MCH: 26.8 pg (ref 26.0–34.0)
MCHC: 31.6 g/dL (ref 30.0–36.0)
MCV: 85 fL (ref 80.0–100.0)
Platelets: 268 10*3/uL (ref 150–400)
RBC: 3.54 MIL/uL — ABNORMAL LOW (ref 4.22–5.81)
RDW: 17.3 % — ABNORMAL HIGH (ref 11.5–15.5)
WBC: 10.9 10*3/uL — ABNORMAL HIGH (ref 4.0–10.5)
nRBC: 1.6 % — ABNORMAL HIGH (ref 0.0–0.2)

## 2019-09-03 LAB — ECHOCARDIOGRAM COMPLETE
Height: 71 in
Weight: 2613.77 oz

## 2019-09-03 MED ORDER — VANCOMYCIN HCL IN DEXTROSE 750-5 MG/150ML-% IV SOLN: 750.0000 mg | INTRAVENOUS | Status: DC

## 2019-09-03 MED ORDER — VANCOMYCIN HCL IN DEXTROSE 750-5 MG/150ML-% IV SOLN
750.0000 mg | INTRAVENOUS | Status: DC
  Filled 2019-09-03: qty 150

## 2019-09-03 MED ORDER — CHLORHEXIDINE GLUCONATE CLOTH 2 % EX PADS: 6.0000 | MEDICATED_PAD | Freq: Every day | CUTANEOUS | Status: DC

## 2019-09-03 MED ORDER — VANCOMYCIN HCL 750 MG/150ML IV SOLN
750.0000 mg | INTRAVENOUS | Status: DC
  Administered 2019-09-03: 750 mg via INTRAVENOUS
  Filled 2019-09-03 (×4): qty 150

## 2019-09-03 NOTE — Progress Notes (Signed)
*  PRELIMINARY RESULTS* Echocardiogram 2D Echocardiogram has been performed.  Dennis Walker 09/03/2019, 11:12 AM

## 2019-09-03 NOTE — Progress Notes (Signed)
Patient received Dilaudid and Percocet prn over the night. He states he is still having 7/10 pain in his left leg. Patient's daughter [name] called and requested that the doctor's call her today.

## 2019-09-03 NOTE — Discharge Instructions (Signed)
Vascular Surgery Discharge Instructions Please change stump dressing daily or sooner if drainage is needed: Cover stump with Kerlix, covered with ACE bandage.

## 2019-09-03 NOTE — Progress Notes (Signed)
PT Cancellation Note  Patient Details Name: Dennis Walker MRN: [id number] DOB: 13-Aug-1950   Cancelled Treatment:    Reason Eval/Treat Not Completed: Patient at procedure or test/unavailable(Consult received and chart reviewed. Patient currently off unit at dialysis. Will re-attempt at later time/date as medically appropriate and available.)   Zuha Dejonge H. Owens Shark, PT, DPT, NCS 09/03/19, 12:00 PM [phone]

## 2019-09-03 NOTE — Progress Notes (Signed)
[facility], [place] 09/03/19  Subjective:   LOS: 6 03/01 0701 - 03/02 0700 In: 143.5 [I.V.:60.2; IV Piggyback:83.3] Out: 700   Doing fair States he ate his breakfast this morning Denies any shortness of breath  Objective:  Vital signs in last 24 hours:  Temp:  [97.1 F (36.2 C)-98.3 F (36.8 C)] 97.7 F (36.5 C) (03/02 0456) Pulse Rate:  [74-86] 80 (03/02 0653) Resp:  [9-29] 20 (03/02 0456) BP: (77-160)/(24-149) 152/48 (03/02 0653) SpO2:  [91 %-100 %] 100 % (03/02 0456) Weight:  [74.1 kg] 74.1 kg (03/01 1040)  Weight change:  Filed Weights   08/29/19 0528 09/01/19 0600 09/02/19 1040  Weight: 80.8 kg 75.7 kg 74.1 kg    Intake/Output:    Intake/Output Summary (Last 24 hours) at 09/03/2019 [id number] Last data filed at 09/03/2019 [id number] Gross per 24 hour  Intake 143.52 ml  Output 700 ml  Net -556.48 ml     Physical Exam: General:  Lying in the bed, no acute distress  HEENT  moist oral mucous membrane  Pulm/lungs  normal effort on room air, decreased breath sounds at bases  CVS/Heart  no rub  Abdomen:   Soft, nontender  Extremities:  New left AKA, right BKA  Neurologic:  Alert, able to answer questions  Skin:  No acute rashes  Access:  Right arm new AV graft, left arm old AV graft       Basic Metabolic Panel:  Recent Labs  Lab 08/29/19 0518 08/29/19 0518 08/30/19 0511 08/30/19 0511 08/31/19 0437 09/01/19 0418 09/02/19 0551  NA 137  --  140  --  139 137 140  K 3.9  --  3.4*  --  4.0 4.4 4.3  CL 97*  --  99  --  98 97* 97*  CO2 23  --  25  --  27 25 24   GLUCOSE 148*  --  115*  --  190* 174* 126*  BUN 45*  --  31*  --  46* 64* 81*  CREATININE 8.66*  --  6.42*  --  7.86* 9.04* 9.82*  CALCIUM 8.1*   < > 8.3*   < > 8.1* 8.2* 7.6*  MG  --   --  2.1  --   --   --   --   PHOS  --   --  3.2  --   --   --   --    < > = values in this interval not displayed.     CBC: Recent Labs  Lab 08/28/19 1746 08/29/19 0518 08/30/19 0511  08/30/19 0511 08/31/19 0437 08/31/19 0830 09/01/19 0418 09/02/19 0551 09/03/19 0500  WBC 17.6*   < > 20.3*   < > 22.1* 22.3* 19.8* 14.4* 10.9*  NEUTROABS 15.1*  --  17.5*  --  20.8*  --  18.0* 11.6*  --   HGB 9.8*   < > 9.9*   < > 10.9* 11.0* 9.8* 9.6* 9.5*  HCT 32.7*   < > 30.9*   < > 34.5* 34.3* 31.4* 28.9* 30.1*  MCV 89.6   < > 85.1   < > 85.8 84.5 84.2 81.4 85.0  PLT 228   < > 248   < > 292 292 297 278 268   < > = values in this interval not displayed.      Lab Results  Component Value Date   HEPBSAG Negative 12/26/2017   HEPBSAB Non Reactive 12/26/2017      Microbiology:  Recent Results (from the past 240 hour(s))  Blood Culture (routine x 2)     Status: Abnormal   Collection Time: 08/28/19  5:46 PM   Specimen: BLOOD  Result Value Ref Range Status   Specimen Description BLOOD RESIDENT  Final   Special Requests   Final    BOTTLES DRAWN AEROBIC AND ANAEROBIC Blood Culture results may not be optimal due to an inadequate volume of blood received in culture bottles   Culture  Setup Time   Final    GRAM POSITIVE COCCI AEROBIC BOTTLE ONLY CRITICAL RESULT CALLED TO, READ BACK BY AND VERIFIED WITH: JASON ROBBINS 08/29/19 @ 1803  [place] Performed at [facility], [address] [place], [place] [address]    Culture (A)  Final    STAPHYLOCOCCUS SPECIES (COAGULASE NEGATIVE) THE SIGNIFICANCE OF ISOLATING THIS ORGANISM FROM A SINGLE SET OF BLOOD CULTURES WHEN MULTIPLE SETS ARE DRAWN IS UNCERTAIN. PLEASE NOTIFY THE MICROBIOLOGY DEPARTMENT WITHIN ONE WEEK IF SPECIATION AND SENSITIVITIES ARE REQUIRED.    Report Status 08/31/2019 FINAL  Final  Respiratory Panel by RT PCR (Flu A&B, Covid) - Nasopharyngeal Swab     Status: None   Collection Time: 08/28/19  7:32 PM   Specimen: Nasopharyngeal Swab  Result Value Ref Range Status   SARS Coronavirus 2 by RT PCR NEGATIVE NEGATIVE Final    Comment: (NOTE) SARS-CoV-2 target nucleic acids are NOT DETECTED. The SARS-CoV-2 RNA is  generally detectable in upper respiratoy specimens during the acute phase of infection. The lowest concentration of SARS-CoV-2 viral copies this assay can detect is 131 copies/mL. A negative result does not preclude SARS-Cov-2 infection and should not be used as the sole basis for treatment or other patient management decisions. A negative result may occur with  improper specimen collection/handling, submission of specimen other than nasopharyngeal swab, presence of viral mutation(s) within the areas targeted by this assay, and inadequate number of viral copies (<131 copies/mL). A negative result must be combined with clinical observations, patient history, and epidemiological information. The expected result is Negative. Fact Sheet for Patients:  PinkCheek.be Fact Sheet for Healthcare Providers:  GravelBags.it This test is not yet ap proved or cleared by the [place] FDA and  has been authorized for detection and/or diagnosis of SARS-CoV-2 by FDA under an Emergency Use Authorization (EUA). This EUA will remain  in effect (meaning this test can be used) for the duration of the COVID-19 declaration under Section 564(b)(1) of the Act, 21 U.S.C. section 360bbb-3(b)(1), unless the authorization is terminated or revoked sooner.    Influenza A by PCR NEGATIVE NEGATIVE Final   Influenza B by PCR NEGATIVE NEGATIVE Final    Comment: (NOTE) The Xpert Xpress SARS-CoV-2/FLU/RSV assay is intended as an aid in  the diagnosis of influenza from Nasopharyngeal swab specimens and  should not be used as a sole basis for treatment. Nasal washings and  aspirates are unacceptable for Xpert Xpress SARS-CoV-2/FLU/RSV  testing. Fact Sheet for Patients: PinkCheek.be Fact Sheet for Healthcare Providers: GravelBags.it This test is not yet approved or cleared by the [place] FDA and   has been authorized for detection and/or diagnosis of SARS-CoV-2 by  FDA under an Emergency Use Authorization (EUA). This EUA will remain  in effect (meaning this test can be used) for the duration of the  Covid-19 declaration under Section 564(b)(1) of the Act, 21  U.S.C. section 360bbb-3(b)(1), unless the authorization is  terminated or revoked. Performed at [facility], [address] [place],  [place] [address]   MRSA PCR Screening     Status: None   Collection Time: 08/29/19 12:16 AM   Specimen: Foot, Left; Nasopharyngeal  Result Value Ref Range Status   MRSA by PCR NEGATIVE NEGATIVE Final    Comment:        The GeneXpert MRSA Assay (FDA approved for NASAL specimens only), is one component of a comprehensive MRSA colonization surveillance program. It is not intended to diagnose MRSA infection nor to guide or monitor treatment for MRSA infections. Performed at [facility], [address] [place], [place] [address]   Blood Culture (routine x 2)     Status: None   Collection Time: 08/29/19  1:31 AM   Specimen: Leg; Blood  Result Value Ref Range Status   Specimen Description LEG  Final   Special Requests Blood Culture adequate volume  Final   Culture   Final    NO GROWTH 5 DAYS Performed at [facility], [address], [place], [place] [address]    Report Status 09/03/2019 FINAL  Final  Culture, blood (single) w Reflex to ID Panel     Status: None (Preliminary result)   Collection Time: 08/30/19  6:12 PM   Specimen: BLOOD  Result Value Ref Range Status   Specimen Description BLOOD RAC  Final   Special Requests   Final    BOTTLES DRAWN AEROBIC AND ANAEROBIC Blood Culture adequate volume   Culture   Final    NO GROWTH 4 DAYS Performed at [facility], [address], [place], [place] [address]    Report Status PENDING  Incomplete  Culture, blood (single) w Reflex to ID Panel     Status: None (Preliminary result)    Collection Time: 08/30/19 10:27 PM   Specimen: BLOOD  Result Value Ref Range Status   Specimen Description BLOOD RIGHT ANTECUBITAL  Final   Special Requests   Final    BOTTLES DRAWN AEROBIC AND ANAEROBIC Blood Culture adequate volume   Culture   Final    NO GROWTH 4 DAYS Performed at [facility], [address] [place], [place] [address]    Report Status PENDING  Incomplete    Coagulation Studies: No results for input(s): LABPROT, INR in the last 72 hours.  Urinalysis: No results for input(s): COLORURINE, LABSPEC, PHURINE, GLUCOSEU, HGBUR, BILIRUBINUR, KETONESUR, PROTEINUR, UROBILINOGEN, NITRITE, LEUKOCYTESUR in the last 72 hours.  Invalid input(s): APPERANCEUR    Imaging: No results found.   Medications:   . sodium chloride Stopped (09/01/19 1601)  . piperacillin-tazobactam (ZOSYN)  IV 3.375 g (09/03/19 0824)  . [START ON 09/05/2019] vancomycin     . atorvastatin  20 mg Oral q1800  . Chlorhexidine Gluconate Cloth  6 each Topical Daily  . cholecalciferol  1,000 Units Oral q1800  . epoetin (EPOGEN/PROCRIT) injection  4,000 Units Intravenous Q T,Th,Sa-HD  . ferrous sulfate  325 mg Oral q1800  . gabapentin  100 mg Oral QPM  . heparin injection (subcutaneous)  5,000 Units Subcutaneous Q8H  . insulin aspart  0-15 Units Subcutaneous Q4H  . mouth rinse  15 mL Mouth Rinse BID  . multivitamin with minerals  1 tablet Oral QHS  . polyvinyl alcohol  1 drop Left Eye QID  . sertraline  100 mg Oral QHS  . sodium chloride flush  10-40 mL Intracatheter Q12H   acetaminophen, albuterol, bisacodyl, diphenhydrAMINE, HYDROmorphone (DILAUDID) injection, lip balm, ondansetron (ZOFRAN) IV, oxyCODONE-acetaminophen, oxyCODONE-acetaminophen, polyethylene glycol, sodium chloride flush  Assessment/ Plan:  68  y.o. male with end-stage renal disease on hemodialysis, hypertension, history of polio in childhood, hyperlipidemia, stroke with left-sided residual weakness, peripheral vascular  disease-history of right BKA, new left AKA, coronary disease status post PCI to proximal LAD, type 2 diabetes, diabetic neuropathy, congestive heart failure, asthma  Principal Problem:   Gangrenous toe ([place]) Active Problems:   Hypertension   ESRD (end stage renal disease) ([place])   Chronic systolic heart failure (H[facility])   Hypotension   PVD (peripheral vascular disease) ([facility])   Hx of BKA, right ([place])   Severe sepsis ([place])   Gangrene of left foot ([place])   Sepsis ([place])   Advanced care planning/counseling discussion   Goals of care, counseling/discussion   Palliative care by specialist  [facility] TTS// [facility]// rt arm AVG  #. ESRD We will continue dialysis on TTS schedule Dialysis graft for successfully cannulated with a 16-gauge needle and 300 blood flow rate Will place on dialysis again today to continue usual chronic schedule Patient will need to be able to sit in a chair for dialysis prior to discharge  #. Anemia of CKD  Lab Results  Component Value Date   HGB 9.5 (L) 09/03/2019   Low dose EPO with HD  #. SHPTH  No results found for: PTH Lab Results  Component Value Date   PHOS 3.2 08/30/2019   Monitor calcium and phos level during this admission  #. Diabetes type 2 with CKD with neuropathy, peripheral vascular disease Hgb A1c MFr Bld (%)  Date Value  08/29/2019 7.8 (H)  Managed with [facility] as outpatient  #Peripheral vascular disease History of right BKA Gangrene of left foot, nonsalvageable Left AKA on August 29, 2018  Discharge disposition Review of chart suggest patient is going [facility]   LOS: [place] 3/2/20219:28 [place], [place]

## 2019-09-03 NOTE — Progress Notes (Signed)
[facility] Daily Progress Note   Subjective: 08/31/19: Left above-the-knee amputation  Patient states he is feeling better. Some surgical site pain.   Objective: Vitals:   09/02/19 1459 09/02/19 2115 09/03/19 0456 09/03/19 0653  BP: (!) 149/88 (!) 130/92 (!) 96/24 (!) 152/48  Pulse: 74 82 74 80  Resp: 19 20 20    Temp: 97.8 F (36.6 C) 98.3 F (36.8 C) 97.7 F (36.5 C)   TempSrc: Oral Oral Oral   SpO2: 94% 91% 100%   Weight:      Height:        Intake/Output Summary (Last 24 hours) at 09/03/2019 1002 Last data filed at 09/03/2019 [id number] Gross per 24 hour  Intake 143.52 ml  Output 700 ml  Net -556.48 ml   Physical Exam: A&Ox3, NAD CV: RRR Pulmonary: CTA Bilaterally Abdomen: Soft, Nontender, Nondistended Vascular:             Left Lower Extremity: OR dressing removed, Staples are clean and dry. Stump is healthy. Thigh soft.    Laboratory: CBC    Component Value Date/Time   WBC 10.9 (H) 09/03/2019 0500   HGB 9.5 (L) 09/03/2019 0500   HCT 30.1 (L) 09/03/2019 0500   PLT 268 09/03/2019 0500   BMET    Component Value Date/Time   NA 140 09/02/2019 0551   K 4.3 09/02/2019 0551   CL 97 (L) 09/02/2019 0551   CO2 24 09/02/2019 0551   GLUCOSE 126 (H) 09/02/2019 0551   BUN 81 (H) 09/02/2019 0551   CREATININE 9.82 (H) 09/02/2019 0551   CALCIUM 7.6 (L) 09/02/2019 0551   GFRNONAA 5 (L) 09/02/2019 0551   GFRAA 6 (L) 09/02/2019 0551   Assessment/Planning: The patient is a 69 year old male who presented with left lower extremity ischemia no s/p Left AKA - POD#3  1) OR dressing removed today. Stump healthy. Daily dressing changes.  2) Would expect discharge Tuesday / Wed if medically stable. 3) Leukocytosis improving.  Discussed with Dr. Ellis Parents Helyne Genther PA-C 09/03/2019 10:02 AM

## 2019-09-03 NOTE — Progress Notes (Signed)
PROGRESS NOTE    Dennis Walker  [id number] DOB: 1950-11-01 DOA: 08/28/2019  PCP: Juluis Pitch, MD    LOS - 6   Brief Narrative:  Medical records reviewed and are as summarized below: Dennis H Dixonis an 69 y.o.malewithextensive pastmedical historyincludinghx of CAD s/p PCI to the proximal LAD, chronic HFpEF, stroke, PAD s/p right BKA, ESRD on HD Tues/Thurs/Sat,type 2 diabetes, childhood polio,blindnessand asthma who [facility]. The toe had become darker and malodorous over few days prior. Patient is followed closely outpatient by vascular surgery for severe PVD. In the ED, febrile T-max 101F,and hypotensive. Labs showed leukocytosis of 17.6k and stable anemia. He was treated with broad-spectrum antibiotics and required admission to ICU for vasopressors.He wasweaned offofpressorsthe following morningand care was transferred to the hospitalist service.  Subjective 3/2: Patient seen and examined at bedside this morning.  No acute events reported overnight.  He was sleeping comfortably but awoke to voice.  Reports he slept great last night.  Denies having any pain at this time.  Denies other acute complaints including fever chills chest pain shortness of breath nausea vomiting diarrhea.  Assessment & Plan:   Principal Problem:   Gangrenous toe ([place]) Active Problems:   Severe sepsis (HCC)   Hypertension   ESRD (end stage renal disease) (HCC)   Chronic systolic heart failure (HCC)   Hypotension   Hx of BKA, right (HCC)   PVD (peripheral vascular disease) ([place])   Gangrene of left foot (HCC)   Sepsis ([place])   Advanced care planning/counseling discussion   Goals of care, counseling/discussion   Palliative care by specialist   Severe sepsissecondary to gangrenous toe-present on admission as evidenced by fever, hypotension requiring vasopressor(due to ESRD to prevent volume overload)and  infected gangrenous toe. Vitals have stabilized, off pressors since morning after admission.  --monitor vitals closely --vasopressor if needed to maintain MAP>65  Gangrenous toe-present on admission, left second toe.  Due to severe PVD. Patient is status post right BKA previously. 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 --Vascular surgeryfollowing --d/c Zosyn and monitor clinically, cont Vanc for +blood culture as below --ID consulted --pain management and bowel regimen  Coagulase-negative Staph Positive Blood culture - on initial cultures, likely contaminate --repeat blood cultures pending, follow up --TTE negative for vegetations --ID recommends 2 weeks IV Vancomycin with dialysis if culture negative and TTE negative for vegetations  Acute Encephalopathy, likely septic/infectious due to above.  Present on admission. Resolved.  Hypertension- with hypotension on admission. BP meds on hold. Of note - BPcan only be checked on his LLE,given right BKA and bilateral upper extremity AV fistulas.  ESRD (end stage renal disease) --nephrology following for dialysis  Chronic systolic heart failure- currently hypovolemic.  --beta blockers on hold due to hypotension, resume when BP tolerates  Hypotension- present on admission with sepsis. Resolved as above. Monitor BP closely.  Hx ofRightBKA- due to severe PVD. Residual distal limb appears healthy.  PVD (peripheral vascular disease)- with prior right BKA, this admission had left AKA.   --hold ASA and Plavix until okay to resume per vascular  Anemia of chronic disease-secondary to ESRD. No evidence of active blood loss. --Monitor CBC  DVT prophylaxis:heparin Code Status: Full Code Family Communication:none at bedside during encounter, will call daughter   Disposition Plan:Anticipate return to [facility], possibly as soon as Wednesday 3/3, pending stability and clearance by vascular surgery.   Coming  FromSNF Exp DC Date3/3 Barriersas above Medically  Stable for Discharge?No  Consultants:  Vascular Surgery  Nephrology  Infectious disease  Procedures:  2/27 - left AKA  3/2 - Echo EF 0000000, grade 1 diastolic dysfunction, poor RV systolic function  Antimicrobials:  Vancomycin 2/25 >> TBD  Zosyn 2/25 >> TBD  Vanc/Cefepime/Flagyl - in ED 2/24     Objective: Vitals:   09/03/19 1245 09/03/19 1300 09/03/19 1315 09/03/19 1330  BP: 103/74 (!) 95/35 (!) 102/42 (!) 93/40  Pulse: 85 85 87 88  Resp: 17 16 18 17   Temp:      TempSrc:      SpO2:      Weight:      Height:        Intake/Output Summary (Last 24 hours) at 09/03/2019 1345 Last data filed at 09/03/2019 0528 Gross per 24 hour  Intake 143.52 ml  Output 700 ml  Net -556.48 ml   Filed Weights   09/01/19 0600 09/02/19 1040 09/03/19 1145  Weight: 75.7 kg 74.1 kg 72.9 kg    Examination:  General exam: awake, alert, no acute distress Respiratory system: CTAB, no wheezes, rales or rhonchi, normal respiratory effort. Cardiovascular system: normal S1/S2, RRR, no JVD, murmurs, rubs, gallops Gastrointestinal system: soft, NT, ND, +bowel sounds. Central nervous system: A&O x3. no gross focal neurologic deficits, normal speech Extremities: right BKA, left AKA with OR dressing still in place Skin: dry, intact, normal temperature, normal color Psychiatry: normal mood, congruent affect, judgement and insight appear normal    Data Reviewed: I have personally reviewed following labs and imaging studies  CBC: Recent Labs  Lab 08/28/19 1746 08/29/19 0518 08/30/19 0511 08/30/19 0511 08/31/19 0437 08/31/19 0830 09/01/19 0418 09/02/19 0551 09/03/19 0500  WBC 17.6*   < > 20.3*   < > 22.1* 22.3* 19.8* 14.4* 10.9*  NEUTROABS 15.1*  --  17.5*  --  20.8*  --  18.0* 11.6*  --   HGB 9.8*   < > 9.9*   < > 10.9* 11.0* 9.8* 9.6* 9.5*  HCT 32.7*   < > 30.9*   < > 34.5* 34.3* 31.4* 28.9* 30.1*  MCV 89.6   < >  85.1   < > 85.8 84.5 84.2 81.4 85.0  PLT 228   < > 248   < > 292 292 297 278 268   < > = values in this interval not displayed.   Basic Metabolic Panel: Recent Labs  Lab 08/29/19 0518 08/30/19 0511 08/31/19 0437 09/01/19 0418 09/02/19 0551  NA 137 140 139 137 140  K 3.9 3.4* 4.0 4.4 4.3  CL 97* 99 98 97* 97*  CO2 23 25 27 25 24   GLUCOSE 148* 115* 190* 174* 126*  BUN 45* 31* 46* 64* 81*  CREATININE 8.66* 6.42* 7.86* 9.04* 9.82*  CALCIUM 8.1* 8.3* 8.1* 8.2* 7.6*  MG  --  2.1  --   --   --   PHOS  --  3.2  --   --   --    GFR: Estimated Creatinine Clearance: 7.5 mL/min (A) (by C-G formula based on SCr of 9.82 mg/dL (H)). Liver Function Tests: Recent Labs  Lab 08/28/19 1746  AST 34  ALT 18  ALKPHOS 168*  BILITOT 1.6*  PROT 6.7  ALBUMIN 2.7*   No results for input(s): LIPASE, AMYLASE in the last 168 hours. No results for input(s): AMMONIA in the last 168 hours. Coagulation Profile: No results for input(s): INR, PROTIME in the last 168 hours. Cardiac Enzymes: No results for input(s): CKTOTAL,  CKMB, CKMBINDEX, TROPONINI in the last 168 hours. BNP (last 3 results) No results for input(s): PROBNP in the last 8760 hours. HbA1C: No results for input(s): HGBA1C in the last 72 hours. CBG: Recent Labs  Lab 09/02/19 2008 09/02/19 2127 09/03/19 0001 09/03/19 0348 09/03/19 0803  GLUCAP 88 102* 114* 113* 117*   Lipid Profile: No results for input(s): CHOL, HDL, LDLCALC, TRIG, CHOLHDL, LDLDIRECT in the last 72 hours. Thyroid Function Tests: No results for input(s): TSH, T4TOTAL, FREET4, T3FREE, THYROIDAB in the last 72 hours. Anemia Panel: No results for input(s): VITAMINB12, FOLATE, FERRITIN, TIBC, IRON, RETICCTPCT in the last 72 hours. Sepsis Labs: Recent Labs  Lab 08/28/19 1746 08/28/19 2137  PROCALCITON 2.05  --   LATICACIDVEN 0.8 0.8    Recent Results (from the past 240 hour(s))  Blood Culture (routine x 2)     Status: Abnormal   Collection Time: 08/28/19   5:46 PM   Specimen: BLOOD  Result Value Ref Range Status   Specimen Description BLOOD RESIDENT  Final   Special Requests   Final    BOTTLES DRAWN AEROBIC AND ANAEROBIC Blood Culture results may not be optimal due to an inadequate volume of blood received in culture bottles   Culture  Setup Time   Final    GRAM POSITIVE COCCI AEROBIC BOTTLE ONLY CRITICAL RESULT CALLED TO, READ BACK BY AND VERIFIED WITH: JASON ROBBINS 08/29/19 @ 1803  [place] Performed at [facility], [address] [place], [place] [address]    Culture (A)  Final    STAPHYLOCOCCUS SPECIES (COAGULASE NEGATIVE) THE SIGNIFICANCE OF ISOLATING THIS ORGANISM FROM A SINGLE SET OF BLOOD CULTURES WHEN MULTIPLE SETS ARE DRAWN IS UNCERTAIN. PLEASE NOTIFY THE MICROBIOLOGY DEPARTMENT WITHIN ONE WEEK IF SPECIATION AND SENSITIVITIES ARE REQUIRED.    Report Status 08/31/2019 FINAL  Final  Respiratory Panel by RT PCR (Flu A&B, Covid) - Nasopharyngeal Swab     Status: None   Collection Time: 08/28/19  7:32 PM   Specimen: Nasopharyngeal Swab  Result Value Ref Range Status   SARS Coronavirus 2 by RT PCR NEGATIVE NEGATIVE Final    Comment: (NOTE) SARS-CoV-2 target nucleic acids are NOT DETECTED. The SARS-CoV-2 RNA is generally detectable in upper respiratoy specimens during the acute phase of infection. The lowest concentration of SARS-CoV-2 viral copies this assay can detect is 131 copies/mL. A negative result does not preclude SARS-Cov-2 infection and should not be used as the sole basis for treatment or other patient management decisions. A negative result may occur with  improper specimen collection/handling, submission of specimen other than nasopharyngeal swab, presence of viral mutation(s) within the areas targeted by this assay, and inadequate number of viral copies (<131 copies/mL). A negative result must be combined with clinical observations, patient history, and epidemiological information. The expected result is  Negative. Fact Sheet for Patients:  PinkCheek.be Fact Sheet for Healthcare Providers:  GravelBags.it This test is not yet ap proved or cleared by the [place] FDA and  has been authorized for detection and/or diagnosis of SARS-CoV-2 by FDA under an Emergency Use Authorization (EUA). This EUA will remain  in effect (meaning this test can be used) for the duration of the COVID-19 declaration under Section 564(b)(1) of the Act, 21 U.S.C. section 360bbb-3(b)(1), unless the authorization is terminated or revoked sooner.    Influenza A by PCR NEGATIVE NEGATIVE Final   Influenza B by PCR NEGATIVE NEGATIVE Final    Comment: (NOTE) The Xpert Xpress SARS-CoV-2/FLU/RSV assay is intended as  an aid in  the diagnosis of influenza from Nasopharyngeal swab specimens and  should not be used as a sole basis for treatment. Nasal washings and  aspirates are unacceptable for Xpert Xpress SARS-CoV-2/FLU/RSV  testing. Fact Sheet for Patients: PinkCheek.be Fact Sheet for Healthcare Providers: GravelBags.it This test is not yet approved or cleared by the [place] FDA and  has been authorized for detection and/or diagnosis of SARS-CoV-2 by  FDA under an Emergency Use Authorization (EUA). This EUA will remain  in effect (meaning this test can be used) for the duration of the  Covid-19 declaration under Section 564(b)(1) of the Act, 21  U.S.C. section 360bbb-3(b)(1), unless the authorization is  terminated or revoked. Performed at [facility], [address] [place], [place] [address]   MRSA PCR Screening     Status: None   Collection Time: 08/29/19 12:16 AM   Specimen: Foot, Left; Nasopharyngeal  Result Value Ref Range Status   MRSA by PCR NEGATIVE NEGATIVE Final    Comment:        The GeneXpert MRSA Assay (FDA approved for NASAL specimens only), is one component of  a comprehensive MRSA colonization surveillance program. It is not intended to diagnose MRSA infection nor to guide or monitor treatment for MRSA infections. Performed at [facility], [address] [place], [place] [address]   Blood Culture (routine x 2)     Status: None   Collection Time: 08/29/19  1:31 AM   Specimen: Leg; Blood  Result Value Ref Range Status   Specimen Description LEG  Final   Special Requests Blood Culture adequate volume  Final   Culture   Final    NO GROWTH 5 DAYS Performed at [facility], [address] [place], [place] [address]    Report Status 09/03/2019 FINAL  Final  Culture, blood (single) w Reflex to ID Panel     Status: None (Preliminary result)   Collection Time: 08/30/19  6:12 PM   Specimen: BLOOD  Result Value Ref Range Status   Specimen Description BLOOD RAC  Final   Special Requests   Final    BOTTLES DRAWN AEROBIC AND ANAEROBIC Blood Culture adequate volume   Culture   Final    NO GROWTH 4 DAYS Performed at [facility], [address] [place], [place] [address]    Report Status PENDING  Incomplete  Culture, blood (single) w Reflex to ID Panel     Status: None (Preliminary result)   Collection Time: 08/30/19 10:27 PM   Specimen: BLOOD  Result Value Ref Range Status   Specimen Description BLOOD RIGHT ANTECUBITAL  Final   Special Requests   Final    BOTTLES DRAWN AEROBIC AND ANAEROBIC Blood Culture adequate volume   Culture   Final    NO GROWTH 4 DAYS Performed at [facility], [address], [place],  [address]    Report Status PENDING  Incomplete         Radiology Studies: ECHOCARDIOGRAM COMPLETE  Result Date: 09/03/2019    ECHOCARDIOGRAM REPORT   Patient Name:   Dennis Walker Date of Exam: 09/03/2019 Medical Rec #:  [id number]    Height:       71.0 in Accession #:    [id number]   Weight:       163.4 lb Date of Birth:  Mar 03, 1951    BSA:          1.935 m Patient Age:    67 years  BP:           152/48 mmHg Patient Gender: M            HR:           78 bpm. Exam Location:  [facility] Procedure: 2D Echo, Color Doppler and Cardiac Doppler Indications:     R78.81 Bacteremia  History:         Patient has prior history of Echocardiogram examinations. CHF,                  Previous Myocardial Infarction and CAD, Stroke and PVD, ESRD;                  Risk Factors:Hypertension, Dyslipidemia and Diabetes.  Sonographer:     Charmayne Sheer RDCS (AE) Referring Phys:  [id number] Floyce Stakes Daphene Chisholm Diagnosing Phys: Ida Rogue MD  Sonographer Comments: Suboptimal apical window. IMPRESSIONS  1. Left ventricular ejection fraction, by estimation, is 55 to 60%. The left ventricle has normal function. The left ventricle has no regional wall motion abnormalities. There is moderate left ventricular hypertrophy. Left ventricular diastolic parameters are consistent with Grade I diastolic dysfunction (impaired relaxation).  2. Right ventricular systolic function is moderately reduced. The right ventricular size is moderate to severely enlarged. Tricuspid regurgitation signal is inadequate for assessing PA pressure. Septal flattening ande RV dilation concerning for elevated  RV pressure and volume.  3. Left atrial size was mildly dilated.  4. Right atrial size was moderately dilated.  5. Tricuspid valve regurgitation is mild to moderate.  6. The aortic valve has heavy sclerosis, Very mild aortic valve stenosis.  7. The inferior vena cava is normal in size with greater than 50% respiratory variability, suggesting right atrial pressure of 3 mmHg.  8. No valve vegetation noted. FINDINGS  Left Ventricle: Left ventricular ejection fraction, by estimation, is 55 to 60%. The left ventricle has normal function. The left ventricle has no regional wall motion abnormalities. The left ventricular internal cavity size was normal in size. There is  moderate left ventricular hypertrophy. Left ventricular diastolic parameters are consistent  with Grade I diastolic dysfunction (impaired relaxation). Right Ventricle: The right ventricular size is moderately enlarged. No increase in right ventricular wall thickness. Right ventricular systolic function is moderately reduced. Tricuspid regurgitation signal is inadequate for assessing PA pressure. Left Atrium: Left atrial size was mildly dilated. Right Atrium: Right atrial size was moderately dilated. Pericardium: There is no evidence of pericardial effusion. Mitral Valve: The mitral valve is normal in structure and function. Normal mobility of the mitral valve leaflets. No evidence of mitral valve regurgitation. No evidence of mitral valve stenosis. MV peak gradient, 5.3 mmHg. The mean mitral valve gradient is 3.0 mmHg. Tricuspid Valve: The tricuspid valve is normal in structure. Tricuspid valve regurgitation is mild to moderate. No evidence of tricuspid stenosis. Aortic Valve: The aortic valve is normal in structure and function. Aortic valve regurgitation is not visualized. Mild aortic stenosis is present. Aortic valve mean gradient measures 8.0 mmHg. Aortic valve peak gradient measures 17.1 mmHg. Aortic valve area, by VTI measures 1.29 cm. Pulmonic Valve: The pulmonic valve was normal in structure. Pulmonic valve regurgitation is not visualized. No evidence of pulmonic stenosis. Aorta: The aortic root is normal in size and structure. Venous: The inferior vena cava is normal in size with greater than 50% respiratory variability, suggesting right atrial pressure of 3 mmHg. IAS/Shunts: No atrial level shunt detected by color flow Doppler.  LEFT VENTRICLE PLAX 2D LVIDd:  3.77 cm  Diastology LVIDs:         2.05 cm  LV e' lateral:   10.00 cm/s LV PW:         1.40 cm  LV E/e' lateral: 9.9 LV IVS:        0.76 cm  LV e' medial:    5.98 cm/s LVOT diam:     1.80 cm  LV E/e' medial:  16.6 LV SV:         46 LV SV Index:   24 LVOT Area:     2.54 cm  RIGHT VENTRICLE RV Basal diam:  3.39 cm LEFT ATRIUM              Index       RIGHT ATRIUM           Index LA diam:        4.10 cm 2.12 cm/m  RA Area:     18.40 cm LA Vol (A2C):   67.9 ml 35.09 ml/m RA Volume:   47.50 ml  24.55 ml/m LA Vol (A4C):   86.8 ml 44.86 ml/m LA Biplane Vol: 80.7 ml 41.71 ml/m  AORTIC VALVE                    PULMONIC VALVE AV Area (Vmax):    1.24 cm     PV Vmax:       0.84 m/s AV Area (Vmean):   1.39 cm     PV Vmean:      46.600 cm/s AV Area (VTI):     1.29 cm     PV VTI:        0.124 m AV Vmax:           207.00 cm/s  PV Peak grad:  2.9 mmHg AV Vmean:          123.000 cm/s PV Mean grad:  1.0 mmHg AV VTI:            0.353 m AV Peak Grad:      17.1 mmHg AV Mean Grad:      8.0 mmHg LVOT Vmax:         101.00 cm/s LVOT Vmean:        67.300 cm/s LVOT VTI:          0.179 m LVOT/AV VTI ratio: 0.51  AORTA Ao Root diam: 2.90 cm MITRAL VALVE MV Area (PHT): 3.37 cm     SHUNTS MV Peak grad:  5.3 mmHg     Systemic VTI:  0.18 m MV Mean grad:  3.0 mmHg     Systemic Diam: 1.80 cm MV Vmax:       1.15 m/s MV Vmean:      82.2 cm/s MV Decel Time: 225 msec MV E velocity: 99.00 cm/s MV A velocity: 107.00 cm/s MV E/A ratio:  0.93 Ida Rogue MD Electronically signed by Ida Rogue MD Signature Date/Time: 09/03/2019/11:41:02 AM    Final         Scheduled Meds: . atorvastatin  20 mg Oral q1800  . cholecalciferol  1,000 Units Oral q1800  . epoetin (EPOGEN/PROCRIT) injection  4,000 Units Intravenous Q T,Th,Sa-HD  . ferrous sulfate  325 mg Oral q1800  . gabapentin  100 mg Oral QPM  . heparin injection (subcutaneous)  5,000 Units Subcutaneous Q8H  . insulin aspart  0-15 Units Subcutaneous Q4H  . mouth rinse  15 mL Mouth Rinse BID  . multivitamin with minerals  1 tablet Oral QHS  .  polyvinyl alcohol  1 drop Left Eye QID  . sertraline  100 mg Oral QHS  . sodium chloride flush  10-40 mL Intracatheter Q12H   Continuous Infusions: . sodium chloride Stopped (09/01/19 1601)  . piperacillin-tazobactam (ZOSYN)  IV 3.375 g (09/03/19 0824)  . vancomycin         LOS: 6 days    Time spent: 35 minutes    Ezekiel Slocumb, DO [facility]   If 7PM-7AM, please contact night-coverage [email or web address] 09/03/2019, 1:45 PM

## 2019-09-04 LAB — BASIC METABOLIC PANEL
Anion gap: 16 — ABNORMAL HIGH (ref 5–15)
BUN: 29 mg/dL — ABNORMAL HIGH (ref 8–23)
CO2: 26 mmol/L (ref 22–32)
Calcium: 8 mg/dL — ABNORMAL LOW (ref 8.9–10.3)
Chloride: 97 mmol/L — ABNORMAL LOW (ref 98–111)
Creatinine, Ser: 5.28 mg/dL — ABNORMAL HIGH (ref 0.61–1.24)
GFR calc Af Amer: 12 mL/min — ABNORMAL LOW (ref >60)
GFR calc non Af Amer: 10 mL/min — ABNORMAL LOW (ref >60)
Glucose, Bld: 128 mg/dL — ABNORMAL HIGH (ref 70–99)
Potassium: 3.3 mmol/L — ABNORMAL LOW (ref 3.5–5.1)
Sodium: 139 mmol/L (ref 135–145)

## 2019-09-04 LAB — CULTURE, BLOOD (SINGLE)
Culture: NO GROWTH
Culture: NO GROWTH
Special Requests: ADEQUATE
Special Requests: ADEQUATE

## 2019-09-04 LAB — CBC
HCT: 28.2 % — ABNORMAL LOW (ref 39.0–52.0)
Hemoglobin: 8.9 g/dL — ABNORMAL LOW (ref 13.0–17.0)
MCH: 27 pg (ref 26.0–34.0)
MCHC: 31.6 g/dL (ref 30.0–36.0)
MCV: 85.5 fL (ref 80.0–100.0)
Platelets: 280 10*3/uL (ref 150–400)
RBC: 3.3 MIL/uL — ABNORMAL LOW (ref 4.22–5.81)
RDW: 17.4 % — ABNORMAL HIGH (ref 11.5–15.5)
WBC: 12.6 10*3/uL — ABNORMAL HIGH (ref 4.0–10.5)
nRBC: 2 % — ABNORMAL HIGH (ref 0.0–0.2)

## 2019-09-04 LAB — GLUCOSE, CAPILLARY
Glucose-Capillary: 105 mg/dL — ABNORMAL HIGH (ref 70–99)
Glucose-Capillary: 109 mg/dL — ABNORMAL HIGH (ref 70–99)
Glucose-Capillary: 124 mg/dL — ABNORMAL HIGH (ref 70–99)
Glucose-Capillary: 137 mg/dL — ABNORMAL HIGH (ref 70–99)
Glucose-Capillary: 149 mg/dL — ABNORMAL HIGH (ref 70–99)
Glucose-Capillary: 94 mg/dL (ref 70–99)

## 2019-09-04 LAB — SURGICAL PATHOLOGY

## 2019-09-04 MED ORDER — CLOPIDOGREL BISULFATE 75 MG PO TABS
75.0000 mg | ORAL_TABLET | Freq: Every day | ORAL | Status: DC
  Administered 2019-09-04 – 2019-09-05 (×2): 75 mg via ORAL
  Filled 2019-09-04 (×2): qty 1

## 2019-09-04 MED ORDER — METOPROLOL SUCCINATE ER 25 MG PO TB24
25.0000 mg | ORAL_TABLET | Freq: Every day | ORAL | Status: DC
  Administered 2019-09-04 – 2019-09-05 (×2): 25 mg via ORAL
  Filled 2019-09-04 (×2): qty 1

## 2019-09-04 MED ORDER — RENA-VITE PO TABS
1.0000 | ORAL_TABLET | Freq: Every day | ORAL | Status: DC
  Administered 2019-09-04 – 2019-09-05 (×2): 1 via ORAL
  Filled 2019-09-04 (×2): qty 1

## 2019-09-04 MED ORDER — NEPRO/CARBSTEADY PO LIQD
237.0000 mL | Freq: Two times a day (BID) | ORAL | Status: DC
  Administered 2019-09-04 – 2019-09-05 (×3): 237 mL via ORAL

## 2019-09-04 MED ORDER — CHLORHEXIDINE GLUCONATE CLOTH 2 % EX PADS
6.0000 | MEDICATED_PAD | Freq: Every day | CUTANEOUS | Status: DC
  Administered 2019-09-05: 6 via TOPICAL

## 2019-09-04 MED ORDER — ASPIRIN EC 81 MG PO TBEC
81.0000 mg | DELAYED_RELEASE_TABLET | Freq: Every day | ORAL | Status: DC
  Administered 2019-09-05: 81 mg via ORAL
  Filled 2019-09-04: qty 1

## 2019-09-04 NOTE — Progress Notes (Signed)
[facility], [place] 09/04/19  Subjective:   LOS: 7 03/02 0701 - 03/03 0700 In: 98.6 [I.V.:10; IV Piggyback:88.6] Out: 0   Doing fair Sitting up in bed eating breakfast Denies any shortness of breath  Objective:  Vital signs in last 24 hours:  Temp:  [98 F (36.7 C)-98.4 F (36.9 C)] 98.4 F (36.9 C) (03/03 1243) Pulse Rate:  [79-100] 86 (03/03 1243) Resp:  [13-20] 20 (03/03 0441) BP: (93-157)/(17-100) 157/100 (03/03 1243) SpO2:  [92 %-100 %] 100 % (03/03 1243)  Weight change: -1.2 kg Filed Weights   09/01/19 0600 09/02/19 1040 09/03/19 1145  Weight: 75.7 kg 74.1 kg 72.9 kg    Intake/Output:    Intake/Output Summary (Last 24 hours) at 09/04/2019 1247 Last data filed at 09/03/2019 2206 Gross per 24 hour  Intake 98.56 ml  Output 0 ml  Net 98.56 ml     Physical Exam: General:  Lying in the bed, no acute distress  HEENT  moist oral mucous membrane  Pulm/lungs  normal effort on room air, decreased breath sounds at bases  CVS/Heart  no rub  Abdomen:   Soft, nontender  Extremities:  New left AKA, right BKA  Neurologic:  Alert, able to answer questions  Skin:  No acute rashes  Access:  Right arm new AV graft, left arm old AV graft       Basic Metabolic Panel:  Recent Labs  Lab 08/30/19 0511 08/30/19 0511 08/31/19 0437 08/31/19 0437 09/01/19 0418 09/02/19 0551 09/03/19 1318 09/04/19 0500  NA 140  --  139  --  137 140  --  139  K 3.4*  --  4.0  --  4.4 4.3  --  3.3*  CL 99  --  98  --  97* 97*  --  97*  CO2 25  --  27  --  25 24  --  26  GLUCOSE 115*  --  190*  --  174* 126*  --  128*  BUN 31*  --  46*  --  64* 81*  --  29*  CREATININE 6.42*  --  7.86*  --  9.04* 9.82*  --  5.28*  CALCIUM 8.3*   < > 8.1*   < > 8.2* 7.6*  --  8.0*  MG 2.1  --   --   --   --   --   --   --   PHOS 3.2  --   --   --   --   --  2.6  --    < > = values in this interval not displayed.     CBC: Recent Labs  Lab 08/28/19 1746 08/29/19 0518  08/30/19 0511 08/30/19 0511 08/31/19 0437 08/31/19 0437 08/31/19 0830 09/01/19 0418 09/02/19 0551 09/03/19 0500 09/04/19 0500  WBC 17.6*   < > 20.3*   < > 22.1*   < > 22.3* 19.8* 14.4* 10.9* 12.6*  NEUTROABS 15.1*  --  17.5*  --  20.8*  --   --  18.0* 11.6*  --   --   HGB 9.8*   < > 9.9*   < > 10.9*   < > 11.0* 9.8* 9.6* 9.5* 8.9*  HCT 32.7*   < > 30.9*   < > 34.5*   < > 34.3* 31.4* 28.9* 30.1* 28.2*  MCV 89.6   < > 85.1   < > 85.8   < > 84.5 84.2 81.4 85.0 85.5  PLT 228   < >  248   < > 292   < > 292 297 278 268 280   < > = values in this interval not displayed.      Lab Results  Component Value Date   HEPBSAG Negative 12/26/2017   HEPBSAB Non Reactive 12/26/2017      Microbiology:  Recent Results (from the past 240 hour(s))  Blood Culture (routine x 2)     Status: Abnormal   Collection Time: 08/28/19  5:46 PM   Specimen: BLOOD  Result Value Ref Range Status   Specimen Description BLOOD RESIDENT  Final   Special Requests   Final    BOTTLES DRAWN AEROBIC AND ANAEROBIC Blood Culture results may not be optimal due to an inadequate volume of blood received in culture bottles   Culture  Setup Time   Final    GRAM POSITIVE COCCI AEROBIC BOTTLE ONLY CRITICAL RESULT CALLED TO, READ BACK BY AND VERIFIED WITH: JASON ROBBINS 08/29/19 @ 1803  [place] Performed at [facility], [address] [place], [place] [address]    Culture (A)  Final    STAPHYLOCOCCUS SPECIES (COAGULASE NEGATIVE) THE SIGNIFICANCE OF ISOLATING THIS ORGANISM FROM A SINGLE SET OF BLOOD CULTURES WHEN MULTIPLE SETS ARE DRAWN IS UNCERTAIN. PLEASE NOTIFY THE MICROBIOLOGY DEPARTMENT WITHIN ONE WEEK IF SPECIATION AND SENSITIVITIES ARE REQUIRED.    Report Status 08/31/2019 FINAL  Final  Respiratory Panel by RT PCR (Flu A&B, Covid) - Nasopharyngeal Swab     Status: None   Collection Time: 08/28/19  7:32 PM   Specimen: Nasopharyngeal Swab  Result Value Ref Range Status   SARS Coronavirus 2 by RT PCR NEGATIVE  NEGATIVE Final    Comment: (NOTE) SARS-CoV-2 target nucleic acids are NOT DETECTED. The SARS-CoV-2 RNA is generally detectable in upper respiratoy specimens during the acute phase of infection. The lowest concentration of SARS-CoV-2 viral copies this assay can detect is 131 copies/mL. A negative result does not preclude SARS-Cov-2 infection and should not be used as the sole basis for treatment or other patient management decisions. A negative result may occur with  improper specimen collection/handling, submission of specimen other than nasopharyngeal swab, presence of viral mutation(s) within the areas targeted by this assay, and inadequate number of viral copies (<131 copies/mL). A negative result must be combined with clinical observations, patient history, and epidemiological information. The expected result is Negative. Fact Sheet for Patients:  PinkCheek.be Fact Sheet for Healthcare Providers:  GravelBags.it This test is not yet ap proved or cleared by the [place] FDA and  has been authorized for detection and/or diagnosis of SARS-CoV-2 by FDA under an Emergency Use Authorization (EUA). This EUA will remain  in effect (meaning this test can be used) for the duration of the COVID-19 declaration under Section 564(b)(1) of the Act, 21 U.S.C. section 360bbb-3(b)(1), unless the authorization is terminated or revoked sooner.    Influenza A by PCR NEGATIVE NEGATIVE Final   Influenza B by PCR NEGATIVE NEGATIVE Final    Comment: (NOTE) The Xpert Xpress SARS-CoV-2/FLU/RSV assay is intended as an aid in  the diagnosis of influenza from Nasopharyngeal swab specimens and  should not be used as a sole basis for treatment. Nasal washings and  aspirates are unacceptable for Xpert Xpress SARS-CoV-2/FLU/RSV  testing. Fact Sheet for Patients: PinkCheek.be Fact Sheet for Healthcare  Providers: GravelBags.it This test is not yet approved or cleared by the [place] FDA and  has been authorized for detection and/or diagnosis of SARS-CoV-2 by  FDA under  an Emergency Use Authorization (EUA). This EUA will remain  in effect (meaning this test can be used) for the duration of the  Covid-19 declaration under Section 564(b)(1) of the Act, 21  U.S.C. section 360bbb-3(b)(1), unless the authorization is  terminated or revoked. Performed at [facility], [address] [place], [place] [address]   MRSA PCR Screening     Status: None   Collection Time: 08/29/19 12:16 AM   Specimen: Foot, Left; Nasopharyngeal  Result Value Ref Range Status   MRSA by PCR NEGATIVE NEGATIVE Final    Comment:        The GeneXpert MRSA Assay (FDA approved for NASAL specimens only), is one component of a comprehensive MRSA colonization surveillance program. It is not intended to diagnose MRSA infection nor to guide or monitor treatment for MRSA infections. Performed at [facility], [address] [place], [place] [address]   Blood Culture (routine x 2)     Status: None   Collection Time: 08/29/19  1:31 AM   Specimen: Leg; Blood  Result Value Ref Range Status   Specimen Description LEG  Final   Special Requests Blood Culture adequate volume  Final   Culture   Final    NO GROWTH 5 DAYS Performed at [facility], [address] [place], [place] [address]    Report Status 09/03/2019 FINAL  Final  Culture, blood (single) w Reflex to ID Panel     Status: None   Collection Time: 08/30/19  6:12 PM   Specimen: BLOOD  Result Value Ref Range Status   Specimen Description BLOOD RAC  Final   Special Requests   Final    BOTTLES DRAWN AEROBIC AND ANAEROBIC Blood Culture adequate volume   Culture   Final    NO GROWTH 5 DAYS Performed at [facility], [address], [place], [place] [address]    Report Status 09/04/2019  FINAL  Final  Culture, blood (single) w Reflex to ID Panel     Status: None   Collection Time: 08/30/19 10:27 PM   Specimen: BLOOD  Result Value Ref Range Status   Specimen Description BLOOD RIGHT ANTECUBITAL  Final   Special Requests   Final    BOTTLES DRAWN AEROBIC AND ANAEROBIC Blood Culture adequate volume   Culture   Final    NO GROWTH 5 DAYS Performed at [facility], [address], [place], [place] [address]    Report Status 09/04/2019 FINAL  Final    Coagulation Studies: No results for input(s): LABPROT, INR in the last 72 hours.  Urinalysis: No results for input(s): COLORURINE, LABSPEC, PHURINE, GLUCOSEU, HGBUR, BILIRUBINUR, KETONESUR, PROTEINUR, UROBILINOGEN, NITRITE, LEUKOCYTESUR in the last 72 hours.  Invalid input(s): APPERANCEUR    Imaging: ECHOCARDIOGRAM COMPLETE  Result Date: 09/03/2019    ECHOCARDIOGRAM REPORT   Patient Name:   Dennis Walker Date of Exam: 09/03/2019 Medical Rec #:  [id number]    Height:       71.0 in Accession #:    [id number]   Weight:       163.4 lb Date of Birth:  1950-11-01    BSA:          1.935 m Patient Age:    42 years     BP:           152/48 mmHg Patient Gender: M            HR:  78 bpm. Exam Location:  [facility] Procedure: 2D Echo, Color Doppler and Cardiac Doppler Indications:     R78.81 Bacteremia  History:         Patient has prior history of Echocardiogram examinations. CHF,                  Previous Myocardial Infarction and CAD, Stroke and PVD, ESRD;                  Risk Factors:Hypertension, Dyslipidemia and Diabetes.  Sonographer:     Charmayne Sheer RDCS (AE) Referring Phys:  [id number] Floyce Stakes GRIFFITH Diagnosing Phys: Ida Rogue MD  Sonographer Comments: Suboptimal apical window. IMPRESSIONS  1. Left ventricular ejection fraction, by estimation, is 55 to 60%. The left ventricle has normal function. The left ventricle has no regional wall motion abnormalities. There is moderate left ventricular hypertrophy. Left ventricular  diastolic parameters are consistent with Grade I diastolic dysfunction (impaired relaxation).  2. Right ventricular systolic function is moderately reduced. The right ventricular size is moderate to severely enlarged. Tricuspid regurgitation signal is inadequate for assessing PA pressure. Septal flattening ande RV dilation concerning for elevated  RV pressure and volume.  3. Left atrial size was mildly dilated.  4. Right atrial size was moderately dilated.  5. Tricuspid valve regurgitation is mild to moderate.  6. The aortic valve has heavy sclerosis, Very mild aortic valve stenosis.  7. The inferior vena cava is normal in size with greater than 50% respiratory variability, suggesting right atrial pressure of 3 mmHg.  8. No valve vegetation noted. FINDINGS  Left Ventricle: Left ventricular ejection fraction, by estimation, is 55 to 60%. The left ventricle has normal function. The left ventricle has no regional wall motion abnormalities. The left ventricular internal cavity size was normal in size. There is  moderate left ventricular hypertrophy. Left ventricular diastolic parameters are consistent with Grade I diastolic dysfunction (impaired relaxation). Right Ventricle: The right ventricular size is moderately enlarged. No increase in right ventricular wall thickness. Right ventricular systolic function is moderately reduced. Tricuspid regurgitation signal is inadequate for assessing PA pressure. Left Atrium: Left atrial size was mildly dilated. Right Atrium: Right atrial size was moderately dilated. Pericardium: There is no evidence of pericardial effusion. Mitral Valve: The mitral valve is normal in structure and function. Normal mobility of the mitral valve leaflets. No evidence of mitral valve regurgitation. No evidence of mitral valve stenosis. MV peak gradient, 5.3 mmHg. The mean mitral valve gradient is 3.0 mmHg. Tricuspid Valve: The tricuspid valve is normal in structure. Tricuspid valve regurgitation is  mild to moderate. No evidence of tricuspid stenosis. Aortic Valve: The aortic valve is normal in structure and function. Aortic valve regurgitation is not visualized. Mild aortic stenosis is present. Aortic valve mean gradient measures 8.0 mmHg. Aortic valve peak gradient measures 17.1 mmHg. Aortic valve area, by VTI measures 1.29 cm. Pulmonic Valve: The pulmonic valve was normal in structure. Pulmonic valve regurgitation is not visualized. No evidence of pulmonic stenosis. Aorta: The aortic root is normal in size and structure. Venous: The inferior vena cava is normal in size with greater than 50% respiratory variability, suggesting right atrial pressure of 3 mmHg. IAS/Shunts: No atrial level shunt detected by color flow Doppler.  LEFT VENTRICLE PLAX 2D LVIDd:         3.77 cm  Diastology LVIDs:         2.05 cm  LV e' lateral:   10.00 cm/s LV PW:  1.40 cm  LV E/e' lateral: 9.9 LV IVS:        0.76 cm  LV e' medial:    5.98 cm/s LVOT diam:     1.80 cm  LV E/e' medial:  16.6 LV SV:         46 LV SV Index:   24 LVOT Area:     2.54 cm  RIGHT VENTRICLE RV Basal diam:  3.39 cm LEFT ATRIUM             Index       RIGHT ATRIUM           Index LA diam:        4.10 cm 2.12 cm/m  RA Area:     18.40 cm LA Vol (A2C):   67.9 ml 35.09 ml/m RA Volume:   47.50 ml  24.55 ml/m LA Vol (A4C):   86.8 ml 44.86 ml/m LA Biplane Vol: 80.7 ml 41.71 ml/m  AORTIC VALVE                    PULMONIC VALVE AV Area (Vmax):    1.24 cm     PV Vmax:       0.84 m/s AV Area (Vmean):   1.39 cm     PV Vmean:      46.600 cm/s AV Area (VTI):     1.29 cm     PV VTI:        0.124 m AV Vmax:           207.00 cm/s  PV Peak grad:  2.9 mmHg AV Vmean:          123.000 cm/s PV Mean grad:  1.0 mmHg AV VTI:            0.353 m AV Peak Grad:      17.1 mmHg AV Mean Grad:      8.0 mmHg LVOT Vmax:         101.00 cm/s LVOT Vmean:        67.300 cm/s LVOT VTI:          0.179 m LVOT/AV VTI ratio: 0.51  AORTA Ao Root diam: 2.90 cm MITRAL VALVE MV Area (PHT):  3.37 cm     SHUNTS MV Peak grad:  5.3 mmHg     Systemic VTI:  0.18 m MV Mean grad:  3.0 mmHg     Systemic Diam: 1.80 cm MV Vmax:       1.15 m/s MV Vmean:      82.2 cm/s MV Decel Time: 225 msec MV E velocity: 99.00 cm/s MV A velocity: 107.00 cm/s MV E/A ratio:  0.93 Ida Rogue MD Electronically signed by Ida Rogue MD Signature Date/Time: 09/03/2019/11:41:02 AM    Final      Medications:   . sodium chloride Stopped (09/01/19 1601)  . vancomycin Stopped (09/03/19 1604)   . atorvastatin  20 mg Oral q1800  . cholecalciferol  1,000 Units Oral q1800  . epoetin (EPOGEN/PROCRIT) injection  4,000 Units Intravenous Q T,Th,Sa-HD  . feeding supplement (NEPRO CARB STEADY)  237 mL Oral BID BM  . ferrous sulfate  325 mg Oral q1800  . gabapentin  100 mg Oral QPM  . heparin injection (subcutaneous)  5,000 Units Subcutaneous Q8H  . insulin aspart  0-15 Units Subcutaneous Q4H  . mouth rinse  15 mL Mouth Rinse BID  . multivitamin  1 tablet Oral QHS  . multivitamin with minerals  1 tablet Oral QHS  .  polyvinyl alcohol  1 drop Left Eye QID  . sertraline  100 mg Oral QHS  . sodium chloride flush  10-40 mL Intracatheter Q12H   acetaminophen, albuterol, bisacodyl, diphenhydrAMINE, HYDROmorphone (DILAUDID) injection, lip balm, ondansetron (ZOFRAN) IV, oxyCODONE-acetaminophen, oxyCODONE-acetaminophen, polyethylene glycol, sodium chloride flush  Assessment/ Plan:  69 y.o. male with end-stage renal disease on hemodialysis, hypertension, history of polio in childhood, hyperlipidemia, stroke with left-sided residual weakness, peripheral vascular disease-history of right BKA, new left AKA, coronary disease status post PCI to proximal LAD, type 2 diabetes, diabetic neuropathy, congestive heart failure, asthma  Principal Problem:   Gangrenous toe (HCC) Active Problems:   Hypertension   ESRD (end stage renal disease) (HCC)   Chronic systolic heart failure (HCC)   Hypotension   PVD (peripheral vascular  disease) (HCC)   Hx of BKA, right ([place])   Severe sepsis ([place])   Gangrene of left foot ([place])   Sepsis ([place])   Advanced care planning/counseling discussion   Goals of care, counseling/discussion   Palliative care by specialist  [facility] TTS// [facility]// rt arm AVG  #. ESRD We will continue dialysis on TTS schedule Dialysis graft for successfully cannulated with a 16-gauge needle and 300 blood flow rate Routine hemodialysis tomorrow Patient will need to be able to sit in a chair for dialysis prior to discharge  #. Anemia of CKD  Lab Results  Component Value Date   HGB 8.9 (L) 09/04/2019   Low dose EPO with HD  #. SHPTH  No results found for: PTH Lab Results  Component Value Date   PHOS 2.6 09/03/2019   Monitor calcium and phos level during this admission  #. Diabetes type 2 with CKD with neuropathy, peripheral vascular disease Hgb A1c MFr Bld (%)  Date Value  08/29/2019 7.8 (H)  Managed with [facility] as outpatient  #Peripheral vascular disease History of right BKA Gangrene of left foot, nonsalvageable Left AKA on August 29, 2018      LOS: [place] 3/3/202112:47 PM  [place], [place]

## 2019-09-04 NOTE — Progress Notes (Signed)
Patient was placed in chair at 1530 by PT. Pt is resting comfortably.

## 2019-09-04 NOTE — TOC Progression Note (Signed)
Transition of Care [facility]) - Progression Note    Patient Details  Name: Dennis Walker MRN: [id number] Date of Birth: 1950-12-19  Transition of Care [facility]) CM/SW Contact  Beverly Sessions, RN Phone Number: 09/04/2019, 4:01 PM  Clinical Narrative:     PT has assessed and recommends [facility]  Fl2 sent for signature Patient currently up in chair, to make sure he can tolerate for HD  MD has ordered repeat covid test   Daughter updated on plan and she is in agreement   Expected Discharge Plan: [place] Barriers to Discharge: No Barriers Identified  Expected Discharge Plan and Services Expected Discharge Plan: [place] In-house Referral: Clinical Social Work   Post Acute Care Choice: Long Term Acute Care (LTAC) Living arrangements for the past 2 months: Skilled Nursing Facility(patient currently [facility])                                       Social Determinants of Health (SDOH) Interventions    Readmission Risk Interventions No flowsheet data found.

## 2019-09-04 NOTE — NC FL2 (Signed)
[place] LEVEL OF CARE SCREENING TOOL     IDENTIFICATION  Patient Name: Dennis Walker Birthdate: 07-25-1950 Sex: male Admission Date (Current Location): 08/28/2019  [facility] and [place] Number:  Engineering geologist and Address:         Provider Number: [id number]  Attending Physician Name and Address:  Nolberto Hanlon, MD  Relative Name and Phone Number:       Current Level of Care: Hospital Recommended Level of Care: [place] Prior Approval Number:    Date Approved/Denied:   PASRR Number: [id number]  Discharge Plan: SNF    Current Diagnoses: Patient Active Problem List   Diagnosis Date Noted  . Gangrene of left foot ([place])   . Sepsis ([place])   . Advanced care planning/counseling discussion   . Goals of care, counseling/discussion   . Palliative care by specialist   . Severe sepsis ([place]) 08/29/2019  . Gangrenous toe ([place]) 08/28/2019  . Hypotension 08/28/2019  . PVD (peripheral vascular disease) ([place]) 08/28/2019  . Hx of BKA, right ([place]) 08/28/2019  . Anemia in CKD (chronic kidney disease) 06/26/2019  . 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 Dialysis AV fistula malfunction ([place]) 06/26/2019  . Nephrotic range proteinuria 06/26/2019  . Encounter for orthopedic follow-up care 05/20/2019  . Atherosclerosis of native arteries of the extremities with ulceration ([place]) 11/12/2018  . Coronary artery disease involving native coronary artery of native heart without angina pectoris 08/18/2018  . Hyperlipidemia LDL goal <70 08/18/2018  . Preprocedural cardiovascular examination 08/18/2018  . MRSA bacteremia   . Infection of AV graft for dialysis ([place])   . Bacteremia 02/16/2018  . 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 Chest pain, mid sternal 01/03/2018  . Acute on chronic respiratory failure with hypoxia ([place]) 12/24/2017  . Pain in right hand 08/25/2017  . Elevated troponin 08/05/2017  . 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 Complication from renal dialysis  device 02/20/2017  . ESRD (end stage renal disease) ([place]) 02/12/2017  . Altered mental status 02/02/2017  . Atherosclerosis of native arteries of extremity with intermittent claudication ([place]) 12/20/2016  . Ulcer of amputation stump of lower extremity ([place]) 12/20/2016  . Diabetes ([place]) 12/20/2016  . Hypertension 12/20/2016  . Pure hypercholesterolemia 12/20/2016  . Bleeding 07/29/2016  . Panic attack 01/21/2016  . Chronic systolic heart failure ([place]) 10/27/2015  . Type 2 diabetes mellitus with diabetic nephropathy, with long-term current use of insulin ([place]) 04/06/2015  . Overweight (BMI 25.0-29.9) 12/04/2014  . Vitamin D deficiency 03/17/2014  . TIA (transient ischemic attack) 01/04/2014  . Vision abnormalities 01/03/2014  . Chronic hepatitis C virus infection ([place]) 11/01/2013  . Arteriovenous fistula ([place]) 08/27/2013  . CHF exacerbation ([place]) 12/12/2012  . Constipation 02/01/2012  . Ischemic cardiomyopathy 11/17/2010  . Retinopathy, diabetic, background ([place]) 11/11/2008  . Carotid artery stenosis 11/03/2008  . Cerebral infarct ([place]) 10/31/2008  . 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 Amputation of leg ([place]) 02/02/2004  . Poliomyelitis 07/04/1954    Orientation RESPIRATION BLADDER Height & Weight     Self, Place  Normal (anuric) Weight: 72.9 kg Height:  5\' 11"  (180.3 cm)  BEHAVIORAL SYMPTOMS/MOOD NEUROLOGICAL BOWEL NUTRITION STATUS      Continent Diet(renal fluid restriction 1200 ml)  AMBULATORY STATUS COMMUNICATION OF NEEDS Skin   Total Care Verbally Surgical wounds, Bruising(New left aka)                       Personal Care Assistance Level of Assistance  Functional Limitations Info             SPECIAL CARE FACTORS FREQUENCY  PT (By licensed PT), OT (By licensed OT)                    Contractures Contractures Info: Not present    Additional Factors Info  Code Status, Allergies Code Status Info: Full Allergies Info:  Cefuroxine, shrimp, sulfa antibiotics           Current Medications (09/04/2019):  This is the current hospital active medication list Current Facility-Administered Medications  Medication Dose Route Frequency Provider Last Rate Last Admin  . 0.9 %  sodium chloride infusion  250 mL Intravenous Continuous Jennye Boroughs, MD   Stopped at 09/01/19 1601  . acetaminophen (TYLENOL) suppository 650 mg  650 mg Rectal Q4H PRN Jennye Boroughs, MD   650 mg at 08/29/19 1803  . albuterol (PROVENTIL) (2.5 MG/3ML) 0.083% nebulizer solution 2.5 mg  2.5 mg Inhalation Q4H PRN Jennye Boroughs, MD   2.5 mg at 09/03/19 1643  . atorvastatin (LIPITOR) tablet 20 mg  20 mg Oral q1800 Jennye Boroughs, MD   20 mg at 09/03/19 1611  . bisacodyl (DULCOLAX) EC tablet 5 mg  5 mg Oral Daily PRN Jennye Boroughs, MD      . cholecalciferol (VITAMIN D3) tablet 1,000 Units  1,000 Units Oral [id number] Jennye Boroughs, MD   1,000 Units at 09/03/19 1611  . diphenhydrAMINE (BENADRYL) capsule 25 mg  25 mg Oral Q6H PRN Sharion Settler, NP   25 mg at 09/02/19 2046  . epoetin alfa (EPOGEN) injection 4,000 Units  4,000 Units Intravenous Q T,Th,Sa-HD Murlean Iba, MD   4,000 Units at 09/03/19 1246  . feeding supplement (NEPRO CARB STEADY) liquid 237 mL  237 mL Oral BID BM Nolberto Hanlon, MD   237 mL at 09/04/19 1114  . ferrous sulfate tablet 325 mg  325 mg Oral q1800 Jennye Boroughs, MD   325 mg at 09/03/19 1611  . gabapentin (NEURONTIN) capsule 100 mg  100 mg Oral QPM Jennye Boroughs, MD   100 mg at 09/03/19 1611  . heparin injection 5,000 Units  5,000 Units Subcutaneous Q8H Jennye Boroughs, MD   5,000 Units at 09/04/19 0449  . HYDROmorphone (DILAUDID) injection 1 mg  1 mg Intravenous Q3H PRN Nicole Kindred A, DO   1 mg at 09/03/19 1612  . insulin aspart (novoLOG) injection 0-15 Units  0-15 Units Subcutaneous Q4H Jennye Boroughs, MD   2 Units at 09/04/19 1251  . lip balm (BLISTEX) ointment   Topical PRN Jennye Boroughs, MD   Given at 09/03/19 1615  .  MEDLINE mouth rinse  15 mL Mouth Rinse BID Jennye Boroughs, MD   15 mL at 09/03/19 2205  . multivitamin (RENA-VIT) tablet 1 tablet  1 tablet Oral QHS Nolberto Hanlon, MD      . multivitamin with minerals tablet 1 tablet  1 tablet Oral QHS Jennye Boroughs, MD   1 tablet at 09/04/19 1113  . ondansetron (ZOFRAN) injection 4 mg  4 mg Intravenous Q6H PRN Jennye Boroughs, MD      . oxyCODONE-acetaminophen (PERCOCET/ROXICET) 5-325 MG per tablet 1 tablet  1 tablet Oral Q4H PRN Nicole Kindred A, DO   1 tablet at 09/02/19 2308  . oxyCODONE-acetaminophen (PERCOCET/ROXICET) 5-325 MG per tablet 2 tablet  2 tablet Oral Q4H PRN Nicole Kindred A, DO   2 tablet at 09/04/19 1114  . polyethylene glycol (MIRALAX / GLYCOLAX) packet 17 g  17  g Oral Daily PRN Jennye Boroughs, MD      . polyvinyl alcohol (LIQUIFILM TEARS) 1.4 % ophthalmic solution 1 drop  1 drop Left Eye QID Jennye Boroughs, MD   1 drop at 09/04/19 1114  . sertraline (ZOLOFT) tablet 100 mg  100 mg Oral QHS Jennye Boroughs, MD   100 mg at 09/03/19 2204  . sodium chloride flush (NS) 0.9 % injection 10-40 mL  10-40 mL Intracatheter Q12H Jennye Boroughs, MD   10 mL at 09/04/19 1116  . sodium chloride flush (NS) 0.9 % injection 10-40 mL  10-40 mL Intracatheter PRN Jennye Boroughs, MD      . vancomycin Alcus Dad) IVPB 750 mg/150 mL  750 mg Intravenous Q T,Th,Sa-HD Pearla Dubonnet, RPH   Stopped at 09/03/19 1604     Discharge Medications: Please see discharge summary for a list of discharge medications.  Relevant Imaging Results:  Relevant Lab Results:   Additional Information SS [id number]  Beverly Sessions, RN

## 2019-09-04 NOTE — Evaluation (Signed)
Physical Therapy Evaluation Patient Details Name: Dennis Walker MRN: [id number] DOB: 05-21-1951 Today's Date: 09/04/2019   History of Present Illness  presented to ER from LTC secondary to gangrenous L toe; admitted for management of L toe infection, ultimlatey culminating in L AKA (08/31/19)  Clinical Impression  Patient resting with eyes closed upon arrival to room, but awakens easily to voice/light touch.  Speech somewhat garbled, slurred at times, but improves with alertness and enunciation.  Baseline hemiparesis noted to L UE (nearly flaccid) with generalized weakness throughout all extremities (bilat hands contracted).  L UE/LE globally edematous.  Bilat hips and R knee ROM grossly WFL; able to achieve neutral positioning at all joints.  Currently requiring mod/max assist +2 for bed mobility; mod/max assist (improving to brief periods of close sup) for unsupported sitting balance; max assist+2 for squat pivot transfers, bed/chair over level surfaces.  Extensive assist for trunk control, weight shift, lift off and lateral movement between surfaces.  R LE prosthesis donned for all sitting and transfers; dep for donning and management. Gait not assessed; non-ambulatory at baseline. Would benefit from skilled PT to address above deficits and promote optimal return to PLOF.; recommend transition to STR upon discharge from acute hospitalization.  If patient/family were to pursue discharge home, recommend hospital bed, hoyer lift, manual WC and DABSC for home use.    Patient suffers from R LE BKA, L LE AKA which impairs his/her ability to perform daily activities like toileting, feeding, dressing, grooming, bathing in the home. A cane, walker, crutch will not resolve the patient's issue with performing activities of daily living. A lightweight wheelchair is required/recommended and will allow patient to safely perform daily activities.   Patient can safely propel the wheelchair in the home or has a  caregiver who can provide assistance.       Follow Up Recommendations SNF    Equipment Recommendations       Recommendations for Other Services       Precautions / Restrictions Precautions Precautions: Fall Restrictions Weight Bearing Restrictions: No      Mobility  Bed Mobility Overal bed mobility: Needs Assistance Bed Mobility: Supine to Sit     Supine to sit: Mod assist;+2 for physical assistance        Transfers Overall transfer level: Needs assistance   Transfers: Lateral/Scoot Transfers          Lateral/Scoot Transfers: Max assist;+2 physical assistance General transfer comment: assist for trunk control, weight shift, lift off and lateral movement  Ambulation/Gait             General Gait Details: non-ambulatory at baseline  Stairs            Wheelchair Mobility    Modified Rankin (Stroke Patients Only)       Balance Overall balance assessment: Needs assistance Sitting-balance support: No upper extremity supported;Feet supported Sitting balance-Leahy Scale: Poor Sitting balance - Comments: mod/max assist for initial sitting balance (R posterior/lateral lean)                                     Pertinent Vitals/Pain Pain Assessment: Faces Faces Pain Scale: Hurts a little bit Pain Location: L LE Pain Descriptors / Indicators: Aching;Grimacing;Guarding Pain Intervention(s): Limited activity within patient's tolerance;Monitored during session;Repositioned    Home Living Family/patient expects to be discharged to:: Skilled nursing facility  Prior Function Level of Independence: Needs assistance         Comments: Difficulty to fully ascertain baseline transfer status; does relay that he could sit indep edge of bed for meals and utilized R LE prosthesis when transferring (stand/squat pivot?) with assist.  WC level as primary mobility     Hand Dominance        Extremity/Trunk  Assessment   Upper Extremity Assessment Upper Extremity Assessment: (R UE grossly 3-/5 throughout, 1-4 digits fixed in PIP/DIP flexion; L biceps grossly 2-/5, otherwise generally flaccid and edematous)    Lower Extremity Assessment Lower Extremity Assessment: (R LE grossly 4-/5 throughout; L LE grossly 3-/5 (limited by pain, edema); bilat hip extension to neutral,)       Communication   Communication: (speech somewhat garbled at times)  Cognition Arousal/Alertness: Lethargic Behavior During Therapy: WFL for tasks assessed/performed Overall Cognitive Status: Within Functional Limits for tasks assessed                                 General Comments: oriented to self, follows commands      General Comments      Exercises Other Exercises Other Exercises: Mod/max assist for initial sitting balance (R posterior/lateral lean). Improves to close sup with accommodation to position and weight shifting activities, but unable to maintain with divided attention or dual-task activities.  R LE prosthesis donned throughout for balance/closed-chain WBing (dep for donning, positioning and management) Other Exercises: L LE limb wrapping, dep assist.  Significant sangiunous drainage to dressing; RN informed/aware and to bedside for visual assessment.   Assessment/Plan    PT Assessment Patient needs continued PT services  PT Problem List Decreased strength;Decreased range of motion;Decreased activity tolerance;Decreased balance;Decreased mobility;Decreased coordination;Decreased knowledge of use of DME;Decreased cognition;Decreased safety awareness;Decreased knowledge of precautions;Cardiopulmonary status limiting activity;Pain;Decreased skin integrity       PT Treatment Interventions DME instruction;Gait training;Functional mobility training;Therapeutic activities;Therapeutic exercise;Wheelchair mobility training;Balance training;Neuromuscular re-education;Patient/family education     PT Goals (Current goals can be found in the Care Plan section)  Acute Rehab PT Goals Patient Stated Goal: to go back home with my daughter PT Goal Formulation: With patient Time For Goal Achievement: 09/18/19 Potential to Achieve Goals: Fair    Frequency 7X/week   Barriers to discharge        Co-evaluation               AM-PAC PT "6 Clicks" Mobility  Outcome Measure Help needed turning from your back to your side while in a flat bed without using bedrails?: A Lot   Help needed moving to and from a bed to a chair (including a wheelchair)?: Total Help needed standing up from a chair using your arms (e.g., wheelchair or bedside chair)?: Total Help needed to walk in hospital room?: Total Help needed climbing 3-5 steps with a railing? : Total 6 Click Score: 6    End of Session   Activity Tolerance: Patient tolerated treatment well Patient left: in chair;with call bell/phone within reach;with chair alarm set Nurse Communication: Mobility status PT Visit Diagnosis: Muscle weakness (generalized) (M62.81);Pain;Other symptoms and signs involving the nervous system (R29.898) Pain - Right/Left: Left Pain - part of body: Leg    Time: 1459-1540 PT Time Calculation (min) (ACUTE ONLY): 41 min   Charges:   PT Evaluation $PT Eval Moderate Complexity: 1 Mod PT Treatments $Therapeutic Activity: 23-37 mins        Cyril Mourning  Claudie Revering, PT, DPT, NCS 09/04/19, 4:10 PM [phone]

## 2019-09-04 NOTE — Progress Notes (Signed)
PROGRESS NOTE    Dennis Walker  [id number] DOB: 1950-11-13 DOA: 08/28/2019 PCP: Juluis Pitch, MD    Brief Narrative:  [place]ctis Dixonis an 69 y.o.malewithextensive pastmedical historyincludinghx of CAD s/p PCI to the proximal LAD, chronic HFpEF, stroke, PAD s/p right BKA, ESRD on HD Tues/Thurs/Sat,type 2 diabetes, childhood polio,blindnessand asthma who [facility]. The toe had become darker and malodorous over few days prior. Patient is followed closely outpatient by vascular surgery for severe PVD. In the ED, febrile T-max 101F,and hypotensive. Labs showed leukocytosis of 17.6k and stable anemia. He was treated with broad-spectrum antibiotics and required admission to ICU for vasopressors.He wasweaned offofpressorsthe following morningand care was transferred to the hospitalist service.    Consultants:   Critical care, nephrology, ID  Procedures: TLC  Antimicrobials:   vancomycin   Subjective: Pt reports "just not feeling well today", but unable to explain exactly whats going on.  Objective: Vitals:   09/03/19 1557 09/03/19 2017 09/04/19 0441 09/04/19 1243  BP: (!) 145/48 (!) 152/17 (!) 142/48 (!) 157/100  Pulse: 100 86 79 86  Resp: 18 20 20    Temp: 98.4 F (36.9 C) 98 F (36.7 C) 98.3 F (36.8 C) 98.4 F (36.9 C)  TempSrc: Oral Oral Oral Oral  SpO2: 92% 99% 100% 100%  Weight:      Height:        Intake/Output Summary (Last 24 hours) at 09/04/2019 1539 Last data filed at 09/03/2019 2206 Gross per 24 hour  Intake 10 ml  Output --  Net 10 ml   Filed Weights   09/01/19 0600 09/02/19 1040 09/03/19 1145  Weight: 75.7 kg 74.1 kg 72.9 kg    Examination:  General exam: Appears calm and comfortable , sitting up in bed Respiratory system: Clear to auscultation with poor respiratory effort . Cardiovascular system: S1 & S2 heard, RRR. No  murmurs, rubs, gallops or clicks.   Gastrointestinal system: Abdomen is nondistended, soft and nontender. Normal bowel sounds heard. Central nervous system: awake and alert.  Extremities: Bilateral AKA Skin: Warm dry Psychiatry:  Mood & affect appropriate in current setting.     Data Reviewed: I have personally reviewed following labs and imaging studies  CBC: Recent Labs  Lab 08/28/19 1746 08/29/19 0518 08/30/19 0511 08/30/19 0511 08/31/19 0437 08/31/19 0437 08/31/19 0830 09/01/19 0418 09/02/19 0551 09/03/19 0500 09/04/19 0500  WBC 17.6*   < > 20.3*   < > 22.1*   < > 22.3* 19.8* 14.4* 10.9* 12.6*  NEUTROABS 15.1*  --  17.5*  --  20.8*  --   --  18.0* 11.6*  --   --   HGB 9.8*   < > 9.9*   < > 10.9*   < > 11.0* 9.8* 9.6* 9.5* 8.9*  HCT 32.7*   < > 30.9*   < > 34.5*   < > 34.3* 31.4* 28.9* 30.1* 28.2*  MCV 89.6   < > 85.1   < > 85.8   < > 84.5 84.2 81.4 85.0 85.5  PLT 228   < > 248   < > 292   < > 292 297 278 268 280   < > = values in this interval not displayed.   Basic Metabolic Panel: Recent Labs  Lab 08/30/19 0511 08/31/19 0437 09/01/19 0418 09/02/19 0551 09/03/19 1318 09/04/19 0500  NA 140 139 137 140  --  139  K 3.4* 4.0 4.4 4.3  --  3.3*  CL 99 98 97* 97*  --  97*  CO2 25 27 25 24   --  26  GLUCOSE 115* 190* 174* 126*  --  128*  BUN 31* 46* 64* 81*  --  29*  CREATININE 6.42* 7.86* 9.04* 9.82*  --  5.28*  CALCIUM 8.3* 8.1* 8.2* 7.6*  --  8.0*  MG 2.1  --   --   --   --   --   PHOS 3.2  --   --   --  2.6  --    GFR: Estimated Creatinine Clearance: 14 mL/min (A) (by C-G formula based on SCr of 5.28 mg/dL (H)). Liver Function Tests: Recent Labs  Lab 08/28/19 1746  AST 34  ALT 18  ALKPHOS 168*  BILITOT 1.6*  PROT 6.7  ALBUMIN 2.7*   No results for input(s): LIPASE, AMYLASE in the last 168 hours. No results for input(s): AMMONIA in the last 168 hours. Coagulation Profile: No results for input(s): INR, PROTIME in the last 168 hours. Cardiac Enzymes: No results for input(s): CKTOTAL,  CKMB, CKMBINDEX, TROPONINI in the last 168 hours. BNP (last 3 results) No results for input(s): PROBNP in the last 8760 hours. HbA1C: No results for input(s): HGBA1C in the last 72 hours. CBG: Recent Labs  Lab 09/03/19 2018 09/04/19 0004 09/04/19 0443 09/04/19 0729 09/04/19 1151  GLUCAP 134* 137* 109* 94 124*   Lipid Profile: No results for input(s): CHOL, HDL, LDLCALC, TRIG, CHOLHDL, LDLDIRECT in the last 72 hours. Thyroid Function Tests: No results for input(s): TSH, T4TOTAL, FREET4, T3FREE, THYROIDAB in the last 72 hours. Anemia Panel: No results for input(s): VITAMINB12, FOLATE, FERRITIN, TIBC, IRON, RETICCTPCT in the last 72 hours. Sepsis Labs: Recent Labs  Lab 08/28/19 1746 08/28/19 2137  PROCALCITON 2.05  --   LATICACIDVEN 0.8 0.8    Recent Results (from the past 240 hour(s))  Blood Culture (routine x 2)     Status: Abnormal   Collection Time: 08/28/19  5:46 PM   Specimen: BLOOD  Result Value Ref Range Status   Specimen Description BLOOD RESIDENT  Final   Special Requests   Final    BOTTLES DRAWN AEROBIC AND ANAEROBIC Blood Culture results may not be optimal due to an inadequate volume of blood received in culture bottles   Culture  Setup Time   Final    GRAM POSITIVE COCCI AEROBIC BOTTLE ONLY CRITICAL RESULT CALLED TO, READ BACK BY AND VERIFIED WITH: JASON ROBBINS 08/29/19 @ 1803  Gisela Performed at [facility], [address] [place], [place] [address]    Culture (A)  Final    STAPHYLOCOCCUS SPECIES (COAGULASE NEGATIVE) THE SIGNIFICANCE OF ISOLATING THIS ORGANISM FROM A SINGLE SET OF BLOOD CULTURES WHEN MULTIPLE SETS ARE DRAWN IS UNCERTAIN. PLEASE NOTIFY THE MICROBIOLOGY DEPARTMENT WITHIN ONE WEEK IF SPECIATION AND SENSITIVITIES ARE REQUIRED.    Report Status 08/31/2019 FINAL  Final  Respiratory Panel by RT PCR (Flu A&B, Covid) - Nasopharyngeal Swab     Status: None   Collection Time: 08/28/19  7:32 PM   Specimen: Nasopharyngeal Swab  Result  Value Ref Range Status   SARS Coronavirus 2 by RT PCR NEGATIVE NEGATIVE Final    Comment: (NOTE) SARS-CoV-2 target nucleic acids are NOT DETECTED. The SARS-CoV-2 RNA is generally detectable in upper respiratoy specimens during the acute phase of infection. The lowest concentration of SARS-CoV-2 viral copies this assay can detect is 131 copies/mL. A negative result does not preclude SARS-Cov-2 infection and should not  be used as the sole basis for treatment or other patient management decisions. A negative result may occur with  improper specimen collection/handling, submission of specimen other than nasopharyngeal swab, presence of viral mutation(s) within the areas targeted by this assay, and inadequate number of viral copies (<131 copies/mL). A negative result must be combined with clinical observations, patient history, and epidemiological information. The expected result is Negative. Fact Sheet for Patients:  PinkCheek.be Fact Sheet for Healthcare Providers:  GravelBags.it This test is not yet ap proved or cleared by the [place] FDA and  has been authorized for detection and/or diagnosis of SARS-CoV-2 by FDA under an Emergency Use Authorization (EUA). This EUA will remain  in effect (meaning this test can be used) for the duration of the COVID-19 declaration under Section 564(b)(1) of the Act, 21 U.S.C. section 360bbb-3(b)(1), unless the authorization is terminated or revoked sooner.    Influenza A by PCR NEGATIVE NEGATIVE Final   Influenza B by PCR NEGATIVE NEGATIVE Final    Comment: (NOTE) The Xpert Xpress SARS-CoV-2/FLU/RSV assay is intended as an aid in  the diagnosis of influenza from Nasopharyngeal swab specimens and  should not be used as a sole basis for treatment. Nasal washings and  aspirates are unacceptable for Xpert Xpress SARS-CoV-2/FLU/RSV  testing. Fact Sheet for  Patients: PinkCheek.be Fact Sheet for Healthcare Providers: GravelBags.it This test is not yet approved or cleared by the [place] FDA and  has been authorized for detection and/or diagnosis of SARS-CoV-2 by  FDA under an Emergency Use Authorization (EUA). This EUA will remain  in effect (meaning this test can be used) for the duration of the  Covid-19 declaration under Section 564(b)(1) of the Act, 21  U.S.C. section 360bbb-3(b)(1), unless the authorization is  terminated or revoked. Performed at [facility], [address] [place], [place] [address]   MRSA PCR Screening     Status: None   Collection Time: 08/29/19 12:16 AM   Specimen: Foot, Left; Nasopharyngeal  Result Value Ref Range Status   MRSA by PCR NEGATIVE NEGATIVE Final    Comment:        The GeneXpert MRSA Assay (FDA approved for NASAL specimens only), is one component of a comprehensive MRSA colonization surveillance program. It is not intended to diagnose MRSA infection nor to guide or monitor treatment for MRSA infections. Performed at [facility], [address] [place], [place] [address]   Blood Culture (routine x 2)     Status: None   Collection Time: 08/29/19  1:31 AM   Specimen: Leg; Blood  Result Value Ref Range Status   Specimen Description LEG  Final   Special Requests Blood Culture adequate volume  Final   Culture   Final    NO GROWTH 5 DAYS Performed at [facility], [address], [place], [place] [address]    Report Status 09/03/2019 FINAL  Final  Culture, blood (single) w Reflex to ID Panel     Status: None   Collection Time: 08/30/19  6:12 PM   Specimen: BLOOD  Result Value Ref Range Status   Specimen Description BLOOD RAC  Final   Special Requests   Final    BOTTLES DRAWN AEROBIC AND ANAEROBIC Blood Culture adequate volume   Culture   Final    NO GROWTH 5 DAYS Performed at [facility], [address], [place], [place] [address]    Report Status 09/04/2019 FINAL  Final  Culture, blood (single) w Reflex  to ID Panel     Status: None   Collection Time: 08/30/19 10:27 PM   Specimen: BLOOD  Result Value Ref Range Status   Specimen Description BLOOD RIGHT ANTECUBITAL  Final   Special Requests   Final    BOTTLES DRAWN AEROBIC AND ANAEROBIC Blood Culture adequate volume   Culture   Final    NO GROWTH 5 DAYS Performed at [facility], [address] [place], [place] [address]    Report Status 09/04/2019 FINAL  Final         Radiology Studies: ECHOCARDIOGRAM COMPLETE  Result Date: 09/03/2019    ECHOCARDIOGRAM REPORT   Patient Name:   HARISON OLESKY Date of Exam: 09/03/2019 Medical Rec #:  [id number]    Height:       71.0 in Accession #:    [id number]   Weight:       163.4 lb Date of Birth:  1950-12-25    BSA:          1.935 m Patient Age:    70 years     BP:           152/48 mmHg Patient Gender: M            HR:           78 bpm. Exam Location:  [facility] Procedure: 2D Echo, Color Doppler and Cardiac Doppler Indications:     R78.81 Bacteremia  History:         Patient has prior history of Echocardiogram examinations. CHF,                  Previous Myocardial Infarction and CAD, Stroke and PVD, ESRD;                  Risk Factors:Hypertension, Dyslipidemia and Diabetes.  Sonographer:     Charmayne Sheer RDCS (AE) Referring Phys:  [id number] Floyce Stakes GRIFFITH Diagnosing Phys: Ida Rogue MD  Sonographer Comments: Suboptimal apical window. IMPRESSIONS  1. Left ventricular ejection fraction, by estimation, is 55 to 60%. The left ventricle has normal function. The left ventricle has no regional wall motion abnormalities. There is moderate left ventricular hypertrophy. Left ventricular diastolic parameters are consistent with Grade I diastolic dysfunction (impaired relaxation).  2. Right ventricular systolic function is moderately reduced. The right ventricular size is moderate to severely  enlarged. Tricuspid regurgitation signal is inadequate for assessing PA pressure. Septal flattening ande RV dilation concerning for elevated  RV pressure and volume.  3. Left atrial size was mildly dilated.  4. Right atrial size was moderately dilated.  5. Tricuspid valve regurgitation is mild to moderate.  6. The aortic valve has heavy sclerosis, Very mild aortic valve stenosis.  7. The inferior vena cava is normal in size with greater than 50% respiratory variability, suggesting right atrial pressure of 3 mmHg.  8. No valve vegetation noted. FINDINGS  Left Ventricle: Left ventricular ejection fraction, by estimation, is 55 to 60%. The left ventricle has normal function. The left ventricle has no regional wall motion abnormalities. The left ventricular internal cavity size was normal in size. There is  moderate left ventricular hypertrophy. Left ventricular diastolic parameters are consistent with Grade I diastolic dysfunction (impaired relaxation). Right Ventricle: The right ventricular size is moderately enlarged. No increase in right ventricular wall thickness. Right ventricular systolic function is moderately reduced. Tricuspid regurgitation signal is inadequate for assessing PA pressure. Left Atrium: Left atrial size was mildly dilated. Right Atrium: Right atrial size was  moderately dilated. Pericardium: There is no evidence of pericardial effusion. Mitral Valve: The mitral valve is normal in structure and function. Normal mobility of the mitral valve leaflets. No evidence of mitral valve regurgitation. No evidence of mitral valve stenosis. MV peak gradient, 5.3 mmHg. The mean mitral valve gradient is 3.0 mmHg. Tricuspid Valve: The tricuspid valve is normal in structure. Tricuspid valve regurgitation is mild to moderate. No evidence of tricuspid stenosis. Aortic Valve: The aortic valve is normal in structure and function. Aortic valve regurgitation is not visualized. Mild aortic stenosis is present. Aortic  valve mean gradient measures 8.0 mmHg. Aortic valve peak gradient measures 17.1 mmHg. Aortic valve area, by VTI measures 1.29 cm. Pulmonic Valve: The pulmonic valve was normal in structure. Pulmonic valve regurgitation is not visualized. No evidence of pulmonic stenosis. Aorta: The aortic root is normal in size and structure. Venous: The inferior vena cava is normal in size with greater than 50% respiratory variability, suggesting right atrial pressure of 3 mmHg. IAS/Shunts: No atrial level shunt detected by color flow Doppler.  LEFT VENTRICLE PLAX 2D LVIDd:         3.77 cm  Diastology LVIDs:         2.05 cm  LV e' lateral:   10.00 cm/s LV PW:         1.40 cm  LV E/e' lateral: 9.9 LV IVS:        0.76 cm  LV e' medial:    5.98 cm/s LVOT diam:     1.80 cm  LV E/e' medial:  16.6 LV SV:         46 LV SV Index:   24 LVOT Area:     2.54 cm  RIGHT VENTRICLE RV Basal diam:  3.39 cm LEFT ATRIUM             Index       RIGHT ATRIUM           Index LA diam:        4.10 cm 2.12 cm/m  RA Area:     18.40 cm LA Vol (A2C):   67.9 ml 35.09 ml/m RA Volume:   47.50 ml  24.55 ml/m LA Vol (A4C):   86.8 ml 44.86 ml/m LA Biplane Vol: 80.7 ml 41.71 ml/m  AORTIC VALVE                    PULMONIC VALVE AV Area (Vmax):    1.24 cm     PV Vmax:       0.84 m/s AV Area (Vmean):   1.39 cm     PV Vmean:      46.600 cm/s AV Area (VTI):     1.29 cm     PV VTI:        0.124 m AV Vmax:           207.00 cm/s  PV Peak grad:  2.9 mmHg AV Vmean:          123.000 cm/s PV Mean grad:  1.0 mmHg AV VTI:            0.353 m AV Peak Grad:      17.1 mmHg AV Mean Grad:      8.0 mmHg LVOT Vmax:         101.00 cm/s LVOT Vmean:        67.300 cm/s LVOT VTI:          0.179 m LVOT/AV VTI ratio: 0.51  AORTA  Ao Root diam: 2.90 cm MITRAL VALVE MV Area (PHT): 3.37 cm     SHUNTS MV Peak grad:  5.3 mmHg     Systemic VTI:  0.18 m MV Mean grad:  3.0 mmHg     Systemic Diam: 1.80 cm MV Vmax:       1.15 m/s MV Vmean:      82.2 cm/s MV Decel Time: 225 msec MV E velocity:  99.00 cm/s MV A velocity: 107.00 cm/s MV E/A ratio:  0.93 Ida Rogue MD Electronically signed by Ida Rogue MD Signature Date/Time: 09/03/2019/11:41:02 AM    Final         Scheduled Meds: . atorvastatin  20 mg Oral q1800  . cholecalciferol  1,000 Units Oral q1800  . epoetin (EPOGEN/PROCRIT) injection  4,000 Units Intravenous Q T,Th,Sa-HD  . feeding supplement (NEPRO CARB STEADY)  237 mL Oral BID BM  . ferrous sulfate  325 mg Oral q1800  . gabapentin  100 mg Oral QPM  . heparin injection (subcutaneous)  5,000 Units Subcutaneous Q8H  . insulin aspart  0-15 Units Subcutaneous Q4H  . mouth rinse  15 mL Mouth Rinse BID  . multivitamin  1 tablet Oral QHS  . multivitamin with minerals  1 tablet Oral QHS  . polyvinyl alcohol  1 drop Left Eye QID  . sertraline  100 mg Oral QHS  . sodium chloride flush  10-40 mL Intracatheter Q12H   Continuous Infusions: . sodium chloride Stopped (09/01/19 1601)  . vancomycin Stopped (09/03/19 1604)    Assessment & Plan:   Principal Problem:   Gangrenous toe ([place]) Active Problems:   Hypertension   ESRD (end stage renal disease) (HCC)   Chronic systolic heart failure (HCC)   Hypotension   PVD (peripheral vascular disease) (HCC)   Hx of BKA, right (HCC)   Severe sepsis (HCC)   Gangrene of left foot ([place])   Sepsis ([place])   Advanced care planning/counseling discussion   Goals of care, counseling/discussion   Palliative care by specialist   Severe sepsissecondary to gangrenous toe-present on admission as evidenced by fever, hypotension requiring vasopressor(due to ESRD to prevent volume overload)and infected gangrenous toe.  --vasopressor if needed to maintain MAP>65  Gangrenous toe-present on admission, left second toe.  Due to severe PVD. Patient is status post right BKA previously. Underwent left AKA on 2/27 postop day #3. --Vascular surgeryfollowing-recommend daily dressing changes.  Stump healthy. - Zosyn was d/c'd and  monitor clinically, cont Vanc for +blood culture as below --ID following --pain management and bowel regimen  Coagulase-negative Staph Positive Blood culture - on initial cultures, likely contaminate --repeat blood cultures pending, follow up --TTE negative for vegetations --ID recommends 2 weeks IV Vancomycin with dialysis if culture negative and TTE negative for vegetations -need to d/c femoral cath if possible  Acute Encephalopathy, likely septic/infectious due to above. Present on admission. Resolved.  Hypertension- with hypotension on admission.  Of note - BPcan only be checked on his LLE,given right BKA and bilateral upper extremity AV fistulas. bp elevated now, will resume home beta blocker  ESRD (end stage renal disease) --nephrology following for dialysis  Chronic systolic heart failure- currently hypovolemic.  --beta blockers will resume   Hx ofRightBKA- due to severe PVD. Residual distal limb appears healthy.  PVD (peripheral vascular disease)- with priorright BKA, this admission had left AKA.  --holdASA and Plavixuntil okay to resume per vascular- will ask.  Anemia of chronic disease-secondary to ESRD. No evidence of active blood loss. --Monitor CBC  DVT prophylaxis:heparin Code Status: Full Code Family Communication:Daughter updated  Disposition Plan:Anticipate return to [facility], possibly in a.m.  Checking Covid testing which is pending.  Pending stability and clearance by vascular surgery.    Also Patient will need to be able to sit in a chair for dialysis prior to discharge. Coming FromSNF Exp DC Date3/4 Barriersas above Medically Stable for Discharge?No        LOS: 7 days   Time spent: 45 minutes with more than 50% COC    Nolberto Hanlon, MD [facility] Pager [phone]  If 7PM-7AM, please contact night-coverage [email or web address] Password [facility] 09/04/2019, 3:39 PM

## 2019-09-05 LAB — GLUCOSE, CAPILLARY
Glucose-Capillary: 103 mg/dL — ABNORMAL HIGH (ref 70–99)
Glucose-Capillary: 108 mg/dL — ABNORMAL HIGH (ref 70–99)
Glucose-Capillary: 123 mg/dL — ABNORMAL HIGH (ref 70–99)
Glucose-Capillary: 124 mg/dL — ABNORMAL HIGH (ref 70–99)
Glucose-Capillary: 72 mg/dL (ref 70–99)

## 2019-09-05 LAB — CBC
HCT: 29.1 % — ABNORMAL LOW (ref 39.0–52.0)
Hemoglobin: 9.3 g/dL — ABNORMAL LOW (ref 13.0–17.0)
MCH: 26.8 pg (ref 26.0–34.0)
MCHC: 32 g/dL (ref 30.0–36.0)
MCV: 83.9 fL (ref 80.0–100.0)
Platelets: 299 10*3/uL (ref 150–400)
RBC: 3.47 MIL/uL — ABNORMAL LOW (ref 4.22–5.81)
RDW: 18 % — ABNORMAL HIGH (ref 11.5–15.5)
WBC: 13.6 10*3/uL — ABNORMAL HIGH (ref 4.0–10.5)
nRBC: 2.3 % — ABNORMAL HIGH (ref 0.0–0.2)

## 2019-09-05 LAB — BASIC METABOLIC PANEL
Anion gap: 16 — ABNORMAL HIGH (ref 5–15)
BUN: 36 mg/dL — ABNORMAL HIGH (ref 8–23)
CO2: 26 mmol/L (ref 22–32)
Calcium: 8.2 mg/dL — ABNORMAL LOW (ref 8.9–10.3)
Chloride: 95 mmol/L — ABNORMAL LOW (ref 98–111)
Creatinine, Ser: 6.77 mg/dL — ABNORMAL HIGH (ref 0.61–1.24)
GFR calc Af Amer: 9 mL/min — ABNORMAL LOW (ref >60)
GFR calc non Af Amer: 8 mL/min — ABNORMAL LOW (ref >60)
Glucose, Bld: 99 mg/dL (ref 70–99)
Potassium: 3.3 mmol/L — ABNORMAL LOW (ref 3.5–5.1)
Sodium: 137 mmol/L (ref 135–145)

## 2019-09-05 LAB — VANCOMYCIN, RANDOM: Vancomycin Rm: 29

## 2019-09-05 LAB — SARS CORONAVIRUS 2 (TAT 6-24 HRS): SARS Coronavirus 2: NEGATIVE

## 2019-09-05 MED ORDER — POLYETHYLENE GLYCOL 3350 17 G PO PACK: 17.0000 g | PACK | Freq: Every day | ORAL | 0 refills | Status: AC | PRN

## 2019-09-05 MED ORDER — EPOETIN ALFA 10000 UNIT/ML IJ SOLN: 4000.0000 [IU] | INTRAMUSCULAR | Status: AC

## 2019-09-05 MED ORDER — BISACODYL 5 MG PO TBEC: 5.0000 mg | DELAYED_RELEASE_TABLET | Freq: Every day | ORAL | 0 refills | Status: AC | PRN

## 2019-09-05 MED ORDER — NEPRO/CARBSTEADY PO LIQD: 237.0000 mL | Freq: Two times a day (BID) | ORAL | 0 refills | Status: AC

## 2019-09-05 MED ORDER — VANCOMYCIN HCL 500 MG/100ML IV SOLN: 500.0000 mg | INTRAVENOUS | Status: AC

## 2019-09-05 MED ORDER — HEPARIN SODIUM (PORCINE) 5000 UNIT/ML IJ SOLN: 5000.0000 [IU] | Freq: Three times a day (TID) | INTRAMUSCULAR | Status: AC

## 2019-09-05 MED ORDER — VANCOMYCIN HCL 500 MG/100ML IV SOLN
500.0000 mg | INTRAVENOUS | Status: DC
  Administered 2019-09-05: 750 mg via INTRAVENOUS
  Filled 2019-09-05: qty 100

## 2019-09-05 MED ORDER — BLISTEX MEDICATED EX OINT: 1.0000 "application " | TOPICAL_OINTMENT | CUTANEOUS | Status: AC | PRN

## 2019-09-05 NOTE — Discharge Summary (Signed)
Dennis Walker [id number] DOB: [birth year] DOA: 08/28/2019  PCP: Juluis Pitch, MD  Admit date: 08/28/2019 Discharge date: 09/05/2019  [facility]: [facility]  Recommendations for Outpatient Follow-up:  1. Follow up with PCP in 1 week 2. Please obtain BMP/CBC in one week 3. Vascular surgery Eulogio Ditch, NP in 2 weeks 4. Follow-up Adrian Prows infectious disease in 2 weeks 5. Daily dressing changes     Discharge Condition:Stable CODE STATUS: Full code Diet recommendation: Renal diet, fluid restriction 1200 Brief/Interim Summary: Dennis H Dixonis an 69 y.o.malewithextensive pastmedical historyincludinghx of CAD s/p PCI to the proximal LAD, chronic HFpEF, stroke, PAD s/p right BKA, ESRD on HD Tues/Thurs/Sat,type 2 diabetes, childhood polio,blindnessand asthma who [facility]. The toe had become darker and malodorous over few days prior. Patient was followed closely outpatient by vascular surgery for severe PVD. In the ED, febrile T-max 101F,and hypotensive. Labs showed leukocytosis of 17.6k and stable anemia. He was treated with broad-spectrum antibiotics and required admission to ICU for vasopressors.He wasweaned offofpressorsthe following morningand care was transferred to the hospitalist service.  He was found with severe sepsis secondary to gangrenous toes.  He underwent a left AKA on 2/27 which he is postop day 4 today.  Vascular followed the patient and recommended daily dressing changes and they felt the stump was healthy.  He was on Zosyn which was discontinued.  He was continued on vancomycin for Staph capitis blood culture.  Subsequent cultures were negative.  He went to echocardiogram that was negative for vegetation.  Infectious disease was consulted and they recommended 2 weeks of IV vancomycin with dialysis which the last dose would be September 14, 2019.   He had a femoral triple-lumen cath placed which was discontinued today.  On admission he had acute encephalopathy due to infection and sepsis which resolved.  His blood pressure medications were resumed once he was hemodynamically stable.  Today he had dialysis.  He is to follow-up with his regular dialysis on Saturday.  He is stable to be [facility].  He does have leukocytosis of WBC of 13 however his been afebrile.  Subsequent cultures have been negative to date.  Discharge Diagnoses:  Principal Problem:   Gangrenous toe ([place]) Active Problems:   Hypertension   ESRD (end stage renal disease) (HCC)   Chronic systolic heart failure (HCC)   Hypotension   PVD (peripheral vascular disease) (HCC)   Hx of BKA, right (HCC)   Severe sepsis ([place])   Gangrene of left foot ([place])   Sepsis ([place])   Advanced care planning/counseling discussion   Goals of care, counseling/discussion   Palliative care by specialist    Discharge Instructions  Discharge Instructions    Call MD for:  temperature >100.4   Complete by: As directed    Diet - low sodium heart healthy   Complete by: As directed    Increase activity slowly   Complete by: As directed      Allergies as of 09/05/2019      Reactions   Cefuroxime Itching   Shrimp [shellfish Allergy] Other (See Comments)   Per mar. Unknown reaction   Sulfa Antibiotics Itching, Rash      Medication List    STOP taking these medications   ibuprofen 200 MG tablet Commonly known as: ADVIL   oxyCODONE 5 MG immediate release tablet Commonly known as: Oxy IR/ROXICODONE     TAKE these medications  acetaminophen 500 MG tablet Commonly known as: TYLENOL Take 500 mg by mouth every 4 (four) hours as needed for mild pain or fever.   albuterol 108 (90 Base) MCG/ACT inhaler Commonly known as: VENTOLIN HFA Inhale 2 puffs into the lungs every 4 (four) hours as needed for wheezing or shortness of breath.   aspirin EC 81 MG tablet Take  1 tablet (81 mg total) by mouth daily. What changed: when to take this   atorvastatin 10 MG tablet Commonly known as: LIPITOR Take 20 mg by mouth daily at 6 PM.   b complex vitamins tablet Take 1 tablet by mouth daily at 6 PM.   bisacodyl 5 MG EC tablet Commonly known as: DULCOLAX Take 1 tablet (5 mg total) by mouth daily as needed for moderate constipation.   clopidogrel 75 MG tablet Commonly known as: PLAVIX Take 75 mg by mouth daily at 6 PM.   D3-1000 25 MCG (1000 UT) tablet Generic drug: Cholecalciferol Take 1,000 Units by mouth daily at 6 PM.   epoetin alfa 10000 UNIT/ML injection Commonly known as: EPOGEN Inject 0.4 mLs (4,000 Units total) into the vein Every Tuesday,Thursday,and Saturday with dialysis.   eucerin cream Apply 1 application topically 2 (two) times daily as needed (rash).   feeding supplement (NEPRO CARB STEADY) Liqd Take 237 mLs by mouth 2 (two) times daily between meals.   ferrous sulfate 325 (65 FE) MG tablet Take 325 mg by mouth daily at 6 PM.   gabapentin 100 MG capsule Commonly known as: NEURONTIN Take 1 capsule (100 mg total) by mouth every evening. What changed:   how much to take  when to take this   heparin 5000 UNIT/ML injection Inject 1 mL (5,000 Units total) into the skin every 8 (eight) hours.   lactulose 10 GM/15ML solution Commonly known as: CHRONULAC Take 20 g by mouth at bedtime.   lidocaine-prilocaine cream Commonly known as: EMLA Apply 1 application topically Every Tuesday,Thursday,and Saturday with dialysis.   linagliptin 5 MG Tabs tablet Commonly known as: TRADJENTA Take 5 mg by mouth daily.   lip balm Oint Apply 1 application topically as needed for lip care.   lisinopril 10 MG tablet Commonly known as: ZESTRIL Take 1 tablet (10 mg total) by mouth daily at 6 PM. What changed: how much to take   metoprolol succinate 25 MG 24 hr tablet Commonly known as: TOPROL-XL Take 25 mg by mouth daily at 6 PM.    multivitamin tablet Take 1 tablet by mouth at bedtime.   nitroGLYCERIN 0.4 MG SL tablet Commonly known as: NITROSTAT Place 0.4 mg under the tongue every 5 (five) minutes as needed for chest pain.   Orajel Maximum Strength 20 % Gel Generic drug: benzocaine Use as directed 1 application in the mouth or throat 4 (four) times daily as needed (pain).   oxyCODONE-acetaminophen 5-325 MG tablet Commonly known as: Percocet Take 1 tablet by mouth every 6 (six) hours as needed for severe pain.   polyethylene glycol 17 g packet Commonly known as: MIRALAX / GLYCOLAX Take 17 g by mouth daily as needed for moderate constipation or severe constipation. What changed:   when to take this  reasons to take this   Polyvinyl Alcohol-Povidone 5-6 MG/ML Soln Place 1 drop into the left eye 4 (four) times daily.   saccharomyces boulardii 250 MG capsule Commonly known as: FLORASTOR Take 250 mg by mouth daily.   sertraline 100 MG tablet Commonly known as: ZOLOFT Take 100 mg by mouth  at bedtime.   sevelamer carbonate 800 MG tablet Commonly known as: RENVELA Take 800 mg by mouth 2 (two) times daily. (with snacks)   sevelamer carbonate 800 MG tablet Commonly known as: RENVELA Take 1,600 mg by mouth 3 (three) times daily with meals.   Systane Ultra PF 0.4-0.3 % Soln Generic drug: Polyethyl Glyc-Propyl Glyc PF Place 1 drop into the left eye 4 (four) times daily.   vancomycin 500 MG/100ML IVPB Commonly known as: VANCOREADY Inject 100 mLs (500 mg total) into the vein Every Tuesday,Thursday,and Saturday with dialysis. Start taking on: September 07, 2019      Follow-up Information    Kris Hartmann, NP Follow up in 2 week(s).   Specialty: Vascular Surgery Why: First post-op incision check. No studies needed.  Contact information: [place] [address] [phone]        Leonel Ramsay, MD Follow up in 2 week(s).   Specialty: Infectious Diseases Contact  information: [place] [address] [phone]        Juluis Pitch, MD Follow up in 1 week(s).   Specialty: Family Medicine Contact information: [place] [address] [phone]          Allergies  Allergen Reactions  . Cefuroxime Itching  . Shrimp [Shellfish Allergy] Other (See Comments)    Per mar. Unknown reaction   . Sulfa Antibiotics Itching and Rash    Consultations:  Infectious disease, vascular surgery, nephrology   Procedures/Studies: PERIPHERAL VASCULAR CATHETERIZATION  Result Date: 08/30/2019 See op note  DG Chest Port 1 View  Result Date: 08/28/2019 CLINICAL DATA:  Possible left foot infection with skin changes. EXAM: PORTABLE CHEST 1 VIEW COMPARISON:  Radiographs 02/16/2018 and 01/23/2018. FINDINGS: 1758 hours. Stable mild cardiomegaly and aortic atherosclerosis. There is a left subclavian vascular stent. Mild interstitial prominence in both lungs appears unchanged. No evidence of superimposed edema, confluent airspace opacity, pneumothorax or significant pleural effusion. The bones appear unchanged. Telemetry leads overlie the chest. IMPRESSION: Stable chest with mild cardiomegaly and chronic interstitial prominence. No evidence of pneumonia. Electronically Signed   By: Richardean Sale M.D.   On: 08/28/2019 18:36   DG Foot Complete Left  Result Date: 08/28/2019 CLINICAL DATA:  Possible left foot infection. Progressive symptoms over the last 5 days with skin darkening of the toes and foul odor. EXAM: LEFT FOOT - COMPLETE 3+ VIEW COMPARISON:  None. FINDINGS: The bones appear adequately mineralized. There is no evidence of acute fracture or dislocation. The soft tissues of the 2nd toe appear atretic with possible exposure of the distal tuft. There are possible erosions of the distal tuft of the 2nd digit as well as the great toe. No other evidence of bone destruction. Extensive vascular  calcifications are noted. There is mild dorsal forefoot soft tissue swelling without soft tissue emphysema or foreign body. IMPRESSION: Atretic soft tissues of the 2nd toe suspicious for infarction. Possible tuft erosions of the distal 1st and 2nd digits. These findings could reflect early osteomyelitis. Extensive vascular calcifications. Electronically Signed   By: Richardean Sale M.D.   On: 08/28/2019 18:40   ECHOCARDIOGRAM COMPLETE  Result Date: 09/03/2019    ECHOCARDIOGRAM REPORT   Patient Name:   WILGUS SWEEN Date of Exam: 09/03/2019 Medical Rec #:  [id number]    Height:       71.0 in Accession #:    [id number]   Weight:       163.4 lb  Date of Birth:  02/27/1951    BSA:          1.935 m Patient Age:    69 years     BP:           152/48 mmHg Patient Gender: M            HR:           78 bpm. Exam Location:  [facility] Procedure: 2D Echo, Color Doppler and Cardiac Doppler Indications:     R78.81 Bacteremia  History:         Patient has prior history of Echocardiogram examinations. CHF,                  Previous Myocardial Infarction and CAD, Stroke and PVD, ESRD;                  Risk Factors:Hypertension, Dyslipidemia and Diabetes.  Sonographer:     Charmayne Sheer RDCS (AE) Referring Phys:  [id number] Floyce Stakes GRIFFITH Diagnosing Phys: Ida Rogue MD  Sonographer Comments: Suboptimal apical window. IMPRESSIONS  1. Left ventricular ejection fraction, by estimation, is 55 to 60%. The left ventricle has normal function. The left ventricle has no regional wall motion abnormalities. There is moderate left ventricular hypertrophy. Left ventricular diastolic parameters are consistent with Grade I diastolic dysfunction (impaired relaxation).  2. Right ventricular systolic function is moderately reduced. The right ventricular size is moderate to severely enlarged. Tricuspid regurgitation signal is inadequate for assessing PA pressure. Septal flattening ande RV dilation concerning for elevated  RV pressure and volume.  3. Left  atrial size was mildly dilated.  4. Right atrial size was moderately dilated.  5. Tricuspid valve regurgitation is mild to moderate.  6. The aortic valve has heavy sclerosis, Very mild aortic valve stenosis.  7. The inferior vena cava is normal in size with greater than 50% respiratory variability, suggesting right atrial pressure of 3 mmHg.  8. No valve vegetation noted. FINDINGS  Left Ventricle: Left ventricular ejection fraction, by estimation, is 55 to 60%. The left ventricle has normal function. The left ventricle has no regional wall motion abnormalities. The left ventricular internal cavity size was normal in size. There is  moderate left ventricular hypertrophy. Left ventricular diastolic parameters are consistent with Grade I diastolic dysfunction (impaired relaxation). Right Ventricle: The right ventricular size is moderately enlarged. No increase in right ventricular wall thickness. Right ventricular systolic function is moderately reduced. Tricuspid regurgitation signal is inadequate for assessing PA pressure. Left Atrium: Left atrial size was mildly dilated. Right Atrium: Right atrial size was moderately dilated. Pericardium: There is no evidence of pericardial effusion. Mitral Valve: The mitral valve is normal in structure and function. Normal mobility of the mitral valve leaflets. No evidence of mitral valve regurgitation. No evidence of mitral valve stenosis. MV peak gradient, 5.3 mmHg. The mean mitral valve gradient is 3.0 mmHg. Tricuspid Valve: The tricuspid valve is normal in structure. Tricuspid valve regurgitation is mild to moderate. No evidence of tricuspid stenosis. Aortic Valve: The aortic valve is normal in structure and function. Aortic valve regurgitation is not visualized. Mild aortic stenosis is present. Aortic valve mean gradient measures 8.0 mmHg. Aortic valve peak gradient measures 17.1 mmHg. Aortic valve area, by VTI measures 1.29 cm. Pulmonic Valve: The pulmonic valve was normal  in structure. Pulmonic valve regurgitation is not visualized. No evidence of pulmonic stenosis. Aorta: The aortic root is normal in size and structure. Venous: The inferior vena cava is normal  in size with greater than 50% respiratory variability, suggesting right atrial pressure of 3 mmHg. IAS/Shunts: No atrial level shunt detected by color flow Doppler.  LEFT VENTRICLE PLAX 2D LVIDd:         3.77 cm  Diastology LVIDs:         2.05 cm  LV e' lateral:   10.00 cm/s LV PW:         1.40 cm  LV E/e' lateral: 9.9 LV IVS:        0.76 cm  LV e' medial:    5.98 cm/s LVOT diam:     1.80 cm  LV E/e' medial:  16.6 LV SV:         46 LV SV Index:   24 LVOT Area:     2.54 cm  RIGHT VENTRICLE RV Basal diam:  3.39 cm LEFT ATRIUM             Index       RIGHT ATRIUM           Index LA diam:        4.10 cm 2.12 cm/m  RA Area:     18.40 cm LA Vol (A2C):   67.9 ml 35.09 ml/m RA Volume:   47.50 ml  24.55 ml/m LA Vol (A4C):   86.8 ml 44.86 ml/m LA Biplane Vol: 80.7 ml 41.71 ml/m  AORTIC VALVE                    PULMONIC VALVE AV Area (Vmax):    1.24 cm     PV Vmax:       0.84 m/s AV Area (Vmean):   1.39 cm     PV Vmean:      46.600 cm/s AV Area (VTI):     1.29 cm     PV VTI:        0.124 m AV Vmax:           207.00 cm/s  PV Peak grad:  2.9 mmHg AV Vmean:          123.000 cm/s PV Mean grad:  1.0 mmHg AV VTI:            0.353 m AV Peak Grad:      17.1 mmHg AV Mean Grad:      8.0 mmHg LVOT Vmax:         101.00 cm/s LVOT Vmean:        67.300 cm/s LVOT VTI:          0.179 m LVOT/AV VTI ratio: 0.51  AORTA Ao Root diam: 2.90 cm MITRAL VALVE MV Area (PHT): 3.37 cm     SHUNTS MV Peak grad:  5.3 mmHg     Systemic VTI:  0.18 m MV Mean grad:  3.0 mmHg     Systemic Diam: 1.80 cm MV Vmax:       1.15 m/s MV Vmean:      82.2 cm/s MV Decel Time: 225 msec MV E velocity: 99.00 cm/s MV A velocity: 107.00 cm/s MV E/A ratio:  0.93 Ida Rogue MD Electronically signed by Ida Rogue MD Signature Date/Time: 09/03/2019/11:41:02 AM    Final         Subjective: Patient seen and examined in hemodialysis.  He is tolerating dialysis.  He has no complaints.  He reports feeling better today.  Discharge Exam: Vitals:   09/05/19 1215 09/05/19 1230  BP: (!) 163/51 (!) 168/51  Pulse: 83 83  Resp: 14  15  Temp:    SpO2:     Vitals:   09/05/19 1145 09/05/19 1200 09/05/19 1215 09/05/19 1230  BP: (!) 168/37 (!) 173/27 (!) 163/51 (!) 168/51  Pulse: 82 83 83 83  Resp: 16 14 14 15   Temp:      TempSrc:      SpO2:      Weight:      Height:        General: Pt is alert, awake, not in acute distress Cardiovascular: RRR, S1/S2 +, no rubs, no gallops Respiratory: CTA bilaterally, no wheezing, no rhonchi Abdominal: Soft, NT, ND, bowel sounds + Extremities: no edema, no cyanosis    The results of significant diagnostics from this hospitalization (including imaging, microbiology, ancillary and laboratory) are listed below for reference.     Microbiology: Recent Results (from the past 240 hour(s))  Blood Culture (routine x 2)     Status: Abnormal (Preliminary result)   Collection Time: 08/28/19  5:46 PM   Specimen: BLOOD  Result Value Ref Range Status   Specimen Description   Final    BLOOD RESIDENT Performed at [facility], [address], [place], [place] [address]    Special Requests   Final    BOTTLES DRAWN AEROBIC AND ANAEROBIC Blood Culture results may not be optimal due to an inadequate volume of blood received in culture bottles Performed at [facility], [address] [place], [place] [address]    Culture  Setup Time   Final    GRAM POSITIVE COCCI AEROBIC BOTTLE ONLY CRITICAL RESULT CALLED TO, READ BACK BY AND VERIFIED WITH: JASON ROBBINS 08/29/19 @ 1803  [place] Performed at [facility], [address], [place], [place] [address]    Culture (A)  Final    STAPHYLOCOCCUS CAPITIS SUSCEPTIBILITIES TO FOLLOW Performed at [facility], [place] [address] [place], [place] [address]     Report Status PENDING  Incomplete  Respiratory Panel by RT PCR (Flu A&B, Covid) - Nasopharyngeal Swab     Status: None   Collection Time: 08/28/19  7:32 PM   Specimen: Nasopharyngeal Swab  Result Value Ref Range Status   SARS Coronavirus 2 by RT PCR NEGATIVE NEGATIVE Final    Comment: (NOTE) SARS-CoV-2 target nucleic acids are NOT DETECTED. The SARS-CoV-2 RNA is generally detectable in upper respiratoy specimens during the acute phase of infection. The lowest concentration of SARS-CoV-2 viral copies this assay can detect is 131 copies/mL. A negative result does not preclude SARS-Cov-2 infection and should not be used as the sole basis for treatment or other patient management decisions. A negative result may occur with  improper specimen collection/handling, submission of specimen other than nasopharyngeal swab, presence of viral mutation(s) within the areas targeted by this assay, and inadequate number of viral copies (<131 copies/mL). A negative result must be combined with clinical observations, patient history, and epidemiological information. The expected result is Negative. Fact Sheet for Patients:  PinkCheek.be Fact Sheet for Healthcare Providers:  GravelBags.it This test is not yet ap proved or cleared by the [place] FDA and  has been authorized for detection and/or diagnosis of SARS-CoV-2 by FDA under an Emergency Use Authorization (EUA). This EUA will remain  in effect (meaning this test can be used) for the duration of the COVID-19 declaration under Section 564(b)(1) of the Act, 21 U.S.C. section 360bbb-3(b)(1), unless the authorization is terminated or revoked sooner.    Influenza A by PCR NEGATIVE NEGATIVE Final   Influenza  B by PCR NEGATIVE NEGATIVE Final    Comment: (NOTE) The Xpert Xpress SARS-CoV-2/FLU/RSV assay is intended as an aid in  the diagnosis of influenza from Nasopharyngeal swab specimens  and  should not be used as a sole basis for treatment. Nasal washings and  aspirates are unacceptable for Xpert Xpress SARS-CoV-2/FLU/RSV  testing. Fact Sheet for Patients: PinkCheek.be Fact Sheet for Healthcare Providers: GravelBags.it This test is not yet approved or cleared by the [place] FDA and  has been authorized for detection and/or diagnosis of SARS-CoV-2 by  FDA under an Emergency Use Authorization (EUA). This EUA will remain  in effect (meaning this test can be used) for the duration of the  Covid-19 declaration under Section 564(b)(1) of the Act, 21  U.S.C. section 360bbb-3(b)(1), unless the authorization is  terminated or revoked. Performed at [facility], [address] [place], [place] [address]   MRSA PCR Screening     Status: None   Collection Time: 08/29/19 12:16 AM   Specimen: Foot, Left; Nasopharyngeal  Result Value Ref Range Status   MRSA by PCR NEGATIVE NEGATIVE Final    Comment:        The GeneXpert MRSA Assay (FDA approved for NASAL specimens only), is one component of a comprehensive MRSA colonization surveillance program. It is not intended to diagnose MRSA infection nor to guide or monitor treatment for MRSA infections. Performed at [facility], [address] [place], [place] [address]   Blood Culture (routine x 2)     Status: None   Collection Time: 08/29/19  1:31 AM   Specimen: Leg; Blood  Result Value Ref Range Status   Specimen Description LEG  Final   Special Requests Blood Culture adequate volume  Final   Culture   Final    NO GROWTH 5 DAYS Performed at [facility], [address], [place], [place] [address]    Report Status 09/03/2019 FINAL  Final  Culture, blood (single) w Reflex to ID Panel     Status: None   Collection Time: 08/30/19  6:12 PM   Specimen: BLOOD  Result Value Ref Range Status   Specimen Description BLOOD RAC  Final    Special Requests   Final    BOTTLES DRAWN AEROBIC AND ANAEROBIC Blood Culture adequate volume   Culture   Final    NO GROWTH 5 DAYS Performed at [facility], [address], [place], [place] [address]    Report Status 09/04/2019 FINAL  Final  Culture, blood (single) w Reflex to ID Panel     Status: None   Collection Time: 08/30/19 10:27 PM   Specimen: BLOOD  Result Value Ref Range Status   Specimen Description BLOOD RIGHT ANTECUBITAL  Final   Special Requests   Final    BOTTLES DRAWN AEROBIC AND ANAEROBIC Blood Culture adequate volume   Culture   Final    NO GROWTH 5 DAYS Performed at [facility], [address] [place], [place] [address]    Report Status 09/04/2019 FINAL  Final  SARS CORONAVIRUS 2 (TAT 6-24 HRS) Nasopharyngeal Nasopharyngeal Swab     Status: None   Collection Time: 09/04/19  8:37 PM   Specimen: Nasopharyngeal Swab  Result Value Ref Range Status   SARS Coronavirus 2 NEGATIVE NEGATIVE Final    Comment: (NOTE) SARS-CoV-2 target nucleic acids are NOT DETECTED. The SARS-CoV-2 RNA is generally detectable in upper and lower respiratory specimens during the acute phase of infection. Negative results do not  preclude SARS-CoV-2 infection, do not rule out co-infections with other pathogens, and should not be used as the sole basis for treatment or other patient management decisions. Negative results must be combined with clinical observations, patient history, and epidemiological information. The expected result is Negative. Fact Sheet for Patients: SugarRoll.be Fact Sheet for Healthcare Providers: https://www.woods-mathews.com/ This test is not yet approved or cleared by the [place] FDA and  has been authorized for detection and/or diagnosis of SARS-CoV-2 by FDA under an Emergency Use Authorization (EUA). This EUA will remain  in effect (meaning this test can be used) for the duration of  the COVID-19 declaration under Section 56 4(b)(1) of the Act, 21 U.S.C. section 360bbb-3(b)(1), unless the authorization is terminated or revoked sooner. Performed at [facility], [place] [address] [place], [place] [address]      Labs: BNP (last 3 results) No results for input(s): BNP in the last 8760 hours. Basic Metabolic Panel: Recent Labs  Lab 08/30/19 0511 08/30/19 0511 08/31/19 0437 09/01/19 0418 09/02/19 0551 09/03/19 1318 09/04/19 0500 09/05/19 0540  NA 140   < > 139 137 140  --  139 137  K 3.4*   < > 4.0 4.4 4.3  --  3.3* 3.3*  CL 99   < > 98 97* 97*  --  97* 95*  CO2 25   < > 27 25 24   --  26 26  GLUCOSE 115*   < > 190* 174* 126*  --  128* 99  BUN 31*   < > 46* 64* 81*  --  29* 36*  CREATININE 6.42*   < > 7.86* 9.04* 9.82*  --  5.28* 6.77*  CALCIUM 8.3*   < > 8.1* 8.2* 7.6*  --  8.0* 8.2*  MG 2.1  --   --   --   --   --   --   --   PHOS 3.2  --   --   --   --  2.6  --   --    < > = values in this interval not displayed.   Liver Function Tests: No results for input(s): AST, ALT, ALKPHOS, BILITOT, PROT, ALBUMIN in the last 168 hours. No results for input(s): LIPASE, AMYLASE in the last 168 hours. No results for input(s): AMMONIA in the last 168 hours. CBC: Recent Labs  Lab 08/30/19 0511 08/30/19 0511 08/31/19 0437 08/31/19 0830 09/01/19 0418 09/02/19 0551 09/03/19 0500 09/04/19 0500 09/05/19 0540  WBC 20.3*   < > 22.1*   < > 19.8* 14.4* 10.9* 12.6* 13.6*  NEUTROABS 17.5*  --  20.8*  --  18.0* 11.6*  --   --   --   HGB 9.9*   < > 10.9*   < > 9.8* 9.6* 9.5* 8.9* 9.3*  HCT 30.9*   < > 34.5*   < > 31.4* 28.9* 30.1* 28.2* 29.1*  MCV 85.1   < > 85.8   < > 84.2 81.4 85.0 85.5 83.9  PLT 248   < > 292   < > 297 278 268 280 299   < > = values in this interval not displayed.   Cardiac Enzymes: No results for input(s): CKTOTAL, CKMB, CKMBINDEX, TROPONINI in the last 168 hours. BNP: Invalid input(s): POCBNP CBG: Recent Labs  Lab 09/04/19 2101  09/05/19 0024 09/05/19 0509 09/05/19 0604 09/05/19 0752  GLUCAP 149* 124* 72 123* 108*   D-Dimer No results for input(s): DDIMER in the last 72 hours. Hgb  A1c No results for input(s): HGBA1C in the last 72 hours. Lipid Profile No results for input(s): CHOL, HDL, LDLCALC, TRIG, CHOLHDL, LDLDIRECT in the last 72 hours. Thyroid function studies No results for input(s): TSH, T4TOTAL, T3FREE, THYROIDAB in the last 72 hours.  Invalid input(s): FREET3 Anemia work up No results for input(s): VITAMINB12, FOLATE, FERRITIN, TIBC, IRON, RETICCTPCT in the last 72 hours. Urinalysis    Component Value Date/Time   COLORURINE AMBER (A) 12/24/2017 1052   APPEARANCEUR CLOUDY (A) 12/24/2017 1052   LABSPEC 1.022 12/24/2017 1052   PHURINE 5.0 12/24/2017 1052   GLUCOSEU NEGATIVE 12/24/2017 1052   HGBUR LARGE (A) 12/24/2017 1052   BILIRUBINUR NEGATIVE 12/24/2017 1052   KETONESUR NEGATIVE 12/24/2017 1052   PROTEINUR >=300 (A) 12/24/2017 1052   NITRITE NEGATIVE 12/24/2017 1052   LEUKOCYTESUR NEGATIVE 12/24/2017 1052   Sepsis Labs Invalid input(s): PROCALCITONIN,  WBC,  LACTICIDVEN Microbiology Recent Results (from the past 240 hour(s))  Blood Culture (routine x 2)     Status: Abnormal (Preliminary result)   Collection Time: 08/28/19  5:46 PM   Specimen: BLOOD  Result Value Ref Range Status   Specimen Description   Final    BLOOD RESIDENT Performed at [facility], [address] [place], [place] [address]    Special Requests   Final    BOTTLES DRAWN AEROBIC AND ANAEROBIC Blood Culture results may not be optimal due to an inadequate volume of blood received in culture bottles Performed at [facility], [address] [place], [place] [address]    Culture  Setup Time   Final    GRAM POSITIVE COCCI AEROBIC BOTTLE ONLY CRITICAL RESULT CALLED TO, READ BACK BY AND VERIFIED WITH: JASON ROBBINS 08/29/19 @ 1803  [place] Performed at [facility], [address]  [place], [place] [address]    Culture (A)  Final    STAPHYLOCOCCUS CAPITIS SUSCEPTIBILITIES TO FOLLOW Performed at [facility], [place] [address], [place], [place] [address]    Report Status PENDING  Incomplete  Respiratory Panel by RT PCR (Flu A&B, Covid) - Nasopharyngeal Swab     Status: None   Collection Time: 08/28/19  7:32 PM   Specimen: Nasopharyngeal Swab  Result Value Ref Range Status   SARS Coronavirus 2 by RT PCR NEGATIVE NEGATIVE Final    Comment: (NOTE) SARS-CoV-2 target nucleic acids are NOT DETECTED. The SARS-CoV-2 RNA is generally detectable in upper respiratoy specimens during the acute phase of infection. The lowest concentration of SARS-CoV-2 viral copies this assay can detect is 131 copies/mL. A negative result does not preclude SARS-Cov-2 infection and should not be used as the sole basis for treatment or other patient management decisions. A negative result may occur with  improper specimen collection/handling, submission of specimen other than nasopharyngeal swab, presence of viral mutation(s) within the areas targeted by this assay, and inadequate number of viral copies (<131 copies/mL). A negative result must be combined with clinical observations, patient history, and epidemiological information. The expected result is Negative. Fact Sheet for Patients:  PinkCheek.be Fact Sheet for Healthcare Providers:  GravelBags.it This test is not yet ap proved or cleared by the [place] FDA and  has been authorized for detection and/or diagnosis of SARS-CoV-2 by FDA under an Emergency Use Authorization (EUA). This EUA will remain  in effect (meaning this test can be used) for the duration of the COVID-19 declaration under Section 564(b)(1) of the Act, 21 U.S.C. section 360bbb-3(b)(1), unless the authorization is terminated or  revoked sooner.    Influenza A by PCR NEGATIVE NEGATIVE Final   Influenza B by  PCR NEGATIVE NEGATIVE Final    Comment: (NOTE) The Xpert Xpress SARS-CoV-2/FLU/RSV assay is intended as an aid in  the diagnosis of influenza from Nasopharyngeal swab specimens and  should not be used as a sole basis for treatment. Nasal washings and  aspirates are unacceptable for Xpert Xpress SARS-CoV-2/FLU/RSV  testing. Fact Sheet for Patients: PinkCheek.be Fact Sheet for Healthcare Providers: GravelBags.it This test is not yet approved or cleared by the [place] FDA and  has been authorized for detection and/or diagnosis of SARS-CoV-2 by  FDA under an Emergency Use Authorization (EUA). This EUA will remain  in effect (meaning this test can be used) for the duration of the  Covid-19 declaration under Section 564(b)(1) of the Act, 21  U.S.C. section 360bbb-3(b)(1), unless the authorization is  terminated or revoked. Performed at [facility], [address] [place], [place] [address]   MRSA PCR Screening     Status: None   Collection Time: 08/29/19 12:16 AM   Specimen: Foot, Left; Nasopharyngeal  Result Value Ref Range Status   MRSA by PCR NEGATIVE NEGATIVE Final    Comment:        The GeneXpert MRSA Assay (FDA approved for NASAL specimens only), is one component of a comprehensive MRSA colonization surveillance program. It is not intended to diagnose MRSA infection nor to guide or monitor treatment for MRSA infections. Performed at [facility], [address] [place], [place] [address]   Blood Culture (routine x 2)     Status: None   Collection Time: 08/29/19  1:31 AM   Specimen: Leg; Blood  Result Value Ref Range Status   Specimen Description LEG  Final   Special Requests Blood Culture adequate volume  Final   Culture   Final    NO GROWTH 5 DAYS Performed at [facility], [address] [place], [place] [address]    Report Status 09/03/2019 FINAL  Final  Culture, blood  (single) w Reflex to ID Panel     Status: None   Collection Time: 08/30/19  6:12 PM   Specimen: BLOOD  Result Value Ref Range Status   Specimen Description BLOOD RAC  Final   Special Requests   Final    BOTTLES DRAWN AEROBIC AND ANAEROBIC Blood Culture adequate volume   Culture   Final    NO GROWTH 5 DAYS Performed at [facility], [address], [place], [place] [address]    Report Status 09/04/2019 FINAL  Final  Culture, blood (single) w Reflex to ID Panel     Status: None   Collection Time: 08/30/19 10:27 PM   Specimen: BLOOD  Result Value Ref Range Status   Specimen Description BLOOD RIGHT ANTECUBITAL  Final   Special Requests   Final    BOTTLES DRAWN AEROBIC AND ANAEROBIC Blood Culture adequate volume   Culture   Final    NO GROWTH 5 DAYS Performed at [facility], [address] [place], [place] [address]    Report Status 09/04/2019 FINAL  Final  SARS CORONAVIRUS 2 (TAT 6-24 HRS) Nasopharyngeal Nasopharyngeal Swab     Status: None   Collection Time: 09/04/19  8:37 PM   Specimen: Nasopharyngeal Swab  Result Value Ref Range Status   SARS Coronavirus 2 NEGATIVE NEGATIVE Final    Comment: (NOTE) SARS-CoV-2 target nucleic acids are NOT DETECTED. The SARS-CoV-2 RNA is generally detectable in  upper and lower respiratory specimens during the acute phase of infection. Negative results do not preclude SARS-CoV-2 infection, do not rule out co-infections with other pathogens, and should not be used as the sole basis for treatment or other patient management decisions. Negative results must be combined with clinical observations, patient history, and epidemiological information. The expected result is Negative. Fact Sheet for Patients: SugarRoll.be Fact Sheet for Healthcare Providers: https://www.woods-mathews.com/ This test is not yet approved or cleared by the [place] FDA and  has been authorized for detection  and/or diagnosis of SARS-CoV-2 by FDA under an Emergency Use Authorization (EUA). This EUA will remain  in effect (meaning this test can be used) for the duration of the COVID-19 declaration under Section 56 4(b)(1) of the Act, 21 U.S.C. section 360bbb-3(b)(1), unless the authorization is terminated or revoked sooner. Performed at [facility], [place] [address], [place], [place] [address]      Time coordinating discharge: Over 30 minutes  SIGNED:   Nolberto Hanlon, MD  [facility] 09/05/2019, 1:15 PM Pager   If 7PM-7AM, please contact night-coverage [email or web address] Password [email or web address]

## 2019-09-05 NOTE — Progress Notes (Signed)
RN called report to [facility]. Report given to Yaakov Guthrie LPN.

## 2019-09-05 NOTE — Progress Notes (Signed)
Darrick H Labrie  A and O x 2.  VSS. Pt tolerating diet well. No complaints of pain or nausea. IV removed intact. Pt discharged via EMS to [facility].  Allergies as of 09/05/2019      Reactions   Cefuroxime Itching   Shrimp [shellfish Allergy] Other (See Comments)   Per mar. Unknown reaction   Sulfa Antibiotics Itching, Rash      Medication List    STOP taking these medications   ibuprofen 200 MG tablet Commonly known as: ADVIL   oxyCODONE 5 MG immediate release tablet Commonly known as: Oxy IR/ROXICODONE     TAKE these medications   acetaminophen 500 MG tablet Commonly known as: TYLENOL Take 500 mg by mouth every 4 (four) hours as needed for mild pain or fever.   albuterol 108 (90 Base) MCG/ACT inhaler Commonly known as: VENTOLIN HFA Inhale 2 puffs into the lungs every 4 (four) hours as needed for wheezing or shortness of breath.   aspirin EC 81 MG tablet Take 1 tablet (81 mg total) by mouth daily. What changed: when to take this   atorvastatin 10 MG tablet Commonly known as: LIPITOR Take 20 mg by mouth daily at 6 PM.   b complex vitamins tablet Take 1 tablet by mouth daily at 6 PM.   bisacodyl 5 MG EC tablet Commonly known as: DULCOLAX Take 1 tablet (5 mg total) by mouth daily as needed for moderate constipation.   clopidogrel 75 MG tablet Commonly known as: PLAVIX Take 75 mg by mouth daily at 6 PM.   D3-1000 25 MCG (1000 UT) tablet Generic drug: Cholecalciferol Take 1,000 Units by mouth daily at 6 PM.   epoetin alfa 10000 UNIT/ML injection Commonly known as: EPOGEN Inject 0.4 mLs (4,000 Units total) into the vein Every Tuesday,Thursday,and Saturday with dialysis.   eucerin cream Apply 1 application topically 2 (two) times daily as needed (rash).   feeding supplement (NEPRO CARB STEADY) Liqd Take 237 mLs by mouth 2 (two) times daily between meals.   ferrous sulfate 325 (65 FE) MG tablet Take 325 mg by mouth daily at 6 PM.   gabapentin 100 MG  capsule Commonly known as: NEURONTIN Take 1 capsule (100 mg total) by mouth every evening. What changed:   how much to take  when to take this   heparin 5000 UNIT/ML injection Inject 1 mL (5,000 Units total) into the skin every 8 (eight) hours.   lactulose 10 GM/15ML solution Commonly known as: CHRONULAC Take 20 g by mouth at bedtime.   lidocaine-prilocaine cream Commonly known as: EMLA Apply 1 application topically Every Tuesday,Thursday,and Saturday with dialysis.   linagliptin 5 MG Tabs tablet Commonly known as: TRADJENTA Take 5 mg by mouth daily.   lip balm Oint Apply 1 application topically as needed for lip care.   lisinopril 10 MG tablet Commonly known as: ZESTRIL Take 1 tablet (10 mg total) by mouth daily at 6 PM. What changed: how much to take   metoprolol succinate 25 MG 24 hr tablet Commonly known as: TOPROL-XL Take 25 mg by mouth daily at 6 PM.   multivitamin tablet Take 1 tablet by mouth at bedtime.   nitroGLYCERIN 0.4 MG SL tablet Commonly known as: NITROSTAT Place 0.4 mg under the tongue every 5 (five) minutes as needed for chest pain.   Orajel Maximum Strength 20 % Gel Generic drug: benzocaine Use as directed 1 application in the mouth or throat 4 (four) times daily as needed (pain).   oxyCODONE-acetaminophen  5-325 MG tablet Commonly known as: Percocet Take 1 tablet by mouth every 6 (six) hours as needed for severe pain.   polyethylene glycol 17 g packet Commonly known as: MIRALAX / GLYCOLAX Take 17 g by mouth daily as needed for moderate constipation or severe constipation. What changed:   when to take this  reasons to take this   Polyvinyl Alcohol-Povidone 5-6 MG/ML Soln Place 1 drop into the left eye 4 (four) times daily.   saccharomyces boulardii 250 MG capsule Commonly known as: FLORASTOR Take 250 mg by mouth daily.   sertraline 100 MG tablet Commonly known as: ZOLOFT Take 100 mg by mouth at bedtime.   sevelamer carbonate 800  MG tablet Commonly known as: RENVELA Take 800 mg by mouth 2 (two) times daily. (with snacks)   sevelamer carbonate 800 MG tablet Commonly known as: RENVELA Take 1,600 mg by mouth 3 (three) times daily with meals.   Systane Ultra PF 0.4-0.3 % Soln Generic drug: Polyethyl Glyc-Propyl Glyc PF Place 1 drop into the left eye 4 (four) times daily.   vancomycin 500 MG/100ML IVPB Commonly known as: VANCOREADY Inject 100 mLs (500 mg total) into the vein Every Tuesday,Thursday,and Saturday with dialysis. Start taking on: September 07, 2019       Vitals:   09/05/19 1339 09/05/19 1409  BP: (!) 151/74 (!) 149/73  Pulse:  88  Resp:  18  Temp:  98 F (36.7 C)  SpO2:  95%    Francesco Sor

## 2019-09-05 NOTE — Progress Notes (Signed)
Patient still in chair. Requesting to stay in chair for a while longer. Notified oncoming nurse.

## 2019-09-05 NOTE — Progress Notes (Signed)
Pharmacy Antibiotic Note  Dennis Walker is a 69 y.o. male admitted on 08/28/2019 with sepsis secondary to left toe infection/gangrene. 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 Patient with past medical history significant for CAD, CHF, Stroke, Right BKA, Diabetes, ESRD requiring dialysis Tuesday/Thursday/Saturday. Coagulase negative staph growing in 1/4 blood cultures.Pharmacy has been consulted for Vancomycin dosing.  Plan: Pre-dialyses Vancomycin level today 29 (goal 15-25)  Will adjust HD dosing to 500mg  (TTS) and recheck level prior to Tuesday dose (09/10/19)   Height: 5\' 11"  (180.3 cm) Weight: 160 lb 11.5 oz (72.9 kg) IBW/kg (Calculated) : 75.3  Temp (24hrs), Avg:98.3 F (36.8 C), Min:98 F (36.7 C), Max:98.7 F (37.1 C)  Recent Labs  Lab 08/31/19 0437 08/31/19 0830 09/01/19 0418 09/02/19 0551 09/02/19 0700 09/03/19 0500 09/04/19 0500 09/05/19 0540 09/05/19 0854  WBC 22.1*   < > 19.8* 14.4*  --  10.9* 12.6* 13.6*  --   CREATININE 7.86*  --  9.04* 9.82*  --   --  5.28* 6.77*  --   VANCORANDOM  --   --   --   --  33  --   --   --  29   < > = values in this interval not displayed.    Estimated Creatinine Clearance: 10.9 mL/min (A) (by C-G formula based on SCr of 6.77 mg/dL (H)).    Allergies  Allergen Reactions  . Cefuroxime Itching  . Shrimp [Shellfish Allergy] Other (See Comments)    Per mar. Unknown reaction   . Sulfa Antibiotics Itching and Rash    Antimicrobials this admission: Cefepime 2/24 x 1.  Metronidazole 2/24 x 1 Vancomycin 2/24 >>  Zosyn 2/25 >>3/2  Dose adjustments this admission: N/A  Microbiology results: 2/24 BCx: coag negative staph 1/4 2/26 BCx: no growth 2/25 MRSA PCR: negative  2/25 SARS Coronavirus: negative  2/25 Influenza A/B: negative   Thank you for allowing pharmacy to be a part of this patient's care.  Lu Duffel, PharmD, BCPS Clinical Pharmacist 09/05/2019 11:29 AM

## 2019-09-05 NOTE — Progress Notes (Signed)
PT Cancellation Note  Patient Details Name: Dennis Walker MRN: [id number] DOB: September 08, 1950   Cancelled Treatment:    Reason Eval/Treat Not Completed: Patient at procedure or test/unavailable(Consult received and chart reviewed. Patient currently off unit for dialysis.  Will re-attempt at later time/date as medically appropriate and available.)   Jmichael Gille H. Owens Shark, PT, DPT, NCS 09/05/19, 10:35 AM [phone]

## 2019-09-05 NOTE — Progress Notes (Signed)
Per Dr. Kurtis Bushman okay for RN to DC right femoral central line.

## 2019-09-05 NOTE — Care Management Important Message (Signed)
Important Message  Patient Details  Name: Dennis Walker MRN: [id number] Date of Birth: [birth year]   Medicare Important Message Given:  Yes     Dannette Barbara 09/05/2019, 1:37 PM

## 2019-09-05 NOTE — Progress Notes (Signed)
[place] INFECTIOUS DISEASE PROGRESS NOTE Date of Admission:  08/28/2019     ID: Dennis Walker is a 69 y.o. male with sepsis, gangrene Principal Problem:   Gangrenous toe ([place]) Active Problems:   Hypertension   ESRD (end stage renal disease) ([place])   Chronic systolic heart failure (HCC)   Hypotension   PVD (peripheral vascular disease) (HCC)   Hx of BKA, right (HCC)   Severe sepsis ([place])   Gangrene of left foot ([place])   Sepsis ([place])   Advanced care planning/counseling discussion   Goals of care, counseling/discussion   Palliative care by specialist   Subjective: S/p AKA on 2/27. No fevers,  FU bcx 2/26 NGTD.   TLCremoved  Today  ROS  Eleven systems are reviewed and negative except per hpi  Medications:  Antibiotics Given (last 72 hours)    Date/Time Action Medication Dose Rate   09/02/19 1602 New Bag/Given   vancomycin (VANCOREADY) IVPB 500 mg/100 mL 500 mg 100 mL/hr   09/02/19 2305 New Bag/Given   piperacillin-tazobactam (ZOSYN) IVPB 3.375 g 3.375 g 12.5 mL/hr   09/03/19 0824 New Bag/Given   piperacillin-tazobactam (ZOSYN) IVPB 3.375 g 3.375 g 12.5 mL/hr   09/03/19 1400 New Bag/Given   vancomycin (VANCOREADY) IVPB 750 mg/150 mL 750 mg 150 mL/hr   09/05/19 1144 New Bag/Given  [given in HD]   vancomycin (VANCOREADY) IVPB 500 mg/100 mL 750 mg 100 mL/hr     . aspirin EC  81 mg Oral Daily  . atorvastatin  20 mg Oral q1800  . cholecalciferol  1,000 Units Oral q1800  . clopidogrel  75 mg Oral q1800  . epoetin (EPOGEN/PROCRIT) injection  4,000 Units Intravenous Q T,Th,Sa-HD  . feeding supplement (NEPRO CARB STEADY)  237 mL Oral BID BM  . ferrous sulfate  325 mg Oral q1800  . gabapentin  100 mg Oral QPM  . heparin injection (subcutaneous)  5,000 Units Subcutaneous Q8H  . insulin aspart  0-15 Units Subcutaneous Q4H  . mouth rinse  15 mL Mouth Rinse BID  . metoprolol succinate  25 mg Oral Daily  . multivitamin  1 tablet Oral QHS  . multivitamin with minerals  1 tablet  Oral QHS  . polyvinyl alcohol  1 drop Left Eye QID  . sertraline  100 mg Oral QHS  . sodium chloride flush  10-40 mL Intracatheter Q12H    Objective: Vital signs in last 24 hours: Temp:  [98 F (36.7 C)-98.7 F (37.1 C)] 98 F (36.7 C) (03/04 1409) Pulse Rate:  [60-88] 88 (03/04 1409) Resp:  [12-20] 18 (03/04 1409) BP: (86-173)/(18-76) 149/73 (03/04 1409) SpO2:  [94 %-100 %] 95 % (03/04 1409) Weight:  [70.3 kg] 70.3 kg (03/04 1339) .Physical Exam  Constitutional: He is confused, appears older than stated age, chronically ill apperain HENT: anicteric  Mouth/Throat: Oropharynx is clear and moist. No oropharyngeal exudate.  Cardiovascular: Normal rate, regular rhythm and normal heart sounds.Pulmonary/Chest: Effort normal and breath sounds normal. No respiratory distress. He has no wheezes.  Abdominal: Soft. Bowel sounds are normal. He exhibits no distension. There is no tenderness.  RUE with AVF, LUE with old AVF Lymphadenopathy: He has no cervical adenopathy.  Neurological: He is asomewhat cofused today Skin: Skin is warm and dry. No rash noted. No erythema.  Psychiatric: He has a normal mood and affect. His behavior is normal.   Lab Results Recent Labs    09/04/19 0500 09/05/19 0540  WBC 12.6* 13.6*  HGB 8.9* 9.3*  HCT 28.2*  29.1*  NA 139 137  K 3.3* 3.3*  CL 97* 95*  CO2 26 26  BUN 29* 36*  CREATININE 5.28* 6.77*    Microbiology: Results for orders placed or performed during the hospital encounter of 08/28/19  Blood Culture (routine x 2)     Status: Abnormal (Preliminary result)   Collection Time: 08/28/19  5:46 PM   Specimen: BLOOD  Result Value Ref Range Status   Specimen Description   Final    BLOOD RESIDENT Performed at [facility], [address] [place], [place] [address]    Special Requests   Final    BOTTLES DRAWN AEROBIC AND ANAEROBIC Blood Culture results may not be optimal due to an inadequate volume of blood received in culture  bottles Performed at [facility], [address] [place], [place] [address]    Culture  Setup Time   Final    GRAM POSITIVE COCCI AEROBIC BOTTLE ONLY CRITICAL RESULT CALLED TO, READ BACK BY AND VERIFIED WITH: JASON ROBBINS 08/29/19 @ 1803  [place] Performed at [facility], [address], [place], [place] [address]    Culture (A)  Final    STAPHYLOCOCCUS CAPITIS SUSCEPTIBILITIES TO FOLLOW Performed at [facility], [place] [address] [place], [place] [address]    Report Status PENDING  Incomplete  Respiratory Panel by RT PCR (Flu A&B, Covid) - Nasopharyngeal Swab     Status: None   Collection Time: 08/28/19  7:32 PM   Specimen: Nasopharyngeal Swab  Result Value Ref Range Status   SARS Coronavirus 2 by RT PCR NEGATIVE NEGATIVE Final    Comment: (NOTE) SARS-CoV-2 target nucleic acids are NOT DETECTED. The SARS-CoV-2 RNA is generally detectable in upper respiratoy specimens during the acute phase of infection. The lowest concentration of SARS-CoV-2 viral copies this assay can detect is 131 copies/mL. A negative result does not preclude SARS-Cov-2 infection and should not be used as the sole basis for treatment or other patient management decisions. A negative result may occur with  improper specimen collection/handling, submission of specimen other than nasopharyngeal swab, presence of viral mutation(s) within the areas targeted by this assay, and inadequate number of viral copies (<131 copies/mL). A negative result must be combined with clinical observations, patient history, and epidemiological information. The expected result is Negative. Fact Sheet for Patients:  PinkCheek.be Fact Sheet for Healthcare Providers:  GravelBags.it This test is not yet ap proved or cleared by the [place] FDA and  has been authorized for detection and/or diagnosis of SARS-CoV-2 by FDA under an Emergency Use  Authorization (EUA). This EUA will remain  in effect (meaning this test can be used) for the duration of the COVID-19 declaration under Section 564(b)(1) of the Act, 21 U.S.C. section 360bbb-3(b)(1), unless the authorization is terminated or revoked sooner.    Influenza A by PCR NEGATIVE NEGATIVE Final   Influenza B by PCR NEGATIVE NEGATIVE Final    Comment: (NOTE) The Xpert Xpress SARS-CoV-2/FLU/RSV assay is intended as an aid in  the diagnosis of influenza from Nasopharyngeal swab specimens and  should not be used as a sole basis for treatment. Nasal washings and  aspirates are unacceptable for Xpert Xpress SARS-CoV-2/FLU/RSV  testing. Fact Sheet for Patients: PinkCheek.be Fact Sheet for Healthcare Providers: GravelBags.it This test is not yet approved or cleared by the [place] FDA and  has been authorized for detection and/or diagnosis of SARS-CoV-2 by  FDA under an Emergency Use Authorization (EUA). This EUA will remain  in effect (meaning this test can be used) for the duration of the  Covid-19 declaration under Section 564(b)(1) of the Act, 21  U.S.C. section 360bbb-3(b)(1), unless the authorization is  terminated or revoked. Performed at [facility], [address] [place], [place] [address]   MRSA PCR Screening     Status: None   Collection Time: 08/29/19 12:16 AM   Specimen: Foot, Left; Nasopharyngeal  Result Value Ref Range Status   MRSA by PCR NEGATIVE NEGATIVE Final    Comment:        The GeneXpert MRSA Assay (FDA approved for NASAL specimens only), is one component of a comprehensive MRSA colonization surveillance program. It is not intended to diagnose MRSA infection nor to guide or monitor treatment for MRSA infections. Performed at [facility], [address] [place], [place] [address]   Blood Culture (routine x 2)     Status: None   Collection Time: 08/29/19  1:31 AM    Specimen: Leg; Blood  Result Value Ref Range Status   Specimen Description LEG  Final   Special Requests Blood Culture adequate volume  Final   Culture   Final    NO GROWTH 5 DAYS Performed at [facility], [address] [place], [place] [address]    Report Status 09/03/2019 FINAL  Final  Culture, blood (single) w Reflex to ID Panel     Status: None   Collection Time: 08/30/19  6:12 PM   Specimen: BLOOD  Result Value Ref Range Status   Specimen Description BLOOD RAC  Final   Special Requests   Final    BOTTLES DRAWN AEROBIC AND ANAEROBIC Blood Culture adequate volume   Culture   Final    NO GROWTH 5 DAYS Performed at [facility], [address], [place], [place] [address]    Report Status 09/04/2019 FINAL  Final  Culture, blood (single) w Reflex to ID Panel     Status: None   Collection Time: 08/30/19 10:27 PM   Specimen: BLOOD  Result Value Ref Range Status   Specimen Description BLOOD RIGHT ANTECUBITAL  Final   Special Requests   Final    BOTTLES DRAWN AEROBIC AND ANAEROBIC Blood Culture adequate volume   Culture   Final    NO GROWTH 5 DAYS Performed at [facility], [address] [place], [place] [address]    Report Status 09/04/2019 FINAL  Final  SARS CORONAVIRUS 2 (TAT 6-24 HRS) Nasopharyngeal Nasopharyngeal Swab     Status: None   Collection Time: 09/04/19  8:37 PM   Specimen: Nasopharyngeal Swab  Result Value Ref Range Status   SARS Coronavirus 2 NEGATIVE NEGATIVE Final    Comment: (NOTE) SARS-CoV-2 target nucleic acids are NOT DETECTED. The SARS-CoV-2 RNA is generally detectable in upper and lower respiratory specimens during the acute phase of infection. Negative results do not preclude SARS-CoV-2 infection, do not rule out co-infections with other pathogens, and should not be used as the sole basis for treatment or other patient management decisions. Negative results must be combined with clinical observations, patient  history, and epidemiological information. The expected result is Negative. Fact Sheet for Patients: SugarRoll.be Fact Sheet for Healthcare Providers: https://www.woods-mathews.com/ This test is not yet approved or cleared by the [place] FDA and  has been authorized for detection and/or diagnosis of SARS-CoV-2 by FDA under an Emergency Use Authorization (EUA). This EUA will remain  in effect (meaning this test can be used) for the duration of  the COVID-19 declaration under Section 56 4(b)(1) of the Act, 21 U.S.C. section 360bbb-3(b)(1), unless the authorization is terminated or revoked sooner. Performed at [facility], [place] [address] [place], [place] [address]     Studies/Results: No results found.  Assessment/Plan: Sepsis  Likely secondary gangrenous toe infection- HD stable now.  No fevers and wbc down to 14.  now s/p AKA.   Coag neg staph in blood- could be  a contaminant- but only 1 set was sent. He has numerous stents in AV fistula and also has a TLC in groin  and a new AV graft- so need to r/o a true infection FU BCX neg. 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 H/o rt BKA  Thank you very much for the consult.   Leonel Ramsay   09/05/2019, 2:22 PM

## 2019-09-05 NOTE — Progress Notes (Addendum)
SLP Cancellation Note  Patient Details Name: Dennis Walker MRN: [id number] DOB: 07-08-1950   Cancelled treatment:       Reason Eval/Treat Not Completed: Other (comment). Chart reviewed noted pt now to be discharged shortly, nsg interviewed who reported pt eats slowly and presents with s/s aspiration w/PO intake. EMS is being called to pick up patient and transport back to pt's SNF where he is a resident. Discussed rec w/nsg to include MD orders for SLP Eval and treat to further assess safety of swallow when arrive at SNF to determine safest possible PO diet.    Nayara Taplin, MA, CCC-SLP 09/05/2019, 2:54 PM

## 2019-09-05 NOTE — Progress Notes (Signed)
Dennis Walker continues to have a 7/10 pain at this time. Dressing changed at this point. Two Ab pad completely saturated at this point. New ABD pad placed and wrapped with kerlex and ACE wrap. Patient seemed to tolerate well. Safety checks completed and call light placed within reach. Will continue to monitor.

## 2019-09-05 NOTE — Progress Notes (Signed)
SLP Cancellation Note  Patient Details Name: Dennis Walker MRN: [id number] DOB: June 16, 1951   Cancelled treatment:       Reason Eval/Treat Not Completed: Patient at procedure or test/unavailable(chart reviewed). Pt remains out of room at HD currently. ST services will f/u when pt is available later today or in the morning per schedule permitting.       Orinda Kenner, MS, CCC-SLP Kadin Canipe 09/05/2019, 11:56 AM

## 2019-09-05 NOTE — Progress Notes (Signed)
[facility], [place] 09/05/19  Subjective:   LOS: 8 03/03 0701 - 03/04 0700 In: 73 [P.O.:20; I.V.:30] Out: 0   Doing fair Seen during HD States he was able to sit in chair yesterday   HEMODIALYSIS FLOWSHEET:  Blood Flow Rate (mL/min): 300 mL/min Arterial Pressure (mmHg): -170 mmHg Venous Pressure (mmHg): 150 mmHg Transmembrane Pressure (mmHg): 70 mmHg Ultrafiltration Rate (mL/min): 650 mL/min Dialysate Flow Rate (mL/min): 600 ml/min Conductivity: Machine : 13.9 Conductivity: Machine : 13.9 Dialysis Fluid Bolus: Normal Saline Bolus Amount (mL): 250 mL     Objective:  Vital signs in last 24 hours:  Temp:  [98 F (36.7 C)-98.7 F (37.1 C)] 98.7 F (37.1 C) (03/04 0900) Pulse Rate:  [60-86] 79 (03/04 1100) Resp:  [12-20] 16 (03/04 1100) BP: (86-167)/(18-100) 160/45 (03/04 1100) SpO2:  [98 %-100 %] 100 % (03/04 0510)  Weight change:  Filed Weights   09/01/19 0600 09/02/19 1040 09/03/19 1145  Weight: 75.7 kg 74.1 kg 72.9 kg    Intake/Output:    Intake/Output Summary (Last 24 hours) at 09/05/2019 1104 Last data filed at 09/05/2019 0500 Gross per 24 hour  Intake 50 ml  Output 0 ml  Net 50 ml     Physical Exam: General:  Lying in the bed, no acute distress  HEENT  moist oral mucous membrane  Pulm/lungs  normal effort on room air, decreased breath sounds at bases  CVS/Heart  no rub  Abdomen:   Soft, nontender  Extremities:  New left AKA, right BKA  Neurologic:  Alert, able to answer questions  Skin:  No acute rashes  Access:  Right arm new AV graft, left arm old AV graft       Basic Metabolic Panel:  Recent Labs  Lab 08/30/19 0511 08/30/19 0511 08/31/19 0437 08/31/19 0437 09/01/19 0418 09/01/19 0418 09/02/19 0551 09/03/19 1318 09/04/19 0500 09/05/19 0540  NA 140   < > 139  --  137  --  140  --  139 137  K 3.4*   < > 4.0  --  4.4  --  4.3  --  3.3* 3.3*  CL 99   < > 98  --  97*  --  97*  --  97* 95*  CO2 25   < > 27   --  25  --  24  --  26 26  GLUCOSE 115*   < > 190*  --  174*  --  126*  --  128* 99  BUN 31*   < > 46*  --  64*  --  81*  --  29* 36*  CREATININE 6.42*   < > 7.86*  --  9.04*  --  9.82*  --  5.28* 6.77*  CALCIUM 8.3*   < > 8.1*   < > 8.2*   < > 7.6*  --  8.0* 8.2*  MG 2.1  --   --   --   --   --   --   --   --   --   PHOS 3.2  --   --   --   --   --   --  2.6  --   --    < > = values in this interval not displayed.     CBC: Recent Labs  Lab 08/30/19 0511 08/30/19 0511 08/31/19 0437 08/31/19 0830 09/01/19 0418 09/02/19 0551 09/03/19 0500 09/04/19 0500 09/05/19 0540  WBC 20.3*   < > 22.1*   < >  19.8* 14.4* 10.9* 12.6* 13.6*  NEUTROABS 17.5*  --  20.8*  --  18.0* 11.6*  --   --   --   HGB 9.9*   < > 10.9*   < > 9.8* 9.6* 9.5* 8.9* 9.3*  HCT 30.9*   < > 34.5*   < > 31.4* 28.9* 30.1* 28.2* 29.1*  MCV 85.1   < > 85.8   < > 84.2 81.4 85.0 85.5 83.9  PLT 248   < > 292   < > 297 278 268 280 299   < > = values in this interval not displayed.      Lab Results  Component Value Date   HEPBSAG Negative 12/26/2017   HEPBSAB Non Reactive 12/26/2017      Microbiology:  Recent Results (from the past 240 hour(s))  Blood Culture (routine x 2)     Status: Abnormal (Preliminary result)   Collection Time: 08/28/19  5:46 PM   Specimen: BLOOD  Result Value Ref Range Status   Specimen Description   Final    BLOOD RESIDENT Performed at [facility], [address] [place], [place] [address]    Special Requests   Final    BOTTLES DRAWN AEROBIC AND ANAEROBIC Blood Culture results may not be optimal due to an inadequate volume of blood received in culture bottles Performed at [facility], [address], [place], [place] [address]    Culture  Setup Time   Final    GRAM POSITIVE COCCI AEROBIC BOTTLE ONLY CRITICAL RESULT CALLED TO, READ BACK BY AND VERIFIED WITH: JASON ROBBINS 08/29/19 @ 1803  [place] Performed at [facility], [address],  [place], [place] [address]    Culture (A)  Final    STAPHYLOCOCCUS CAPITIS SUSCEPTIBILITIES TO FOLLOW Performed at [facility], [place] [address] [place], [place] [address]    Report Status PENDING  Incomplete  Respiratory Panel by RT PCR (Flu A&B, Covid) - Nasopharyngeal Swab     Status: None   Collection Time: 08/28/19  7:32 PM   Specimen: Nasopharyngeal Swab  Result Value Ref Range Status   SARS Coronavirus 2 by RT PCR NEGATIVE NEGATIVE Final    Comment: (NOTE) SARS-CoV-2 target nucleic acids are NOT DETECTED. The SARS-CoV-2 RNA is generally detectable in upper respiratoy specimens during the acute phase of infection. The lowest concentration of SARS-CoV-2 viral copies this assay can detect is 131 copies/mL. A negative result does not preclude SARS-Cov-2 infection and should not be used as the sole basis for treatment or other patient management decisions. A negative result may occur with  improper specimen collection/handling, submission of specimen other than nasopharyngeal swab, presence of viral mutation(s) within the areas targeted by this assay, and inadequate number of viral copies (<131 copies/mL). A negative result must be combined with clinical observations, patient history, and epidemiological information. The expected result is Negative. Fact Sheet for Patients:  PinkCheek.be Fact Sheet for Healthcare Providers:  GravelBags.it This test is not yet ap proved or cleared by the [place] FDA and  has been authorized for detection and/or diagnosis of SARS-CoV-2 by FDA under an Emergency Use Authorization (EUA). This EUA will remain  in effect (meaning this test can be used) for the duration of the COVID-19 declaration under Section 564(b)(1) of the Act, 21 U.S.C. section 360bbb-3(b)(1), unless the authorization is terminated or revoked sooner.    Influenza A by PCR NEGATIVE NEGATIVE Final   Influenza B by  PCR NEGATIVE NEGATIVE  Final    Comment: (NOTE) The Xpert Xpress SARS-CoV-2/FLU/RSV assay is intended as an aid in  the diagnosis of influenza from Nasopharyngeal swab specimens and  should not be used as a sole basis for treatment. Nasal washings and  aspirates are unacceptable for Xpert Xpress SARS-CoV-2/FLU/RSV  testing. Fact Sheet for Patients: PinkCheek.be Fact Sheet for Healthcare Providers: GravelBags.it This test is not yet approved or cleared by the [place] FDA and  has been authorized for detection and/or diagnosis of SARS-CoV-2 by  FDA under an Emergency Use Authorization (EUA). This EUA will remain  in effect (meaning this test can be used) for the duration of the  Covid-19 declaration under Section 564(b)(1) of the Act, 21  U.S.C. section 360bbb-3(b)(1), unless the authorization is  terminated or revoked. Performed at [facility], [address] [place], [place] [address]   MRSA PCR Screening     Status: None   Collection Time: 08/29/19 12:16 AM   Specimen: Foot, Left; Nasopharyngeal  Result Value Ref Range Status   MRSA by PCR NEGATIVE NEGATIVE Final    Comment:        The GeneXpert MRSA Assay (FDA approved for NASAL specimens only), is one component of a comprehensive MRSA colonization surveillance program. It is not intended to diagnose MRSA infection nor to guide or monitor treatment for MRSA infections. Performed at [facility], [address] [place], [place] [address]   Blood Culture (routine x 2)     Status: None   Collection Time: 08/29/19  1:31 AM   Specimen: Leg; Blood  Result Value Ref Range Status   Specimen Description LEG  Final   Special Requests Blood Culture adequate volume  Final   Culture   Final    NO GROWTH 5 DAYS Performed at [facility], [address], [place], [place] [address]    Report Status 09/03/2019 FINAL  Final  Culture, blood  (single) w Reflex to ID Panel     Status: None   Collection Time: 08/30/19  6:12 PM   Specimen: BLOOD  Result Value Ref Range Status   Specimen Description BLOOD RAC  Final   Special Requests   Final    BOTTLES DRAWN AEROBIC AND ANAEROBIC Blood Culture adequate volume   Culture   Final    NO GROWTH 5 DAYS Performed at [facility], [address] [place], [place] [address]    Report Status 09/04/2019 FINAL  Final  Culture, blood (single) w Reflex to ID Panel     Status: None   Collection Time: 08/30/19 10:27 PM   Specimen: BLOOD  Result Value Ref Range Status   Specimen Description BLOOD RIGHT ANTECUBITAL  Final   Special Requests   Final    BOTTLES DRAWN AEROBIC AND ANAEROBIC Blood Culture adequate volume   Culture   Final    NO GROWTH 5 DAYS Performed at [facility], [address], [place], [place] [address]    Report Status 09/04/2019 FINAL  Final  SARS CORONAVIRUS 2 (TAT 6-24 HRS) Nasopharyngeal Nasopharyngeal Swab     Status: None   Collection Time: 09/04/19  8:37 PM   Specimen: Nasopharyngeal Swab  Result Value Ref Range Status   SARS Coronavirus 2 NEGATIVE NEGATIVE Final    Comment: (NOTE) SARS-CoV-2 target nucleic acids are NOT DETECTED. The SARS-CoV-2 RNA is generally detectable in upper and lower respiratory specimens during the acute phase of infection. Negative results do not preclude SARS-CoV-2 infection, do not rule  out co-infections with other pathogens, and should not be used as the sole basis for treatment or other patient management decisions. Negative results must be combined with clinical observations, patient history, and epidemiological information. The expected result is Negative. Fact Sheet for Patients: SugarRoll.be Fact Sheet for Healthcare Providers: https://www.woods-mathews.com/ This test is not yet approved or cleared by the [place] FDA and  has been authorized for detection  and/or diagnosis of SARS-CoV-2 by FDA under an Emergency Use Authorization (EUA). This EUA will remain  in effect (meaning this test can be used) for the duration of the COVID-19 declaration under Section 56 4(b)(1) of the Act, 21 U.S.C. section 360bbb-3(b)(1), unless the authorization is terminated or revoked sooner. Performed at [facility], [place] [address] [place], [place] [address]     Coagulation Studies: No results for input(s): LABPROT, INR in the last 72 hours.  Urinalysis: No results for input(s): COLORURINE, LABSPEC, PHURINE, GLUCOSEU, HGBUR, BILIRUBINUR, KETONESUR, PROTEINUR, UROBILINOGEN, NITRITE, LEUKOCYTESUR in the last 72 hours.  Invalid input(s): APPERANCEUR    Imaging: ECHOCARDIOGRAM COMPLETE  Result Date: 09/03/2019    ECHOCARDIOGRAM REPORT   Patient Name:   Dennis Walker Date of Exam: 09/03/2019 Medical Rec #:  [id number]    Height:       71.0 in Accession #:    [id number]   Weight:       163.4 lb Date of Birth:  [birth year]    BSA:          1.935 m Patient Age:    69 years     BP:           152/48 mmHg Patient Gender: M            HR:           78 bpm. Exam Location:  [facility] Procedure: 2D Echo, Color Doppler and Cardiac Doppler Indications:     R78.81 Bacteremia  History:         Patient has prior history of Echocardiogram examinations. CHF,                  Previous Myocardial Infarction and CAD, Stroke and PVD, ESRD;                  Risk Factors:Hypertension, Dyslipidemia and Diabetes.  Sonographer:     Charmayne Sheer RDCS (AE) Referring Phys:  [id number] Floyce Stakes GRIFFITH Diagnosing Phys: Ida Rogue MD  Sonographer Comments: Suboptimal apical window. IMPRESSIONS  1. Left ventricular ejection fraction, by estimation, is 55 to 60%. The left ventricle has normal function. The left ventricle has no regional wall motion abnormalities. There is moderate left ventricular hypertrophy. Left ventricular diastolic parameters are consistent with Grade I diastolic dysfunction (impaired  relaxation).  2. Right ventricular systolic function is moderately reduced. The right ventricular size is moderate to severely enlarged. Tricuspid regurgitation signal is inadequate for assessing PA pressure. Septal flattening ande RV dilation concerning for elevated  RV pressure and volume.  3. Left atrial size was mildly dilated.  4. Right atrial size was moderately dilated.  5. Tricuspid valve regurgitation is mild to moderate.  6. The aortic valve has heavy sclerosis, Very mild aortic valve stenosis.  7. The inferior vena cava is normal in size with greater than 50% respiratory variability, suggesting right atrial pressure of 3 mmHg.  8. No valve vegetation noted. FINDINGS  Left Ventricle: Left ventricular ejection fraction, by estimation, is 55 to 60%. The left ventricle has normal function. The  left ventricle has no regional wall motion abnormalities. The left ventricular internal cavity size was normal in size. There is  moderate left ventricular hypertrophy. Left ventricular diastolic parameters are consistent with Grade I diastolic dysfunction (impaired relaxation). Right Ventricle: The right ventricular size is moderately enlarged. No increase in right ventricular wall thickness. Right ventricular systolic function is moderately reduced. Tricuspid regurgitation signal is inadequate for assessing PA pressure. Left Atrium: Left atrial size was mildly dilated. Right Atrium: Right atrial size was moderately dilated. Pericardium: There is no evidence of pericardial effusion. Mitral Valve: The mitral valve is normal in structure and function. Normal mobility of the mitral valve leaflets. No evidence of mitral valve regurgitation. No evidence of mitral valve stenosis. MV peak gradient, 5.3 mmHg. The mean mitral valve gradient is 3.0 mmHg. Tricuspid Valve: The tricuspid valve is normal in structure. Tricuspid valve regurgitation is mild to moderate. No evidence of tricuspid stenosis. Aortic Valve: The aortic valve  is normal in structure and function. Aortic valve regurgitation is not visualized. Mild aortic stenosis is present. Aortic valve mean gradient measures 8.0 mmHg. Aortic valve peak gradient measures 17.1 mmHg. Aortic valve area, by VTI measures 1.29 cm. Pulmonic Valve: The pulmonic valve was normal in structure. Pulmonic valve regurgitation is not visualized. No evidence of pulmonic stenosis. Aorta: The aortic root is normal in size and structure. Venous: The inferior vena cava is normal in size with greater than 50% respiratory variability, suggesting right atrial pressure of 3 mmHg. IAS/Shunts: No atrial level shunt detected by color flow Doppler.  LEFT VENTRICLE PLAX 2D LVIDd:         3.77 cm  Diastology LVIDs:         2.05 cm  LV e' lateral:   10.00 cm/s LV PW:         1.40 cm  LV E/e' lateral: 9.9 LV IVS:        0.76 cm  LV e' medial:    5.98 cm/s LVOT diam:     1.80 cm  LV E/e' medial:  16.6 LV SV:         46 LV SV Index:   24 LVOT Area:     2.54 cm  RIGHT VENTRICLE RV Basal diam:  3.39 cm LEFT ATRIUM             Index       RIGHT ATRIUM           Index LA diam:        4.10 cm 2.12 cm/m  RA Area:     18.40 cm LA Vol (A2C):   67.9 ml 35.09 ml/m RA Volume:   47.50 ml  24.55 ml/m LA Vol (A4C):   86.8 ml 44.86 ml/m LA Biplane Vol: 80.7 ml 41.71 ml/m  AORTIC VALVE                    PULMONIC VALVE AV Area (Vmax):    1.24 cm     PV Vmax:       0.84 m/s AV Area (Vmean):   1.39 cm     PV Vmean:      46.600 cm/s AV Area (VTI):     1.29 cm     PV VTI:        0.124 m AV Vmax:           207.00 cm/s  PV Peak grad:  2.9 mmHg AV Vmean:          123.000  cm/s PV Mean grad:  1.0 mmHg AV VTI:            0.353 m AV Peak Grad:      17.1 mmHg AV Mean Grad:      8.0 mmHg LVOT Vmax:         101.00 cm/s LVOT Vmean:        67.300 cm/s LVOT VTI:          0.179 m LVOT/AV VTI ratio: 0.51  AORTA Ao Root diam: 2.90 cm MITRAL VALVE MV Area (PHT): 3.37 cm     SHUNTS MV Peak grad:  5.3 mmHg     Systemic VTI:  0.18 m MV Mean grad:   3.0 mmHg     Systemic Diam: 1.80 cm MV Vmax:       1.15 m/s MV Vmean:      82.2 cm/s MV Decel Time: 225 msec MV E velocity: 99.00 cm/s MV A velocity: 107.00 cm/s MV E/A ratio:  0.93 Ida Rogue MD Electronically signed by Ida Rogue MD Signature Date/Time: 09/03/2019/11:41:02 AM    Final      Medications:   . sodium chloride Stopped (09/01/19 1601)  . vancomycin Stopped (09/03/19 1604)   . aspirin EC  81 mg Oral Daily  . atorvastatin  20 mg Oral q1800  . Chlorhexidine Gluconate Cloth  6 each Topical Daily  . cholecalciferol  1,000 Units Oral q1800  . clopidogrel  75 mg Oral q1800  . epoetin (EPOGEN/PROCRIT) injection  4,000 Units Intravenous Q T,Th,Sa-HD  . feeding supplement (NEPRO CARB STEADY)  237 mL Oral BID BM  . ferrous sulfate  325 mg Oral q1800  . gabapentin  100 mg Oral QPM  . heparin injection (subcutaneous)  5,000 Units Subcutaneous Q8H  . insulin aspart  0-15 Units Subcutaneous Q4H  . mouth rinse  15 mL Mouth Rinse BID  . metoprolol succinate  25 mg Oral Daily  . multivitamin  1 tablet Oral QHS  . multivitamin with minerals  1 tablet Oral QHS  . polyvinyl alcohol  1 drop Left Eye QID  . sertraline  100 mg Oral QHS  . sodium chloride flush  10-40 mL Intracatheter Q12H   acetaminophen, albuterol, bisacodyl, diphenhydrAMINE, HYDROmorphone (DILAUDID) injection, lip balm, ondansetron (ZOFRAN) IV, oxyCODONE-acetaminophen, oxyCODONE-acetaminophen, polyethylene glycol, sodium chloride flush  Assessment/ Plan:  69 y.o. male with end-stage renal disease on hemodialysis, hypertension, history of polio in childhood, hyperlipidemia, stroke with left-sided residual weakness, peripheral vascular disease-history of right BKA, new left AKA, coronary disease status post PCI to proximal LAD, type 2 diabetes, diabetic neuropathy, congestive heart failure, asthma  Principal Problem:   Gangrenous toe (HCC) Active Problems:   Hypertension   ESRD (end stage renal disease) (HCC)    Chronic systolic heart failure (HCC)   Hypotension   PVD (peripheral vascular disease) (HCC)   Hx of BKA, right ([place])   Severe sepsis ([place])   Gangrene of left foot ([place])   Sepsis ([place])   Advanced care planning/counseling discussion   Goals of care, counseling/discussion   Palliative care by specialist  [facility] TTS// [facility]// rt arm AVG  #. ESRD We will continue dialysis on TTS schedule Dialysis graft for successfully cannulated with a 16-gauge needle and 300 blood flow rate Routine hemodialysis today   #. Anemia of CKD  Lab Results  Component Value Date   HGB 9.3 (L) 09/05/2019   Low dose EPO with HD  #. SHPTH  No results found for: PTH Lab Results  Component Value Date   PHOS 2.6 09/03/2019   Monitor calcium and phos level during this admission  #. Diabetes type 2 with CKD with neuropathy, peripheral vascular disease Hgb A1c MFr Bld (%)  Date Value  08/29/2019 7.8 (H)  Managed with [facility] as outpatient  #Peripheral vascular disease History of right BKA Gangrene of left foot, nonsalvageable Left AKA on August 29, 2018      LOS: [place] 3/4/202111:04 [place], [place]

## 2019-09-05 NOTE — Progress Notes (Signed)
Patient seems to have a sudden increase in L stump pain at this time. Patient states it feels like bone pain, and sharp. Patient repositioned at this time. And dressing loosened. Also given PRN pain medication.Safety checks completed and call light placed within reach will continue to monitor.

## 2019-09-05 NOTE — TOC Transition Note (Signed)
Transition of Care [facility]) - CM/SW Discharge Note   Patient Details  Name: Dennis Walker MRN: [id number] Date of Birth: 1951-05-13  [facility] of [facility] CM/SW Contact:  Beverly Sessions, RN Phone Number: 09/05/2019, 2:01 PM   Clinical Narrative:    Patient to discharge to SNF today  Discharge information sent to [facility] in the [place]  EMS packet on chart, Bedside notified     Final next level of care: Skilled Nursing Facility Barriers to Discharge: No Barriers Identified   Patient Goals and CMS Choice        Discharge Placement                Patient to be transferred to facility by: EMS Name of family member notified: Daughter Patient and family notified of of transfer: 09/04/19  Discharge Plan and Services In-house Referral: Clinical Social Work   Post Acute Care Choice: Long Term Acute Care (LTAC)                               Social Determinants of Health (SDOH) Interventions     Readmission Risk Interventions No flowsheet data found.

## 2019-09-07 ENCOUNTER — Other Ambulatory Visit: Payer: Self-pay

## 2019-09-07 ENCOUNTER — Encounter: Payer: Self-pay | Admitting: Emergency Medicine

## 2019-09-07 ENCOUNTER — Emergency Department: Payer: Medicare Other

## 2019-09-07 ENCOUNTER — Emergency Department
Admission: EM | Admit: 2019-09-07 | Discharge: 2019-09-07 | Disposition: A | Discharge status: Skilled Nursing Facility | Payer: Medicare Other | Source: Home / Self Care | Attending: Emergency Medicine | Admitting: Emergency Medicine

## 2019-09-07 DIAGNOSIS — M96830 Postprocedural hemorrhage and hematoma of a musculoskeletal structure following a musculoskeletal system procedure: Secondary | ICD-10-CM | POA: Insufficient documentation

## 2019-09-07 DIAGNOSIS — A419 Sepsis, unspecified organism: Secondary | ICD-10-CM | POA: Diagnosis not present

## 2019-09-07 DIAGNOSIS — I509 Heart failure, unspecified: Secondary | ICD-10-CM | POA: Insufficient documentation

## 2019-09-07 DIAGNOSIS — M79605 Pain in left leg: Secondary | ICD-10-CM | POA: Insufficient documentation

## 2019-09-07 DIAGNOSIS — E1122 Type 2 diabetes mellitus with diabetic chronic kidney disease: Secondary | ICD-10-CM | POA: Insufficient documentation

## 2019-09-07 DIAGNOSIS — I132 Hypertensive heart and chronic kidney disease with heart failure and with stage 5 chronic kidney disease, or end stage renal disease: Secondary | ICD-10-CM | POA: Insufficient documentation

## 2019-09-07 DIAGNOSIS — Z7901 Long term (current) use of anticoagulants: Secondary | ICD-10-CM | POA: Insufficient documentation

## 2019-09-07 DIAGNOSIS — Z7984 Long term (current) use of oral hypoglycemic drugs: Secondary | ICD-10-CM | POA: Insufficient documentation

## 2019-09-07 DIAGNOSIS — I739 Peripheral vascular disease, unspecified: Secondary | ICD-10-CM | POA: Insufficient documentation

## 2019-09-07 DIAGNOSIS — J9690 Respiratory failure, unspecified, unspecified whether with hypoxia or hypercapnia: Secondary | ICD-10-CM | POA: Diagnosis not present

## 2019-09-07 DIAGNOSIS — N186 End stage renal disease: Secondary | ICD-10-CM | POA: Insufficient documentation

## 2019-09-07 DIAGNOSIS — Z992 Dependence on renal dialysis: Secondary | ICD-10-CM | POA: Insufficient documentation

## 2019-09-07 DIAGNOSIS — T148XXA Other injury of unspecified body region, initial encounter: Secondary | ICD-10-CM

## 2019-09-07 DIAGNOSIS — Z9889 Other specified postprocedural states: Secondary | ICD-10-CM | POA: Insufficient documentation

## 2019-09-07 DIAGNOSIS — Z89612 Acquired absence of left leg above knee: Secondary | ICD-10-CM | POA: Insufficient documentation

## 2019-09-07 DIAGNOSIS — Z79899 Other long term (current) drug therapy: Secondary | ICD-10-CM | POA: Insufficient documentation

## 2019-09-07 LAB — CBC WITH DIFFERENTIAL/PLATELET
Abs Immature Granulocytes: 0.16 10*3/uL — ABNORMAL HIGH (ref 0.00–0.07)
Basophils Absolute: 0.1 10*3/uL (ref 0.0–0.1)
Basophils Relative: 0 %
Eosinophils Absolute: 0.2 10*3/uL (ref 0.0–0.5)
Eosinophils Relative: 1 %
HCT: 28.4 % — ABNORMAL LOW (ref 39.0–52.0)
Hemoglobin: 8.9 g/dL — ABNORMAL LOW (ref 13.0–17.0)
Immature Granulocytes: 1 %
Lymphocytes Relative: 14 %
Lymphs Abs: 1.7 10*3/uL (ref 0.7–4.0)
MCH: 27.2 pg (ref 26.0–34.0)
MCHC: 31.3 g/dL (ref 30.0–36.0)
MCV: 86.9 fL (ref 80.0–100.0)
Monocytes Absolute: 1.5 10*3/uL — ABNORMAL HIGH (ref 0.1–1.0)
Monocytes Relative: 12 %
Neutro Abs: 8.7 10*3/uL — ABNORMAL HIGH (ref 1.7–7.7)
Neutrophils Relative %: 72 %
Platelets: 292 10*3/uL (ref 150–400)
RBC: 3.27 MIL/uL — ABNORMAL LOW (ref 4.22–5.81)
RDW: 19.2 % — ABNORMAL HIGH (ref 11.5–15.5)
WBC: 12.3 10*3/uL — ABNORMAL HIGH (ref 4.0–10.5)
nRBC: 1.3 % — ABNORMAL HIGH (ref 0.0–0.2)

## 2019-09-07 LAB — PROTIME-INR
INR: 2.2 — ABNORMAL HIGH (ref 0.8–1.2)
Prothrombin Time: 24.5 seconds — ABNORMAL HIGH (ref 11.4–15.2)

## 2019-09-07 LAB — COMPREHENSIVE METABOLIC PANEL
ALT: 41 U/L (ref 0–44)
AST: 65 U/L — ABNORMAL HIGH (ref 15–41)
Albumin: 2.6 g/dL — ABNORMAL LOW (ref 3.5–5.0)
Alkaline Phosphatase: 146 U/L — ABNORMAL HIGH (ref 38–126)
Anion gap: 17 — ABNORMAL HIGH (ref 5–15)
BUN: 34 mg/dL — ABNORMAL HIGH (ref 8–23)
CO2: 27 mmol/L (ref 22–32)
Calcium: 8.1 mg/dL — ABNORMAL LOW (ref 8.9–10.3)
Chloride: 91 mmol/L — ABNORMAL LOW (ref 98–111)
Creatinine, Ser: 7.14 mg/dL — ABNORMAL HIGH (ref 0.61–1.24)
GFR calc Af Amer: 8 mL/min — ABNORMAL LOW (ref >60)
GFR calc non Af Amer: 7 mL/min — ABNORMAL LOW (ref >60)
Glucose, Bld: 218 mg/dL — ABNORMAL HIGH (ref 70–99)
Potassium: 5 mmol/L (ref 3.5–5.1)
Sodium: 135 mmol/L (ref 135–145)
Total Bilirubin: 1.7 mg/dL — ABNORMAL HIGH (ref 0.3–1.2)
Total Protein: 6.8 g/dL (ref 6.5–8.1)

## 2019-09-07 LAB — CULTURE, BLOOD (ROUTINE X 2)

## 2019-09-07 MED ORDER — HYDROMORPHONE HCL 1 MG/ML IJ SOLN
0.5000 mg | Freq: Once | INTRAMUSCULAR | Status: AC
  Administered 2019-09-07: 0.5 mg via INTRAVENOUS
  Filled 2019-09-07: qty 1

## 2019-09-07 MED ORDER — ONDANSETRON HCL 4 MG/2ML IJ SOLN
4.0000 mg | Freq: Once | INTRAMUSCULAR | Status: AC
  Administered 2019-09-07: 4 mg via INTRAVENOUS

## 2019-09-07 MED ORDER — SODIUM CHLORIDE 0.9 % IV BOLUS
500.0000 mL | Freq: Once | INTRAVENOUS | Status: AC
  Administered 2019-09-07: 11:00:00 500 mL via INTRAVENOUS

## 2019-09-07 MED ORDER — MORPHINE SULFATE (PF) 4 MG/ML IV SOLN
4.0000 mg | Freq: Once | INTRAVENOUS | Status: AC
  Administered 2019-09-07: 4 mg via INTRAVENOUS
  Filled 2019-09-07: qty 1

## 2019-09-07 MED ORDER — MORPHINE SULFATE (PF) 4 MG/ML IV SOLN
INTRAVENOUS | Status: AC
  Filled 2019-09-07: qty 1

## 2019-09-07 MED ORDER — MORPHINE SULFATE (PF) 4 MG/ML IV SOLN
4.0000 mg | Freq: Once | INTRAVENOUS | Status: AC
  Administered 2019-09-07: 4 mg via INTRAVENOUS

## 2019-09-07 MED ORDER — ONDANSETRON HCL 4 MG/2ML IJ SOLN
INTRAMUSCULAR | Status: AC
  Filled 2019-09-07: qty 2

## 2019-09-07 NOTE — ED Notes (Signed)
Pt unable to sign due to dementia. [facility] given paperwork.

## 2019-09-07 NOTE — ED Notes (Signed)
Repeat blue top sent to lab

## 2019-09-07 NOTE — ED Notes (Signed)
Lab at bedside

## 2019-09-07 NOTE — Discharge Instructions (Addendum)
Mr. Mareno was seen by the vascular surgeon.  The wound was packed with gauze and the dressing replaced.  He should follow-up with the vascular surgeons as scheduled.  At this time, his lab work-up is reassuring.  He does not need emergency dialysis today in the hospital, but should have a make-up dialysis session tomorrow if possible.  He should return to the ER immediately for new or worsening bleeding, pain, swelling, shortness of breath, weakness, or any other new or worsening symptoms.

## 2019-09-07 NOTE — Consult Note (Signed)
Patient name: Dennis Walker MRN: [id number] DOB: 1951/04/27 Sex: male    HISTORY OF PRESENT ILLNESS:   Dennis Walker is a 69 y.o. 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  He was recently discharged from the hospital.  At dialysis today he was found to have bleeding from his amputation site and he was brought to the emergency department by EMS.  He is complaining of pain at his stump.  CURRENT MEDICATIONS:    No current facility-administered medications for this encounter.   Current Outpatient Medications  Medication Sig Dispense Refill  . acetaminophen (TYLENOL) 500 MG tablet Take 500 mg by mouth every 4 (four) hours as needed for mild pain or fever.    Marland Kitchen albuterol (VENTOLIN HFA) 108 (90 Base) MCG/ACT inhaler Inhale 2 puffs into the lungs every 4 (four) hours as needed for wheezing or shortness of breath.    Marland Kitchen aspirin EC 81 MG tablet Take 1 tablet (81 mg total) by mouth daily. (Patient taking differently: Take 81 mg by mouth daily at 6 PM. ) 90 tablet 3  . atorvastatin (LIPITOR) 10 MG tablet Take 20 mg by mouth daily at 6 PM.     . b complex vitamins tablet Take 1 tablet by mouth daily at 6 PM.    . benzocaine (ORAJEL MAXIMUM STRENGTH) 20 % GEL Use as directed 1 application in the mouth or throat 4 (four) times daily as needed (pain).     . bisacodyl (DULCOLAX) 5 MG EC tablet Take 1 tablet (5 mg total) by mouth daily as needed for moderate constipation. 30 tablet 0  . Cholecalciferol (D3-1000) 1000 units tablet Take 1,000 Units by mouth daily at 6 PM.     . clopidogrel (PLAVIX) 75 MG tablet Take 75 mg by mouth daily at 6 PM.    . epoetin alfa (EPOGEN) 10000 UNIT/ML injection Inject 0.4 mLs (4,000 Units total) into the vein Every Tuesday,Thursday,and Saturday with dialysis. 1 mL   . ferrous sulfate 325 (65 FE) MG tablet Take 325 mg by mouth daily at 6 PM.    . gabapentin (NEURONTIN) 100 MG capsule Take 1 capsule  (100 mg total) by mouth every evening. (Patient taking differently: Take 200 mg by mouth 2 (two) times daily. ) 30 capsule 0  . heparin 5000 UNIT/ML injection Inject 1 mL (5,000 Units total) into the skin every 8 (eight) hours. 1 mL   . lactulose (CHRONULAC) 10 GM/15ML solution Take 20 g by mouth at bedtime.     . lidocaine-prilocaine (EMLA) cream Apply 1 application topically Every Tuesday,Thursday,and Saturday with dialysis.     Marland Kitchen linagliptin (TRADJENTA) 5 MG TABS tablet Take 5 mg by mouth daily.    Marland Kitchen lip balm (BLISTEX) OINT Apply 1 application topically as needed for lip care.    Marland Kitchen lisinopril (PRINIVIL,ZESTRIL) 10 MG tablet Take 1 tablet (10 mg total) by mouth daily at 6 PM. (Patient taking differently: Take 20 mg by mouth daily at 6 PM. )    . metoprolol succinate (TOPROL-XL) 25 MG 24 hr tablet Take 25 mg by mouth daily at 6 PM.     . Multiple Vitamin (MULTIVITAMIN) tablet Take 1 tablet by mouth at bedtime.     . nitroGLYCERIN (NITROSTAT) 0.4 MG SL tablet Place 0.4 mg under the tongue every 5 (five) minutes as needed for chest pain.    . Nutritional Supplements (FEEDING SUPPLEMENT, NEPRO CARB STEADY,) LIQD Take 237 mLs by mouth  2 (two) times daily between meals.  0  . oxyCODONE-acetaminophen (PERCOCET) 5-325 MG tablet Take 1 tablet by mouth every 6 (six) hours as needed for severe pain. 36 tablet 0  . Polyethyl Glyc-Propyl Glyc PF (SYSTANE ULTRA PF) 0.4-0.3 % SOLN Place 1 drop into the left eye 4 (four) times daily.    . polyethylene glycol (MIRALAX / GLYCOLAX) 17 g packet Take 17 g by mouth daily as needed for moderate constipation or severe constipation. 14 each 0  . Polyvinyl Alcohol-Povidone 5-6 MG/ML SOLN Place 1 drop into the left eye 4 (four) times daily.    Marland Kitchen saccharomyces boulardii (FLORASTOR) 250 MG capsule Take 250 mg by mouth daily.    . sertraline (ZOLOFT) 100 MG tablet Take 100 mg by mouth at bedtime.     . sevelamer carbonate (RENVELA) 800 MG tablet Take 800 mg by mouth 2 (two)  times daily. (with snacks)    . sevelamer carbonate (RENVELA) 800 MG tablet Take 1,600 mg by mouth 3 (three) times daily with meals.    . Skin Protectants, Misc. (EUCERIN) cream Apply 1 application topically 2 (two) times daily as needed (rash).     . vancomycin (VANCOREADY) 500 MG/100ML IVPB Inject 100 mLs (500 mg total) into the vein Every Tuesday,Thursday,and Saturday with dialysis. 4000 mL     REVIEW OF SYSTEMS:   [X]  denotes positive finding, [ ]  denotes negative finding Cardiac  Comments:  Chest pain or chest pressure:    Shortness of breath upon exertion:    Short of breath when lying flat:    Irregular heart rhythm:    Constitutional    Fever or chills:      PHYSICAL EXAM:   Vitals:   09/07/19 0631 09/07/19 0800 09/07/19 0904 09/07/19 1109  BP:   (!) 104/52 (!) 101/39  Pulse:  71    Resp:      Temp:      TempSrc:      SpO2:  100%    Weight: 85.7 kg     Height: 5\' 11"  (1.803 m)       GENERAL: The patient is a well-nourished male, in no acute distress. The vital signs are documented above. CARDIOVASCULAR: There is a regular rate and rhythm. PULMONARY: Non-labored respirations There is a significant amount of blood on his close.  The staple line is intact however in the midportion, there does appear to be some drainage.  There is no erythema.  STUDIES:   None   MEDICAL ISSUES:   Status post left above-knee amputation: I removed about 5 staples from the midportion of the incision where there was drainage.  I probed this area with a Q-tip.  It went and to a small space approximately 2 x 2 cm.  It did not track anywhere else.  There is currently no active bleeding.  I packed the wound with 4 x 4 gauze and wrapped it with a Kerlix.  The patient is safe to return to his facility.  He will keep his regularly scheduled follow-up appointment.  He will need daily dressing changes at his facility.  Leia Alf, MD, FACS [facility]  [phone] Pager [phone]

## 2019-09-07 NOTE — ED Triage Notes (Signed)
Pt arrives via [facility] from dialysis for bleeding at his amputation site. Pt's bandage came off while at dialysis this AM and EMS was called. Pt did not receive dialysis. Pt is x 1 week post-op from left leg amputation.

## 2019-09-07 NOTE — ED Notes (Addendum)
Dr Cherylann Banas okay with giving pt ice chips- half a cup of ice chips given- pt given update on plan of care
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
## 2019-09-07 NOTE — ED Notes (Signed)
Lab to send someone to collect blue top

## 2019-09-07 NOTE — ED Notes (Signed)
TV turned on for pt.

## 2019-09-07 NOTE — ED Notes (Signed)
Vascular MD at bedside.

## 2019-09-09 ENCOUNTER — Inpatient Hospital Stay
Admission: EM | Admit: 2019-09-09 | Discharge: 2019-10-03 | DRG: 871 | Disposition: E | Payer: Medicare Other | Attending: Internal Medicine | Admitting: Internal Medicine

## 2019-09-09 ENCOUNTER — Inpatient Hospital Stay: Payer: Medicare Other

## 2019-09-09 ENCOUNTER — Encounter: Payer: Self-pay | Admitting: Internal Medicine

## 2019-09-09 ENCOUNTER — Emergency Department: Payer: Medicare Other

## 2019-09-09 ENCOUNTER — Other Ambulatory Visit: Payer: Self-pay

## 2019-09-09 DIAGNOSIS — R6521 Severe sepsis with septic shock: Secondary | ICD-10-CM | POA: Diagnosis present

## 2019-09-09 DIAGNOSIS — E1122 Type 2 diabetes mellitus with diabetic chronic kidney disease: Secondary | ICD-10-CM | POA: Diagnosis present

## 2019-09-09 DIAGNOSIS — J9690 Respiratory failure, unspecified, unspecified whether with hypoxia or hypercapnia: Secondary | ICD-10-CM | POA: Diagnosis present

## 2019-09-09 DIAGNOSIS — D631 Anemia in chronic kidney disease: Secondary | ICD-10-CM | POA: Diagnosis present

## 2019-09-09 DIAGNOSIS — Z20822 Contact with and (suspected) exposure to covid-19: Secondary | ICD-10-CM | POA: Diagnosis present

## 2019-09-09 DIAGNOSIS — J9601 Acute respiratory failure with hypoxia: Secondary | ICD-10-CM | POA: Diagnosis present

## 2019-09-09 DIAGNOSIS — I5033 Acute on chronic diastolic (congestive) heart failure: Secondary | ICD-10-CM | POA: Diagnosis present

## 2019-09-09 DIAGNOSIS — J9602 Acute respiratory failure with hypercapnia: Secondary | ICD-10-CM | POA: Diagnosis not present

## 2019-09-09 DIAGNOSIS — I252 Old myocardial infarction: Secondary | ICD-10-CM

## 2019-09-09 DIAGNOSIS — Z515 Encounter for palliative care: Secondary | ICD-10-CM | POA: Diagnosis not present

## 2019-09-09 DIAGNOSIS — N186 End stage renal disease: Secondary | ICD-10-CM | POA: Diagnosis present

## 2019-09-09 DIAGNOSIS — L89302 Pressure ulcer of unspecified buttock, stage 2: Secondary | ICD-10-CM | POA: Diagnosis present

## 2019-09-09 DIAGNOSIS — Z79899 Other long term (current) drug therapy: Secondary | ICD-10-CM

## 2019-09-09 DIAGNOSIS — D649 Anemia, unspecified: Secondary | ICD-10-CM

## 2019-09-09 DIAGNOSIS — E162 Hypoglycemia, unspecified: Secondary | ICD-10-CM

## 2019-09-09 DIAGNOSIS — Z452 Encounter for adjustment and management of vascular access device: Secondary | ICD-10-CM

## 2019-09-09 DIAGNOSIS — E785 Hyperlipidemia, unspecified: Secondary | ICD-10-CM | POA: Diagnosis present

## 2019-09-09 DIAGNOSIS — F329 Major depressive disorder, single episode, unspecified: Secondary | ICD-10-CM | POA: Diagnosis present

## 2019-09-09 DIAGNOSIS — A419 Sepsis, unspecified organism: Secondary | ICD-10-CM | POA: Diagnosis present

## 2019-09-09 DIAGNOSIS — Z66 Do not resuscitate: Secondary | ICD-10-CM | POA: Diagnosis present

## 2019-09-09 DIAGNOSIS — E11319 Type 2 diabetes mellitus with unspecified diabetic retinopathy without macular edema: Secondary | ICD-10-CM | POA: Diagnosis present

## 2019-09-09 DIAGNOSIS — E872 Acidosis, unspecified: Secondary | ICD-10-CM

## 2019-09-09 DIAGNOSIS — Z89511 Acquired absence of right leg below knee: Secondary | ICD-10-CM

## 2019-09-09 DIAGNOSIS — E11649 Type 2 diabetes mellitus with hypoglycemia without coma: Secondary | ICD-10-CM | POA: Diagnosis present

## 2019-09-09 DIAGNOSIS — I69354 Hemiplegia and hemiparesis following cerebral infarction affecting left non-dominant side: Secondary | ICD-10-CM

## 2019-09-09 DIAGNOSIS — E1152 Type 2 diabetes mellitus with diabetic peripheral angiopathy with gangrene: Secondary | ICD-10-CM | POA: Diagnosis present

## 2019-09-09 DIAGNOSIS — R40243 Glasgow coma scale score 3-8, unspecified time: Secondary | ICD-10-CM

## 2019-09-09 DIAGNOSIS — F419 Anxiety disorder, unspecified: Secondary | ICD-10-CM | POA: Diagnosis present

## 2019-09-09 DIAGNOSIS — R579 Shock, unspecified: Secondary | ICD-10-CM | POA: Diagnosis not present

## 2019-09-09 DIAGNOSIS — G92 Toxic encephalopathy: Secondary | ICD-10-CM | POA: Diagnosis present

## 2019-09-09 DIAGNOSIS — I132 Hypertensive heart and chronic kidney disease with heart failure and with stage 5 chronic kidney disease, or end stage renal disease: Secondary | ICD-10-CM | POA: Diagnosis present

## 2019-09-09 DIAGNOSIS — I2510 Atherosclerotic heart disease of native coronary artery without angina pectoris: Secondary | ICD-10-CM | POA: Diagnosis present

## 2019-09-09 DIAGNOSIS — Z992 Dependence on renal dialysis: Secondary | ICD-10-CM

## 2019-09-09 DIAGNOSIS — I255 Ischemic cardiomyopathy: Secondary | ICD-10-CM | POA: Diagnosis present

## 2019-09-09 DIAGNOSIS — J45909 Unspecified asthma, uncomplicated: Secondary | ICD-10-CM | POA: Diagnosis present

## 2019-09-09 DIAGNOSIS — Z794 Long term (current) use of insulin: Secondary | ICD-10-CM

## 2019-09-09 DIAGNOSIS — Z7401 Bed confinement status: Secondary | ICD-10-CM

## 2019-09-09 DIAGNOSIS — Z89612 Acquired absence of left leg above knee: Secondary | ICD-10-CM

## 2019-09-09 DIAGNOSIS — Z7902 Long term (current) use of antithrombotics/antiplatelets: Secondary | ICD-10-CM

## 2019-09-09 DIAGNOSIS — Z0189 Encounter for other specified special examinations: Secondary | ICD-10-CM

## 2019-09-09 DIAGNOSIS — L8990 Pressure ulcer of unspecified site, unspecified stage: Secondary | ICD-10-CM | POA: Insufficient documentation

## 2019-09-09 DIAGNOSIS — I248 Other forms of acute ischemic heart disease: Secondary | ICD-10-CM | POA: Diagnosis present

## 2019-09-09 DIAGNOSIS — R778 Other specified abnormalities of plasma proteins: Secondary | ICD-10-CM | POA: Diagnosis not present

## 2019-09-09 DIAGNOSIS — Z7189 Other specified counseling: Secondary | ICD-10-CM | POA: Diagnosis not present

## 2019-09-09 DIAGNOSIS — R570 Cardiogenic shock: Secondary | ICD-10-CM | POA: Diagnosis present

## 2019-09-09 DIAGNOSIS — R7881 Bacteremia: Secondary | ICD-10-CM | POA: Diagnosis not present

## 2019-09-09 DIAGNOSIS — Z89512 Acquired absence of left leg below knee: Secondary | ICD-10-CM

## 2019-09-09 DIAGNOSIS — M1990 Unspecified osteoarthritis, unspecified site: Secondary | ICD-10-CM | POA: Diagnosis present

## 2019-09-09 DIAGNOSIS — B182 Chronic viral hepatitis C: Secondary | ICD-10-CM | POA: Diagnosis present

## 2019-09-09 DIAGNOSIS — I361 Nonrheumatic tricuspid (valve) insufficiency: Secondary | ICD-10-CM | POA: Diagnosis not present

## 2019-09-09 DIAGNOSIS — H547 Unspecified visual loss: Secondary | ICD-10-CM | POA: Diagnosis present

## 2019-09-09 DIAGNOSIS — Z7982 Long term (current) use of aspirin: Secondary | ICD-10-CM

## 2019-09-09 DIAGNOSIS — E1140 Type 2 diabetes mellitus with diabetic neuropathy, unspecified: Secondary | ICD-10-CM | POA: Diagnosis present

## 2019-09-09 LAB — BASIC METABOLIC PANEL
Anion gap: 29 — ABNORMAL HIGH (ref 5–15)
Anion gap: 21 — ABNORMAL HIGH (ref 5–15)
BUN: 55 mg/dL — ABNORMAL HIGH (ref 8–23)
BUN: 58 mg/dL — ABNORMAL HIGH (ref 8–23)
CO2: 13 mmol/L — ABNORMAL LOW (ref 22–32)
CO2: 23 mmol/L (ref 22–32)
Calcium: 7.1 mg/dL — ABNORMAL LOW (ref 8.9–10.3)
Calcium: 7.4 mg/dL — ABNORMAL LOW (ref 8.9–10.3)
Chloride: 93 mmol/L — ABNORMAL LOW (ref 98–111)
Chloride: 93 mmol/L — ABNORMAL LOW (ref 98–111)
Creatinine, Ser: 9.79 mg/dL — ABNORMAL HIGH (ref 0.61–1.24)
Creatinine, Ser: 9.19 mg/dL — ABNORMAL HIGH (ref 0.61–1.24)
GFR calc Af Amer: 6 mL/min — ABNORMAL LOW (ref >60)
GFR calc Af Amer: 6 mL/min — ABNORMAL LOW (ref >60)
GFR calc non Af Amer: 5 mL/min — ABNORMAL LOW (ref >60)
GFR calc non Af Amer: 5 mL/min — ABNORMAL LOW (ref >60)
Glucose, Bld: 433 mg/dL — ABNORMAL HIGH (ref 70–99)
Glucose, Bld: 288 mg/dL — ABNORMAL HIGH (ref 70–99)
Potassium: 5.5 mmol/L — ABNORMAL HIGH (ref 3.5–5.1)
Potassium: 4.6 mmol/L (ref 3.5–5.1)
Sodium: 135 mmol/L (ref 135–145)
Sodium: 137 mmol/L (ref 135–145)

## 2019-09-09 LAB — COMPREHENSIVE METABOLIC PANEL
ALT: 31 U/L (ref 0–44)
AST: 56 U/L — ABNORMAL HIGH (ref 15–41)
Albumin: 2 g/dL — ABNORMAL LOW (ref 3.5–5.0)
Alkaline Phosphatase: 107 U/L (ref 38–126)
Anion gap: 29 — ABNORMAL HIGH (ref 5–15)
BUN: 47 mg/dL — ABNORMAL HIGH (ref 8–23)
CO2: 12 mmol/L — ABNORMAL LOW (ref 22–32)
Calcium: 7 mg/dL — ABNORMAL LOW (ref 8.9–10.3)
Chloride: 101 mmol/L (ref 98–111)
Creatinine, Ser: 8.46 mg/dL — ABNORMAL HIGH (ref 0.61–1.24)
GFR calc Af Amer: 7 mL/min — ABNORMAL LOW (ref >60)
GFR calc non Af Amer: 6 mL/min — ABNORMAL LOW (ref >60)
Glucose, Bld: 119 mg/dL — ABNORMAL HIGH (ref 70–99)
Potassium: 5.4 mmol/L — ABNORMAL HIGH (ref 3.5–5.1)
Sodium: 142 mmol/L (ref 135–145)
Total Bilirubin: 1.4 mg/dL — ABNORMAL HIGH (ref 0.3–1.2)
Total Protein: 5.2 g/dL — ABNORMAL LOW (ref 6.5–8.1)

## 2019-09-09 LAB — CBC
HCT: 30.6 % — ABNORMAL LOW (ref 39.0–52.0)
Hemoglobin: 9.1 g/dL — ABNORMAL LOW (ref 13.0–17.0)
MCH: 28 pg (ref 26.0–34.0)
MCHC: 29.7 g/dL — ABNORMAL LOW (ref 30.0–36.0)
MCV: 94.2 fL (ref 80.0–100.0)
Platelets: 323 10*3/uL (ref 150–400)
RBC: 3.25 MIL/uL — ABNORMAL LOW (ref 4.22–5.81)
RDW: 22.2 % — ABNORMAL HIGH (ref 11.5–15.5)
WBC: 24.3 10*3/uL — ABNORMAL HIGH (ref 4.0–10.5)
nRBC: 8.6 % — ABNORMAL HIGH (ref 0.0–0.2)

## 2019-09-09 LAB — CBC WITH DIFFERENTIAL/PLATELET
Abs Immature Granulocytes: 0.52 10*3/uL — ABNORMAL HIGH (ref 0.00–0.07)
Basophils Absolute: 0 10*3/uL (ref 0.0–0.1)
Basophils Relative: 0 %
Eosinophils Absolute: 0 10*3/uL (ref 0.0–0.5)
Eosinophils Relative: 0 %
HCT: 22.3 % — ABNORMAL LOW (ref 39.0–52.0)
Hemoglobin: 6.1 g/dL — ABNORMAL LOW (ref 13.0–17.0)
Immature Granulocytes: 4 %
Lymphocytes Relative: 8 %
Lymphs Abs: 1.1 10*3/uL (ref 0.7–4.0)
MCH: 27.2 pg (ref 26.0–34.0)
MCHC: 27.4 g/dL — ABNORMAL LOW (ref 30.0–36.0)
MCV: 99.6 fL (ref 80.0–100.0)
Monocytes Absolute: 0.9 10*3/uL (ref 0.1–1.0)
Monocytes Relative: 7 %
Neutro Abs: 10.2 10*3/uL — ABNORMAL HIGH (ref 1.7–7.7)
Neutrophils Relative %: 81 %
Platelets: 178 10*3/uL (ref 150–400)
RBC: 2.24 MIL/uL — ABNORMAL LOW (ref 4.22–5.81)
RDW: 22.4 % — ABNORMAL HIGH (ref 11.5–15.5)
Smear Review: NORMAL
WBC: 12.6 10*3/uL — ABNORMAL HIGH (ref 4.0–10.5)
nRBC: 8.9 % — ABNORMAL HIGH (ref 0.0–0.2)

## 2019-09-09 LAB — RENAL FUNCTION PANEL
Albumin: 2.6 g/dL — ABNORMAL LOW (ref 3.5–5.0)
Anion gap: 19 — ABNORMAL HIGH (ref 5–15)
BUN: 59 mg/dL — ABNORMAL HIGH (ref 8–23)
CO2: 24 mmol/L (ref 22–32)
Calcium: 7.4 mg/dL — ABNORMAL LOW (ref 8.9–10.3)
Chloride: 93 mmol/L — ABNORMAL LOW (ref 98–111)
Creatinine, Ser: 10.02 mg/dL — ABNORMAL HIGH (ref 0.61–1.24)
GFR calc Af Amer: 6 mL/min — ABNORMAL LOW (ref >60)
GFR calc non Af Amer: 5 mL/min — ABNORMAL LOW (ref >60)
Glucose, Bld: 413 mg/dL — ABNORMAL HIGH (ref 70–99)
Phosphorus: 7 mg/dL — ABNORMAL HIGH (ref 2.5–4.6)
Potassium: 5.5 mmol/L — ABNORMAL HIGH (ref 3.5–5.1)
Sodium: 136 mmol/L (ref 135–145)

## 2019-09-09 LAB — URINALYSIS, COMPLETE (UACMP) WITH MICROSCOPIC
Bacteria, UA: NONE SEEN
Bilirubin Urine: NEGATIVE
Glucose, UA: 50 mg/dL — AB
Ketones, ur: NEGATIVE mg/dL
Nitrite: NEGATIVE
Protein, ur: >=300 mg/dL — AB
RBC / HPF: >50 RBC/hpf — ABNORMAL HIGH (ref 0–5)
Specific Gravity, Urine: 1.02 (ref 1.005–1.030)
Squamous Epithelial / HPF: NONE SEEN (ref 0–5)
WBC, UA: >50 WBC/hpf — ABNORMAL HIGH (ref 0–5)
pH: 5 (ref 5.0–8.0)

## 2019-09-09 LAB — GLUCOSE, CAPILLARY
Glucose-Capillary: 60 mg/dL — ABNORMAL LOW (ref 70–99)
Glucose-Capillary: <10 mg/dL — CL (ref 70–99)
Glucose-Capillary: 347 mg/dL — ABNORMAL HIGH (ref 70–99)
Glucose-Capillary: 407 mg/dL — ABNORMAL HIGH (ref 70–99)
Glucose-Capillary: 279 mg/dL — ABNORMAL HIGH (ref 70–99)
Glucose-Capillary: 340 mg/dL — ABNORMAL HIGH (ref 70–99)
Glucose-Capillary: 151 mg/dL — ABNORMAL HIGH (ref 70–99)
Glucose-Capillary: 136 mg/dL — ABNORMAL HIGH (ref 70–99)
Glucose-Capillary: 36 mg/dL — CL (ref 70–99)

## 2019-09-09 LAB — BLOOD GAS, ARTERIAL
Acid-base deficit: 19.4 mmol/L — ABNORMAL HIGH (ref 0.0–2.0)
Bicarbonate: 9.5 mmol/L — ABNORMAL LOW (ref 20.0–28.0)
O2 Saturation: 80.2 %
Patient temperature: 37
pCO2 arterial: 32 mmHg (ref 32.0–48.0)
pH, Arterial: 7.08 — CL (ref 7.350–7.450)
pO2, Arterial: 63 mmHg — ABNORMAL LOW (ref 83.0–108.0)

## 2019-09-09 LAB — LACTIC ACID, PLASMA
Lactic Acid, Venous: >11 mmol/L (ref 0.5–1.9)
Lactic Acid, Venous: >11 mmol/L (ref 0.5–1.9)

## 2019-09-09 LAB — MAGNESIUM: Magnesium: 2.8 mg/dL — ABNORMAL HIGH (ref 1.7–2.4)

## 2019-09-09 LAB — TROPONIN I (HIGH SENSITIVITY)
Troponin I (High Sensitivity): 445 ng/L (ref <18)
Troponin I (High Sensitivity): 622 ng/L (ref <18)
Troponin I (High Sensitivity): 4042 ng/L (ref <18)

## 2019-09-09 LAB — VANCOMYCIN, RANDOM: Vancomycin Rm: 39

## 2019-09-09 LAB — PHOSPHORUS: Phosphorus: 4.3 mg/dL (ref 2.5–4.6)

## 2019-09-09 LAB — PROTIME-INR
INR: 8 (ref 0.8–1.2)
Prothrombin Time: 67.5 seconds — ABNORMAL HIGH (ref 11.4–15.2)

## 2019-09-09 LAB — MRSA PCR SCREENING: MRSA by PCR: NEGATIVE

## 2019-09-09 LAB — PATHOLOGIST SMEAR REVIEW

## 2019-09-09 LAB — POC SARS CORONAVIRUS 2 AG -  ED: SARS Coronavirus 2 Ag: NEGATIVE

## 2019-09-09 MED ORDER — NOREPINEPHRINE 4 MG/250ML-% IV SOLN
0.0000 ug/min | INTRAVENOUS | Status: DC
  Administered 2019-09-09: 15 ug/min via INTRAVENOUS
  Filled 2019-09-09 (×2): qty 250

## 2019-09-09 MED ORDER — PNEUMOCOCCAL VAC POLYVALENT 25 MCG/0.5ML IJ INJ: 0.5000 mL | INJECTION | INTRAMUSCULAR | Status: DC

## 2019-09-09 MED ORDER — VANCOMYCIN HCL IN DEXTROSE 1-5 GM/200ML-% IV SOLN
1000.0000 mg | Freq: Once | INTRAVENOUS | Status: DC
  Filled 2019-09-09: qty 200

## 2019-09-09 MED ORDER — ETOMIDATE 2 MG/ML IV SOLN
20.0000 mg | Freq: Once | INTRAVENOUS | Status: AC
  Administered 2019-09-09: 20 mg via INTRAVENOUS

## 2019-09-09 MED ORDER — INSULIN GLARGINE 100 UNIT/ML ~~LOC~~ SOLN
20.0000 [IU] | Freq: Once | SUBCUTANEOUS | Status: DC
  Filled 2019-09-09: qty 0.2

## 2019-09-09 MED ORDER — ORAL CARE MOUTH RINSE
15.0000 mL | OROMUCOSAL | Status: DC
  Administered 2019-09-09 – 2019-09-12 (×26): 15 mL via OROMUCOSAL

## 2019-09-09 MED ORDER — PANTOPRAZOLE SODIUM 40 MG IV SOLR
40.0000 mg | Freq: Every day | INTRAVENOUS | Status: DC
  Administered 2019-09-09 – 2019-09-11 (×3): 40 mg via INTRAVENOUS
  Filled 2019-09-09 (×3): qty 40

## 2019-09-09 MED ORDER — FENTANYL 2500MCG IN NS 250ML (10MCG/ML) PREMIX INFUSION: 0.0000 ug/h | INTRAVENOUS | Status: DC

## 2019-09-09 MED ORDER — MIDAZOLAM HCL 2 MG/2ML IJ SOLN: 2.0000 mg | Freq: Once | INTRAMUSCULAR | Status: AC

## 2019-09-09 MED ORDER — DEXTROSE 50 % IV SOLN
0.0000 mL | INTRAVENOUS | Status: DC | PRN
  Administered 2019-09-09: 50 mL via INTRAVENOUS
  Filled 2019-09-09: qty 50

## 2019-09-09 MED ORDER — FENTANYL BOLUS VIA INFUSION
25.0000 ug | INTRAVENOUS | Status: DC | PRN
  Filled 2019-09-09: qty 25

## 2019-09-09 MED ORDER — HEPARIN SODIUM (PORCINE) 1000 UNIT/ML DIALYSIS
1000.0000 [IU] | INTRAMUSCULAR | Status: DC | PRN
  Filled 2019-09-09: qty 6

## 2019-09-09 MED ORDER — PIPERACILLIN-TAZOBACTAM 3.375 G IVPB
3.3750 g | Freq: Two times a day (BID) | INTRAVENOUS | Status: DC
  Filled 2019-09-09 (×2): qty 50

## 2019-09-09 MED ORDER — NOREPINEPHRINE 4 MG/250ML-% IV SOLN
INTRAVENOUS | Status: AC
  Administered 2019-09-09: 7 ug/min via INTRAVENOUS
  Filled 2019-09-09: qty 250

## 2019-09-09 MED ORDER — PIPERACILLIN-TAZOBACTAM 3.375 G IVPB
3.3750 g | Freq: Three times a day (TID) | INTRAVENOUS | Status: DC
  Administered 2019-09-09 – 2019-09-12 (×9): 3.375 g via INTRAVENOUS
  Filled 2019-09-09 (×11): qty 50

## 2019-09-09 MED ORDER — ROCURONIUM BROMIDE 50 MG/5ML IV SOLN
70.0000 mg | Freq: Once | INTRAVENOUS | Status: AC
  Administered 2019-09-09: 70 mg via INTRAVENOUS
  Filled 2019-09-09: qty 7

## 2019-09-09 MED ORDER — FENTANYL CITRATE (PF) 100 MCG/2ML IJ SOLN
100.0000 ug | INTRAMUSCULAR | Status: DC | PRN
  Administered 2019-09-09 (×2): 100 ug via INTRAVENOUS
  Filled 2019-09-09 (×2): qty 2

## 2019-09-09 MED ORDER — DEXTROSE 10 % IV SOLN: INTRAVENOUS | Status: DC

## 2019-09-09 MED ORDER — DEXTROSE 50 % IV SOLN
1.0000 | Freq: Once | INTRAVENOUS | Status: AC
  Administered 2019-09-09: 50 mL via INTRAVENOUS

## 2019-09-09 MED ORDER — SODIUM BICARBONATE 8.4 % IV SOLN
50.0000 meq | Freq: Once | INTRAVENOUS | Status: AC
  Administered 2019-09-09: 50 meq via INTRAVENOUS

## 2019-09-09 MED ORDER — SODIUM CHLORIDE 0.9% FLUSH
3.0000 mL | Freq: Two times a day (BID) | INTRAVENOUS | Status: DC
  Administered 2019-09-09 – 2019-09-11 (×6): 3 mL via INTRAVENOUS

## 2019-09-09 MED ORDER — PRISMASOL BGK 4/2.5 32-4-2.5 MEQ/L REPLACEMENT SOLN
Status: DC
  Filled 2019-09-09 (×4): qty 5000

## 2019-09-09 MED ORDER — SODIUM CHLORIDE 0.9% FLUSH: 3.0000 mL | INTRAVENOUS | Status: DC | PRN

## 2019-09-09 MED ORDER — PIPERACILLIN-TAZOBACTAM 3.375 G IVPB 30 MIN
3.3750 g | Freq: Once | INTRAVENOUS | Status: AC
  Administered 2019-09-09: 3.375 g via INTRAVENOUS
  Filled 2019-09-09: qty 50

## 2019-09-09 MED ORDER — ONDANSETRON HCL 4 MG/2ML IJ SOLN: 4.0000 mg | Freq: Four times a day (QID) | INTRAMUSCULAR | Status: DC | PRN

## 2019-09-09 MED ORDER — VASOPRESSIN 20 UNIT/ML IV SOLN
0.0300 [IU]/min | INTRAVENOUS | Status: DC
  Administered 2019-09-09 – 2019-09-10 (×2): 0.03 [IU]/min via INTRAVENOUS
  Filled 2019-09-09 (×4): qty 2

## 2019-09-09 MED ORDER — EPINEPHRINE PF 1 MG/ML IJ SOLN
1.0000 mg | Freq: Once | INTRAMUSCULAR | Status: AC
  Administered 2019-09-09: 1 mg via INTRAVENOUS

## 2019-09-09 MED ORDER — INSULIN REGULAR(HUMAN) IN NACL 100-0.9 UT/100ML-% IV SOLN
INTRAVENOUS | Status: DC
  Administered 2019-09-09: 9.5 [IU]/h via INTRAVENOUS
  Filled 2019-09-09: qty 100

## 2019-09-09 MED ORDER — CHLORHEXIDINE GLUCONATE CLOTH 2 % EX PADS
6.0000 | MEDICATED_PAD | Freq: Every day | CUTANEOUS | Status: DC
  Administered 2019-09-10 – 2019-09-12 (×3): 6 via TOPICAL

## 2019-09-09 MED ORDER — ACETAMINOPHEN 325 MG PO TABS: 650.0000 mg | ORAL_TABLET | ORAL | Status: DC | PRN

## 2019-09-09 MED ORDER — DEXTROSE IN LACTATED RINGERS 5 % IV SOLN: INTRAVENOUS | Status: DC

## 2019-09-09 MED ORDER — PHENYLEPHRINE CONCENTRATED 100MG/250ML (0.4 MG/ML) INFUSION SIMPLE
0.0000 ug/min | INTRAVENOUS | Status: DC
  Administered 2019-09-09: 20 ug/min via INTRAVENOUS
  Administered 2019-09-10: 240 ug/min via INTRAVENOUS
  Administered 2019-09-10: 300 ug/min via INTRAVENOUS
  Administered 2019-09-10: 250 ug/min via INTRAVENOUS
  Administered 2019-09-11: 150 ug/min via INTRAVENOUS
  Administered 2019-09-11: 210 ug/min via INTRAVENOUS
  Filled 2019-09-09 (×7): qty 250

## 2019-09-09 MED ORDER — INSULIN ASPART 100 UNIT/ML ~~LOC~~ SOLN: 0.0000 [IU] | SUBCUTANEOUS | Status: DC

## 2019-09-09 MED ORDER — INFLUENZA VAC A&B SA ADJ QUAD 0.5 ML IM PRSY
0.5000 mL | PREFILLED_SYRINGE | INTRAMUSCULAR | Status: DC
  Filled 2019-09-09: qty 0.5

## 2019-09-09 MED ORDER — VANCOMYCIN HCL 1250 MG/250ML IV SOLN
1250.0000 mg | INTRAVENOUS | Status: DC
  Administered 2019-09-09 – 2019-09-11 (×3): 1250 mg via INTRAVENOUS
  Filled 2019-09-09 (×4): qty 250

## 2019-09-09 MED ORDER — DEXTROSE 50 % IV SOLN
25.0000 g | Freq: Once | INTRAVENOUS | Status: AC
  Administered 2019-09-09: 25 g via INTRAVENOUS

## 2019-09-09 MED ORDER — FAMOTIDINE IN NACL 20-0.9 MG/50ML-% IV SOLN: 20.0000 mg | Freq: Every day | INTRAVENOUS | Status: DC

## 2019-09-09 MED ORDER — PRISMASOL BGK 4/2.5 32-4-2.5 MEQ/L IV SOLN
INTRAVENOUS | Status: DC
  Filled 2019-09-09 (×9): qty 5000

## 2019-09-09 MED ORDER — NOREPINEPHRINE 16 MG/250ML-% IV SOLN
0.0000 ug/min | INTRAVENOUS | Status: DC
  Administered 2019-09-09: 15 ug/min via INTRAVENOUS
  Filled 2019-09-09 (×2): qty 250

## 2019-09-09 MED ORDER — FENTANYL 2500MCG IN NS 250ML (10MCG/ML) PREMIX INFUSION
0.0000 ug/h | INTRAVENOUS | Status: DC
  Administered 2019-09-10: 200 ug/h via INTRAVENOUS
  Administered 2019-09-10: 400 ug/h via INTRAVENOUS
  Administered 2019-09-11 – 2019-09-12 (×4): 300 ug/h via INTRAVENOUS
  Filled 2019-09-09 (×6): qty 250

## 2019-09-09 MED ORDER — FENTANYL 2500MCG IN NS 250ML (10MCG/ML) PREMIX INFUSION
INTRAVENOUS | Status: AC
  Administered 2019-09-09: 100 ug/h via INTRAVENOUS
  Filled 2019-09-09: qty 250

## 2019-09-09 MED ORDER — FAMOTIDINE IN NACL 20-0.9 MG/50ML-% IV SOLN: 20.0000 mg | Freq: Two times a day (BID) | INTRAVENOUS | Status: DC

## 2019-09-09 MED ORDER — SODIUM CHLORIDE 0.9 % IV SOLN: 250.0000 mL | INTRAVENOUS | Status: DC | PRN

## 2019-09-09 MED ORDER — LACTATED RINGERS IV SOLN: INTRAVENOUS | Status: DC

## 2019-09-09 MED ORDER — CHLORHEXIDINE GLUCONATE 0.12% ORAL RINSE (MEDLINE KIT)
15.0000 mL | Freq: Two times a day (BID) | OROMUCOSAL | Status: DC
  Administered 2019-09-09 – 2019-09-11 (×5): 15 mL via OROMUCOSAL

## 2019-09-09 MED ORDER — MIDAZOLAM HCL 2 MG/2ML IJ SOLN
INTRAMUSCULAR | Status: AC
  Administered 2019-09-09: 2 mg via INTRAVENOUS
  Filled 2019-09-09: qty 2

## 2019-09-09 NOTE — Progress Notes (Signed)
Patient has converted to A.Fib. rate in 120s. Will notify MD.

## 2019-09-09 NOTE — Progress Notes (Addendum)
1015 Patient received from ED intubated and unresponsive. The patient did not react to pain in extremities or sternal rub.. Patients GCS was 3 on admission with no reflexes noted except a cough x 1 with suctioning. He did not open his eyes or try to communicate with anyone. 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 No corneal reflex was present in the left eye and it was 2 mm and fixed.Rectal temperature 97.1 . All extremities cold . Blood sugar 279.

## 2019-09-09 NOTE — Progress Notes (Signed)
Clinical status relayed to family  Updated and notified of patients medical condition-  Progressive multiorgan failure with very low chance of meaningful recovery.  Patient is in dying  Process.  Family understands the situation.  They have consented and agreed to DNR.   Family are satisfied with Plan of action and management. All questions answered  Corrin Parker, M.D.  [facility]  Medical Director [place] Director [facility] Cardio-Pulmonary Department

## 2019-09-09 NOTE — Progress Notes (Signed)
Transported patient to and from CT with no issues.

## 2019-09-09 NOTE — Procedures (Signed)
Central Venous Dailysis Catheter Placement: TRIPLE LUMEN  Indication: Hemo Dialysis/CRRT   Consent:emergent   Hand washing performed prior to starting the procedure.   Procedure: An active timeout was performed and correct patient, name, & ID confirmed. Patient was positioned correctly for central venous access. Patient was prepped using strict sterile technique including chlorohexadine preps, sterile drape, sterile gown and sterile gloves.  The area was prepped, draped and anesthetized in the usual sterile manner. Patient comfort was obtained.    A Double lumen catheter was placed in LEFT IJ  Vein There was good blood return, catheter caps were placed on lumens, catheter flushed easily, the line was secured and a sterile dressing and BIO-PATCH applied.   Ultrasound was used to visualize vasculature and guidance of needle.   Number of Attempts: 1 Complications:none  Estimated Blood Loss: none Operator: Fiorela Pelzer.   Oyinkansola Truax David Robbie Rideaux, M.D.  [facility]  Medical Director [facility] [place] Medical Director [facility] Cardio-Pulmonary Department     

## 2019-09-09 NOTE — ED Provider Notes (Signed)
Patient was seen and examined by me, is not on sedation with no purposeful movement at this time.  Right-sided EJ was placed by me, he has had recurrent low blood sugars and we gave IV D50 x2.  Urine does look infected we have sent cultures.  I have ordered broad-spectrum antibiotics.  He is acidemic, likely from same.  He also also anemic with a hemoglobin of 6.  Have ordered 1 unit of blood for him.  I will discuss with nephrology in the ICU for admission.   Earleen Newport, MD 09/17/2019 [phone]

## 2019-09-09 NOTE — Progress Notes (Signed)
Transported patient to the ICU with no issues.

## 2019-09-09 NOTE — Consult Note (Signed)
Consultation Note Date: 09/08/2019   Patient Name: Dennis Walker  DOB: 08-28-1950  MRN: [id number]  Age / Sex: 69 y.o., male  PCP: Juluis Pitch, MD Referring Physician: Flora Lipps, MD  Reason for Consultation: Establishing goals of care  HPI/Patient Profile: 69 y.o.malethe history of ESRD on HD, diabetes, recent AKA of the left lower extremity, hypertension, hyperlipidemia, peripheral vascular disease on Plavix who presents from his nursing home for unresponsiveness.  Clinical Assessment and Goals of Care: Patient is resting in bed on ventilator with nursing setting up CRRT. Met with daughter [name] in waiting room; she voices the recent meeting with our team, but recognizes they are in a different place now as far as his status. She states she feels numb in this experience. [name] is a cardiac nurse at [employer].   She discusses how kind and loving he is and how he would do anything for anyone. He is a man of faith. Latoya's mother is a Theme park manager, and she is present as well.   She states she is concerned about making the wrong decision. Her mother counsels her on God's providence. She does not want her father to suffer.   Plans for initiating CRRT and to see how he does.    SUMMARY OF RECOMMENDATIONS   Plans for CRRT.  Family taking this one step at a time.  Palliative will continue to follow.    Prognosis:   Poor overall.       Primary Diagnoses: Present on Admission: . Respiratory failure ([place])   I have reviewed the medical record, interviewed the patient and family, and examined the patient. The following aspects are pertinent.  Past Medical History:  Diagnosis Date  . Anginal pain ([place])   . Anxiety   . Arthritis   . Asthma   . CHF (congestive heart failure) ([place])   . Coronary artery disease   . Depression   . Diabetes mellitus without complication ([place])   . Dyspnea   . ESRD (end  stage renal disease) ([place])    On Tuesday, Thursday and Saturday dialysis  . Heart murmur   . Hyperlipidemia   . Hypertension   . Myocardial infarction ([place])   . Peripheral vascular disease ([place])   . Polio    childhood  . Stroke [facility])    Social History   Socioeconomic History  . Marital status: Widowed    Spouse name: Not on file  . Number of children: Not on file  . Years of education: Not on file  . Highest education level: Not on file  Occupational History  . Occupation: disabled  Tobacco Use  . Smoking status: Never Smoker  . Smokeless tobacco: Never Used  Substance and Sexual Activity  . Alcohol use: No  . Drug use: Yes    Types: "Crack" cocaine    Comment: Quit using cocaine 5 years ago  . Sexual activity: Not Currently  Other Topics Concern  . Not on file  Social History Narrative   Bedbound at baseline, currently from a  nursing home ([facility])   Social Determinants of Health   Financial Resource Strain:   . Difficulty of Paying Living Expenses: Not on file  Food Insecurity:   . Worried About Charity fundraiser in the Last Year: Not on file  . Ran Out of Food in the Last Year: Not on file  Transportation Needs:   . Lack of Transportation (Medical): Not on file  . Lack of Transportation (Non-Medical): Not on file  Physical Activity:   . Days of Exercise per Week: Not on file  . Minutes of Exercise per Session: Not on file  Stress:   . Feeling of Stress : Not on file  Social Connections:   . Frequency of Communication with Friends and Family: Not on file  . Frequency of Social Gatherings with Friends and Family: Not on file  . Attends Religious Services: Not on file  . Active Member of Clubs or Organizations: Not on file  . Attends Archivist Meetings: Not on file  . Marital Status: Not on file   Family History  Problem Relation Age of Onset  . Cancer Mother   . Stroke Father   . Diabetes Neg Hx   . Hypertension Neg Hx    Scheduled Meds: .  chlorhexidine gluconate (MEDLINE KIT)  15 mL Mouth Rinse BID  . [START ON 09/10/2019] Chlorhexidine Gluconate Cloth  6 each Topical Q0600  . [START ON 09/10/2019] influenza vaccine adjuvanted  0.5 mL Intramuscular Tomorrow-1000  . insulin aspart  0-9 Units Subcutaneous Q4H  . mouth rinse  15 mL Mouth Rinse 10 times per day  . [START ON 09/10/2019] pneumococcal 23 valent vaccine  0.5 mL Intramuscular Tomorrow-1000  . sodium chloride flush  3 mL Intravenous Q12H   Continuous Infusions: .  prismasol BGK 4/2.5    .  prismasol BGK 4/2.5    . sodium chloride    . [START ON 09/10/2019] famotidine (PEPCID) IV    . lactated ringers    . norepinephrine (LEVOPHED) Adult infusion 15 mcg/min (09/21/2019 1330)  . piperacillin-tazobactam (ZOSYN)  IV    . prismasol BGK 4/2.5    . vancomycin     PRN Meds:.sodium chloride, acetaminophen, heparin, ondansetron (ZOFRAN) IV, sodium chloride flush Medications Prior to Admission:  Prior to Admission medications   Medication Sig Start Date End Date Taking? Authorizing Provider  oxyCODONE (OXY IR/ROXICODONE) 5 MG immediate release tablet Take 5 mg by mouth 3 (three) times daily as needed for severe pain. From [facility] 09/06/19  Yes [provider]  acetaminophen (TYLENOL) 500 MG tablet Take 500 mg by mouth every 4 (four) hours as needed for mild pain or fever.    [provider]  albuterol (VENTOLIN HFA) 108 (90 Base) MCG/ACT inhaler Inhale 2 puffs into the lungs every 4 (four) hours as needed for wheezing or shortness of breath.    [provider]  aspirin EC 81 MG tablet Take 1 tablet (81 mg total) by mouth daily. Patient taking differently: Take 81 mg by mouth daily at 6 PM.  08/14/19   End, Harrell Gave, MD  atorvastatin (LIPITOR) 10 MG tablet Take 20 mg by mouth daily at 6 PM.  07/17/18   [provider]  b complex vitamins tablet Take 1 tablet by mouth daily at 6 PM.    [provider]  benzocaine (ORAJEL MAXIMUM STRENGTH) 20  % GEL Use as directed 1 application in the mouth or throat 4 (four) times daily as needed (pain).  [provider]  bisacodyl (DULCOLAX) 5 MG EC tablet Take 1 tablet (5 mg total) by mouth daily as needed for moderate constipation. 09/05/19   Nolberto Hanlon, MD  Cholecalciferol (D3-1000) 1000 units tablet Take 1,000 Units by mouth daily at 6 PM.     [provider]  clopidogrel (PLAVIX) 75 MG tablet Take 75 mg by mouth daily at 6 PM.    [provider]  epoetin alfa (EPOGEN) 10000 UNIT/ML injection Inject 0.4 mLs (4,000 Units total) into the vein Every Tuesday,Thursday,and Saturday with dialysis. 09/05/19   Nolberto Hanlon, MD  ferrous sulfate 325 (65 FE) MG tablet Take 325 mg by mouth daily at 6 PM.    [provider]  gabapentin (NEURONTIN) 100 MG capsule Take 1 capsule (100 mg total) by mouth every evening. Patient taking differently: Take 100 mg by mouth daily. Daily at 1800 12/29/17   Vaughan Basta, MD  heparin 5000 UNIT/ML injection Inject 1 mL (5,000 Units total) into the skin every 8 (eight) hours. 09/05/19   Nolberto Hanlon, MD  lactulose (CHRONULAC) 10 GM/15ML solution Take 20 g by mouth at bedtime.     [provider]  lidocaine-prilocaine (EMLA) cream Apply 1 application topically Every Tuesday,Thursday,and Saturday with dialysis.     [provider]  linagliptin (TRADJENTA) 5 MG TABS tablet Take 5 mg by mouth daily.    [provider]  lip balm (BLISTEX) OINT Apply 1 application topically as needed for lip care. 09/05/19   Nolberto Hanlon, MD  lisinopril (PRINIVIL,ZESTRIL) 10 MG tablet Take 1 tablet (10 mg total) by mouth daily at 6 PM. 02/23/18   Demetrios Loll, MD  metoprolol succinate (TOPROL-XL) 25 MG 24 hr tablet Take 25 mg by mouth daily at 6 PM.     [provider]  Multiple Vitamin (MULTIVITAMIN) tablet Take 1 tablet by mouth at bedtime.     [provider]  nitroGLYCERIN (NITROSTAT) 0.4 MG SL tablet Place 0.4 mg  under the tongue every 5 (five) minutes as needed for chest pain.    [provider]  Nutritional Supplements (FEEDING SUPPLEMENT, NEPRO CARB STEADY,) LIQD Take 237 mLs by mouth 2 (two) times daily between meals. 09/05/19   Nolberto Hanlon, MD  oxyCODONE-acetaminophen (PERCOCET) 5-325 MG tablet Take 1 tablet by mouth every 6 (six) hours as needed for severe pain. 08/07/19 08/06/20  Schnier, Dolores Lory, MD  Polyethyl Glyc-Propyl Glyc PF (SYSTANE ULTRA PF) 0.4-0.3 % SOLN Place 1 drop into the left eye 4 (four) times daily.    [provider]  polyethylene glycol (MIRALAX / GLYCOLAX) 17 g packet Take 17 g by mouth daily as needed for moderate constipation or severe constipation. 09/05/19   Nolberto Hanlon, MD  Polyvinyl Alcohol-Povidone 5-6 MG/ML SOLN Place 1 drop into the left eye 4 (four) times daily.    [provider]  saccharomyces boulardii (FLORASTOR) 250 MG capsule Take 250 mg by mouth daily.    [provider]  sertraline (ZOLOFT) 100 MG tablet Take 100 mg by mouth at bedtime.     [provider]  sevelamer carbonate (RENVELA) 800 MG tablet Take 800 mg by mouth 2 (two) times daily. (with snacks)    [provider]  sevelamer carbonate (RENVELA) 800 MG tablet Take 1,600 mg by mouth 3 (three) times daily with meals.    [provider]  Skin Protectants, Misc. (EUCERIN) cream Apply 1 application topically 2 (two) times daily as needed (rash).     [provider]  vancomycin (VANCOREADY) 500 MG/100ML IVPB Inject 100 mLs (500 mg total) into the vein Every Tuesday,Thursday,and Saturday with dialysis. 09/07/19   Nolberto Hanlon, MD   Allergies  Allergen Reactions  . Cefuroxime Itching  . Shrimp [Shellfish Allergy] Other (See Comments)    Per mar. Unknown reaction   . Sulfa Antibiotics Itching and Rash   Review of Systems  Unable to perform ROS   Physical Exam Constitutional:      Comments: On ventilator.      Vital Signs: BP (!) 112/43    Pulse 86   Temp 100 F (37.8 C)   Resp 20   Wt 74.4 kg   SpO2 99%   BMI 22.88 kg/m  Pain Scale: PAINAD       SpO2: SpO2: 99 % O2 Device:SpO2: 99 % O2 Flow Rate: .   IO: Intake/output summary:   Intake/Output Summary (Last 24 hours) at 09/05/2019 1555 Last data filed at 09/26/2019 1500 Gross per 24 hour  Intake 340.98 ml  Output --  Net 340.98 ml    LBM:   Baseline Weight: Weight: 74.4 kg Most recent weight: Weight: 74.4 kg     Palliative Assessment/Data:     Time In: 3:00 Time Out: 3:50 Time Total: 50 min Greater than 50%  of this time was spent counseling and coordinating care related to the above assessment and plan.  Signed by: Asencion Gowda, NP   Please contact Palliative Medicine Team phone at [phone] for questions and concerns.  For individual provider: See Shea Evans

## 2019-09-09 NOTE — ED Notes (Signed)
Lab at bedside

## 2019-09-09 NOTE — ED Notes (Signed)
EMS added - pt missed dialysis Saturday (has missed 2 treatments) and was given 50 mg of D10 in route to ED.

## 2019-09-09 NOTE — ED Notes (Signed)
1 amp d50 given at this time

## 2019-09-09 NOTE — Progress Notes (Signed)
[facility], [place] 09/23/2019  Subjective:   LOS: 0 No intake/output data recorded.  Patient well-known to [place] from prior admission where he had left above-the-knee amputation. Comes in now critically ill and currently intubated and responding to painful stimuli. Patient has been having hypoglycemia. However he currently is maintained on pressors with norepinephrine.  Objective:  Vital signs in last 24 hours:  Temp:  [96.3 F (35.7 C)-98.2 F (36.8 C)] 98.2 F (36.8 C) (03/08 1230) Pulse Rate:  [76-100] 78 (03/08 1245) Resp:  [13-24] 20 (03/08 1245) BP: (68-136)/(17-55) 103/23 (03/08 1245) SpO2:  [82 %-100 %] 100 % (03/08 1245) FiO2 (%):  [40 %-50 %] 40 % (03/08 1012)  Weight change:  There were no vitals filed for this visit.  Intake/Output:   No intake or output data in the 24 hours ending 09/15/2019 1306   Physical Exam: General:  Critically ill-appearing  HEENT  endotracheal tube in place  Pulm/lungs  bilateral rhonchi, vent assisted  CVS/Heart  S1S2 no rubs  Abdomen:   Soft, nontender  Extremities:  left AKA, right BKA  Neurologic:  Not responding to painful stimuli  Skin:  No acute rashes  Access:  Right arm new AV graft, left arm old AV graft       Basic Metabolic Panel:  Recent Labs  Lab 09/03/19 1318 09/04/19 0500 09/04/19 0500 09/05/19 0540 09/05/19 0540 09/07/19 0655 09/05/2019 0749 09/28/2019 0924  NA  --  139  --  137  --  135 142 135  K  --  3.3*  --  3.3*  --  5.0 5.4* 5.5*  CL  --  97*  --  95*  --  91* 101 93*  CO2  --  26  --  26  --  27 12* 13*  GLUCOSE  --  128*  --  99  --  218* 119* 433*  BUN  --  29*  --  36*  --  34* 47* 55*  CREATININE  --  5.28*  --  6.77*  --  7.14* 8.46* 9.79*  CALCIUM  --  8.0*   < > 8.2*   < > 8.1* 7.0* 7.1*  PHOS 2.6  --   --   --   --   --   --   --    < > = values in this interval not displayed.     CBC: Recent Labs  Lab 09/04/19 0500 09/05/19 0540 09/07/19 0655  09/03/2019 0749 09/21/2019 0924  WBC 12.6* 13.6* 12.3* 12.6* 24.3*  NEUTROABS  --   --  8.7* 10.2*  --   HGB 8.9* 9.3* 8.9* 6.1* 9.1*  HCT 28.2* 29.1* 28.4* 22.3* 30.6*  MCV 85.5 83.9 86.9 99.6 94.2  PLT 280 299 292 178 323      Lab Results  Component Value Date   HEPBSAG Negative 12/26/2017   HEPBSAB Non Reactive 12/26/2017      Microbiology:  Recent Results (from the past 240 hour(s))  Culture, blood (single) w Reflex to ID Panel     Status: None   Collection Time: 08/30/19  6:12 PM   Specimen: BLOOD  Result Value Ref Range Status   Specimen Description BLOOD RAC  Final   Special Requests   Final    BOTTLES DRAWN AEROBIC AND ANAEROBIC Blood Culture adequate volume   Culture   Final    NO GROWTH 5 DAYS Performed at [facility], [address] [place], [place] [address]  Report Status 09/04/2019 FINAL  Final  Culture, blood (single) w Reflex to ID Panel     Status: None   Collection Time: 08/30/19 10:27 PM   Specimen: BLOOD  Result Value Ref Range Status   Specimen Description BLOOD RIGHT ANTECUBITAL  Final   Special Requests   Final    BOTTLES DRAWN AEROBIC AND ANAEROBIC Blood Culture adequate volume   Culture   Final    NO GROWTH 5 DAYS Performed at [facility], [address], [place], [place] [address]    Report Status 09/04/2019 FINAL  Final  SARS CORONAVIRUS 2 (TAT 6-24 HRS) Nasopharyngeal Nasopharyngeal Swab     Status: None   Collection Time: 09/04/19  8:37 PM   Specimen: Nasopharyngeal Swab  Result Value Ref Range Status   SARS Coronavirus 2 NEGATIVE NEGATIVE Final    Comment: (NOTE) SARS-CoV-2 target nucleic acids are NOT DETECTED. The SARS-CoV-2 RNA is generally detectable in upper and lower respiratory specimens during the acute phase of infection. Negative results do not preclude SARS-CoV-2 infection, do not rule out co-infections with other pathogens, and should not be used as the sole basis for treatment or other patient  management decisions. Negative results must be combined with clinical observations, patient history, and epidemiological information. The expected result is Negative. Fact Sheet for Patients: SugarRoll.be Fact Sheet for Healthcare Providers: https://www.woods-mathews.com/ This test is not yet approved or cleared by the [place] FDA and  has been authorized for detection and/or diagnosis of SARS-CoV-2 by FDA under an Emergency Use Authorization (EUA). This EUA will remain  in effect (meaning this test can be used) for the duration of the COVID-19 declaration under Section 56 4(b)(1) of the Act, 21 U.S.C. section 360bbb-3(b)(1), unless the authorization is terminated or revoked sooner. Performed at [facility], [place] [address] [place], [place] [address]     Coagulation Studies: Recent Labs    09/07/19 0840 09/23/2019 0749  LABPROT 24.5* 67.5*  INR 2.2* 8.0*    Urinalysis: Recent Labs    09/20/2019 0803  COLORURINE AMBER*  LABSPEC 1.020  PHURINE 5.0  GLUCOSEU 50*  HGBUR MODERATE*  BILIRUBINUR NEGATIVE  KETONESUR NEGATIVE  PROTEINUR >=300*  NITRITE NEGATIVE  LEUKOCYTESUR LARGE*      Imaging: DG Chest 1 View  Result Date: 09/27/2019 CLINICAL DATA:  Pt to ED via EMS from [facility]. Pt unresponsive upon staff assessment. Unknown last known well. Pt was seen recently for "not acting right" but was admitted and later discharged. Right arm is currently being used for dialysis. Post intubation EXAM: CHEST  1 VIEW COMPARISON:  09/07/2019 FINDINGS: Endotracheal to 3.7 cm from carina. NG to extends in the stomach enlarged cardiac silhouette. No pulmonary edema. No pneumothorax. Vascular stent in LEFT subclavian vein. IMPRESSION: 1. Intubation without complication.  NG tube in stomach. 2. Stable cardiomegaly Electronically Signed   By: Suzy Bouchard M.D.   On: 10/02/2019 07:36   DG Abd 1 View  Result Date: 09/14/2019 CLINICAL  DATA:  Status post OG tube placement. EXAM: ABDOMEN - 1 VIEW COMPARISON:  None. FINDINGS: OG tube tip and side port are in the stomach. IMPRESSION: As above. Electronically Signed   By: Inge Rise M.D.   On: 09/21/2019 12:29   CT Head Wo Contrast  Result Date: 09/11/2019 CLINICAL DATA:  Patient unresponsive. EXAM: CT HEAD WITHOUT CONTRAST TECHNIQUE: Contiguous axial images were obtained from the base of the skull through the vertex without intravenous contrast. COMPARISON:  12/24/2017 FINDINGS: Brain:  No evidence of acute infarction, hemorrhage, hydrocephalus, extra-axial collection or mass lesion/mass effect. Patchy areas of bilateral white matter hypoattenuation are noted, stable consistent mild chronic microvascular ischemic change. 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 Vascular: No hyperdense vessel or unexpected calcification. Skull: Normal. Negative for fracture or focal lesion. 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 No acute findings for the globes or orbits. Visualized sinuses are clear. Other: None. IMPRESSION: 1. No acute intracranial abnormalities. Stable appearance from the prior head CT. Electronically Signed   By: Lajean Manes M.D.   On: 09/15/2019 07:23   DG Chest Port 1 View  Result Date: 09/20/2019 CLINICAL DATA:  Status post central line and OG tube placement. EXAM: PORTABLE CHEST 1 VIEW COMPARISON:  Single-view of the chest earlier today. FINDINGS: The patient's endotracheal tube is in good position with the tip just below the clavicular heads, 5 cm above the carina. New left IJ approach central venous catheter is in place with the tip projecting in the lower superior vena cava. OG tube tip and side-port are in the stomach. No pneumothorax. Lungs clear. Heart size normal. Atherosclerosis. IMPRESSION: Support tubes and lines projecting good position.  No pneumothorax. Lungs clear. Electronically Signed   By: Inge Rise M.D.   On: 09/13/2019 12:28      Medications:   . sodium chloride    . [START ON 09/10/2019] famotidine (PEPCID) IV    . lactated ringers    . norepinephrine (LEVOPHED) Adult infusion 10 mcg/min (09/08/2019 0707)  . piperacillin-tazobactam (ZOSYN)  IV    . vancomycin     . sodium chloride flush  3 mL Intravenous Q12H   sodium chloride, acetaminophen, ondansetron (ZOFRAN) IV, sodium chloride flush  Assessment/ Plan:  69 y.o. male with end-stage renal disease on hemodialysis, hypertension, history of polio in childhood, hyperlipidemia, stroke with left-sided residual weakness, peripheral vascular disease-history of right BKA, new left AKA, coronary disease status post PCI to proximal LAD, type 2 diabetes, diabetic neuropathy, congestive heart failure, asthma  Active Problems:   Respiratory failure ([place])  [facility] TTS// [facility]// rt arm AVG  #. ESRD Patient critically ill at the moment.  Patient is not stable enough to undergo intermittent hemodialysis.  Therefore we will need to opt for CRRT.  Left IJ temporary dialysis catheter has been placed.  #. Anemia of CKD  Lab Results  Component Value Date   HGB 9.1 (L) 09/22/2019   Hemoglobin 9.1.  Consider starting the patient on weekly Epogen.  #. SHPTH  No results found for: PTH Lab Results  Component Value Date   PHOS 2.6 09/03/2019   Monitor bone mineral metabolism parameters during the course of the admission.  #. Diabetes type 2 with CKD with neuropathy, peripheral vascular disease Hgb A1c MFr Bld (%)  Date Value  08/29/2019 7.8 (H)  Glycemic control per CC.   #Peripheral vascular disease History of right BKA Gangrene of left foot, nonsalvageable Left AKA on August 29, 2018  #Acute respiratory failure. Patient not responding to painful stimuli.  Currently on the ventilator.  Overall prognosis quite guarded.      LOS: 0 Dennis Walker 3/8/20211:06 PM  [place], [place]
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
## 2019-09-09 NOTE — Procedures (Signed)
Central Venous Catheter Insertion Procedure Note YOBANI SCHERTZER [id number] 1950-09-05  Procedure: Insertion of Central Venous Catheter Indications: Assessment of intravascular volume, Drug and/or fluid administration and Frequent blood sampling  Procedure Details Consent: Risks of procedure as well as the alternatives and risks of each were explained to the (patient/caregiver).  Consent for procedure obtained. Time Out: Verified patient identification, verified procedure, site/side was marked, verified correct patient position, special equipment/implants available, medications/allergies/relevent history reviewed, required imaging and test results available.  Performed  Maximum sterile technique was used including antiseptics, cap, gloves, gown, hand hygiene, mask and sheet. Skin prep: Chlorhexidine; local anesthetic administered A antimicrobial bonded/coated triple lumen catheter was placed in the left femoral vein due to patient being a dialysis patient using the Seldinger technique.  Evaluation Blood flow good Complications: No apparent complications Patient did tolerate procedure well. Chest X-ray ordered to verify placement.  CXR: not indicated .  Left femoral central line placed utilizing ultrasound no complications noted during or following procedure.   Marda Stalker, [place] Pager [phone] (please enter 7 digits) PCCM Consult Pager [phone] (please enter 7 digits)

## 2019-09-09 NOTE — ED Triage Notes (Addendum)
Pt to ED via EMS from [facility]. Pt unresponsive upon staff assessment. Unknown last known well. Pt was seen recently for "not acting right" but was admitted and later discharged.   Right arm is currently being used for dialysis access.  Upper rightward gaze per EMS. Pt not responding to MD verbal or painful stimuli.  EMS unable to get oxygen saturation or BP. HR 40-50 bpm  MD able to talk to daughter who confirms pt is a full code.

## 2019-09-09 NOTE — H&P (Addendum)
Name: Dennis Walker MRN: [id number] DOB: Nov 16, 1950     CONSULTATION DATE: 09/30/2019  REFERRING MD : Jimmye Norman  CHIEF COMPLAINT: resp failure  HISTORY OF PRESENT ILLNESS:    69 y.o. male the history of ESRD on HD, diabetes, recent AKA of the left lower extremity, hypertension, hyperlipidemia, peripheral vascular disease on Plavix who presents from his nursing home for unresponsiveness.  According to EMS, patient's mental status has been declining over the last 48 hours -This morning when they checked on him he was unresponsive -When EMS arrived patient was found to have blood glucose in the 50s and they were unable to get a blood pressure or sats on him  -His heart rate was in the 30s.  Patient received 50 cc of D10 - An IO was placed in the field.  Patient arrives in the emergency room with a GCS of 3, pulse in the 40s, repeat blood glucose in the 60s, and BP in the 60s.  Patient had left and gaze deviation but no other signs of seizure.  According to EMS patient has missed his last 2 dialysis treatment.  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  has a past surgical history that includes A/V Fistulagram (Left, 01/31/2017); Leg amputation below knee (Right, 2003); Enucleation; Coronary angioplasty; A/V Fistulagram (N/A, 07/07/2017); A/V Fistulagram (Left, 02/07/2018); TEE without cardioversion (N/A, 02/20/2018); A/V Fistulagram (Left, 03/13/2018); Lower Extremity Angiography (Left, 12/04/2018); UPPER EXTREMITY  VENOGRAPHY (Right, 12/04/2018); A/V Fistulagram (N/A, 06/05/2019); AV fistula placement (Right, 08/07/2019); CENTRAL LINE INSERTION (Right, 08/07/2019); Amputation (Left, 08/31/2019); and Lower Extremity Angiography (Left, 08/30/2019). Prior to Admission medications   Medication Sig Start Date End Date Taking? Authorizing Provider  oxyCODONE (OXY IR/ROXICODONE) 5 MG immediate release tablet Take 5 mg by mouth 3 (three) times daily as needed for severe pain. From [facility] 09/06/19  Yes [provider]  acetaminophen (TYLENOL) 500 MG tablet Take 500 mg by mouth every 4 (four) hours as needed for mild pain or fever.    [provider]  albuterol (VENTOLIN HFA) 108 (90 Base) MCG/ACT inhaler Inhale 2 puffs into the lungs every 4 (four) hours as needed for wheezing or shortness of breath.    [provider]  aspirin EC 81 MG tablet Take 1 tablet (81 mg total) by mouth daily. Patient taking differently: Take 81 mg by mouth daily at 6 PM.  08/14/19   End, Harrell Gave, MD  atorvastatin (LIPITOR) 10 MG tablet Take 20 mg by mouth daily at 6 PM.  07/17/18   [provider]  b complex vitamins tablet Take 1 tablet by mouth daily at 6 PM.    [provider]  benzocaine (ORAJEL MAXIMUM STRENGTH) 20 % GEL Use as directed 1 application in the mouth or throat 4 (four) times daily as needed (pain).     [provider]  bisacodyl (DULCOLAX) 5 MG EC tablet Take 1 tablet (5 mg total) by mouth daily as needed for moderate constipation. 09/05/19   Nolberto Hanlon, MD  Cholecalciferol (D3-1000) 1000 units tablet Take 1,000 Units by mouth daily at 6 PM.  [provider]  clopidogrel (PLAVIX) 75 MG tablet Take 75 mg by mouth daily at 6 PM.    [provider]  epoetin alfa (EPOGEN) 10000 UNIT/ML injection Inject 0.4 mLs (4,000 Units total) into the vein Every Tuesday,Thursday,and Saturday with dialysis. 09/05/19   Nolberto Hanlon, MD  ferrous sulfate 325 (65 FE) MG tablet Take  325 mg by mouth daily at 6 PM.    [provider]  gabapentin (NEURONTIN) 100 MG capsule Take 1 capsule (100 mg total) by mouth every evening. Patient taking differently: Take 100 mg by mouth daily. Daily at 1800 12/29/17   Vaughan Basta, MD  heparin 5000 UNIT/ML injection Inject 1 mL (5,000 Units total) into the skin every 8 (eight) hours. 09/05/19   Nolberto Hanlon, MD  lactulose (CHRONULAC) 10 GM/15ML solution Take 20 g by mouth at bedtime.     [provider]  lidocaine-prilocaine (EMLA) cream Apply 1 application topically Every Tuesday,Thursday,and Saturday with dialysis.     [provider]  linagliptin (TRADJENTA) 5 MG TABS tablet Take 5 mg by mouth daily.    [provider]  lip balm (BLISTEX) OINT Apply 1 application topically as needed for lip care. 09/05/19   Nolberto Hanlon, MD  lisinopril (PRINIVIL,ZESTRIL) 10 MG tablet Take 1 tablet (10 mg total) by mouth daily at 6 PM. 02/23/18   Demetrios Loll, MD  metoprolol succinate (TOPROL-XL) 25 MG 24 hr tablet Take 25 mg by mouth daily at 6 PM.     [provider]  Multiple Vitamin (MULTIVITAMIN) tablet Take 1 tablet by mouth at bedtime.     [provider]  nitroGLYCERIN (NITROSTAT) 0.4 MG SL tablet Place 0.4 mg under the tongue every 5 (five) minutes as needed for chest pain.    [provider]  Nutritional Supplements (FEEDING SUPPLEMENT, NEPRO CARB STEADY,) LIQD Take 237 mLs by mouth 2 (two) times daily between meals. 09/05/19   Nolberto Hanlon, MD  oxyCODONE-acetaminophen (PERCOCET) 5-325 MG tablet Take 1 tablet by mouth every 6 (six) hours as needed for severe pain. 08/07/19 08/06/20  Schnier, Dolores Lory, MD  Polyethyl Glyc-Propyl Glyc PF (SYSTANE ULTRA PF) 0.4-0.3 % SOLN Place 1 drop into the left eye 4 (four) times daily.    [provider]  polyethylene glycol (MIRALAX / GLYCOLAX) 17 g packet Take 17 g by mouth daily as needed for moderate constipation or severe constipation. 09/05/19    Nolberto Hanlon, MD  Polyvinyl Alcohol-Povidone 5-6 MG/ML SOLN Place 1 drop into the left eye 4 (four) times daily.    [provider]  saccharomyces boulardii (FLORASTOR) 250 MG capsule Take 250 mg by mouth daily.    [provider]  sertraline (ZOLOFT) 100 MG tablet Take 100 mg by mouth at bedtime.     [provider]  sevelamer carbonate (RENVELA) 800 MG tablet Take 800 mg by mouth 2 (two) times daily. (with snacks)    [provider]  sevelamer carbonate (RENVELA) 800 MG tablet Take 1,600 mg by mouth 3 (three) times daily with meals.    [provider]  Skin Protectants, Misc. (EUCERIN) cream Apply 1 application topically 2 (two) times daily as needed (rash).     [provider]  vancomycin (VANCOREADY) 500 MG/100ML IVPB Inject 100 mLs (500 mg total) into the vein Every Tuesday,Thursday,and Saturday with dialysis. 09/07/19   Nolberto Hanlon, MD   Allergies  Allergen Reactions  . Cefuroxime Itching  . Shrimp [Shellfish Allergy] Other (See Comments)  Per mar. Unknown reaction   . Sulfa Antibiotics Itching and Rash    FAMILY HISTORY:  family history includes Cancer in his mother; Stroke in his father. SOCIAL HISTORY:  reports that he has never smoked. He has never used smokeless tobacco. He reports current drug use. Drug: "Crack" cocaine. He reports that he does not drink alcohol.  REVIEW OF SYSTEMS:   Unable to obtain due to critical illness   VITAL SIGNS: Temp:  [96.3 F (35.7 C)-97.2 F (36.2 C)] 97.2 F (36.2 C) (03/08 1025) Pulse Rate:  [78-100] 97 (03/08 1030) Resp:  [13-24] 20 (03/08 1030) BP: (68-136)/(17-55) 128/17 (03/08 1030) SpO2:  [82 %-100 %] 98 % (03/08 1030) FiO2 (%):  [40 %-50 %] 40 % (03/08 1012)    SpO2: 98 % FiO2 (%): 40 %   Physical Examination:  GENERAL:critically ill appearing, +resp distress HEAD: Normocephalic, atraumatic.  EYES: RT EYE blindness MOUTH: Moist mucosal membrane. NECK: Supple. No  JVD.  PULMONARY: +rhonchi, +wheezing CARDIOVASCULAR: S1 and S2. Regular rate and rhythm. No murmurs, rubs, or gallops.  GASTROINTESTINAL: Soft, nontender, -distended.  NEG bowel sounds.  MUSCULOSKELETAL: BKA b/l NEUROLOGIC: obtunded SKIN:intact,warm,dry  MEDICATIONS: I have reviewed all medications and confirmed regimen as documented   CULTURE RESULTS   Recent Results (from the past 240 hour(s))  Culture, blood (single) w Reflex to ID Panel     Status: None   Collection Time: 08/30/19  6:12 PM   Specimen: BLOOD  Result Value Ref Range Status   Specimen Description BLOOD RAC  Final   Special Requests   Final    BOTTLES DRAWN AEROBIC AND ANAEROBIC Blood Culture adequate volume   Culture   Final    NO GROWTH 5 DAYS Performed at [facility], [address], [place], [place] [address]    Report Status 09/04/2019 FINAL  Final  Culture, blood (single) w Reflex to ID Panel     Status: None   Collection Time: 08/30/19 10:27 PM   Specimen: BLOOD  Result Value Ref Range Status   Specimen Description BLOOD RIGHT ANTECUBITAL  Final   Special Requests   Final    BOTTLES DRAWN AEROBIC AND ANAEROBIC Blood Culture adequate volume   Culture   Final    NO GROWTH 5 DAYS Performed at [facility], [address], [place], [place] [address]    Report Status 09/04/2019 FINAL  Final  SARS CORONAVIRUS 2 (TAT 6-24 HRS) Nasopharyngeal Nasopharyngeal Swab     Status: None   Collection Time: 09/04/19  8:37 PM   Specimen: Nasopharyngeal Swab  Result Value Ref Range Status   SARS Coronavirus 2 NEGATIVE NEGATIVE Final    Comment: (NOTE) SARS-CoV-2 target nucleic acids are NOT DETECTED. The SARS-CoV-2 RNA is generally detectable in upper and lower respiratory specimens during the acute phase of infection. Negative results do not preclude SARS-CoV-2 infection, do not rule out co-infections with other pathogens, and should not be used as the sole basis for treatment or other  patient management decisions. Negative results must be combined with clinical observations, patient history, and epidemiological information. The expected result is Negative. Fact Sheet for Patients: SugarRoll.be Fact Sheet for Healthcare Providers: https://www.woods-mathews.com/ This test is not yet approved or cleared by the [place] FDA and  has been authorized for detection and/or diagnosis of SARS-CoV-2 by FDA under an Emergency Use Authorization (EUA). This EUA will remain  in effect (meaning this test can be used) for the duration of the COVID-19 declaration under Section  56 4(b)(1) of the Act, 21 U.S.C. section 360bbb-3(b)(1), unless the authorization is terminated or revoked sooner. Performed at [facility], [place] [address] [place], [place] [address]           IMAGING    DG Chest 1 View  Result Date: 09/18/2019 CLINICAL DATA:  Pt to ED via EMS from [facility]. Pt unresponsive upon staff assessment. Unknown last known well. Pt was seen recently for "not acting right" but was admitted and later discharged. Right arm is currently being used for dialysis. Post intubation EXAM: CHEST  1 VIEW COMPARISON:  09/07/2019 FINDINGS: Endotracheal to 3.7 cm from carina. NG to extends in the stomach enlarged cardiac silhouette. No pulmonary edema. No pneumothorax. Vascular stent in LEFT subclavian vein. IMPRESSION: 1. Intubation without complication.  NG tube in stomach. 2. Stable cardiomegaly Electronically Signed   By: Suzy Bouchard M.D.   On: 09/30/2019 07:36   CT Head Wo Contrast  Result Date: 09/21/2019 CLINICAL DATA:  Patient unresponsive. EXAM: CT HEAD WITHOUT CONTRAST TECHNIQUE: Contiguous axial images were obtained from the base of the skull through the vertex without intravenous contrast. COMPARISON:  12/24/2017 FINDINGS: Brain: No evidence of acute infarction, hemorrhage, hydrocephalus, extra-axial collection or mass  lesion/mass effect. Patchy areas of bilateral white matter hypoattenuation are noted, stable consistent mild chronic microvascular ischemic change. 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 Vascular: No hyperdense vessel or unexpected calcification. Skull: Normal. Negative for fracture or focal lesion. 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 No acute findings for the globes or orbits. Visualized sinuses are clear. Other: None. IMPRESSION: 1. No acute intracranial abnormalities. Stable appearance from the prior head CT. Electronically Signed   By: Lajean Manes M.D.   On: 09/14/2019 07:23     Ventilator continued, requirement due to severe respiratory failure   Ventilator Sedation RASS 0 to -2      ASSESSMENT AND PLAN SYNOPSIS  69 yo AAM with multiple medical issues with ESRD on HD with Poor protoplasm and poor vasculature with DIASTOLIC HEART FAILURE with acute and severe acidosis with severe toxic metabolic encephalopathy with multiorgan failure in the ACTIVE DYING PROCESS    Severe ACUTE Hypoxic and Hypercapnic Respiratory Failure from severe acidosis and active dying process -continue Full MV support -continue Bronchodilator Therapy -Wean Fio2 and PEEP as tolerated   ACUTE DIASTOLIC CARDIAC FAILURE- Supportive care     KIDNEY INJURY/Renal Failure -follow chem 7 -follow UO -continue Foley Catheter-assess need -Avoid nephrotoxic agents Will likley need CRRT   NEUROLOGY GCS<3   SHOCK-SEPSIS/HYPOVOLUMIC/CARDIOGENIC -use vasopressors to keep MAP>65 -follow ABG and LA -follow up cultures -emperic ABX -consider stress dose steroids   CARDIAC ICU monitoring  ID -continue IV abx as prescibed -follow up cultures  GI GI PROPHYLAXIS as indicated  NUTRITIONAL STATUS DIET-->NPO Constipation protocol as indicated   ENDO - will use ICU hypoglycemic\Hyperglycemia protocol if needed    ELECTROLYTES -follow labs as needed -replace as  needed -pharmacy consultation and following   DVT/GI PRX ordered TRANSFUSIONS AS NEEDED MONITOR FSBS ASSESS the need for LABS    Critical Care Time devoted to patient care services described in this note is 45 minutes.   Overall, patient is critically ill, prognosis is guarded.  Patient with Multiorgan failure and at high risk for cardiac arrest and death.    Corrin Parker, M.D.  [facility]  Medical Director [place] Director [facility] Cardio-Pulmonary Department

## 2019-09-09 NOTE — Progress Notes (Signed)
Hemodialysis patient known at [place] 6:00am. Please contact me with any dialysis placement concerns.

## 2019-09-09 NOTE — ED Notes (Signed)
Lab only able to get one set of blood cultures. Per MD Jimmye Norman verbal to start antibiotics.

## 2019-09-09 NOTE — ED Notes (Signed)
Lab called to stick for labs

## 2019-09-09 NOTE — ED Notes (Signed)
Pt transported to CT with MD at bedside.

## 2019-09-10 ENCOUNTER — Inpatient Hospital Stay (HOSPITAL_COMMUNITY)
Admit: 2019-09-10 | Discharge: 2019-09-10 | Disposition: A | Discharge status: Home / Self Care | Payer: Medicare Other | Attending: Internal Medicine | Admitting: Internal Medicine

## 2019-09-10 ENCOUNTER — Inpatient Hospital Stay: Payer: Medicare Other

## 2019-09-10 DIAGNOSIS — I361 Nonrheumatic tricuspid (valve) insufficiency: Secondary | ICD-10-CM

## 2019-09-10 DIAGNOSIS — N186 End stage renal disease: Secondary | ICD-10-CM

## 2019-09-10 DIAGNOSIS — Z515 Encounter for palliative care: Secondary | ICD-10-CM

## 2019-09-10 DIAGNOSIS — R778 Other specified abnormalities of plasma proteins: Secondary | ICD-10-CM

## 2019-09-10 DIAGNOSIS — R7881 Bacteremia: Secondary | ICD-10-CM

## 2019-09-10 DIAGNOSIS — A419 Sepsis, unspecified organism: Principal | ICD-10-CM

## 2019-09-10 DIAGNOSIS — L8990 Pressure ulcer of unspecified site, unspecified stage: Secondary | ICD-10-CM | POA: Insufficient documentation

## 2019-09-10 DIAGNOSIS — Z7189 Other specified counseling: Secondary | ICD-10-CM

## 2019-09-10 DIAGNOSIS — R6521 Severe sepsis with septic shock: Secondary | ICD-10-CM

## 2019-09-10 LAB — RENAL FUNCTION PANEL
Albumin: 2.4 g/dL — ABNORMAL LOW (ref 3.5–5.0)
Albumin: 2.5 g/dL — ABNORMAL LOW (ref 3.5–5.0)
Anion gap: 17 — ABNORMAL HIGH (ref 5–15)
Anion gap: 20 — ABNORMAL HIGH (ref 5–15)
BUN: 51 mg/dL — ABNORMAL HIGH (ref 8–23)
BUN: 45 mg/dL — ABNORMAL HIGH (ref 8–23)
CO2: 20 mmol/L — ABNORMAL LOW (ref 22–32)
CO2: 20 mmol/L — ABNORMAL LOW (ref 22–32)
Calcium: 6.9 mg/dL — ABNORMAL LOW (ref 8.9–10.3)
Calcium: 7.4 mg/dL — ABNORMAL LOW (ref 8.9–10.3)
Chloride: 97 mmol/L — ABNORMAL LOW (ref 98–111)
Chloride: 97 mmol/L — ABNORMAL LOW (ref 98–111)
Creatinine, Ser: 7.99 mg/dL — ABNORMAL HIGH (ref 0.61–1.24)
Creatinine, Ser: 6.79 mg/dL — ABNORMAL HIGH (ref 0.61–1.24)
GFR calc Af Amer: 7 mL/min — ABNORMAL LOW (ref >60)
GFR calc Af Amer: 9 mL/min — ABNORMAL LOW (ref >60)
GFR calc non Af Amer: 6 mL/min — ABNORMAL LOW (ref >60)
GFR calc non Af Amer: 8 mL/min — ABNORMAL LOW (ref >60)
Glucose, Bld: 371 mg/dL — ABNORMAL HIGH (ref 70–99)
Glucose, Bld: 267 mg/dL — ABNORMAL HIGH (ref 70–99)
Phosphorus: 3.9 mg/dL (ref 2.5–4.6)
Phosphorus: 4.7 mg/dL — ABNORMAL HIGH (ref 2.5–4.6)
Potassium: 4.5 mmol/L (ref 3.5–5.1)
Potassium: 6 mmol/L — ABNORMAL HIGH (ref 3.5–5.1)
Sodium: 134 mmol/L — ABNORMAL LOW (ref 135–145)
Sodium: 137 mmol/L (ref 135–145)

## 2019-09-10 LAB — URINE CULTURE: Culture: NO GROWTH

## 2019-09-10 LAB — TYPE AND SCREEN
ABO/RH(D): O POS
Antibody Screen: NEGATIVE
Unit division: 0

## 2019-09-10 LAB — GLUCOSE, CAPILLARY
Glucose-Capillary: 13 mg/dL — CL (ref 70–99)
Glucose-Capillary: 236 mg/dL — ABNORMAL HIGH (ref 70–99)
Glucose-Capillary: 245 mg/dL — ABNORMAL HIGH (ref 70–99)
Glucose-Capillary: 248 mg/dL — ABNORMAL HIGH (ref 70–99)
Glucose-Capillary: 256 mg/dL — ABNORMAL HIGH (ref 70–99)
Glucose-Capillary: 256 mg/dL — ABNORMAL HIGH (ref 70–99)
Glucose-Capillary: 272 mg/dL — ABNORMAL HIGH (ref 70–99)
Glucose-Capillary: 289 mg/dL — ABNORMAL HIGH (ref 70–99)
Glucose-Capillary: 323 mg/dL — ABNORMAL HIGH (ref 70–99)
Glucose-Capillary: 76 mg/dL (ref 70–99)
Glucose-Capillary: 80 mg/dL (ref 70–99)
Glucose-Capillary: 99 mg/dL (ref 70–99)
Glucose-Capillary: <10 mg/dL — CL (ref 70–99)
Glucose-Capillary: <10 mg/dL — CL (ref 70–99)

## 2019-09-10 LAB — BLOOD GAS, ARTERIAL
Acid-base deficit: 4.4 mmol/L — ABNORMAL HIGH (ref 0.0–2.0)
Bicarbonate: 18.8 mmol/L — ABNORMAL LOW (ref 20.0–28.0)
FIO2: 0.4
MECHVT: 450 mL
O2 Saturation: 99.6 %
PEEP: 5 cmH2O
Patient temperature: 37
RATE: 20 resp/min
pCO2 arterial: 29 mmHg — ABNORMAL LOW (ref 32.0–48.0)
pH, Arterial: 7.42 (ref 7.350–7.450)
pO2, Arterial: 170 mmHg — ABNORMAL HIGH (ref 83.0–108.0)

## 2019-09-10 LAB — CBC
HCT: 27.8 % — ABNORMAL LOW (ref 39.0–52.0)
Hemoglobin: 8.8 g/dL — ABNORMAL LOW (ref 13.0–17.0)
MCH: 27.5 pg (ref 26.0–34.0)
MCHC: 31.7 g/dL (ref 30.0–36.0)
MCV: 86.9 fL (ref 80.0–100.0)
Platelets: 240 10*3/uL (ref 150–400)
RBC: 3.2 MIL/uL — ABNORMAL LOW (ref 4.22–5.81)
RDW: 22.3 % — ABNORMAL HIGH (ref 11.5–15.5)
WBC: 19.9 10*3/uL — ABNORMAL HIGH (ref 4.0–10.5)
nRBC: 3.9 % — ABNORMAL HIGH (ref 0.0–0.2)

## 2019-09-10 LAB — TROPONIN I (HIGH SENSITIVITY): Troponin I (High Sensitivity): 3810 ng/L (ref <18)

## 2019-09-10 LAB — BPAM RBC
Blood Product Expiration Date: 202104042359
Unit Type and Rh: 5100

## 2019-09-10 LAB — PROTIME-INR
INR: 5.7 (ref 0.8–1.2)
INR: 4 — ABNORMAL HIGH (ref 0.8–1.2)
Prothrombin Time: 51.6 seconds — ABNORMAL HIGH (ref 11.4–15.2)
Prothrombin Time: 38.9 seconds — ABNORMAL HIGH (ref 11.4–15.2)

## 2019-09-10 LAB — APTT: aPTT: 75 seconds — ABNORMAL HIGH (ref 24–36)

## 2019-09-10 LAB — PREPARE RBC (CROSSMATCH)

## 2019-09-10 LAB — MAGNESIUM: Magnesium: 2.7 mg/dL — ABNORMAL HIGH (ref 1.7–2.4)

## 2019-09-10 MED ORDER — POLYVINYL ALCOHOL 1.4 % OP SOLN
1.0000 [drp] | OPHTHALMIC | Status: DC | PRN
  Filled 2019-09-10: qty 15

## 2019-09-10 MED ORDER — DEXTROSE 50 % IV SOLN
INTRAVENOUS | Status: AC
  Administered 2019-09-10: 50 mL
  Filled 2019-09-10: qty 50

## 2019-09-10 MED ORDER — INSULIN ASPART 100 UNIT/ML ~~LOC~~ SOLN
0.0000 [IU] | SUBCUTANEOUS | Status: DC
  Administered 2019-09-11: 2 [IU] via SUBCUTANEOUS
  Administered 2019-09-11: 1 [IU] via SUBCUTANEOUS
  Administered 2019-09-11: 2 [IU] via SUBCUTANEOUS
  Administered 2019-09-11: 1 [IU] via SUBCUTANEOUS
  Filled 2019-09-10 (×4): qty 1

## 2019-09-10 MED ORDER — HYDROCORTISONE NA SUCCINATE PF 100 MG IJ SOLR
50.0000 mg | Freq: Four times a day (QID) | INTRAMUSCULAR | Status: DC
  Administered 2019-09-10 – 2019-09-12 (×9): 50 mg via INTRAVENOUS
  Filled 2019-09-10 (×9): qty 2

## 2019-09-10 MED ORDER — ARTIFICIAL TEARS OPHTHALMIC OINT
TOPICAL_OINTMENT | Freq: Three times a day (TID) | OPHTHALMIC | Status: DC
  Administered 2019-09-10 – 2019-09-11 (×3): 1 via OPHTHALMIC
  Filled 2019-09-10: qty 3.5

## 2019-09-10 MED ORDER — ARTIFICIAL TEARS OPHTHALMIC OINT
TOPICAL_OINTMENT | OPHTHALMIC | Status: DC | PRN
  Administered 2019-09-10: 1 via OPHTHALMIC
  Filled 2019-09-10: qty 3.5

## 2019-09-10 MED ORDER — VITAMIN K1 10 MG/ML IJ SOLN
10.0000 mg | Freq: Once | INTRAVENOUS | Status: AC
  Administered 2019-09-10: 10 mg via INTRAVENOUS
  Filled 2019-09-10: qty 1

## 2019-09-10 MED ORDER — EPOETIN ALFA 40000 UNIT/ML IJ SOLN
20000.0000 [IU] | INTRAMUSCULAR | Status: DC
  Administered 2019-09-10: 20000 [IU] via SUBCUTANEOUS
  Filled 2019-09-10: qty 1

## 2019-09-10 MED ORDER — DEXTROSE 10 % IV SOLN: INTRAVENOUS | Status: DC

## 2019-09-10 MED ORDER — MIDAZOLAM HCL 2 MG/2ML IJ SOLN: 1.0000 mg | INTRAMUSCULAR | Status: DC | PRN

## 2019-09-10 NOTE — Progress Notes (Signed)
CBG 323 from central line. MD made aware. Verbal order to stop D 10 fluids and start CBG checks q2h.

## 2019-09-10 NOTE — Progress Notes (Signed)
[facility], [place] 09/10/19  Subjective:   LOS: 1 03/08 0701 - 03/09 0700 In: 2129.5 [I.V.:1547.8; IV Piggyback:581.7] Out: 1526 [Emesis/NG output:100]  Pt seen at bedside.  Remains critically ill.   Still on CRRT. Pt is moving upper extremeties now.  Still on the ventilator. Requiring pressors.    Objective:  Vital signs in last 24 hours:  Temp:  [98.6 F (37 C)-100 F (37.8 C)] 98.6 F (37 C) (03/09 1130) Pulse Rate:  [46-139] 65 (03/09 1130) Resp:  [16-23] 16 (03/09 1130) BP: (38-194)/(23-98) 150/47 (03/09 1130) SpO2:  [97 %-100 %] 99 % (03/09 1130) FiO2 (%):  [30 %-40 %] 30 % (03/09 0805) Weight:  [72.6 kg] 72.6 kg (03/09 0436)  Weight change:  Filed Weights   09/06/2019 1300 09/10/19 0436  Weight: 74.4 kg 72.6 kg    Intake/Output:    Intake/Output Summary (Last 24 hours) at 09/10/2019 1300 Last data filed at 09/10/2019 1200 Gross per 24 hour  Intake 2532.98 ml  Output 1968 ml  Net 564.98 ml     Physical Exam: General:  Critically ill-appearing  HEENT  endotracheal tube in place  Pulm/lungs  bilateral rhonchi, vent assisted  CVS/Heart  S1S2 no rubs  Abdomen:   Soft, nontender  Extremities:  left AKA, right BKA  Neurologic:  Not responding to painful stimuli  Skin:  No acute rashes  Access:  Right arm new AV graft, left arm old AV graft       Basic Metabolic Panel:  Recent Labs  Lab 09/03/19 1318 09/04/19 0500 09/08/2019 0749 09/20/2019 0749 10/01/2019 0924 09/29/2019 0924 09/17/2019 1534 09/22/2019 2057 09/10/19 0223 09/10/19 0437  NA  --    < > 142  --  135  --  136 137  --  134*  K  --    < > 5.4*  --  5.5*  --  5.5* 4.6  --  4.5  CL  --    < > 101  --  93*  --  93* 93*  --  97*  CO2  --    < > 12*  --  13*  --  24 23  --  20*  GLUCOSE  --    < > 119*  --  433*  --  413* 288*  --  371*  BUN  --    < > 47*  --  55*  --  59* 58*  --  51*  CREATININE  --    < > 8.46*  --  9.79*  --  10.02* 9.19*  --  7.99*  CALCIUM  --     < > 7.0*   < > 7.1*   < > 7.4* 7.4*  --  6.9*  MG  --   --   --   --   --   --   --  2.8* 2.7*  --   PHOS 2.6  --   --   --   --   --  7.0* 4.3  --  3.9   < > = values in this interval not displayed.     CBC: Recent Labs  Lab 09/05/19 0540 09/07/19 0655 10/02/2019 0749 09/08/2019 0924 09/10/19 0223  WBC 13.6* 12.3* 12.6* 24.3* 19.9*  NEUTROABS  --  8.7* 10.2*  --   --   HGB 9.3* 8.9* 6.1* 9.1* 8.8*  HCT 29.1* 28.4* 22.3* 30.6* 27.8*  MCV 83.9 86.9 99.6 94.2 86.9  PLT 299 292 178  323 240      Lab Results  Component Value Date   HEPBSAG Negative 12/26/2017   HEPBSAB Non Reactive 12/26/2017      Microbiology:  Recent Results (from the past 240 hour(s))  SARS CORONAVIRUS 2 (TAT 6-24 HRS) Nasopharyngeal Nasopharyngeal Swab     Status: None   Collection Time: 09/04/19  8:37 PM   Specimen: Nasopharyngeal Swab  Result Value Ref Range Status   SARS Coronavirus 2 NEGATIVE NEGATIVE Final    Comment: (NOTE) SARS-CoV-2 target nucleic acids are NOT DETECTED. The SARS-CoV-2 RNA is generally detectable in upper and lower respiratory specimens during the acute phase of infection. Negative results do not preclude SARS-CoV-2 infection, do not rule out co-infections with other pathogens, and should not be used as the sole basis for treatment or other patient management decisions. Negative results must be combined with clinical observations, patient history, and epidemiological information. The expected result is Negative. Fact Sheet for Patients: SugarRoll.be Fact Sheet for Healthcare Providers: https://www.woods-mathews.com/ This test is not yet approved or cleared by the [place] FDA and  has been authorized for detection and/or diagnosis of SARS-CoV-2 by FDA under an Emergency Use Authorization (EUA). This EUA will remain  in effect (meaning this test can be used) for the duration of the COVID-19 declaration under Section 56 4(b)(1) of  the Act, 21 U.S.C. section 360bbb-3(b)(1), unless the authorization is terminated or revoked sooner. Performed at [facility], [place] [address], [place], [place] [address]   Blood culture (routine x 2)     Status: None (Preliminary result)   Collection Time: 09/03/2019  7:50 AM   Specimen: BLOOD  Result Value Ref Range Status   Specimen Description BLOOD RIGHT HAND  Final   Special Requests   Final    BOTTLES DRAWN AEROBIC AND ANAEROBIC Blood Culture results may not be optimal due to an inadequate volume of blood received in culture bottles   Culture   Final    NO GROWTH < 24 HOURS Performed at [facility], [address] [place], [place] [address]    Report Status PENDING  Incomplete  Urine Culture     Status: None   Collection Time: 09/13/2019  8:03 AM   Specimen: Urine, Catheterized  Result Value Ref Range Status   Specimen Description   Final    URINE, CATHETERIZED Performed at [facility], [address], [place], [place] [address]    Special Requests   Final    Immunocompromised Performed at [facility], [address], [place], [place] [address]    Culture   Final    NO GROWTH Performed at [facility], [place] [address] [place], [place] [address]    Report Status 09/10/2019 FINAL  Final  Blood culture (routine x 2)     Status: None (Preliminary result)   Collection Time: 09/19/2019 11:46 AM   Specimen: BLOOD  Result Value Ref Range Status   Specimen Description BLOOD BLOOD RIGHT HAND  Final   Special Requests   Final    BOTTLES DRAWN AEROBIC AND ANAEROBIC Blood Culture adequate volume   Culture   Final    NO GROWTH < 24 HOURS Performed at [facility], [address], [place],  [address]    Report Status PENDING  Incomplete  MRSA PCR Screening     Status: None   Collection Time: 09/02/2019  1:19 PM   Specimen: Nasopharyngeal  Result Value Ref Range Status   MRSA  by PCR NEGATIVE NEGATIVE Final    Comment:         The GeneXpert MRSA Assay (FDA approved for NASAL specimens only), is one component of a comprehensive MRSA colonization surveillance program. It is not intended to diagnose MRSA infection nor to guide or monitor treatment for MRSA infections. Performed at [facility], [address] [place],  [address]     Coagulation Studies: Recent Labs    10/02/2019 9381 09/10/19 0437 09/10/19 1005  LABPROT 67.5* 51.6* 38.9*  INR 8.0* 5.7* 4.0*    Urinalysis: Recent Labs    09/16/2019 0803  COLORURINE AMBER*  LABSPEC 1.020  PHURINE 5.0  GLUCOSEU 50*  HGBUR MODERATE*  BILIRUBINUR NEGATIVE  KETONESUR NEGATIVE  PROTEINUR >=300*  NITRITE NEGATIVE  LEUKOCYTESUR LARGE*      Imaging: DG Chest 1 View  Result Date: 09/18/2019 CLINICAL DATA:  Pt to ED via EMS from [facility]. Pt unresponsive upon staff assessment. Unknown last known well. Pt was seen recently for "not acting right" but was admitted and later discharged. Right arm is currently being used for dialysis. Post intubation EXAM: CHEST  1 VIEW COMPARISON:  09/07/2019 FINDINGS: Endotracheal to 3.7 cm from carina. NG to extends in the stomach enlarged cardiac silhouette. No pulmonary edema. No pneumothorax. Vascular stent in LEFT subclavian vein. IMPRESSION: 1. Intubation without complication.  NG tube in stomach. 2. Stable cardiomegaly Electronically Signed   By: Suzy Bouchard M.D.   On: 09/30/2019 07:36   DG Abd 1 View  Result Date: 09/03/2019 CLINICAL DATA:  Status post OG tube placement. EXAM: ABDOMEN - 1 VIEW COMPARISON:  None. FINDINGS: OG tube tip and side port are in the stomach. IMPRESSION: As above. Electronically Signed   By: Inge Rise M.D.   On: 09/27/2019 12:29   CT Head Wo Contrast  Result Date: 09/02/2019 CLINICAL DATA:  Patient unresponsive. EXAM: CT HEAD WITHOUT CONTRAST TECHNIQUE: Contiguous axial images were obtained from the base of the skull through the vertex without intravenous  contrast. COMPARISON:  12/24/2017 FINDINGS: Brain: No evidence of acute infarction, hemorrhage, hydrocephalus, extra-axial collection or mass lesion/mass effect. Patchy areas of bilateral white matter hypoattenuation are noted, stable consistent mild chronic microvascular ischemic change. 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 Vascular: No hyperdense vessel or unexpected calcification. Skull: Normal. Negative for fracture or focal lesion. 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 No acute findings for the globes or orbits. Visualized sinuses are clear. Other: None. IMPRESSION: 1. No acute intracranial abnormalities. Stable appearance from the prior head CT. Electronically Signed   By: Lajean Manes M.D.   On: 09/07/2019 07:23   DG Chest Port 1 View  Result Date: 09/10/2019 CLINICAL DATA:  Interval change EXAM: PORTABLE CHEST 1 VIEW COMPARISON:  Yesterday FINDINGS: Endotracheal tube tip is at the clavicular heads. The enteric tube and side-port reaches the stomach. Cardiomegaly. Hazy opacity of the lower chest without definite change given differences in projection. No visible pneumothorax. Left IJ line with tip at the upper cavoatrial junction. Left subclavian stenting. IMPRESSION: 1. Hazy opacity at the bases, usually atelectasis and pleural fluid. 2. Stable hardware positioning. Electronically Signed   By: Monte Fantasia M.D.   On: 09/10/2019 05:22   DG Chest Port 1 View  Result Date: 09/17/2019 CLINICAL DATA:  Status post central line and OG tube placement. EXAM: PORTABLE CHEST 1 VIEW COMPARISON:  Single-view of the chest earlier today. FINDINGS: The patient's endotracheal tube is in good position with the tip  just below the clavicular heads, 5 cm above the carina. New left IJ approach central venous catheter is in place with the tip projecting in the lower superior vena cava. OG tube tip and side-port are in the stomach. No pneumothorax. Lungs clear. Heart size normal.  Atherosclerosis. IMPRESSION: Support tubes and lines projecting good position.  No pneumothorax. Lungs clear. Electronically Signed   By: Inge Rise M.D.   On: 09/23/2019 12:28     Medications:   .  prismasol BGK 4/2.5 200 mL/hr at 09/24/2019 1606  .  prismasol BGK 4/2.5 200 mL/hr at 09/24/2019 1606  . sodium chloride    . fentaNYL infusion INTRAVENOUS Stopped (09/10/19 1159)  . phenylephrine (NEO-SYNEPHRINE) Adult infusion Stopped (09/10/19 1159)  . piperacillin-tazobactam (ZOSYN)  IV Stopped (09/10/19 0841)  . prismasol BGK 4/2.5 500 mL/hr at 09/10/19 0222  . vancomycin Stopped (09/05/2019 1831)  . vasopressin (PITRESSIN) infusion - *FOR SHOCK* Stopped (09/10/19 1159)   . chlorhexidine gluconate (MEDLINE KIT)  15 mL Mouth Rinse BID  . Chlorhexidine Gluconate Cloth  6 each Topical Q0600  . hydrocortisone sod succinate (SOLU-CORTEF) inj  50 mg Intravenous Q6H  . influenza vaccine adjuvanted  0.5 mL Intramuscular Tomorrow-1000  . mouth rinse  15 mL Mouth Rinse 10 times per day  . pantoprazole (PROTONIX) IV  40 mg Intravenous QHS  . pneumococcal 23 valent vaccine  0.5 mL Intramuscular Tomorrow-1000  . sodium chloride flush  3 mL Intravenous Q12H   sodium chloride, acetaminophen, artificial tears, fentaNYL (SUBLIMAZE) injection, heparin, midazolam, ondansetron (ZOFRAN) IV, polyvinyl alcohol, sodium chloride flush  Assessment/ Plan:  69 y.o. male with end-stage renal disease on hemodialysis, hypertension, history of polio in childhood, hyperlipidemia, stroke with left-sided residual weakness, peripheral vascular disease-history of right BKA, new left AKA, coronary disease status post PCI to proximal LAD, type 2 diabetes, diabetic neuropathy, congestive heart failure, asthma  Active Problems:   Respiratory failure ([place])  [facility] TTS// [facility]// rt arm AVG  #. ESRD Patient remains on pressors and is intubated.  Continue CRRT with goal of being net even at the moment.  Overall guarded  prognosis.   #. Anemia of CKD  Lab Results  Component Value Date   HGB 8.8 (L) 09/10/2019   Hgb currently 8.8, start on epogen 20000 units IV weekly.    #. SHPTH  No results found for: PTH Lab Results  Component Value Date   PHOS 3.9 09/10/2019   Monitor bone mineral metabolism parameters during the course of the admission.  #. Diabetes type 2 with CKD with neuropathy, peripheral vascular disease Hgb A1c MFr Bld (%)  Date Value  08/29/2019 7.8 (H)  Glycemic control per CC.   #Peripheral vascular disease History of right BKA Gangrene of left foot, nonsalvageable Left AKA on August 29, 2018  #Acute respiratory failure. Continue currently on the ventilator, continue mechanical ventilation.       LOS: 1 Charisma Charlot 3/9/20211:00 PM  [place], [place]

## 2019-09-10 NOTE — Progress Notes (Addendum)
Daily Progress Note   Patient Name: Dennis Walker       Date: 09/10/2019 DOB: [birth year]  Age: 69 y.o. MRN#: [id number] Attending Physician: Flora Lipps, MD Primary Care Physician: Juluis Pitch, MD Admit Date: 09/02/2019  Reason for Consultation/Follow-up: Establishing goals of care  Subjective: Patient is resting in bed on ventilator and CRRT. Vasopressin and phenylephrine in place. Left eye is open. Artificial tears/ointment ordered. No family at bedside.  Spoke with daughter and discussed updates; she states she has been kept updated. She states unless it is vital, she would like to forgo a TEE until he is more stable. She would like E link visit with her father. Staff aware.   Length of Stay: 1  Current Medications: Scheduled Meds:   chlorhexidine gluconate (MEDLINE KIT)  15 mL Mouth Rinse BID   Chlorhexidine Gluconate Cloth  6 each Topical Q0600   hydrocortisone sod succinate (SOLU-CORTEF) inj  50 mg Intravenous Q6H   influenza vaccine adjuvanted  0.5 mL Intramuscular Tomorrow-1000   mouth rinse  15 mL Mouth Rinse 10 times per day   pantoprazole (PROTONIX) IV  40 mg Intravenous QHS   pneumococcal 23 valent vaccine  0.5 mL Intramuscular Tomorrow-1000   sodium chloride flush  3 mL Intravenous Q12H    Continuous Infusions:   prismasol BGK 4/2.5 200 mL/hr at 09/03/2019 1606    prismasol BGK 4/2.5 200 mL/hr at 09/11/2019 1606   sodium chloride     fentaNYL infusion INTRAVENOUS 200 mcg/hr (09/10/19 1100)   phenylephrine (NEO-SYNEPHRINE) Adult infusion 250 mcg/min (09/10/19 1100)   piperacillin-tazobactam (ZOSYN)  IV Stopped (09/10/19 0841)   prismasol BGK 4/2.5 500 mL/hr at 09/10/19 0222   vancomycin Stopped (09/16/2019 1831)   vasopressin (PITRESSIN) infusion - *FOR  SHOCK* 0.03 Units/min (09/10/19 1100)    PRN Meds: sodium chloride, acetaminophen, fentaNYL (SUBLIMAZE) injection, heparin, midazolam, ondansetron (ZOFRAN) IV, polyvinyl alcohol, sodium chloride flush  Physical Exam Constitutional:      Comments: On vent  Neurological:     Comments: Left eye open.              Vital Signs: BP (!) 150/63    Pulse 66    Temp 98.6 F (37 C)    Resp 20    Wt 72.6 kg    SpO2 99%  BMI 22.32 kg/m  SpO2: SpO2: 99 % O2 Device: O2 Device: Ventilator O2 Flow Rate:    Intake/output summary:   Intake/Output Summary (Last 24 hours) at 09/10/2019 1102 Last data filed at 09/10/2019 1100 Gross per 24 hour  Intake 2465.17 ml  Output 1836 ml  Net 629.17 ml   LBM: Last BM Date: 09/10/19 Baseline Weight: Weight: 74.4 kg Most recent weight: Weight: 72.6 kg       Palliative Assessment/Data:      Patient Active Problem List   Diagnosis Date Noted   Respiratory failure ([place]) 09/06/2019   Gangrene of left foot ([place])    Sepsis ([place])    Advanced care planning/counseling discussion    Goals of care, counseling/discussion    Palliative care by specialist    Severe sepsis ([place]) 08/29/2019   Gangrenous toe ([place]) 08/28/2019   Hypotension 08/28/2019   PVD (peripheral vascular disease) ([place]) 08/28/2019   Hx of BKA, right ([place]) 08/28/2019   Anemia in CKD (chronic kidney disease) 06/26/2019   Blindness, 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   Dialysis AV fistula malfunction ([place]) 06/26/2019   Nephrotic range proteinuria 06/26/2019   Encounter for orthopedic follow-up care 05/20/2019   Atherosclerosis of native arteries of the extremities with ulceration ([place]) 11/12/2018   Coronary artery disease involving native coronary artery of native heart without angina pectoris 08/18/2018   Hyperlipidemia LDL goal <70 08/18/2018   Preprocedural cardiovascular examination 08/18/2018   MRSA bacteremia    Infection of AV graft for dialysis ([place])    Bacteremia  02/16/2018   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   Chest pain, mid sternal 01/03/2018   Acute on chronic respiratory failure with hypoxia ([place]) 12/24/2017   Pain in right hand 08/25/2017   Elevated troponin 08/05/2017   Contracture of finger joint 46/27/0350   Complication from renal dialysis device 02/20/2017   ESRD (end stage renal disease) ([place]) 02/12/2017   Altered mental status 02/02/2017   Atherosclerosis of native arteries of extremity with intermittent claudication ([place]) 12/20/2016   Ulcer of amputation stump of lower extremity ([place]) 12/20/2016   Diabetes ([place]) 12/20/2016   Hypertension 12/20/2016   Pure hypercholesterolemia 12/20/2016   Bleeding 07/29/2016   Panic attack 09/38/1829   Chronic systolic heart failure ([place]) 10/27/2015   Type 2 diabetes mellitus with diabetic nephropathy, with long-term current use of insulin ([place]) 04/06/2015   Overweight (BMI 25.0-29.9) 12/04/2014   Vitamin D deficiency 03/17/2014   TIA (transient ischemic attack) 01/04/2014   Vision abnormalities 01/03/2014   Chronic hepatitis C virus infection ([place]) 11/01/2013   Arteriovenous fistula ([place]) 08/27/2013   CHF exacerbation ([place]) 12/12/2012   Constipation 02/01/2012   Ischemic cardiomyopathy 11/17/2010   Retinopathy, diabetic, background ([place]) 11/11/2008   Carotid artery stenosis 11/03/2008   Cerebral infarct ([place]) 10/31/2008   Cerebellar infarction ([place]) 07/04/2005   Amputation of leg ([place]) 02/02/2004   Poliomyelitis 07/04/1954    Palliative Care Assessment & Plan    Recommendations/Plan:  Continue current treatment. Palliative will continue to follow.   Daughter does not want any interventions that are not critically time sensitive until he is more stable.   Code Status:    Code Status Orders  (From admission, onward)         Start     Ordered   09/10/2019 1552  Do not attempt resuscitation (DNR)  Continuous    Question Answer Comment  In the event of  cardiac or respiratory ARREST Do not call a code blue   In the event of cardiac or  respiratory ARREST Do not perform Intubation, CPR, defibrillation or ACLS   In the event of cardiac or respiratory ARREST Use medication by any route, position, wound care, and other measures to relive pain and suffering. May use oxygen, suction and manual treatment of airway obstruction as needed for comfort.      09/25/2019 1552        Code Status History    Date Active Date Inactive Code Status Order ID Comments User Context   09/02/2019 0906 09/07/2019 1552 Full Code [id number]  Flora Lipps, MD ED   08/28/2019 2056 09/05/2019 2153 Full Code [id number]  Orene Desanctis, DO ED   12/04/2018 1347 12/04/2018 1826 Full Code [id number]  Katha Cabal, MD Inpatient   02/16/2018 1526 02/23/2018 2238 Full Code [id number]  Saundra Shelling, MD Inpatient   01/03/2018 2057 01/05/2018 1818 Full Code [id number]  Epifanio Lesches, MD ED   12/24/2017 1359 12/29/2017 1957 Full Code [id number]  Demetrios Loll, MD Inpatient   08/05/2017 1756 08/06/2017 1829 Full Code [id number]  Bettey Costa, MD Inpatient   02/02/2017 1830 02/05/2017 1825 Full Code [id number]  Henreitta Leber, MD Inpatient   Advance Care Planning Activity    Advance Directive Documentation     Most Recent Value  Type of Advance Directive  Healthcare Power of Attorney  Pre-existing out of facility DNR order (yellow form or pink MOST form)  --  "MOST" Form in Place?  --       Prognosis:  Very poor    Care plan was discussed with RN  Thank you for allowing the Palliative Medicine Team to assist in the care of this patient.   Total Time 35 min Prolonged Time Billed  no      Greater than 50%  of this time was spent counseling and coordinating care related to the above assessment and plan.  Asencion Gowda, NP  Please contact Palliative Medicine Team phone at [phone] for questions and concerns.

## 2019-09-10 NOTE — Progress Notes (Signed)
CRITICAL CARE NOTE 69 y.o.malethe history of ESRD on HD, diabetes, recent AKA of the left lower extremity, hypertension, hyperlipidemia, peripheral vascular disease on Plavix who presents from his nursing home for unresponsiveness.  Presented with- -His heart rate in the 30s. low glucose levels -An IO was placed in the field. Patient arrives in the emergency room with a GCS of 3, pulse in the 40s, repeat blood glucose in the 60s, and BP in the 60s. Patient had left and gaze deviation but no other signs of seizure.According to EMS patient has missed his last 2 dialysis treatment.    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  EYES: BLIND MOUTH: Moist mucosal membrane. NECK: Supple.  PULMONARY: +rhonchi, +wheezing CARDIOVASCULAR: S1 and S2. Regular rate and rhythm. No murmurs, rubs, or gallops.  GASTROINTESTINAL: Soft, nontender, -distended.  Positive bowel sounds.   MUSCULOSKELETAL: no legs NEUROLOGIC: obtunded, GCS<8 SKIN:intact,warm,dry  MEDICATIONS: I have reviewed all medications and confirmed regimen as documented   CULTURE RESULTS   Recent  Results (from the past 240 hour(s))  SARS CORONAVIRUS 2 (TAT 6-24 HRS) Nasopharyngeal Nasopharyngeal Swab     Status: None   Collection Time: 09/04/19  8:37 PM   Specimen: Nasopharyngeal Swab  Result Value Ref Range Status   SARS Coronavirus 2 NEGATIVE NEGATIVE Final    Comment: (NOTE) SARS-CoV-2 target nucleic acids are NOT DETECTED. The SARS-CoV-2 RNA is generally detectable in upper and lower respiratory specimens during the acute phase of infection. Negative results do not preclude SARS-CoV-2 infection, do not rule out co-infections with other pathogens, and should not be used as the sole basis for treatment or other patient management decisions. Negative results must be combined with clinical observations, patient history, and epidemiological information. The expected result is Negative. Fact Sheet for Patients: SugarRoll.be Fact Sheet for Healthcare Providers: https://www.woods-mathews.com/ This test is not yet approved or cleared by the [place] FDA and  has been authorized for detection and/or diagnosis of SARS-CoV-2 by FDA under an Emergency Use Authorization (EUA). This EUA will remain  in effect (meaning this test can be used) for the duration of the COVID-19 declaration under Section 56 4(b)(1) of the Act, 21 U.S.C. section 360bbb-3(b)(1), unless the authorization is terminated or revoked sooner. Performed at [facility], [place] [address] [place], [place] [address]   Blood culture (routine x 2)     Status: None (Preliminary result)   Collection Time: 09/19/2019  7:50 AM   Specimen: BLOOD  Result Value Ref Range Status   Specimen Description BLOOD RIGHT HAND  Final   Special Requests   Final    BOTTLES DRAWN AEROBIC AND ANAEROBIC Blood  Culture results may not be optimal due to an inadequate volume of blood received in culture bottles   Culture   Final    NO GROWTH < 24 HOURS Performed at [facility], [address] [place], [place] [address]    Report Status PENDING  Incomplete  Urine Culture     Status: None   Collection Time: 09/06/2019  8:03 AM   Specimen: Urine, Catheterized  Result Value Ref Range Status   Specimen Description   Final    URINE, CATHETERIZED Performed at [facility], [address], [place], [place] [address]    Special Requests   Final    Immunocompromised Performed at [facility], [address], [place], [place] [address]    Culture   Final    NO GROWTH Performed at [facility], [place] [address] [place], [place] [address]    Report Status 09/10/2019 FINAL  Final  Blood culture (routine x 2)     Status: None (Preliminary result)   Collection Time: 09/26/2019 11:46 AM   Specimen: BLOOD  Result Value Ref Range Status   Specimen Description BLOOD BLOOD RIGHT HAND  Final   Special Requests   Final    BOTTLES DRAWN AEROBIC AND ANAEROBIC Blood Culture adequate volume   Culture   Final    NO GROWTH < 24 HOURS Performed at [facility], [address] [place], [place] [address]    Report Status PENDING  Incomplete  MRSA PCR Screening     Status: None   Collection Time: 09/05/2019  1:19 PM   Specimen: Nasopharyngeal  Result Value Ref Range Status   MRSA by PCR NEGATIVE NEGATIVE Final    Comment:        The GeneXpert MRSA Assay (FDA approved for NASAL specimens only), is one component of a comprehensive MRSA colonization surveillance program. It is not intended to diagnose MRSA infection nor to guide or monitor treatment for MRSA infections. Performed at [facility], [address] [place], [place] [address]           IMAGING    DG Abd 1 View  Result Date: 09/06/2019 CLINICAL DATA:  Status post OG tube placement. EXAM: ABDOMEN - 1 VIEW COMPARISON:  None. FINDINGS: OG tube tip and side port are in the stomach. IMPRESSION: As above. Electronically Signed   By: Inge Rise M.D.   On: 09/10/2019  12:29   DG Chest Port 1 View  Result Date: 09/10/2019 CLINICAL DATA:  Interval change EXAM: PORTABLE CHEST 1 VIEW COMPARISON:  Yesterday FINDINGS: Endotracheal tube tip is at the clavicular heads. The enteric tube and side-port reaches the stomach. Cardiomegaly. Hazy opacity of the lower chest without definite change given differences in projection. No visible pneumothorax. Left IJ line with tip at the upper cavoatrial junction. Left subclavian stenting. IMPRESSION: 1. Hazy opacity at the bases, usually atelectasis and pleural fluid. 2. Stable hardware positioning. Electronically Signed   By: Monte Fantasia M.D.   On: 09/10/2019 05:22   DG Chest Port 1 View  Result Date: 09/29/2019 CLINICAL DATA:  Status post central line and OG tube placement. EXAM: PORTABLE CHEST 1 VIEW COMPARISON:  Single-view of the chest earlier today. FINDINGS: The patient's endotracheal tube is in good position with the tip just below the clavicular heads, 5 cm above the carina. New left IJ approach central venous catheter is in place with the tip projecting in the lower superior vena cava. OG tube tip  and side-port are in the stomach. No pneumothorax. Lungs clear. Heart size normal. Atherosclerosis. IMPRESSION: Support tubes and lines projecting good position.  No pneumothorax. Lungs clear. Electronically Signed   By: Inge Rise M.D.   On: 09/16/2019 12:28       Indwelling Urinary Catheter continued, requirement due to   Reason to continue Indwelling Urinary Catheter strict Intake/Output monitoring for hemodynamic instability   Central Line/ continued, requirement due to  Reason to continue [place] of central venous pressure or other hemodynamic parameters and poor IV access   Ventilator continued, requirement due to severe respiratory failure   Ventilator Sedation RASS 0 to -2      ASSESSMENT AND PLAN SYNOPSIS 69 yo AAM with multiple medical issues with ESRD on HD with Poor protoplasm and  poor vasculature with DIASTOLIC HEART FAILURE with acute and severe acidosis with severe toxic metabolic encephalopathy with multiorgan failure in septic shock  Severe ACUTE Hypoxic and Hypercapnic Respiratory Failure from septic shock and severe acidosis -continue Full MV support -continue Bronchodilator Therapy -Wean Fio2 and PEEP as tolerated   ACUTE DIASTOLIC CARDIAC FAILURE- Vent and vasopressors support   KIDNEY INJURY/Renal Failure -follow chem 7 -follow UO On CRRT   NEUROLOGY - intubated and sedated - minimal sedation to achieve a RASS goal: -1   SHOCK-SEPSIS/HYPOVOLUMIC/CARDIOGENIC -use vasopressors to keep MAP>65 -follow ABG and LA -follow up cultures -emperic ABX -stress dose steroids   CARDIAC ICU monitoring  ID -continue IV abx as prescibed -follow up cultures  GI GI PROPHYLAXIS as indicated  NUTRITIONAL STATUS DIET-->NPO Constipation protocol as indicated  ENDO - will use ICU hypoglycemic\Hyperglycemia protocol if indicated   ELECTROLYTES -follow labs as needed -replace as needed -pharmacy consultation and following   DVT/GI PRX ordered TRANSFUSIONS AS NEEDED MONITOR FSBS ASSESS the need for LABS as needed   PATIENT IS DNR  Critical Care Time devoted to patient care services described in this note is 45 minutes.   Overall, patient is critically ill, prognosis is guarded.  Patient with Multiorgan failure and at high risk for cardiac arrest and death.    Corrin Parker, M.D.  [facility]  Medical Director [place] Director [facility] Cardio-Pulmonary Department

## 2019-09-10 NOTE — Progress Notes (Signed)
Dr. Mortimer Fries notified of high systolic pressures, and low diastolic pressures. Orders to titrate pressures with a MAP greater than 65.

## 2019-09-10 NOTE — Progress Notes (Signed)
Foley removed per Dr. Mortimer Fries. IO removed by IV team. Dialysate changed to 2K per Dr. Holley Raring. Palliative updated family, refer to note.

## 2019-09-10 NOTE — Progress Notes (Signed)
Potassium level 6. Dr. Holley Raring made aware. New orders to change Dialysate fluids to 2k bath.

## 2019-09-10 NOTE — Consult Note (Signed)
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 0500)     Status: Abnormal   Collection Time: 09/10/19  4:37 AM  Result Value Ref Range   Sodium 134 (L) 135 - 145 mmol/L    Comment:  ELECTROLYTES REPEATED SDR   Potassium 4.5 3.5 - 5.1 mmol/L   Chloride 97 (L) 98 - 111 mmol/L   CO2 20 (L) 22 - 32 mmol/L   Glucose, Bld 371 (H) 70 - 99 mg/dL    Comment: Glucose reference range applies only to samples taken after fasting for  at least 8 hours.   BUN 51 (H) 8 - 23 mg/dL   Creatinine, Ser 7.99 (H) 0.61 - 1.24 mg/dL   Calcium 6.9 (L) 8.9 - 10.3 mg/dL   Phosphorus 3.9 2.5 - 4.6 mg/dL   Albumin 2.4 (L) 3.5 - 5.0 g/dL   GFR calc non Af Amer 6 (L) >60 mL/min   GFR calc Af Amer 7 (L) >60 mL/min   Anion gap 17 (H) 5 - 15    Comment: Performed at [facility], [address] [place], [place] [address]  Protime-INR     Status: Abnormal   Collection Time: 09/10/19  4:37 AM  Result Value Ref Range   Prothrombin Time 51.6 (H) 11.4 - 15.2 seconds   INR 5.7 (HH) 0.8 - 1.2    Comment: CRITICAL RESULT CALLED TO, READ BACK BY AND VERIFIED WITH: Rhodell [phone] 09/10/19 HNM (NOTE) INR goal varies based on device and disease states. Performed at [facility], [address] [place], [place] [address]   APTT     Status: Abnormal   Collection Time: 09/10/19  4:37 AM  Result Value Ref Range   aPTT 75 (H) 24 - 36 seconds    Comment:        IF BASELINE aPTT IS ELEVATED, SUGGEST PATIENT RISK ASSESSMENT BE USED TO DETERMINE APPROPRIATE ANTICOAGULANT THERAPY. Performed at [facility], [place], [place] [address]   Troponin I (High Sensitivity)     Status: Abnormal   Collection Time: 09/10/19  4:37 AM  Result Value Ref Range   Troponin I (High Sensitivity) 3,810 (HH) <18 ng/L    Comment: CRITICAL VALUE NOTED. VALUE IS CONSISTENT WITH PREVIOUSLY REPORTED/CALLED VALUE  SDR (NOTE) Elevated high sensitivity troponin I (hsTnI) values and significant  changes across serial measurements may suggest ACS but many other  chronic and acute conditions are known to elevate hsTnI results.  Refer to the "Links" section for chest pain algorithms and additional  guidance. Performed at [facility], [address] [place], [place] [address]   Blood gas, arterial  Status: Abnormal   Collection Time: 09/10/19  8:00 AM  Result Value Ref Range   FIO2 0.40    Delivery  systems VENTILATOR    Mode PRESSURE REGULATED VOLUME CONTROL    VT 450 mL   LHR 20 resp/min   Peep/cpap 5.0 cm H20   pH, Arterial 7.42 7.350 - 7.450   pCO2 arterial 29 (L) 32.0 - 48.0 mmHg   pO2, Arterial 170 (H) 83.0 - 108.0 mmHg   Bicarbonate 18.8 (L) 20.0 - 28.0 mmol/L   Acid-base deficit 4.4 (H) 0.0 - 2.0 mmol/L   O2 Saturation 99.6 %   Patient temperature 37.0    Collection site LEFT BRACHIAL    Sample type ARTERIAL DRAW     Comment: Performed at [facility], [address] [place], [place] [address]  Glucose, capillary     Status: Abnormal   Collection Time: 09/10/19  8:01 AM  Result Value Ref Range   Glucose-Capillary 323 (H) 70 - 99 mg/dL    Comment: Glucose reference range applies only to samples taken after fasting for at least 8 hours.  Protime-INR     Status: Abnormal   Collection Time: 09/10/19 10:05 AM  Result Value Ref Range   Prothrombin Time 38.9 (H) 11.4 - 15.2 seconds   INR 4.0 (H) 0.8 - 1.2    Comment: (NOTE) INR goal varies based on device and disease states. Performed at [facility], [address] [place], [place] [address]   Glucose, capillary     Status: Abnormal   Collection Time: 09/10/19 10:13 AM  Result Value Ref Range   Glucose-Capillary 289 (H) 70 - 99 mg/dL    Comment: Glucose reference range applies only to samples taken after fasting for at least 8 hours.  Glucose, capillary     Status: Abnormal   Collection Time: 09/10/19 12:03 PM  Result Value Ref Range   Glucose-Capillary 272 (H) 70 - 99 mg/dL    Comment: Glucose reference range applies only to samples taken after fasting for at least 8 hours.   No components found for: ESR, C REACTIVE PROTEIN MICRO: Recent Results (from the past 720 hour(s))  Blood Culture (routine x 2)     Status: Abnormal   Collection Time: 08/28/19  5:46 PM   Specimen: BLOOD  Result Value Ref Range Status   Specimen Description   Final    BLOOD RESIDENT Performed at [facility], [address], [place], [place] [address]    Special Requests   Final    BOTTLES DRAWN AEROBIC AND ANAEROBIC Blood Culture results may not be optimal due to an inadequate volume of blood received in culture bottles Performed at [facility], [address] [place], [place] [address]    Culture  Setup Time   Final    GRAM POSITIVE COCCI AEROBIC BOTTLE ONLY CRITICAL RESULT CALLED TO, READ BACK BY AND VERIFIED WITH: JASON ROBBINS 08/29/19 @ 1803  [place] Performed at [facility], [address] [place], [place] [address]    Culture STAPHYLOCOCCUS CAPITIS (A)  Final   Report Status 09/07/2019 FINAL  Final   Organism ID, Bacteria STAPHYLOCOCCUS CAPITIS  Final      Susceptibility   Staphylococcus capitis - MIC*    CIPROFLOXACIN >=8 RESISTANT Resistant     ERYTHROMYCIN >=8 RESISTANT Resistant     GENTAMICIN <=0.5 SENSITIVE Sensitive     OXACILLIN <=0.25 SENSITIVE Sensitive     TETRACYCLINE >=16 RESISTANT Resistant  VANCOMYCIN <=0.5 SENSITIVE Sensitive     TRIMETH/SULFA <=10 SENSITIVE Sensitive     CLINDAMYCIN >=8 RESISTANT Resistant     RIFAMPIN <=0.5 SENSITIVE Sensitive     Inducible Clindamycin NEGATIVE Sensitive     * STAPHYLOCOCCUS CAPITIS  Respiratory Panel by RT PCR (Flu A&B, Covid) - Nasopharyngeal Swab     Status: None   Collection Time: 08/28/19  7:32 PM   Specimen: Nasopharyngeal Swab  Result Value Ref Range Status   SARS Coronavirus 2 by RT PCR NEGATIVE NEGATIVE Final    Comment: (NOTE) SARS-CoV-2 target nucleic acids are NOT DETECTED. The SARS-CoV-2 RNA is generally detectable in upper respiratoy specimens during the acute phase of infection. The lowest concentration of SARS-CoV-2 viral copies this assay can detect is 131 copies/mL. A negative result does not preclude SARS-Cov-2 infection and should not be used as the sole basis for treatment or other patient management decisions. A negative result may occur with  improper specimen  collection/handling, submission of specimen other than nasopharyngeal swab, presence of viral mutation(s) within the areas targeted by this assay, and inadequate number of viral copies (<131 copies/mL). A negative result must be combined with clinical observations, patient history, and epidemiological information. The expected result is Negative. Fact Sheet for Patients:  PinkCheek.be Fact Sheet for Healthcare Providers:  GravelBags.it This test is not yet ap proved or cleared by the [place] FDA and  has been authorized for detection and/or diagnosis of SARS-CoV-2 by FDA under an Emergency Use Authorization (EUA). This EUA will remain  in effect (meaning this test can be used) for the duration of the COVID-19 declaration under Section 564(b)(1) of the Act, 21 U.S.C. section 360bbb-3(b)(1), unless the authorization is terminated or revoked sooner.    Influenza A by PCR NEGATIVE NEGATIVE Final   Influenza B by PCR NEGATIVE NEGATIVE Final    Comment: (NOTE) The Xpert Xpress SARS-CoV-2/FLU/RSV assay is intended as an aid in  the diagnosis of influenza from Nasopharyngeal swab specimens and  should not be used as a sole basis for treatment. Nasal washings and  aspirates are unacceptable for Xpert Xpress SARS-CoV-2/FLU/RSV  testing. Fact Sheet for Patients: PinkCheek.be Fact Sheet for Healthcare Providers: GravelBags.it This test is not yet approved or cleared by the [place] FDA and  has been authorized for detection and/or diagnosis of SARS-CoV-2 by  FDA under an Emergency Use Authorization (EUA). This EUA will remain  in effect (meaning this test can be used) for the duration of the  Covid-19 declaration under Section 564(b)(1) of the Act, 21  U.S.C. section 360bbb-3(b)(1), unless the authorization is  terminated or revoked. Performed at [facility],  [address] [place], [place] [address]   MRSA PCR Screening     Status: None   Collection Time: 08/29/19 12:16 AM   Specimen: Foot, Left; Nasopharyngeal  Result Value Ref Range Status   MRSA by PCR NEGATIVE NEGATIVE Final    Comment:        The GeneXpert MRSA Assay (FDA approved for NASAL specimens only), is one component of a comprehensive MRSA colonization surveillance program. It is not intended to diagnose MRSA infection nor to guide or monitor treatment for MRSA infections. Performed at [facility], [address] [place], [place] [address]   Blood Culture (routine x 2)     Status: None   Collection Time: 08/29/19  1:31 AM   Specimen: Leg; Blood  Result Value Ref Range Status   Specimen Description LEG  Final  Special Requests Blood Culture adequate volume  Final   Culture   Final    NO GROWTH 5 DAYS Performed at [facility], [address] [place], [place] [address]    Report Status 09/03/2019 FINAL  Final  Culture, blood (single) w Reflex to ID Panel     Status: None   Collection Time: 08/30/19  6:12 PM   Specimen: BLOOD  Result Value Ref Range Status   Specimen Description BLOOD RAC  Final   Special Requests   Final    BOTTLES DRAWN AEROBIC AND ANAEROBIC Blood Culture adequate volume   Culture   Final    NO GROWTH 5 DAYS Performed at [facility], [address] [place], [place] [address]    Report Status 09/04/2019 FINAL  Final  Culture, blood (single) w Reflex to ID Panel     Status: None   Collection Time: 08/30/19 10:27 PM   Specimen: BLOOD  Result Value Ref Range Status   Specimen Description BLOOD RIGHT ANTECUBITAL  Final   Special Requests   Final    BOTTLES DRAWN AEROBIC AND ANAEROBIC Blood Culture adequate volume   Culture   Final    NO GROWTH 5 DAYS Performed at [facility], [address], [place], [place] [address]    Report Status 09/04/2019 FINAL  Final  SARS CORONAVIRUS 2 (TAT 6-24 HRS)  Nasopharyngeal Nasopharyngeal Swab     Status: None   Collection Time: 09/04/19  8:37 PM   Specimen: Nasopharyngeal Swab  Result Value Ref Range Status   SARS Coronavirus 2 NEGATIVE NEGATIVE Final    Comment: (NOTE) SARS-CoV-2 target nucleic acids are NOT DETECTED. The SARS-CoV-2 RNA is generally detectable in upper and lower respiratory specimens during the acute phase of infection. Negative results do not preclude SARS-CoV-2 infection, do not rule out co-infections with other pathogens, and should not be used as the sole basis for treatment or other patient management decisions. Negative results must be combined with clinical observations, patient history, and epidemiological information. The expected result is Negative. Fact Sheet for Patients: SugarRoll.be Fact Sheet for Healthcare Providers: https://www.woods-mathews.com/ This test is not yet approved or cleared by the [place] FDA and  has been authorized for detection and/or diagnosis of SARS-CoV-2 by FDA under an Emergency Use Authorization (EUA). This EUA will remain  in effect (meaning this test can be used) for the duration of the COVID-19 declaration under Section 56 4(b)(1) of the Act, 21 U.S.C. section 360bbb-3(b)(1), unless the authorization is terminated or revoked sooner. Performed at [facility], [address], [place], [place] [address]   Blood culture (routine x 2)     Status: None (Preliminary result)   Collection Time: 09/15/2019  7:50 AM   Specimen: BLOOD  Result Value Ref Range Status   Specimen Description BLOOD RIGHT HAND  Final   Special Requests   Final    BOTTLES DRAWN AEROBIC AND ANAEROBIC Blood Culture results may not be optimal due to an inadequate volume of blood received in culture bottles   Culture   Final    NO GROWTH < 24 HOURS Performed at [facility], [address] [place], [place] [address]    Report Status PENDING   Incomplete  Urine Culture     Status: None   Collection Time: 09/11/2019  8:03 AM   Specimen: Urine, Catheterized  Result Value Ref Range Status   Specimen Description   Final    URINE, CATHETERIZED Performed at  [facility], [address] [place], [place] [address]    Special Requests   Final    Immunocompromised Performed at [facility], [address] [place], [place] [address]    Culture   Final    NO GROWTH Performed at [facility], [address] [place], [place] [address]    Report Status 09/10/2019 FINAL  Final  Blood culture (routine x 2)     Status: None (Preliminary result)   Collection Time: 09/24/2019 11:46 AM   Specimen: BLOOD  Result Value Ref Range Status   Specimen Description BLOOD BLOOD RIGHT HAND  Final   Special Requests   Final    BOTTLES DRAWN AEROBIC AND ANAEROBIC Blood Culture adequate volume   Culture   Final    NO GROWTH < 24 HOURS Performed at [facility], [address] [place], [place] [address]    Report Status PENDING  Incomplete  MRSA PCR Screening     Status: None   Collection Time: 09/27/2019  1:19 PM   Specimen: Nasopharyngeal  Result Value Ref Range Status   MRSA by PCR NEGATIVE NEGATIVE Final    Comment:        The GeneXpert MRSA Assay (FDA approved for NASAL specimens only), is one component of a comprehensive MRSA colonization surveillance program. It is not intended to diagnose MRSA infection nor to guide or monitor treatment for MRSA infections. Performed at [facility], [address] [place], [place] [address]     IMAGING: [place] Chest 1 View  Result Date: 09/26/2019 CLINICAL DATA:  Pt to ED via EMS from [facility]. Pt unresponsive upon staff assessment. Unknown last known well. Pt was seen recently for "not acting right" but was admitted and later discharged. Right arm is currently being used for dialysis. Post intubation EXAM: CHEST  1 VIEW COMPARISON:  09/07/2019  FINDINGS: Endotracheal to 3.7 cm from carina. NG to extends in the stomach enlarged cardiac silhouette. No pulmonary edema. No pneumothorax. Vascular stent in LEFT subclavian vein. IMPRESSION: 1. Intubation without complication.  NG tube in stomach. 2. Stable cardiomegaly Electronically Signed   By: Suzy Bouchard M.D.   On: 10/01/2019 07:36   DG Abd 1 View  Result Date: 09/24/2019 CLINICAL DATA:  Status post OG tube placement. EXAM: ABDOMEN - 1 VIEW COMPARISON:  None. FINDINGS: OG tube tip and side port are in the stomach. IMPRESSION: As above. Electronically Signed   By: Inge Rise M.D.   On: 09/07/2019 12:29   CT Head Wo Contrast  Result Date: 09/21/2019 CLINICAL DATA:  Patient unresponsive. EXAM: CT HEAD WITHOUT CONTRAST TECHNIQUE: Contiguous axial images were obtained from the base of the skull through the vertex without intravenous contrast. COMPARISON:  12/24/2017 FINDINGS: Brain: No evidence of acute infarction, hemorrhage, hydrocephalus, extra-axial collection or mass lesion/mass effect. Patchy areas of bilateral white matter hypoattenuation are noted, stable consistent mild chronic microvascular ischemic change. 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 Vascular: No hyperdense vessel or unexpected calcification. Skull: Normal. Negative for fracture or focal lesion. 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 No acute findings for the globes or orbits. Visualized sinuses are clear. Other: None. IMPRESSION: 1. No acute intracranial abnormalities. Stable appearance from the prior head CT. Electronically Signed   By: Lajean Manes M.D.   On: 09/08/2019 07:23   PERIPHERAL VASCULAR CATHETERIZATION  Result Date: 08/30/2019 See op note  DG Chest Port 1 View  Result Date: 09/10/2019 CLINICAL DATA:  Interval change EXAM: PORTABLE CHEST 1 VIEW COMPARISON:  Yesterday FINDINGS: Endotracheal tube tip is at the clavicular heads. The enteric tube and side-port reaches the  stomach. Cardiomegaly. Hazy opacity of the lower chest without definite change given differences in projection. No visible pneumothorax. Left IJ line with tip at the upper cavoatrial junction. Left subclavian stenting. IMPRESSION: 1. Hazy opacity at the bases, usually atelectasis and pleural fluid. 2. Stable hardware positioning. Electronically Signed   By: Monte Fantasia M.D.   On: 09/10/2019 05:22   DG Chest Port 1 View  Result Date: 09/27/2019 CLINICAL DATA:  Status post central line and OG tube placement. EXAM: PORTABLE CHEST 1 VIEW COMPARISON:  Single-view of the chest earlier today. FINDINGS: The patient's endotracheal tube is in good position with the tip just below the clavicular heads, 5 cm above the carina. New left IJ approach central venous catheter is in place with the tip projecting in the lower superior vena cava. OG tube tip and side-port are in the stomach. No pneumothorax. Lungs clear. Heart size normal. Atherosclerosis. IMPRESSION: Support tubes and lines projecting good position.  No pneumothorax. Lungs clear. Electronically Signed   By: Inge Rise M.D.   On: 09/10/2019 12:28   DG Chest Portable 1 View  Result Date: 09/07/2019 CLINICAL DATA:  69 year old male with history of shortness of breath. EXAM: PORTABLE CHEST 1 VIEW COMPARISON:  Chest x-ray 08/28/2019. FINDINGS: Lung volumes are low. No consolidative airspace disease. No pleural effusions. No pneumothorax. No pulmonary nodule or mass noted. Pulmonary vasculature and the cardiomediastinal silhouette are within normal limits. Aortic atherosclerosis. Vascular stent projecting over the left subclavian region. IMPRESSION: 1. Low lung volumes without radiographic evidence of acute cardiopulmonary disease. 2. Aortic atherosclerosis. Electronically Signed   By: Vinnie Langton M.D.   On: 09/07/2019 10:19   DG Chest Port 1 View  Result Date: 08/28/2019 CLINICAL DATA:  Possible left foot infection with skin changes. EXAM: PORTABLE  CHEST 1 VIEW COMPARISON:  Radiographs 02/16/2018 and 01/23/2018. FINDINGS: 1758 hours. Stable mild cardiomegaly and aortic atherosclerosis. There is a left subclavian vascular stent. Mild interstitial prominence in both lungs appears unchanged. No evidence of superimposed edema, confluent airspace opacity, pneumothorax or significant pleural effusion. The bones appear unchanged. Telemetry leads overlie the chest. IMPRESSION: Stable chest with mild cardiomegaly and chronic interstitial prominence. No evidence of pneumonia. Electronically Signed   By: Richardean Sale M.D.   On: 08/28/2019 18:36   DG Foot Complete Left  Result Date: 08/28/2019 CLINICAL DATA:  Possible left foot infection. Progressive symptoms over the last 5 days with skin darkening of the toes and foul odor. EXAM: LEFT FOOT - COMPLETE 3+ VIEW COMPARISON:  None. FINDINGS: The bones appear adequately mineralized. There is no evidence of acute fracture or dislocation. The soft tissues of the 2nd toe appear atretic with possible exposure of the distal tuft. There are possible erosions of the distal tuft of the 2nd digit as well as the great toe. No other evidence of bone destruction. Extensive vascular calcifications are noted. There is mild dorsal forefoot soft tissue swelling without soft tissue emphysema or foreign body. IMPRESSION: Atretic soft tissues of the 2nd toe suspicious for infarction. Possible tuft erosions of the distal 1st and 2nd digits. These findings could reflect early osteomyelitis. Extensive vascular calcifications. Electronically Signed   By: Richardean Sale M.D.   On: 08/28/2019 18:40   ECHOCARDIOGRAM COMPLETE  Result Date: 09/03/2019    ECHOCARDIOGRAM REPORT   Patient Name:   Dennis Walker  Mccallister Date of Exam: 09/03/2019 Medical Rec #:  [id number]    Height:       71.0 in Accession #:    [id number]   Weight:       163.4 lb Date of Birth:  09-Oct-1950    BSA:          1.935 m Patient Age:    17 years     BP:           152/48 mmHg Patient  Gender: M            HR:           78 bpm. Exam Location:  [facility] Procedure: 2D Echo, Color Doppler and Cardiac Doppler Indications:     R78.81 Bacteremia  History:         Patient has prior history of Echocardiogram examinations. CHF,                  Previous Myocardial Infarction and CAD, Stroke and PVD, ESRD;                  Risk Factors:Hypertension, Dyslipidemia and Diabetes.  Sonographer:     Charmayne Sheer RDCS (AE) Referring Phys:  [id number] Floyce Stakes GRIFFITH Diagnosing Phys: Ida Rogue MD  Sonographer Comments: Suboptimal apical window. IMPRESSIONS  1. Left ventricular ejection fraction, by estimation, is 55 to 60%. The left ventricle has normal function. The left ventricle has no regional wall motion abnormalities. There is moderate left ventricular hypertrophy. Left ventricular diastolic parameters are consistent with Grade I diastolic dysfunction (impaired relaxation).  2. Right ventricular systolic function is moderately reduced. The right ventricular size is moderate to severely enlarged. Tricuspid regurgitation signal is inadequate for assessing PA pressure. Septal flattening ande RV dilation concerning for elevated  RV pressure and volume.  3. Left atrial size was mildly dilated.  4. Right atrial size was moderately dilated.  5. Tricuspid valve regurgitation is mild to moderate.  6. The aortic valve has heavy sclerosis, Very mild aortic valve stenosis.  7. The inferior vena cava is normal in size with greater than 50% respiratory variability, suggesting right atrial pressure of 3 mmHg.  8. No valve vegetation noted. FINDINGS  Left Ventricle: Left ventricular ejection fraction, by estimation, is 55 to 60%. The left ventricle has normal function. The left ventricle has no regional wall motion abnormalities. The left ventricular internal cavity size was normal in size. There is  moderate left ventricular hypertrophy. Left ventricular diastolic parameters are consistent with Grade I diastolic dysfunction  (impaired relaxation). Right Ventricle: The right ventricular size is moderately enlarged. No increase in right ventricular wall thickness. Right ventricular systolic function is moderately reduced. Tricuspid regurgitation signal is inadequate for assessing PA pressure. Left Atrium: Left atrial size was mildly dilated. Right Atrium: Right atrial size was moderately dilated. Pericardium: There is no evidence of pericardial effusion. Mitral Valve: The mitral valve is normal in structure and function. Normal mobility of the mitral valve leaflets. No evidence of mitral valve regurgitation. No evidence of mitral valve stenosis. MV peak gradient, 5.3 mmHg. The mean mitral valve gradient is 3.0 mmHg. Tricuspid Valve: The tricuspid valve is normal in structure. Tricuspid valve regurgitation is mild to moderate. No evidence of tricuspid stenosis. Aortic Valve: The aortic valve is normal in structure and function. Aortic valve regurgitation is not visualized. Mild aortic stenosis is present. Aortic valve mean gradient measures 8.0 mmHg. Aortic valve peak gradient measures 17.1 mmHg. Aortic valve area, by VTI measures  1.29 cm. Pulmonic Valve: The pulmonic valve was normal in structure. Pulmonic valve regurgitation is not visualized. No evidence of pulmonic stenosis. Aorta: The aortic root is normal in size and structure. Venous: The inferior vena cava is normal in size with greater than 50% respiratory variability, suggesting right atrial pressure of 3 mmHg. IAS/Shunts: No atrial level shunt detected by color flow Doppler.  LEFT VENTRICLE PLAX 2D LVIDd:         3.77 cm  Diastology LVIDs:         2.05 cm  LV e' lateral:   10.00 cm/s LV PW:         1.40 cm  LV E/e' lateral: 9.9 LV IVS:        0.76 cm  LV e' medial:    5.98 cm/s LVOT diam:     1.80 cm  LV E/e' medial:  16.6 LV SV:         46 LV SV Index:   24 LVOT Area:     2.54 cm  RIGHT VENTRICLE RV Basal diam:  3.39 cm LEFT ATRIUM             Index       RIGHT ATRIUM            Index LA diam:        4.10 cm 2.12 cm/m  RA Area:     18.40 cm LA Vol (A2C):   67.9 ml 35.09 ml/m RA Volume:   47.50 ml  24.55 ml/m LA Vol (A4C):   86.8 ml 44.86 ml/m LA Biplane Vol: 80.7 ml 41.71 ml/m  AORTIC VALVE                    PULMONIC VALVE AV Area (Vmax):    1.24 cm     PV Vmax:       0.84 m/s AV Area (Vmean):   1.39 cm     PV Vmean:      46.600 cm/s AV Area (VTI):     1.29 cm     PV VTI:        0.124 m AV Vmax:           207.00 cm/s  PV Peak grad:  2.9 mmHg AV Vmean:          123.000 cm/s PV Mean grad:  1.0 mmHg AV VTI:            0.353 m AV Peak Grad:      17.1 mmHg AV Mean Grad:      8.0 mmHg LVOT Vmax:         101.00 cm/s LVOT Vmean:        67.300 cm/s LVOT VTI:          0.179 m LVOT/AV VTI ratio: 0.51  AORTA Ao Root diam: 2.90 cm MITRAL VALVE MV Area (PHT): 3.37 cm     SHUNTS MV Peak grad:  5.3 mmHg     Systemic VTI:  0.18 m MV Mean grad:  3.0 mmHg     Systemic Diam: 1.80 cm MV Vmax:       1.15 m/s MV Vmean:      82.2 cm/s MV Decel Time: 225 msec MV E velocity: 99.00 cm/s MV A velocity: 107.00 cm/s MV E/A ratio:  0.93 Ida Rogue MD Electronically signed by Ida Rogue MD Signature Date/Time: 09/03/2019/11:41:02 AM    Final     Assessment:   [name] is a 69 y.o.  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 Currently on pressors, CRRT, intubated. WBC initially mildly elevated but then increased. Lactic acid quite high initially. Was supposed to be on IV vanco at HD but missed one dose over the weekend after dc as did not go to HD. I suspect this is more a cardiac event  given the bradycardia. Right now no positive cultures, no fever.  Recent surgery site wnl.  Recommendations Continue supportive care. Cont broad spectrum abx - vanco and zosyn Await bcx.  No signs of undrained abscess or other source of infection.  Thank you very much for allowing me to participate in the care of this patient. Please call with questions.   Cheral Marker. Ola Spurr, MD

## 2019-09-10 NOTE — Progress Notes (Signed)
Initial Nutrition Assessment  RD working remotely.  DOCUMENTATION CODES:   Not applicable  INTERVENTION:  Plan is to hold off on initiation of tube feeds today. Once patient appropriate for tube feeds recommend: -Initiate Vital 1.5 Cal at 20 mL/hr and advance by 10 mL/hr every 8 hours to goal rate of 40 mL/hr (960 mL goal daily volume) -Pro-Stat 60 mL BID per tube -Goal regimen provides 1840 kcal, 125 grams of protein, 730 mL H2O daily -Minimum free water flush of 20-30 mL Q4hrs to maintain tube patency -B-complex with C QHS per tube  NUTRITION DIAGNOSIS:   Inadequate oral intake related to inability to eat as evidenced by NPO status.  GOAL:   Provide needs based on ASPEN/SCCM guidelines  MONITOR:   Vent status, Labs, Weight trends, Skin, I & O's  REASON FOR ASSESSMENT:   Ventilator    ASSESSMENT:   68 year old male with PMHx of DM, HTN, HLD, hx CVA, PVD, CHF, hx MI, CAD, asthma, depression, anxiety, arthritis, polio in childhood, ESRD on HD, hx right BKA, recent left AKA admitted with acute diastolic heart failure, acidosis, toxic metabolic encephalopathy requiring intubation.   Patient is currently intubated on ventilator support MV: 9 L/min Temp (24hrs), Avg:99.4 F (37.4 C), Min:98.2 F (36.8 C), Max:100 F (37.8 C)  Propofol: N/A  Medications reviewed and include: Solu-Cortef 50 mg Q6hrs, pantoprazole, fentanyl gtt, phenylephrine gtt at 250 mcg/min, Zosyn, vasopressin gtt at 0.03 units/min.  Labs reviewed: CBG 289-323, Sodium 134, Chloride 97, CO2 20, BUN 51, Anion gap 17.  Enteral Access: 14 Fr. OGT  Discussed with RN via secure chat. Plan is to hold off on tube feeds at this time. According to chart patient is on CRRT.  NUTRITION - FOCUSED PHYSICAL EXAM:  Unable to complete at this time as RD working remotely.  Diet Order:   Diet Order            Diet NPO time specified  Diet effective now             EDUCATION NEEDS:   No education needs  have been identified at this time  Skin:  Skin Assessment: Skin Integrity Issues:(stg II buttocks)  Last BM:  09/10/2019 large type 4  Height:   Ht Readings from Last 1 Encounters:  09/07/19 5\' 11"  (1.803 m)   Weight:   Wt Readings from Last 1 Encounters:  09/10/19 72.6 kg   Ideal Body Weight:  78.2 kg  BMI:  Body mass index is 22.32 kg/m.  Estimated Nutritional Needs:   Kcal:  1846 (PSU 2003b w/ MSJ 1528, Ve 9, Tmax 37.8)  Protein:  110-130 grams on CRRT  Fluid:  UOP + 1 L  Jacklynn Barnacle, MS, RD, LDN Pager number available on Amion

## 2019-09-10 NOTE — Progress Notes (Signed)
Assisted tele visit to patient with family member.  Dennis Walker M, RN  

## 2019-09-10 NOTE — Progress Notes (Signed)
Updated pt's daughter Theodoro Grist via telephone of her father's status and plan of care.  Informed that he remains critically ill, but his vent requirements and vasopressor requirements are slowly improving.  Vascular surgery, ID, and Cardiology are following along.  Will  Continue to current supportive measures and continue to monitor daily for improvement.  He remains DNR/DNI in the event that he were to deteriorate.  All questions answered, she is very appreciative of update.    Darel Hong, AGACNP-BC [facility] Pager: [phone]
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
## 2019-09-10 NOTE — Progress Notes (Signed)
[facility] Daily Progress Note   Procedure: 08/31/19:Left above-the-knee amputation (Dr. Lorenso Courier)  Subjective: Patient presented to the [facility] emergency department via EMS on September 09, 2019 unresponsive from [facility]. EPIC notation notable for the patient missing 2 dialysis treatments.  Patient was transferred to the ICU for severe acute hypoxic and hypercapnic respiratory failure from severe acidosis, sepsis with multisystem organ failure.  Patient was intubated, pressors were started, CRRT initiated.  The patient has a past medical history of end-stage renal disease with a functioning left upper extremity dialysis access.  This access is a graft.  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  Vascular surgery was consulted by Dr. Mortimer Fries to assess if dialysis graft or newly created stump can be source of sepsis.  Objective: Vitals:   09/10/19 1000 09/10/19 1030 09/10/19 1100 09/10/19 1130  BP: (!) 143/47 (!) 150/63 (!) 153/51 (!) 150/47  Pulse: 67 66 66 65  Resp: 20 20 16 16   Temp: 98.8 F (37.1 C) 98.6 F (37 C) 98.6 F (37 C) 98.6 F (37 C)  TempSrc:   Bladder   SpO2: 98% 99% 99% 99%  Weight:        Intake/Output Summary (Last 24 hours) at 09/10/2019 1226 Last data filed at 09/10/2019 1200 Gross per 24 hour  Intake 2532.98 ml  Output 1968 ml  Net 564.98 ml   Physical Exam: Unresponsive, Sedated, on vent. CV: RRR Pulmonary: Diminished bilaterally Abdomen: Soft, Nontender, Nondistended Vascular:  Left lower extremity:    AKA stump: Thigh soft.  A few staples have been removed over the weekend to allow seroma to drain.  Now with minimal drainage.  Incision line is healthy.    Photos    09/10/2019 11:41  Attached To:  Hospital Encounter on 09/08/2019   Broghan Pannone, Janalyn Harder, PA-C   [facility]    Left upper extremity:   Dialysis graft: Skin is intact.  No erythema.  No fluctuance.  Good bruit and thrill.    Laboratory: CBC    Component Value Date/Time   WBC 19.9 (H) 09/10/2019 0223   HGB 8.8 (L) 09/10/2019 0223   HCT 27.8 (L) 09/10/2019 0223   PLT 240 09/10/2019 0223   BMET    Component Value Date/Time   NA 134 (L) 09/10/2019 0437   K 4.5 09/10/2019 0437   CL 97 (L) 09/10/2019 0437   CO2 20 (L) 09/10/2019 0437   GLUCOSE 371 (H) 09/10/2019 0437   BUN 51 (H) 09/10/2019 0437   CREATININE 7.99 (H) 09/10/2019 0437   CALCIUM 6.9 (L) 09/10/2019 0437   GFRNONAA 6 (L) 09/10/2019 0437   GFRAA 7 (L) 09/10/2019 0437   Assessment/Planning: The patient is a 69 year old male with multiple medical issues including end-stage renal disease with a left upper extremity dialysis graft for access and a recently created left above-the-knee amputation (08/31/19)  1) patient seen and examined with Dr. Delana Meyer 2) at this time, the patient's left AKA and left upper extremity dialysis access do not seem to be infected. 3) excision of the patient's left upper extremity dialysis graft would have an extremely high mortality morbidity given his critical status. 4) if patient becomes more stable, would possibly consider a tagged white blood cell scan 5) will continue to follow  Marcelle Overlie PA-C 09/10/2019 12:26 PM

## 2019-09-11 DIAGNOSIS — J9602 Acute respiratory failure with hypercapnia: Secondary | ICD-10-CM

## 2019-09-11 LAB — RENAL FUNCTION PANEL
Albumin: 2.5 g/dL — ABNORMAL LOW (ref 3.5–5.0)
Albumin: 2.4 g/dL — ABNORMAL LOW (ref 3.5–5.0)
Anion gap: 18 — ABNORMAL HIGH (ref 5–15)
Anion gap: 18 — ABNORMAL HIGH (ref 5–15)
BUN: 42 mg/dL — ABNORMAL HIGH (ref 8–23)
BUN: 41 mg/dL — ABNORMAL HIGH (ref 8–23)
CO2: 20 mmol/L — ABNORMAL LOW (ref 22–32)
CO2: 20 mmol/L — ABNORMAL LOW (ref 22–32)
Calcium: 7.5 mg/dL — ABNORMAL LOW (ref 8.9–10.3)
Calcium: 8.2 mg/dL — ABNORMAL LOW (ref 8.9–10.3)
Chloride: 99 mmol/L (ref 98–111)
Chloride: 100 mmol/L (ref 98–111)
Creatinine, Ser: 5.7 mg/dL — ABNORMAL HIGH (ref 0.61–1.24)
Creatinine, Ser: 4.8 mg/dL — ABNORMAL HIGH (ref 0.61–1.24)
GFR calc Af Amer: 11 mL/min — ABNORMAL LOW (ref >60)
GFR calc Af Amer: 13 mL/min — ABNORMAL LOW (ref >60)
GFR calc non Af Amer: 9 mL/min — ABNORMAL LOW (ref >60)
GFR calc non Af Amer: 12 mL/min — ABNORMAL LOW (ref >60)
Glucose, Bld: 235 mg/dL — ABNORMAL HIGH (ref 70–99)
Glucose, Bld: 167 mg/dL — ABNORMAL HIGH (ref 70–99)
Phosphorus: 5 mg/dL — ABNORMAL HIGH (ref 2.5–4.6)
Phosphorus: 4.2 mg/dL (ref 2.5–4.6)
Potassium: 5.6 mmol/L — ABNORMAL HIGH (ref 3.5–5.1)
Potassium: 4.9 mmol/L (ref 3.5–5.1)
Sodium: 137 mmol/L (ref 135–145)
Sodium: 138 mmol/L (ref 135–145)

## 2019-09-11 LAB — CBC
HCT: 27 % — ABNORMAL LOW (ref 39.0–52.0)
Hemoglobin: 8.4 g/dL — ABNORMAL LOW (ref 13.0–17.0)
MCH: 27.5 pg (ref 26.0–34.0)
MCHC: 31.1 g/dL (ref 30.0–36.0)
MCV: 88.5 fL (ref 80.0–100.0)
Platelets: 225 10*3/uL (ref 150–400)
RBC: 3.05 MIL/uL — ABNORMAL LOW (ref 4.22–5.81)
RDW: 23.1 % — ABNORMAL HIGH (ref 11.5–15.5)
WBC: 21.3 10*3/uL — ABNORMAL HIGH (ref 4.0–10.5)
nRBC: 2.2 % — ABNORMAL HIGH (ref 0.0–0.2)

## 2019-09-11 LAB — GLUCOSE, CAPILLARY
Glucose-Capillary: 118 mg/dL — ABNORMAL HIGH (ref 70–99)
Glucose-Capillary: 163 mg/dL — ABNORMAL HIGH (ref 70–99)
Glucose-Capillary: 186 mg/dL — ABNORMAL HIGH (ref 70–99)
Glucose-Capillary: 219 mg/dL — ABNORMAL HIGH (ref 70–99)
Glucose-Capillary: 228 mg/dL — ABNORMAL HIGH (ref 70–99)
Glucose-Capillary: 231 mg/dL — ABNORMAL HIGH (ref 70–99)
Glucose-Capillary: 57 mg/dL — ABNORMAL LOW (ref 70–99)
Glucose-Capillary: 79 mg/dL (ref 70–99)

## 2019-09-11 LAB — PROTIME-INR
INR: 2.3 — ABNORMAL HIGH (ref 0.8–1.2)
Prothrombin Time: 25.3 seconds — ABNORMAL HIGH (ref 11.4–15.2)

## 2019-09-11 LAB — ECHOCARDIOGRAM LIMITED: Weight: 2560.86 oz

## 2019-09-11 LAB — MAGNESIUM: Magnesium: 2.5 mg/dL — ABNORMAL HIGH (ref 1.7–2.4)

## 2019-09-11 LAB — LACTIC ACID, PLASMA: Lactic Acid, Venous: 5.8 mmol/L (ref 0.5–1.9)

## 2019-09-11 MED ORDER — DEXTROSE 50 % IV SOLN
50.0000 mL | Freq: Once | INTRAVENOUS | Status: AC
  Administered 2019-09-11: 50 mL via INTRAVENOUS

## 2019-09-11 MED ORDER — DEXTROSE 50 % IV SOLN: 25.0000 mL | Freq: Once | INTRAVENOUS | Status: DC

## 2019-09-11 MED ORDER — HEPARIN SODIUM (PORCINE) 5000 UNIT/ML IJ SOLN
5000.0000 [IU] | Freq: Three times a day (TID) | INTRAMUSCULAR | Status: DC
  Administered 2019-09-11 – 2019-09-12 (×3): 5000 [IU] via SUBCUTANEOUS
  Filled 2019-09-11 (×3): qty 1

## 2019-09-11 NOTE — Progress Notes (Signed)
Dennis Walker, Dennis Walker 09/11/19  Subjective:   LOS: 2 03/09 0701 - 03/10 0700 In: 2147 [I.V.:1782.7; IV Piggyback:364.3] Out: 2016 [Urine:5; Emesis/NG output:100]  Patient remains critically ill at the moment. Still on the ventilator. CRRT continues to progress well. Still requiring pressors.  Objective:  Vital signs in last 24 hours:  Temp:  [98.1 F (36.7 C)-98.9 F (37.2 C)] 98.9 F (37.2 C) (03/10 1130) Pulse Rate:  [63-75] 64 (03/10 1300) Resp:  [9-23] 16 (03/10 1300) BP: (126-193)/(14-72) 163/72 (03/10 1300) SpO2:  [88 %-100 %] 100 % (03/10 1300) FiO2 (%):  [28 %-30 %] 28 % (03/10 1142) Weight:  [72.2 kg] 72.2 kg (03/10 0423)  Weight change: -2.2 kg Filed Weights   09/23/2019 1300 09/10/19 0436 09/11/19 0423  Weight: 74.4 kg 72.6 kg 72.2 kg    Intake/Output:    Intake/Output Summary (Last 24 hours) at 09/11/2019 1401 Last data filed at 09/11/2019 1300 Gross per 24 hour  Intake 2002.95 ml  Output 1882 ml  Net 120.95 ml     Physical Exam: General:  Critically ill-appearing  HEENT  endotracheal tube in place  Pulm/lungs  bilateral rhonchi, vent assisted  CVS/Heart  S1S2 no rubs  Abdomen:   Soft, nontender  Extremities:  left AKA, right BKA  Neurologic:  Not responding to painful stimuli  Skin:  No acute rashes  Access:  Right arm new AV graft, left arm old AV graft       Basic Metabolic Panel:  Recent Labs  Lab 09/02/2019 1534 09/29/2019 1534 09/06/2019 2057 10/01/2019 2057 09/10/19 0223 09/10/19 0437 09/10/19 1553 09/11/19 0426 09/11/19 0427  NA 136  --  137  --   --  134* 137 137  --   K 5.5*  --  4.6  --   --  4.5 6.0* 5.6*  --   CL 93*  --  93*  --   --  97* 97* 99  --   CO2 24  --  23  --   --  20* 20* 20*  --   GLUCOSE 413*  --  288*  --   --  371* 267* 235*  --   BUN 59*  --  58*  --   --  51* 45* 42*  --   CREATININE 10.02*  --  9.19*  --   --  7.99* 6.79* 5.70*  --   CALCIUM 7.4*   < > 7.4*   < >  --  6.9* 7.4*  7.5*  --   MG  --   --  2.8*  --  2.7*  --   --   --  2.5*  PHOS 7.0*  --  4.3  --   --  3.9 4.7* 5.0*  --    < > = values in this interval not displayed.     CBC: Recent Labs  Lab 09/07/19 0655 10/01/2019 0749 09/07/2019 0924 09/10/19 0223 09/11/19 0427  WBC 12.3* 12.6* 24.3* 19.9* 21.3*  NEUTROABS 8.7* 10.2*  --   --   --   HGB 8.9* 6.1* 9.1* 8.8* 8.4*  HCT 28.4* 22.3* 30.6* 27.8* 27.0*  MCV 86.9 99.6 94.2 86.9 88.5  PLT 292 178 323 240 225      Lab Results  Component Value Date   HEPBSAG Negative 12/26/2017   HEPBSAB Non Reactive 12/26/2017      Microbiology:  Recent Results (from the past 240 hour(s))  SARS CORONAVIRUS 2 (TAT 6-24 HRS) Nasopharyngeal Nasopharyngeal  Swab     Status: None   Collection Time: 09/04/19  8:37 PM   Specimen: Nasopharyngeal Swab  Result Value Ref Range Status   SARS Coronavirus 2 NEGATIVE NEGATIVE Final    Comment: (NOTE) SARS-CoV-2 target nucleic acids are NOT DETECTED. The SARS-CoV-2 RNA is generally detectable in upper and lower respiratory specimens during the acute phase of infection. Negative results do not preclude SARS-CoV-2 infection, do not rule out co-infections with other pathogens, and should not be used as the sole basis for treatment or other patient management decisions. Negative results must be combined with clinical observations, patient history, and epidemiological information. The expected result is Negative. Fact Sheet for Patients: SugarRoll.be Fact Sheet for Healthcare Providers: https://www.woods-mathews.com/ This test is not yet approved or cleared by the [place] FDA and  has been authorized for detection and/or diagnosis of SARS-CoV-2 by FDA under an Emergency Use Authorization (EUA). This EUA will remain  in effect (meaning this test can be used) for the duration of the COVID-19 declaration under Section 56 4(b)(1) of the Act, 21 U.S.C. section 360bbb-3(b)(1),  unless the authorization is terminated or revoked sooner. Performed at [facility], [place] [address] [place], [place] [address]   Blood culture (routine x 2)     Status: None (Preliminary result)   Collection Time: 09/23/2019  7:50 AM   Specimen: BLOOD  Result Value Ref Range Status   Specimen Description BLOOD RIGHT HAND  Final   Special Requests   Final    BOTTLES DRAWN AEROBIC AND ANAEROBIC Blood Culture results may not be optimal due to an inadequate volume of blood received in culture bottles   Culture   Final    NO GROWTH 2 DAYS Performed at [facility], [address] [place], [place] [address]    Report Status PENDING  Incomplete  Urine Culture     Status: None   Collection Time: 09/08/2019  8:03 AM   Specimen: Urine, Catheterized  Result Value Ref Range Status   Specimen Description   Final    URINE, CATHETERIZED Performed at [facility], [address] [place], [place] [address]    Special Requests   Final    Immunocompromised Performed at [facility], [address] [place], [place] [address]    Culture   Final    NO GROWTH Performed at [facility], [place] [address], [place], [place] [address]    Report Status 09/10/2019 FINAL  Final  Blood culture (routine x 2)     Status: None (Preliminary result)   Collection Time: 09/16/2019 11:46 AM   Specimen: BLOOD  Result Value Ref Range Status   Specimen Description BLOOD BLOOD RIGHT HAND  Final   Special Requests   Final    BOTTLES DRAWN AEROBIC AND ANAEROBIC Blood Culture adequate volume   Culture   Final    NO GROWTH 2 DAYS Performed at [facility], [address] [place], [place] [address]    Report Status PENDING  Incomplete  MRSA PCR Screening     Status: None   Collection Time: 09/07/2019  1:19 PM   Specimen: Nasopharyngeal  Result Value Ref Range Status   MRSA by PCR NEGATIVE NEGATIVE Final    Comment:        The GeneXpert MRSA Assay (FDA approved for  NASAL specimens only), is one component of a comprehensive MRSA colonization surveillance program. It is not intended to diagnose MRSA infection nor to guide or  monitor treatment for MRSA infections. Performed at [facility], [place] [place], [place] [address]     Coagulation Studies: Recent Labs    09/22/2019 4656 09/10/19 0437 09/10/19 1005 09/11/19 0427  LABPROT 67.5* 51.6* 38.9* 25.3*  INR 8.0* 5.7* 4.0* 2.3*    Urinalysis: Recent Labs    09/07/2019 0803  COLORURINE AMBER*  LABSPEC 1.020  PHURINE 5.0  GLUCOSEU 50*  HGBUR MODERATE*  BILIRUBINUR NEGATIVE  KETONESUR NEGATIVE  PROTEINUR >=300*  NITRITE NEGATIVE  LEUKOCYTESUR LARGE*      Imaging: DG Chest Port 1 View  Result Date: 09/10/2019 CLINICAL DATA:  Interval change EXAM: PORTABLE CHEST 1 VIEW COMPARISON:  Yesterday FINDINGS: Endotracheal tube tip is at the clavicular heads. The enteric tube and side-port reaches the stomach. Cardiomegaly. Hazy opacity of the lower chest without definite change given differences in projection. No visible pneumothorax. Left IJ line with tip at the upper cavoatrial junction. Left subclavian stenting. IMPRESSION: 1. Hazy opacity at the bases, usually atelectasis and pleural fluid. 2. Stable hardware positioning. Electronically Signed   By: Monte Fantasia M.D.   On: 09/10/2019 05:22   ECHOCARDIOGRAM LIMITED  Result Date: 09/11/2019    ECHOCARDIOGRAM LIMITED REPORT   Patient Name:   Dennis Walker Date of Exam: 09/10/2019 Medical Rec #:  [id number]    Height:       71.0 in Accession #:    [id number]   Weight:       160.1 lb Date of Birth:  1950/10/29    BSA:          1.918 m Patient Age:    49 years     BP:           174/36 mmHg Patient Gender: M            HR:           73 bpm. Exam Location:  [facility] Procedure: 2D Echo, Limited Echo, Limited Color Doppler and Cardiac Doppler Indications:     410 Acute Myocardial Infarction  History:         Patient has prior history of  Echocardiogram examinations, most                  recent 09/03/2019. Risk Factors:Hypertension, Diabetes and                  Dyslipidemia. Stroke. Peripheral vascular disease. Myocardial                  infarction. Murmur. End stage renal disease. Dyspnea. Coronary                  artery disease. Congestive heart failure.  Sonographer:     Wilford Sports Rodgers-Jones Referring Phys:  [id number] CHRISTOPHER END Diagnosing Phys: Nelva Bush MD IMPRESSIONS  1. No obvious vegetation is seen, though native valvular disease is present.  2. Left ventricular ejection fraction, by estimation, is 55 to 60%. The left ventricle has normal function. The left ventricle has no regional wall motion abnormalities. Left ventricular diastolic parameters are consistent with Grade II diastolic dysfunction (pseudonormalization). Elevated left atrial pressure.  3. There is mild to moderate pulmonary hypertension (35 mmHg plus central venous pressure). Right ventricular systolic function is moderately reduced. The right ventricular size is moderately enlarged.  4. Left atrial size was mildly dilated.  5. Right atrial size was mild to moderately dilated.  6. The mitral valve is degenerative. Trivial mitral valve regurgitation. No evidence of mitral  stenosis.  7. The aortic valve is tricuspid. Aortic valve regurgitation is not visualized. Mild to moderate aortic valve sclerosis/calcification is present, without any evidence of aortic stenosis. FINDINGS  Left Ventricle: Left ventricular ejection fraction, by estimation, is 55 to 60%. The left ventricle has normal function. The left ventricle has no regional wall motion abnormalities. The left ventricular internal cavity size was normal in size. There is  borderline left ventricular hypertrophy. Elevated left atrial pressure. Right Ventricle: There is mild to moderate pulmonary hypertension (35 mmHg plus central venous pressure). The right ventricular size is moderately enlarged. No increase in  right ventricular wall thickness. Right ventricular systolic function is moderately reduced. Left Atrium: Left atrial size was mildly dilated. Right Atrium: Right atrial size was mild to moderately dilated. Pericardium: There is no evidence of pericardial effusion. Mitral Valve: The mitral valve is degenerative in appearance. There is moderate thickening of the mitral valve leaflet(s). Trivial mitral valve regurgitation. No evidence of mitral valve stenosis. Tricuspid Valve: The tricuspid valve is normal in structure. Tricuspid valve regurgitation is mild. Aortic Valve: The aortic valve is tricuspid. . There is severe thickening and moderate calcification of the aortic valve. Aortic valve regurgitation is not visualized. Mild to moderate aortic valve sclerosis/calcification is present, without any evidence  of aortic stenosis. There is severe thickening of the aortic valve. There is moderate calcification of the aortic valve. Pulmonic Valve: The pulmonic valve was grossly normal. Pulmonic valve regurgitation is trivial. No evidence of pulmonic stenosis. Aorta: The aortic root is normal in size and structure. Pulmonary Artery: The pulmonary artery is not well seen. Venous: IVC assessment for right atrial pressure unable to be performed due to mechanical ventilation. Additional Comments: There is pleural effusion in the left lateral region.  LEFT VENTRICLE PLAX 2D LVIDd:         3.87 cm  Diastology LVIDs:         2.58 cm  LV e' lateral:   8.16 cm/s LV PW:         0.93 cm  LV E/e' lateral: 12.3 LV IVS:        1.02 cm  LV e' medial:    4.24 cm/s LVOT diam:     2.00 cm  LV E/e' medial:  23.6 LV SV:         52 LV SV Index:   27 LVOT Area:     3.14 cm  RIGHT VENTRICLE RV Basal diam:  4.99 cm RV S prime:     6.31 cm/s LEFT ATRIUM             Index       RIGHT ATRIUM           Index LA diam:        4.90 cm 2.55 cm/m  RA Area:     25.80 cm LA Vol (A2C):   48.2 ml 25.13 ml/m RA Volume:   102.00 ml 53.18 ml/m LA Vol (A4C):    65.0 ml 33.89 ml/m LA Biplane Vol: 61.0 ml 31.80 ml/m  AORTIC VALVE LVOT Vmax:   94.20 cm/s LVOT Vmean:  60.050 cm/s LVOT VTI:    0.166 m  AORTA Ao Root diam: 3.10 cm MITRAL VALVE                TRICUSPID VALVE MV Area (PHT): 2.69 cm     TR Peak grad:   34.3 mmHg MV Decel Time: 282 msec     TR Vmax:  293.00 cm/s MV E velocity: 100.00 cm/s MV A velocity: 102.00 cm/s  SHUNTS MV E/A ratio:  0.98         Systemic VTI:  0.17 m                             Systemic Diam: 2.00 cm Nelva Bush MD Electronically signed by Nelva Bush MD Signature Date/Time: 09/11/2019/7:14:22 AM    Final      Medications:   .  prismasol BGK 4/2.5 200 mL/hr at 09/22/2019 1606  .  prismasol BGK 4/2.5 200 mL/hr at 09/23/2019 1606  . sodium chloride    . fentaNYL infusion INTRAVENOUS 300 mcg/hr (09/11/19 1300)  . phenylephrine (NEO-SYNEPHRINE) Adult infusion 150 mcg/min (09/11/19 1343)  . piperacillin-tazobactam (ZOSYN)  IV Stopped (09/11/19 0721)  . prismasol BGK 4/2.5 500 mL/hr at 09/10/19 0222  . vancomycin Stopped (09/10/19 1902)  . vasopressin (PITRESSIN) infusion - *FOR SHOCK* 0.03 Units/min (09/11/19 1300)   . artificial tears   Both Eyes Q8H  . chlorhexidine gluconate (MEDLINE KIT)  15 mL Mouth Rinse BID  . Chlorhexidine Gluconate Cloth  6 each Topical Q0600  . epoetin (EPOGEN/PROCRIT) injection  20,000 Units Subcutaneous Weekly  . heparin injection (subcutaneous)  5,000 Units Subcutaneous Q8H  . hydrocortisone sod succinate (SOLU-CORTEF) inj  50 mg Intravenous Q6H  . influenza vaccine adjuvanted  0.5 mL Intramuscular Tomorrow-1000  . insulin aspart  0-6 Units Subcutaneous Q4H  . mouth rinse  15 mL Mouth Rinse 10 times per day  . pantoprazole (PROTONIX) IV  40 mg Intravenous QHS  . pneumococcal 23 valent vaccine  0.5 mL Intramuscular Tomorrow-1000  . sodium chloride flush  3 mL Intravenous Q12H   sodium chloride, acetaminophen, artificial tears, fentaNYL (SUBLIMAZE) injection, heparin, midazolam,  ondansetron (ZOFRAN) IV, polyvinyl alcohol, sodium chloride flush  Assessment/ Plan:  69 y.o. male with end-stage renal disease on hemodialysis, hypertension, history of polio in childhood, hyperlipidemia, stroke with left-sided residual weakness, peripheral vascular disease-history of right BKA, new left AKA, coronary disease status post PCI to proximal LAD, type 2 diabetes, diabetic neuropathy, congestive heart failure, asthma  Active Problems:   Septic shock ([place])   Respiratory failure ([place])   Pressure injury of skin  [facility] TTS// [facility]// rt arm AVG  #. ESRD Too unstable for intermittent hemodialysis at the moment.  Continue CRRT using 2K bath as potassium was 5.6 earlier today.  #. Anemia of CKD  Lab Results  Component Value Date   HGB 8.4 (L) 09/11/2019   Continue Epogen 20,000 units subcutaneous weekly.  #. SHPTH  No results found for: PTH Lab Results  Component Value Date   PHOS 5.0 (H) 09/11/2019   Phosphorus currently acceptable at 5.0.  Continue to monitor.  #. Diabetes type 2 with CKD with neuropathy, peripheral vascular disease Hgb A1c MFr Bld (%)  Date Value  08/29/2019 7.8 (H)  Glycemic control per CC.   #Peripheral vascular disease History of right BKA Gangrene of left foot, nonsalvageable Left AKA on August 29, 2018  #Acute respiratory failure. Patient still ventilator dependent.  Weaning as per critical care.      LOS: 2 Dennis Walker 3/10/20212:01 PM  [place] [facility], [place]

## 2019-09-11 NOTE — Progress Notes (Signed)
[facility] Daily Progress Note   Subjective: 08/31/19:Left above-the-knee amputation (Dr. Lorenso Courier)  On vent, sedated, on multiple pressors, on CRRT.  Objective: Vitals:   09/11/19 1000 09/11/19 1030 09/11/19 1130 09/11/19 1142  BP: (!) 157/41 (!) 167/61    Pulse: 65 65 73   Resp: 16 14 14    Temp:   98.9 F (37.2 C)   TempSrc:   Oral   SpO2: 99% 100% 96% 96%  Weight:        Intake/Output Summary (Last 24 hours) at 09/11/2019 1227 Last data filed at 09/11/2019 1000 Gross per 24 hour  Intake 1943.45 ml  Output 1757 ml  Net 186.45 ml   Physical Exam: Sedated normally responsive CV: RRR Pulmonary: On vent.  Diminished bilaterally. Abdomen: Soft, Nontender, Nondistended Vascular:  Right upper extremity:   New access site: Without any signs of infection noted.  Left upper extremity:   Current access site: Without any signs of infection noted.  Left lower extremity:    AKA stump: Dressing with minimal drainage.  Thigh is soft. Without any signs of infection noted.   Laboratory: CBC    Component Value Date/Time   WBC 21.3 (H) 09/11/2019 0427   HGB 8.4 (L) 09/11/2019 0427   HCT 27.0 (L) 09/11/2019 0427   PLT 225 09/11/2019 0427   BMET    Component Value Date/Time   NA 137 09/11/2019 0426   K 5.6 (H) 09/11/2019 0426   CL 99 09/11/2019 0426   CO2 20 (L) 09/11/2019 0426   GLUCOSE 235 (H) 09/11/2019 0426   BUN 42 (H) 09/11/2019 0426   CREATININE 5.70 (H) 09/11/2019 0426   CALCIUM 7.5 (L) 09/11/2019 0426   GFRNONAA 9 (L) 09/11/2019 0426   GFRAA 11 (L) 09/11/2019 0426   Assessment/Planning: The patient is a 69 year old male with multiple medical issues including end-stage renal disease with a left upper extremity dialysis graft for access and a recently created left above-the-knee amputation (08/31/19)  1) physical exam without any signs of infection at the old/new dialysis access sites as well as his newly created AKA. 2) if medically appropriate  / stable enough might consider a tagged white blood cell count scan  Discussed with Dr. Ellis Parents [facility] PA-C 09/11/2019 12:27 PM

## 2019-09-11 NOTE — Progress Notes (Signed)
CRITICAL CARE NOTE  69 y.o.malethe history of ESRD on HD, diabetes, recent AKA of the left lower extremity, hypertension, hyperlipidemia, peripheral vascular disease on Plavix who presents from his nursing home for unresponsiveness.  Presented with- -His heart rate in the 30s. low glucose levels -An IO was placed in the field. Patient arrives in the emergency room with a GCS of 3, pulse in the 40s, repeat blood glucose in the 60s, and BP in the 60s. Patient had left and gaze deviation but no other signs of seizure.According to EMS patient has missed his last 2 dialysis treatment.    SIGNIFICANT EVENTS 3/8 admitted for multiorgan failure and septic shock 3/9 remains intubated, on vent, multiple vasopressors 3/10 remains on vent, multiorgan failure, remains DNR, on CRRT Multiple vasopressors   CC  follow up respiratory failure  SUBJECTIVE Patient remains critically ill Prognosis is guarded High risk for cardiac arrest  CBC    Component Value Date/Time   WBC 21.3 (H) 09/11/2019 0427   RBC 3.05 (L) 09/11/2019 0427   HGB 8.4 (L) 09/11/2019 0427   HCT 27.0 (L) 09/11/2019 0427   PLT 225 09/11/2019 0427   MCV 88.5 09/11/2019 0427   MCH 27.5 09/11/2019 0427   MCHC 31.1 09/11/2019 0427   RDW 23.1 (H) 09/11/2019 0427   LYMPHSABS 1.1 09/19/2019 0749   MONOABS 0.9 09/07/2019 0749   EOSABS 0.0 09/02/2019 0749   BASOSABS 0.0 09/29/2019 0749   BMP Latest Ref Rng & Units 09/11/2019 09/10/2019 09/10/2019  Glucose 70 - 99 mg/dL 235(H) 267(H) 371(H)  BUN 8 - 23 mg/dL 42(H) 45(H) 51(H)  Creatinine 0.61 - 1.24 mg/dL 5.70(H) 6.79(H) 7.99(H)  Sodium 135 - 145 mmol/L 137 137 134(L)  Potassium 3.5 - 5.1 mmol/L 5.6(H) 6.0(H) 4.5  Chloride 98 - 111 mmol/L 99 97(L) 97(L)  CO2 22 - 32 mmol/L 20(L) 20(L) 20(L)  Calcium 8.9 - 10.3 mg/dL 7.5(L) 7.4(L) 6.9(L)    BP (!) 171/36 (BP Location: Right Leg)   Pulse 63   Temp 98.9 F (37.2 C) (Axillary)   Resp 16   Wt 72.2 kg   SpO2 99%    BMI 22.20 kg/m    I/O last 3 completed shifts: In: 3293.1 [I.V.:2817.3; IV Piggyback:475.8] Out: 3090 [Urine:5; Emesis/NG output:150; Other:2935] No intake/output data recorded.  SpO2: 99 % FiO2 (%): 28 %  Estimated body mass index is 22.2 kg/m as calculated from the following:   Height as of 09/07/19: 5\' 11"  (1.803 m).   Weight as of this encounter: 72.2 kg.   REVIEW OF SYSTEMS  PATIENT IS UNABLE TO PROVIDE COMPLETE REVIEW OF SYSTEMS DUE TO SEVERE CRITICAL ILLNESS   Pressure Injury 09/10/19 Right Stage 2 -  Partial thickness loss of dermis presenting as a shallow open injury with a red, pink wound bed without slough. (Active)  09/10/19 1000  Location:   Location Orientation: Right  Staging: Stage 2 -  Partial thickness loss of dermis presenting as a shallow open injury with a red, pink wound bed without slough.  Wound Description (Comments):   Present on Admission:      Pressure Injury 09/10/19 Buttocks Left Stage 2 -  Partial thickness loss of dermis presenting as a shallow open injury with a red, pink wound bed without slough. (Active)  09/10/19 1000  Location: Buttocks  Location Orientation: Left  Staging: Stage 2 -  Partial thickness loss of dermis presenting as a shallow open injury with a red, pink wound bed without slough.  Wound Description (Comments):  Present on Admission:       PHYSICAL EXAMINATION:  GENERAL:critically ill appearing, +resp distress HEAD: Normocephalic, atraumatic.  EYES: BLIND MOUTH: Moist mucosal membrane. NECK: Supple.  PULMONARY: +rhonchi, +wheezing CARDIOVASCULAR: S1 and S2. Regular rate and rhythm. No murmurs, rubs, or gallops.  GASTROINTESTINAL: Soft, nontender, -distended.  Positive bowel sounds.   MUSCULOSKELETAL: no legs NEUROLOGIC: obtunded, GCS<8 SKIN:intact,warm,dry  MEDICATIONS: I have reviewed all medications and confirmed regimen as documented   CULTURE RESULTS   Recent Results (from the past 240 hour(s))  SARS  CORONAVIRUS 2 (TAT 6-24 HRS) Nasopharyngeal Nasopharyngeal Swab     Status: None   Collection Time: 09/04/19  8:37 PM   Specimen: Nasopharyngeal Swab  Result Value Ref Range Status   SARS Coronavirus 2 NEGATIVE NEGATIVE Final    Comment: (NOTE) SARS-CoV-2 target nucleic acids are NOT DETECTED. The SARS-CoV-2 RNA is generally detectable in upper and lower respiratory specimens during the acute phase of infection. Negative results do not preclude SARS-CoV-2 infection, do not rule out co-infections with other pathogens, and should not be used as the sole basis for treatment or other patient management decisions. Negative results must be combined with clinical observations, patient history, and epidemiological information. The expected result is Negative. Fact Sheet for Patients: SugarRoll.be Fact Sheet for Healthcare Providers: https://www.woods-mathews.com/ This test is not yet approved or cleared by the [place] FDA and  has been authorized for detection and/or diagnosis of SARS-CoV-2 by FDA under an Emergency Use Authorization (EUA). This EUA will remain  in effect (meaning this test can be used) for the duration of the COVID-19 declaration under Section 56 4(b)(1) of the Act, 21 U.S.C. section 360bbb-3(b)(1), unless the authorization is terminated or revoked sooner. Performed at [facility], [place] [address] [place], [place] [address]   Blood culture (routine x 2)     Status: None (Preliminary result)   Collection Time: 09/03/2019  7:50 AM   Specimen: BLOOD  Result Value Ref Range Status   Specimen Description BLOOD RIGHT HAND  Final   Special Requests   Final    BOTTLES DRAWN AEROBIC AND ANAEROBIC Blood Culture results may not be optimal due to an inadequate volume of blood received in culture bottles   Culture   Final    NO GROWTH < 24 HOURS Performed at [facility], [address] [place], [place] [address]     Report Status PENDING  Incomplete  Urine Culture     Status: None   Collection Time: 09/08/2019  8:03 AM   Specimen: Urine, Catheterized  Result Value Ref Range Status   Specimen Description   Final    URINE, CATHETERIZED Performed at [facility], [address], [place], [place] [address]    Special Requests   Final    Immunocompromised Performed at [facility], [address] [place], [place] [address]    Culture   Final    NO GROWTH Performed at [facility], [place] [address] [place], [place] [address]    Report Status 09/10/2019 FINAL  Final  Blood culture (routine x 2)     Status: None (Preliminary result)   Collection Time: 09/17/2019 11:46 AM   Specimen: BLOOD  Result Value Ref Range Status   Specimen Description BLOOD BLOOD RIGHT HAND  Final   Special Requests   Final    BOTTLES DRAWN AEROBIC AND ANAEROBIC Blood Culture adequate volume   Culture   Final    NO GROWTH < 24  HOURS Performed at [facility], [address] [place], [place] [address]    Report Status PENDING  Incomplete  MRSA PCR Screening     Status: None   Collection Time: 09/26/2019  1:19 PM   Specimen: Nasopharyngeal  Result Value Ref Range Status   MRSA by PCR NEGATIVE NEGATIVE Final    Comment:        The GeneXpert MRSA Assay (FDA approved for NASAL specimens only), is one component of a comprehensive MRSA colonization surveillance program. It is not intended to diagnose MRSA infection nor to guide or monitor treatment for MRSA infections. Performed at [facility], [address] [place], [place] [address]           IMAGING    ECHOCARDIOGRAM LIMITED  Result Date: 09/11/2019    ECHOCARDIOGRAM LIMITED REPORT   Patient Name:   JEREMIE GIANGRANDE Date of Exam: 09/10/2019 Medical Rec #:  [id number]    Height:       71.0 in Accession #:    [id number]   Weight:       160.1 lb Date of Birth:  Jul 27, 1950    BSA:          1.918 m Patient Age:    48 years      BP:           174/36 mmHg Patient Gender: M            HR:           73 bpm. Exam Location:  [facility] Procedure: 2D Echo, Limited Echo, Limited Color Doppler and Cardiac Doppler Indications:     410 Acute Myocardial Infarction  History:         Patient has prior history of Echocardiogram examinations, most                  recent 09/03/2019. Risk Factors:Hypertension, Diabetes and                  Dyslipidemia. Stroke. Peripheral vascular disease. Myocardial                  infarction. Murmur. End stage renal disease. Dyspnea. Coronary                  artery disease. Congestive heart failure.  Sonographer:     Wilford Sports Rodgers-Jones Referring Phys:  [id number] CHRISTOPHER END Diagnosing Phys: Nelva Bush MD IMPRESSIONS  1. No obvious vegetation is seen, though native valvular disease is present.  2. Left ventricular ejection fraction, by estimation, is 55 to 60%. The left ventricle has normal function. The left ventricle has no regional wall motion abnormalities. Left ventricular diastolic parameters are consistent with Grade II diastolic dysfunction (pseudonormalization). Elevated left atrial pressure.  3. There is mild to moderate pulmonary hypertension (35 mmHg plus central venous pressure). Right ventricular systolic function is moderately reduced. The right ventricular size is moderately enlarged.  4. Left atrial size was mildly dilated.  5. Right atrial size was mild to moderately dilated.  6. The mitral valve is degenerative. Trivial mitral valve regurgitation. No evidence of mitral stenosis.  7. The aortic valve is tricuspid. Aortic valve regurgitation is not visualized. Mild to moderate aortic valve sclerosis/calcification is present, without any evidence of aortic stenosis. FINDINGS  Left Ventricle: Left ventricular ejection fraction, by estimation, is 55 to 60%. The left ventricle has normal function. The left ventricle has no regional wall motion abnormalities. The left ventricular internal cavity size was  normal in size. There is  borderline left ventricular hypertrophy. Elevated left atrial pressure. Right Ventricle: There is mild to moderate pulmonary hypertension (35 mmHg plus central venous pressure). The right ventricular size is moderately enlarged. No increase in right ventricular wall thickness. Right ventricular systolic function is moderately reduced. Left Atrium: Left atrial size was mildly dilated. Right Atrium: Right atrial size was mild to moderately dilated. Pericardium: There is no evidence of pericardial effusion. Mitral Valve: The mitral valve is degenerative in appearance. There is moderate thickening of the mitral valve leaflet(s). Trivial mitral valve regurgitation. No evidence of mitral valve stenosis. Tricuspid Valve: The tricuspid valve is normal in structure. Tricuspid valve regurgitation is mild. Aortic Valve: The aortic valve is tricuspid. . There is severe thickening and moderate calcification of the aortic valve. Aortic valve regurgitation is not visualized. Mild to moderate aortic valve sclerosis/calcification is present, without any evidence  of aortic stenosis. There is severe thickening of the aortic valve. There is moderate calcification of the aortic valve. Pulmonic Valve: The pulmonic valve was grossly normal. Pulmonic valve regurgitation is trivial. No evidence of pulmonic stenosis. Aorta: The aortic root is normal in size and structure. Pulmonary Artery: The pulmonary artery is not well seen. Venous: IVC assessment for right atrial pressure unable to be performed due to mechanical ventilation. Additional Comments: There is pleural effusion in the left lateral region.  LEFT VENTRICLE PLAX 2D LVIDd:         3.87 cm  Diastology LVIDs:         2.58 cm  LV e' lateral:   8.16 cm/s LV PW:         0.93 cm  LV E/e' lateral: 12.3 LV IVS:        1.02 cm  LV e' medial:    4.24 cm/s LVOT diam:     2.00 cm  LV E/e' medial:  23.6 LV SV:         52 LV SV Index:   27 LVOT Area:     3.14 cm   RIGHT VENTRICLE RV Basal diam:  4.99 cm RV S prime:     6.31 cm/s LEFT ATRIUM             Index       RIGHT ATRIUM           Index LA diam:        4.90 cm 2.55 cm/m  RA Area:     25.80 cm LA Vol (A2C):   48.2 ml 25.13 ml/m RA Volume:   102.00 ml 53.18 ml/m LA Vol (A4C):   65.0 ml 33.89 ml/m LA Biplane Vol: 61.0 ml 31.80 ml/m  AORTIC VALVE LVOT Vmax:   94.20 cm/s LVOT Vmean:  60.050 cm/s LVOT VTI:    0.166 m  AORTA Ao Root diam: 3.10 cm MITRAL VALVE                TRICUSPID VALVE MV Area (PHT): 2.69 cm     TR Peak grad:   34.3 mmHg MV Decel Time: 282 msec     TR Vmax:        293.00 cm/s MV E velocity: 100.00 cm/s MV A velocity: 102.00 cm/s  SHUNTS MV E/A ratio:  0.98         Systemic VTI:  0.17 m                             Systemic  Diam: 2.00 cm Nelva Bush MD Electronically signed by Nelva Bush MD Signature Date/Time: 09/11/2019/7:14:22 AM    Final      Nutrition Status: Nutrition Problem: Inadequate oral intake Etiology: inability to eat Signs/Symptoms: NPO status Interventions: Tube feeding, Prostat, MVI     Indwelling Urinary Catheter continued, requirement due to   Reason to continue Indwelling Urinary Catheter strict Intake/Output monitoring for hemodynamic instability   Central Line/ continued, requirement due to  Reason to continue [place] of central venous pressure or other hemodynamic parameters and poor IV access   Ventilator continued, requirement due to severe respiratory failure   Ventilator Sedation RASS 0 to -2      ASSESSMENT AND PLAN SYNOPSIS 69 yo AAM with multiple medical issues with ESRD on HD with Poor protoplasm and poor vasculature with acute DIASTOLIC HEART FAILURE with acute and severe metabolic acidosis with severe toxic metabolic encephalopathy with multiorgan failure in septic shock  Severe ACUTE Hypoxic and Hypercapnic Respiratory Failure due to acute septic shock previous dx of staph species -continue Full MV  support -continue Bronchodilator Therapy -Wean Fio2 and PEEP as tolerated   ACUTE DIASTOLIC CARDIAC FAILURE-due to demand ischemia Vent and vasopressor support    KIDNEY INJURY/Renal Failure -follow chem 7 -follow UO No foley needed On CRRT   NEUROLOGY Acute toxic metabolic encephalopathy due  sepsis  Acute SHOCK-SEPSIS/HYPOVOLUMIC/CARDIOGENIC due to septic shock and diastolic heart failure -use vasopressors to keep MAP>65 -follow ABG and LA -follow up cultures -emperic ABX stress dose steroids   CARDIAC ICU monitoring  ID -continue IV abx as prescibed -follow up cultures Follow up ID recs Follow up Vasc surgery recs Will need to consider TEE  GI GI PROPHYLAXIS as indicated  NUTRITIONAL STATUS  DIET-->TF's as tolerated Constipation protocol as indicated  ENDO - will use ICU hypoglycemic\Hyperglycemia protocol if indicated   ELECTROLYTES -follow labs as needed -replace as needed -pharmacy consultation and following   DVT/GI PRX ordered TRANSFUSIONS AS NEEDED MONITOR FSBS ASSESS the need for LABS as needed   Critical Care Time devoted to patient care services described in this note is 39 minutes.   Overall, patient is critically ill, prognosis is guarded.  Patient with Multiorgan failure and at high risk for cardiac arrest and death.   Patient is DNR status   Corrin Parker, M.D.  [facility]  Medical Director [place] Director [facility] Cardio-Pulmonary Department

## 2019-09-11 NOTE — Progress Notes (Signed)
CSW acknowledges SNF consult. Patient is a long-term resident at [facility]. Patient currently on ventilator. Will follow for disposition needs.  Oleh Genin, [place]

## 2019-09-11 NOTE — Progress Notes (Addendum)
Daily Progress Note   Patient Name: Dennis Walker       Date: 09/11/2019 DOB: [birth year]  Age: 69 y.o. MRN#: [id number] Attending Physician: Flora Lipps, MD Primary Care Physician: Juluis Pitch, MD Admit Date: 09/03/2019  Reason for Consultation/Follow-up: Establishing goals of care  Subjective: Patient is resting in bed on ventilator and CRRT. Vasopressin and phenylephrine in place. Left eye is open. Artificial tears/ointment at bedside. No family at bedside.  Called to speak with daughter. She states CCM is discussing Dennis Walker, and she has a meeting with CCM planned. She states she has been kept well updated. Will shadow for needs. Please call for urgent needs.    Length of Stay: 2  Current Medications: Scheduled Meds:  . artificial tears   Both Eyes Q8H  . chlorhexidine gluconate (MEDLINE KIT)  15 mL Mouth Rinse BID  . Chlorhexidine Gluconate Cloth  6 each Topical Q0600  . epoetin (EPOGEN/PROCRIT) injection  20,000 Units Subcutaneous Weekly  . heparin injection (subcutaneous)  5,000 Units Subcutaneous Q8H  . hydrocortisone sod succinate (SOLU-CORTEF) inj  50 mg Intravenous Q6H  . influenza vaccine adjuvanted  0.5 mL Intramuscular Tomorrow-1000  . insulin aspart  0-6 Units Subcutaneous Q4H  . mouth rinse  15 mL Mouth Rinse 10 times per day  . pantoprazole (PROTONIX) IV  40 mg Intravenous QHS  . pneumococcal 23 valent vaccine  0.5 mL Intramuscular Tomorrow-1000  . sodium chloride flush  3 mL Intravenous Q12H    Continuous Infusions: .  prismasol BGK 4/2.5 200 mL/hr at 09/14/2019 1606  .  prismasol BGK 4/2.5 200 mL/hr at 09/02/2019 1606  . sodium chloride    . fentaNYL infusion INTRAVENOUS 300 mcg/hr (09/11/19 1400)  . phenylephrine (NEO-SYNEPHRINE) Adult infusion 150 mcg/min (09/11/19  1400)  . piperacillin-tazobactam (ZOSYN)  IV 3.375 g (09/11/19 1415)  . prismasol BGK 4/2.5 500 mL/hr at 09/10/19 0222  . vancomycin Stopped (09/10/19 1902)  . vasopressin (PITRESSIN) infusion - *FOR SHOCK* 0.03 Units/min (09/11/19 1400)    PRN Meds: sodium chloride, acetaminophen, artificial tears, fentaNYL (SUBLIMAZE) injection, heparin, midazolam, ondansetron (ZOFRAN) IV, polyvinyl alcohol, sodium chloride flush  Physical Exam Constitutional:      Comments: On vent  Neurological:     Comments: Left eye open.  Vital Signs: BP (!) 160/63   Pulse 64   Temp 98.9 F (37.2 C) (Oral)   Resp 16   Wt 72.2 kg   SpO2 100%   BMI 22.20 kg/m  SpO2: SpO2: 100 % O2 Device: O2 Device: Ventilator O2 Flow Rate:    Intake/output summary:   Intake/Output Summary (Last 24 hours) at 09/11/2019 1430 Last data filed at 09/11/2019 1400 Gross per 24 hour  Intake 2066.65 ml  Output 1947 ml  Net 119.65 ml   LBM: Last BM Date: 09/10/19 Baseline Weight: Weight: 74.4 kg Most recent weight: Weight: 72.2 kg       Palliative Assessment/Data:      Patient Active Problem List   Diagnosis Date Noted  . Pressure injury of skin 09/10/2019  . Respiratory failure ([place]) 09/29/2019  . Gangrene of left foot ([place])   . Septic shock ([place])   . Advanced care planning/counseling discussion   . Goals of care, counseling/discussion   . Palliative care by specialist   . Severe sepsis ([place]) 08/29/2019  . Gangrenous toe ([place]) 08/28/2019  . Hypotension 08/28/2019  . PVD (peripheral vascular disease) ([place]) 08/28/2019  . Hx of BKA, right ([place]) 08/28/2019  . Anemia in CKD (chronic kidney disease) 06/26/2019  . 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 Dialysis AV fistula malfunction ([place]) 06/26/2019  . Nephrotic range proteinuria 06/26/2019  . Encounter for orthopedic follow-up care 05/20/2019  . Atherosclerosis of native arteries of the extremities with ulceration ([place]) 11/12/2018  . Coronary artery  disease involving native coronary artery of native heart without angina pectoris 08/18/2018  . Hyperlipidemia LDL goal <70 08/18/2018  . Preprocedural cardiovascular examination 08/18/2018  . MRSA bacteremia   . Infection of AV graft for dialysis ([place])   . Bacteremia 02/16/2018  . 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 Chest pain, mid sternal 01/03/2018  . Acute on chronic respiratory failure with hypoxia ([place]) 12/24/2017  . Pain in right hand 08/25/2017  . Elevated troponin 08/05/2017  . 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 Complication from renal dialysis device 02/20/2017  . ESRD (end stage renal disease) ([place]) 02/12/2017  . Altered mental status 02/02/2017  . Atherosclerosis of native arteries of extremity with intermittent claudication ([place]) 12/20/2016  . Ulcer of amputation stump of lower extremity ([place]) 12/20/2016  . Diabetes ([place]) 12/20/2016  . Hypertension 12/20/2016  . Pure hypercholesterolemia 12/20/2016  . Bleeding 07/29/2016  . Panic attack 01/21/2016  . Chronic systolic heart failure ([place]) 10/27/2015  . Type 2 diabetes mellitus with diabetic nephropathy, with long-term current use of insulin ([place]) 04/06/2015  . Overweight (BMI 25.0-29.9) 12/04/2014  . Vitamin D deficiency 03/17/2014  . TIA (transient ischemic attack) 01/04/2014  . Vision abnormalities 01/03/2014  . Chronic hepatitis C virus infection ([place]) 11/01/2013  . Arteriovenous fistula ([place]) 08/27/2013  . CHF exacerbation ([place]) 12/12/2012  . Constipation 02/01/2012  . Ischemic cardiomyopathy 11/17/2010  . Retinopathy, diabetic, background ([place]) 11/11/2008  . Carotid artery stenosis 11/03/2008  . Cerebral infarct ([place]) 10/31/2008  . 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 Amputation of leg ([place]) 02/02/2004  . Poliomyelitis 07/04/1954    Palliative Care Assessment & Plan    Recommendations/Plan:  Continue current treatment. Palliative will continue to shadow, please call for urgent needs.    Code  Status:    Code Status Orders  (From admission, onward)         Start     Ordered   09/15/2019 1552  Do not attempt resuscitation (DNR)  Continuous  Question Answer Comment  In the event of cardiac or respiratory ARREST Do not call a "code blue"   In the event of cardiac or respiratory ARREST Do not perform Intubation, CPR, defibrillation or ACLS   In the event of cardiac or respiratory ARREST Use medication by any route, position, wound care, and other measures to relive pain and suffering. May use oxygen, suction and manual treatment of airway obstruction as needed for comfort.      09/21/2019 1552        Code Status History    Date Active Date Inactive Code Status Order ID Comments User Context   09/05/2019 0906 09/02/2019 1552 Full Code [id number]  Flora Lipps, MD ED   08/28/2019 2056 09/05/2019 2153 Full Code [id number]  Dennis Desanctis, DO ED   12/04/2018 1347 12/04/2018 1826 Full Code [id number]  Katha Cabal, MD Inpatient   02/16/2018 1526 02/23/2018 2238 Full Code [id number]  Saundra Shelling, MD Inpatient   01/03/2018 2057 01/05/2018 1818 Full Code [id number]  Epifanio Lesches, MD ED   12/24/2017 1359 12/29/2017 1957 Full Code [id number]  Demetrios Loll, MD Inpatient   08/05/2017 1756 08/06/2017 1829 Full Code [id number]  Bettey Costa, MD Inpatient   02/02/2017 1830 02/05/2017 1825 Full Code [id number]  Henreitta Leber, MD Inpatient   Advance Care Planning Activity    Advance Directive Documentation     Most Recent Value  Type of Advance Directive  Healthcare Power of Attorney  Pre-existing out of facility DNR order (yellow form or pink MOST form)  --  "MOST" Form in Place?  --       Prognosis:  Very poor    Care plan was discussed with RN  Thank you for allowing the Palliative Medicine Team to assist in the care of this patient.   Total Time 15 min Prolonged Time Billed  no      Greater than 50%  of this time was spent counseling and coordinating care related to the above assessment and  plan.  Asencion Gowda, NP  Please contact Palliative Medicine Team phone at [phone] for questions and concerns.

## 2019-09-11 NOTE — Progress Notes (Signed)
Progress Note  Patient Name: MACYN REMMERT Date of Encounter: 09/11/2019  Primary Cardiologist: Dr. Saunders Revel  Subjective   Intubated, sedated.  No acute events overnight.  Patient still on multiple pressors for blood pressure support.  Inpatient Medications    Scheduled Meds: . artificial tears   Both Eyes Q8H  . chlorhexidine gluconate (MEDLINE KIT)  15 mL Mouth Rinse BID  . Chlorhexidine Gluconate Cloth  6 each Topical Q0600  . epoetin (EPOGEN/PROCRIT) injection  20,000 Units Subcutaneous Weekly  . heparin injection (subcutaneous)  5,000 Units Subcutaneous Q8H  . hydrocortisone sod succinate (SOLU-CORTEF) inj  50 mg Intravenous Q6H  . influenza vaccine adjuvanted  0.5 mL Intramuscular Tomorrow-1000  . insulin aspart  0-6 Units Subcutaneous Q4H  . mouth rinse  15 mL Mouth Rinse 10 times per day  . pantoprazole (PROTONIX) IV  40 mg Intravenous QHS  . pneumococcal 23 valent vaccine  0.5 mL Intramuscular Tomorrow-1000  . sodium chloride flush  3 mL Intravenous Q12H   Continuous Infusions: .  prismasol BGK 4/2.5 200 mL/hr at 09/18/2019 1606  .  prismasol BGK 4/2.5 200 mL/hr at 09/16/2019 1606  . sodium chloride    . fentaNYL infusion INTRAVENOUS 300 mcg/hr (09/11/19 1000)  . phenylephrine (NEO-SYNEPHRINE) Adult infusion 160 mcg/min (09/11/19 1000)  . piperacillin-tazobactam (ZOSYN)  IV Stopped (09/11/19 0721)  . prismasol BGK 4/2.5 500 mL/hr at 09/10/19 0222  . vancomycin Stopped (09/10/19 1902)  . vasopressin (PITRESSIN) infusion - *FOR SHOCK* 0.03 Units/min (09/11/19 1000)   PRN Meds: sodium chloride, acetaminophen, artificial tears, fentaNYL (SUBLIMAZE) injection, heparin, midazolam, ondansetron (ZOFRAN) IV, polyvinyl alcohol, sodium chloride flush   Vital Signs    Vitals:   09/11/19 0930 09/11/19 1000 09/11/19 1030 09/11/19 1130  BP: (!) 166/40 (!) 157/41 (!) 167/61   Pulse: 66 65 65 73  Resp: (!) '23 16 14 14  '$ Temp:    98.9 F (37.2 C)  TempSrc:    Oral  SpO2: (!) 88% 99%  100% 96%  Weight:        Intake/Output Summary (Last 24 hours) at 09/11/2019 1139 Last data filed at 09/11/2019 1000 Gross per 24 hour  Intake 2011.26 ml  Output 1827 ml  Net 184.26 ml   Last 3 Weights 09/11/2019 09/10/2019 09/23/2019  Weight (lbs) 159 lb 2.8 oz 160 lb 0.9 oz 164 lb 0.4 oz  Weight (kg) 72.2 kg 72.6 kg 74.4 kg      Telemetry    Sinus rhythm, heart rate 68- Personally Reviewed  ECG    No new tracing obtained- Personally Reviewed  Physical Exam   GEN:  Intubated, sedated Neck: No JVD Cardiac: RRR, no murmurs, rubs, or gallops.  Respiratory:  Vented breath sounds GI: Soft, nontender, non-distended  MS: No edema; No deformity. Neuro:  Unable to assess Psych: Unable to assess  Labs    High Sensitivity Troponin:   Recent Labs  Lab 09/21/2019 0749 10/01/2019 0839 10/02/2019 2057 09/10/19 0437  TROPONINIHS 445* 622* 4,042* 3,810*      Chemistry Recent Labs  Lab 09/07/19 0655 09/07/19 0655 09/19/2019 0749 09/15/2019 0924 09/10/19 0437 09/10/19 1553 09/11/19 0426  NA 135   < > 142   < > 134* 137 137  K 5.0   < > 5.4*   < > 4.5 6.0* 5.6*  CL 91*   < > 101   < > 97* 97* 99  CO2 27   < > 12*   < > 20* 20* 20*  GLUCOSE  218*   < > 119*   < > 371* 267* 235*  BUN 34*   < > 47*   < > 51* 45* 42*  CREATININE 7.14*   < > 8.46*   < > 7.99* 6.79* 5.70*  CALCIUM 8.1*   < > 7.0*   < > 6.9* 7.4* 7.5*  PROT 6.8  --  5.2*  --   --   --   --   ALBUMIN 2.6*   < > 2.0*   < > 2.4* 2.5* 2.5*  AST 65*  --  56*  --   --   --   --   ALT 41  --  31  --   --   --   --   ALKPHOS 146*  --  107  --   --   --   --   BILITOT 1.7*  --  1.4*  --   --   --   --   GFRNONAA 7*   < > 6*   < > 6* 8* 9*  GFRAA 8*   < > 7*   < > 7* 9* 11*  ANIONGAP 17*   < > 29*   < > 17* 20* 18*   < > = values in this interval not displayed.     Hematology Recent Labs  Lab 09/11/2019 0924 09/10/19 0223 09/11/19 0427  WBC 24.3* 19.9* 21.3*  RBC 3.25* 3.20* 3.05*  HGB 9.1* 8.8* 8.4*  HCT 30.6* 27.8*  27.0*  MCV 94.2 86.9 88.5  MCH 28.0 27.5 27.5  MCHC 29.7* 31.7 31.1  RDW 22.2* 22.3* 23.1*  PLT 323 240 225    BNPNo results for input(s): BNP, PROBNP in the last 168 hours.   DDimer No results for input(s): DDIMER in the last 168 hours.   Radiology    DG Abd 1 View  Result Date: 09/07/2019 CLINICAL DATA:  Status post OG tube placement. EXAM: ABDOMEN - 1 VIEW COMPARISON:  None. FINDINGS: OG tube tip and side port are in the stomach. IMPRESSION: As above. Electronically Signed   By: Inge Rise M.D.   On: 09/06/2019 12:29   DG Chest Port 1 View  Result Date: 09/10/2019 CLINICAL DATA:  Interval change EXAM: PORTABLE CHEST 1 VIEW COMPARISON:  Yesterday FINDINGS: Endotracheal tube tip is at the clavicular heads. The enteric tube and side-port reaches the stomach. Cardiomegaly. Hazy opacity of the lower chest without definite change given differences in projection. No visible pneumothorax. Left IJ line with tip at the upper cavoatrial junction. Left subclavian stenting. IMPRESSION: 1. Hazy opacity at the bases, usually atelectasis and pleural fluid. 2. Stable hardware positioning. Electronically Signed   By: Monte Fantasia M.D.   On: 09/10/2019 05:22   DG Chest Port 1 View  Result Date: 09/23/2019 CLINICAL DATA:  Status post central line and OG tube placement. EXAM: PORTABLE CHEST 1 VIEW COMPARISON:  Single-view of the chest earlier today. FINDINGS: The patient's endotracheal tube is in good position with the tip just below the clavicular heads, 5 cm above the carina. New left IJ approach central venous catheter is in place with the tip projecting in the lower superior vena cava. OG tube tip and side-port are in the stomach. No pneumothorax. Lungs clear. Heart size normal. Atherosclerosis. IMPRESSION: Support tubes and lines projecting good position.  No pneumothorax. Lungs clear. Electronically Signed   By: Inge Rise M.D.   On: 09/22/2019 12:28   ECHOCARDIOGRAM LIMITED  Result Date:  09/11/2019    ECHOCARDIOGRAM LIMITED REPORT   Patient Name:   NOBEL BRAR Date of Exam: 09/10/2019 Medical Rec #:  [id number]    Height:       71.0 in Accession #:    [id number]   Weight:       160.1 lb Date of Birth:  1950-12-11    BSA:          1.918 m Patient Age:    69 years     BP:           174/36 mmHg Patient Gender: M            HR:           73 bpm. Exam Location:  [facility] Procedure: 2D Echo, Limited Echo, Limited Color Doppler and Cardiac Doppler Indications:     410 Acute Myocardial Infarction  History:         Patient has prior history of Echocardiogram examinations, most                  recent 09/03/2019. Risk Factors:Hypertension, Diabetes and                  Dyslipidemia. Stroke. Peripheral vascular disease. Myocardial                  infarction. Murmur. End stage renal disease. Dyspnea. Coronary                  artery disease. Congestive heart failure.  Sonographer:     Wilford Sports Rodgers-Jones Referring Phys:  [id number] CHRISTOPHER END Diagnosing Phys: Nelva Bush MD IMPRESSIONS  1. No obvious vegetation is seen, though native valvular disease is present.  2. Left ventricular ejection fraction, by estimation, is 55 to 60%. The left ventricle has normal function. The left ventricle has no regional wall motion abnormalities. Left ventricular diastolic parameters are consistent with Grade II diastolic dysfunction (pseudonormalization). Elevated left atrial pressure.  3. There is mild to moderate pulmonary hypertension (35 mmHg plus central venous pressure). Right ventricular systolic function is moderately reduced. The right ventricular size is moderately enlarged.  4. Left atrial size was mildly dilated.  5. Right atrial size was mild to moderately dilated.  6. The mitral valve is degenerative. Trivial mitral valve regurgitation. No evidence of mitral stenosis.  7. The aortic valve is tricuspid. Aortic valve regurgitation is not visualized. Mild to moderate aortic valve sclerosis/calcification is present,  without any evidence of aortic stenosis. FINDINGS  Left Ventricle: Left ventricular ejection fraction, by estimation, is 55 to 60%. The left ventricle has normal function. The left ventricle has no regional wall motion abnormalities. The left ventricular internal cavity size was normal in size. There is  borderline left ventricular hypertrophy. Elevated left atrial pressure. Right Ventricle: There is mild to moderate pulmonary hypertension (35 mmHg plus central venous pressure). The right ventricular size is moderately enlarged. No increase in right ventricular wall thickness. Right ventricular systolic function is moderately reduced. Left Atrium: Left atrial size was mildly dilated. Right Atrium: Right atrial size was mild to moderately dilated. Pericardium: There is no evidence of pericardial effusion. Mitral Valve: The mitral valve is degenerative in appearance. There is moderate thickening of the mitral valve leaflet(s). Trivial mitral valve regurgitation. No evidence of mitral valve stenosis. Tricuspid Valve: The tricuspid valve is normal in structure. Tricuspid valve regurgitation is mild. Aortic Valve: The aortic valve is tricuspid. . There is severe thickening and moderate calcification of the aortic valve.  Aortic valve regurgitation is not visualized. Mild to moderate aortic valve sclerosis/calcification is present, without any evidence  of aortic stenosis. There is severe thickening of the aortic valve. There is moderate calcification of the aortic valve. Pulmonic Valve: The pulmonic valve was grossly normal. Pulmonic valve regurgitation is trivial. No evidence of pulmonic stenosis. Aorta: The aortic root is normal in size and structure. Pulmonary Artery: The pulmonary artery is not well seen. Venous: IVC assessment for right atrial pressure unable to be performed due to mechanical ventilation. Additional Comments: There is pleural effusion in the left lateral region.  LEFT VENTRICLE PLAX 2D LVIDd:          3.87 cm  Diastology LVIDs:         2.58 cm  LV e' lateral:   8.16 cm/s LV PW:         0.93 cm  LV E/e' lateral: 12.3 LV IVS:        1.02 cm  LV e' medial:    4.24 cm/s LVOT diam:     2.00 cm  LV E/e' medial:  23.6 LV SV:         52 LV SV Index:   27 LVOT Area:     3.14 cm  RIGHT VENTRICLE RV Basal diam:  4.99 cm RV S prime:     6.31 cm/s LEFT ATRIUM             Index       RIGHT ATRIUM           Index LA diam:        4.90 cm 2.55 cm/m  RA Area:     25.80 cm LA Vol (A2C):   48.2 ml 25.13 ml/m RA Volume:   102.00 ml 53.18 ml/m LA Vol (A4C):   65.0 ml 33.89 ml/m LA Biplane Vol: 61.0 ml 31.80 ml/m  AORTIC VALVE LVOT Vmax:   94.20 cm/s LVOT Vmean:  60.050 cm/s LVOT VTI:    0.166 m  AORTA Ao Root diam: 3.10 cm MITRAL VALVE                TRICUSPID VALVE MV Area (PHT): 2.69 cm     TR Peak grad:   34.3 mmHg MV Decel Time: 282 msec     TR Vmax:        293.00 cm/s MV E velocity: 100.00 cm/s MV A velocity: 102.00 cm/s  SHUNTS MV E/A ratio:  0.98         Systemic VTI:  0.17 m                             Systemic Diam: 2.00 cm Nelva Bush MD Electronically signed by Nelva Bush MD Signature Date/Time: 09/11/2019/7:14:22 AM    Final     Cardiac Studies   TTE (09/03/19): 1. Left ventricular ejection fraction, by estimation, is 55 to 60%. The  left ventricle has normal function. The left ventricle has no regional  wall motion abnormalities. There is moderate left ventricular hypertrophy.  Left ventricular diastolic  parameters are consistent with Grade I diastolic dysfunction (impaired  relaxation).  2. Right ventricular systolic function is moderately reduced. The right  ventricular size is moderate to severely enlarged. Tricuspid regurgitation  signal is inadequate for assessing PA pressure. Septal flattening ande RV  dilation concerning for elevated  RV pressure and volume.  3. Left atrial size was mildly dilated.  4. Right atrial size was  moderately dilated.  5. Tricuspid valve  regurgitation is mild to moderate.  6. The aortic valve has heavy sclerosis, Very mild aortic valve stenosis.  7. The inferior vena cava is normal in size with greater than 50%  respiratory variability, suggesting right atrial pressure of 3 mmHg.  8. No valve vegetation noted.   Patient Profile     69 y.o. male with history of CAD/PCI to the LAD, CTO of LAD RCA, peripheral artery disease status post right BKA, left AKA, end-stage renal disease on hemodialysis, diabetes who is being seen due to bacteremia.  Assessment & Plan    1.  Bacteremia -Cultures grew staph capitis -On antibiotics as per primary team -Transthoracic echocardiogram did not reveal any vegetation -Patient is currently intubated and on pressor support. -Risks for TEE at this point outweigh potential benefit.  Also, if vegetation is found on the TEE, it will not change current management as patient is not a surgical candidate. -If clinical symptoms improve, and patient stays bacteremic, TEE may be considered.  2.  Sepsis, shock -On multiple pressors as per ICU team -Management as per ICU team  3. ESRD,  -on hd as per renal  Overall patient's clinical status is guarded.  Please let us know if patient's symptoms change requiring further cardiac input.  Restart PTA cardiac meds when able.      Signed, Kate Sable, MD  09/11/2019, 11:39 AM

## 2019-09-12 DIAGNOSIS — E872 Acidosis: Secondary | ICD-10-CM

## 2019-09-12 LAB — RENAL FUNCTION PANEL
Albumin: 2.5 g/dL — ABNORMAL LOW (ref 3.5–5.0)
Anion gap: 23 — ABNORMAL HIGH (ref 5–15)
BUN: 40 mg/dL — ABNORMAL HIGH (ref 8–23)
CO2: 14 mmol/L — ABNORMAL LOW (ref 22–32)
Calcium: 8.3 mg/dL — ABNORMAL LOW (ref 8.9–10.3)
Chloride: 102 mmol/L (ref 98–111)
Creatinine, Ser: 4.47 mg/dL — ABNORMAL HIGH (ref 0.61–1.24)
GFR calc Af Amer: 15 mL/min — ABNORMAL LOW (ref >60)
GFR calc non Af Amer: 13 mL/min — ABNORMAL LOW (ref >60)
Glucose, Bld: 72 mg/dL (ref 70–99)
Phosphorus: 6.1 mg/dL — ABNORMAL HIGH (ref 2.5–4.6)
Potassium: 5.3 mmol/L — ABNORMAL HIGH (ref 3.5–5.1)
Sodium: 139 mmol/L (ref 135–145)

## 2019-09-12 LAB — GLUCOSE, CAPILLARY
Glucose-Capillary: <10 mg/dL — CL (ref 70–99)
Glucose-Capillary: 93 mg/dL (ref 70–99)
Glucose-Capillary: 136 mg/dL — ABNORMAL HIGH (ref 70–99)
Glucose-Capillary: 42 mg/dL — CL (ref 70–99)

## 2019-09-12 LAB — MAGNESIUM: Magnesium: 2.6 mg/dL — ABNORMAL HIGH (ref 1.7–2.4)

## 2019-09-12 LAB — TROPONIN I (HIGH SENSITIVITY)
Troponin I (High Sensitivity): 1132 ng/L (ref <18)
Troponin I (High Sensitivity): 1152 ng/L (ref <18)

## 2019-09-12 MED ORDER — GLYCOPYRROLATE 0.2 MG/ML IJ SOLN: 0.2000 mg | INTRAMUSCULAR | Status: DC | PRN

## 2019-09-12 MED ORDER — MORPHINE 100MG IN NS 100ML (1MG/ML) PREMIX INFUSION
0.0000 mg/h | INTRAVENOUS | Status: DC
  Administered 2019-09-12: 5 mg/h via INTRAVENOUS
  Filled 2019-09-12: qty 100

## 2019-09-12 MED ORDER — DEXTROSE 50 % IV SOLN
50.0000 mL | Freq: Once | INTRAVENOUS | Status: AC
  Administered 2019-09-12: 50 mL via INTRAVENOUS

## 2019-09-12 MED ORDER — ACETAMINOPHEN 650 MG RE SUPP: 650.0000 mg | Freq: Four times a day (QID) | RECTAL | Status: DC | PRN

## 2019-09-12 MED ORDER — MIDAZOLAM HCL 2 MG/2ML IJ SOLN: 2.0000 mg | INTRAMUSCULAR | Status: DC | PRN

## 2019-09-12 MED ORDER — MORPHINE BOLUS VIA INFUSION
5.0000 mg | INTRAVENOUS | Status: DC | PRN
  Administered 2019-09-12: 5 mg via INTRAVENOUS
  Filled 2019-09-12: qty 5

## 2019-09-12 MED ORDER — DIPHENHYDRAMINE HCL 50 MG/ML IJ SOLN: 25.0000 mg | INTRAMUSCULAR | Status: DC | PRN

## 2019-09-12 MED ORDER — POLYVINYL ALCOHOL 1.4 % OP SOLN
1.0000 [drp] | Freq: Four times a day (QID) | OPHTHALMIC | Status: DC | PRN
  Filled 2019-09-12: qty 15

## 2019-09-12 MED ORDER — GLYCOPYRROLATE 1 MG PO TABS
1.0000 mg | ORAL_TABLET | ORAL | Status: DC | PRN
  Filled 2019-09-12: qty 1

## 2019-09-12 MED ORDER — GLYCOPYRROLATE 0.2 MG/ML IJ SOLN
0.2000 mg | INTRAMUSCULAR | Status: DC | PRN
  Administered 2019-09-12: 0.2 mg via INTRAVENOUS
  Filled 2019-09-12: qty 1

## 2019-09-12 MED ORDER — ACETAMINOPHEN 325 MG PO TABS: 650.0000 mg | ORAL_TABLET | Freq: Four times a day (QID) | ORAL | Status: DC | PRN

## 2019-09-12 MED ORDER — DEXTROSE 5 % IV SOLN: INTRAVENOUS | Status: DC

## 2019-09-12 MED ORDER — MORPHINE SULFATE (PF) 2 MG/ML IV SOLN
2.0000 mg | INTRAVENOUS | Status: DC | PRN
  Administered 2019-09-12: 2 mg via INTRAVENOUS
  Filled 2019-09-12: qty 1

## 2019-09-14 LAB — CULTURE, BLOOD (ROUTINE X 2)
Culture: NO GROWTH
Culture: NO GROWTH
Special Requests: ADEQUATE

## 2019-09-20 ENCOUNTER — Ambulatory Visit (INDEPENDENT_AMBULATORY_CARE_PROVIDER_SITE_OTHER): Payer: Medicare Other | Admitting: Nurse Practitioner

## 2019-09-23 ENCOUNTER — Ambulatory Visit (INDEPENDENT_AMBULATORY_CARE_PROVIDER_SITE_OTHER): Payer: Medicare Other | Admitting: Vascular Surgery

## 2019-09-23 ENCOUNTER — Encounter (INDEPENDENT_AMBULATORY_CARE_PROVIDER_SITE_OTHER): Payer: Medicare Other

## 2019-10-01 DIAGNOSIS — A419 Sepsis, unspecified organism: Secondary | ICD-10-CM

## 2019-10-03 NOTE — Progress Notes (Signed)
ID sign off Pt now comfort care. Will sign off.

## 2019-10-03 NOTE — Progress Notes (Signed)
Orders for comfort care placed. [facility] notified. Morphine drip requested from pharmacy. [facility]-Link nurse and CCMD made aware of plan. Family at bedside.

## 2019-10-03 NOTE — Progress Notes (Signed)
Nutrition Brief Follow-Up Note  Chart reviewed. Patient has transitioned to comfort care.   No further nutrition interventions warranted at this time. Please re-consult RD as needed.   Dennis Franze King, MS, RD, LDN Pager number available on Amion  

## 2019-10-03 NOTE — Progress Notes (Signed)
[facility] Daily Progress Note   Communication Note Patient has been transitioned to comfort care. Vascular Surgery will sign off at this time.   Discussed with Dr. Ellis Parents Tyrene Nader PA-C October 02, 2019 9:51 AM

## 2019-10-03 NOTE — Progress Notes (Signed)
Report called to [facility]. Patient on morphine drip, resting comfortably. Family at patients bedside.

## 2019-10-03 NOTE — Progress Notes (Signed)
Extubation order acknowledged.  Patient extubated to comfort care at 08:28 by Fritz Pickerel with cardiopulmonary. Morphine given for comfort after fentanyl drip turned off and prior to extubation per Dr. Mortimer Fries request.  Morphine drip initiated per MD order

## 2019-10-03 NOTE — Progress Notes (Addendum)
Daily Progress Note   Patient Name: Dennis Walker       Date: 2019-09-25 DOB: 1950/09/14  Age: 70 y.o. MRN#: [id number] Attending Physician: Flora Lipps, MD Primary Care Physician: Juluis Pitch, MD Admit Date: 09/11/2019  Reason for Consultation/Follow-up: Establishing goals of care  Subjective: Patient is resting in bed. He has been extubated. His hands are cool to the touch. Breathing unlabored. He does not respond. He appears comfortable. Family at bedside. Support offered to family.   Length of Stay: 3  Current Medications: Scheduled Meds:  . artificial tears   Both Eyes Q8H    Continuous Infusions: . morphine 6 mg/hr (09/25/2019 1359)    PRN Meds: acetaminophen **OR** acetaminophen, artificial tears, diphenhydrAMINE, glycopyrrolate **OR** glycopyrrolate **OR** glycopyrrolate, midazolam, morphine injection, morphine, polyvinyl alcohol, polyvinyl alcohol  Physical Exam Constitutional:      Comments: On vent  Neurological:     Comments: Left eye open.              Vital Signs: BP 129/71   Pulse (!) 41   Temp 97.6 F (36.4 C) (Axillary)   Resp 14   Ht 5\' 11"  (1.803 m)   Wt 70.6 kg   SpO2 (!) 89%   BMI 21.71 kg/m  SpO2: SpO2: (!) 89 % O2 Device: O2 Device: Ventilator O2 Flow Rate:    Intake/output summary:   Intake/Output Summary (Last 24 hours) at 09/25/2019 1627 Last data filed at 2019-09-25 0700 Gross per 24 hour  Intake 1110.14 ml  Output 1136 ml  Net -25.86 ml   LBM: Last BM Date: 2019-09-25 Baseline Weight: Weight: 74.4 kg Most recent weight: Weight: 70.6 kg       Palliative Assessment/Data:      Patient Active Problem List   Diagnosis Date Noted  . Pressure injury of skin 09/10/2019  . Respiratory failure ([place]) 09/20/2019  . Gangrene of left  foot ([place])   . Septic shock ([place])   . Advanced care planning/counseling discussion   . Goals of care, counseling/discussion   . Palliative care by specialist   . Severe sepsis ([place]) 08/29/2019  . Gangrenous toe ([place]) 08/28/2019  . Hypotension 08/28/2019  . PVD (peripheral vascular disease) ([place]) 08/28/2019  . Hx of BKA, right ([place]) 08/28/2019  . Anemia in CKD (chronic kidney disease) 06/26/2019  . 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 Dialysis AV fistula malfunction ([place]) 06/26/2019  . Nephrotic range proteinuria 06/26/2019  . Encounter for orthopedic follow-up care 05/20/2019  . Atherosclerosis of native arteries of the extremities with ulceration ([place]) 11/12/2018  . Coronary artery disease involving native coronary artery of native heart without angina pectoris 08/18/2018  . Hyperlipidemia LDL goal <70 08/18/2018  . Preprocedural cardiovascular examination 08/18/2018  . MRSA bacteremia   . Infection of AV graft for dialysis ([place])   . Bacteremia 02/16/2018  . 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 Chest pain, mid sternal 01/03/2018  . Acute on chronic respiratory failure with hypoxia ([place]) 12/24/2017  . Pain in right hand 08/25/2017  . Elevated troponin 08/05/2017  . 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 Complication from renal dialysis device 02/20/2017  . ESRD (end stage renal disease) ([place]) 02/12/2017  . Altered mental status 02/02/2017  . Atherosclerosis of native arteries of extremity with intermittent claudication ([place]) 12/20/2016  . Ulcer of amputation stump of lower extremity ([place]) 12/20/2016  . Diabetes ([place]) 12/20/2016  . Hypertension 12/20/2016  . Pure hypercholesterolemia 12/20/2016  . Bleeding 07/29/2016  . Panic attack 01/21/2016  . Chronic systolic heart failure ([place]) 10/27/2015  . Type 2 diabetes mellitus with diabetic nephropathy, with long-term current use of insulin ([place]) 04/06/2015  . Overweight (BMI 25.0-29.9) 12/04/2014  . Vitamin D deficiency 03/17/2014  . TIA  (transient ischemic attack) 01/04/2014  . Vision abnormalities 01/03/2014  . Chronic hepatitis C virus infection ([place]) 11/01/2013  . Arteriovenous fistula ([place]) 08/27/2013  . CHF exacerbation ([place]) 12/12/2012  . Constipation 02/01/2012  . Ischemic cardiomyopathy 11/17/2010  . Retinopathy, diabetic, background ([place]) 11/11/2008  . Carotid artery stenosis 11/03/2008  . Cerebral infarct ([place]) 10/31/2008  . 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 Amputation of leg ([place]) 02/02/2004  . Poliomyelitis 07/04/1954    Palliative Care Assessment & Plan    Recommendations/Plan:  On comfort care per CCM. He appears comfortable. No changes to current  orders.    Code Status:    Code Status Orders  (From admission, onward)         Start     Ordered   09/29/2019 1552  Do not attempt resuscitation (DNR)  Continuous    Question Answer Comment  In the event of cardiac or respiratory ARREST Do not call a "code blue"   In the event of cardiac or respiratory ARREST Do not perform Intubation, CPR, defibrillation or ACLS   In the event of cardiac or respiratory ARREST Use medication by any route, position, wound care, and other measures to relive pain and suffering. May use oxygen, suction and manual treatment of airway obstruction as needed for comfort.      09/18/2019 1552        Code Status History    Date Active Date Inactive Code Status Order ID Comments User Context   09/19/2019 0906 10/02/2019 1552 Full Code [id number]  Flora Lipps, MD ED   08/28/2019 2056 09/05/2019 2153 Full Code [id number]  Orene Desanctis, DO ED   12/04/2018 1347 12/04/2018 1826 Full Code [id number]  Katha Cabal, MD Inpatient   02/16/2018 1526 02/23/2018 2238 Full Code [id number]  Saundra Shelling, MD Inpatient   01/03/2018 2057 01/05/2018 1818 Full Code [id number]  Epifanio Lesches, MD ED   12/24/2017 1359 12/29/2017 1957 Full Code [id number]  Demetrios Loll, MD Inpatient   08/05/2017 1756 08/06/2017 1829 Full Code [id number]  Bettey Costa, MD  Inpatient   02/02/2017 1830 02/05/2017 1825 Full Code  [id number]  Henreitta Leber, MD Inpatient   Advance Care Planning Activity    Advance Directive Documentation     Most Recent Value  Type of Advance Directive  Healthcare Power of Attorney  Pre-existing out of facility DNR order (yellow form or pink MOST form)  --  "MOST" Form in Place?  --       Prognosis: Hours to days. Not stable to transfer out of hospital.    Care plan was discussed with RN  Thank you for allowing the Palliative Medicine Team to assist in the care of this patient.   Total Time 15 min Prolonged Time Billed  no      Greater than 50%  of this time was spent counseling and coordinating care related to the above assessment and plan.  Asencion Gowda, NP  Please contact Palliative Medicine Team phone at [phone] for questions and concerns.

## 2019-10-03 NOTE — Progress Notes (Signed)
Pt now with sustained bradycardia HR in the 40's (had intermittent episodes earlier today).  Noted to be in junctional rhythm with T wave inversion in inferior and lateral leads concerning for ischemia.  Troponin is currently pending.  Cardiology is already following and indicates he is not a candidate for CATH.  Called and updated pt's daughter Ms. Scott of EKG changes.  She reports tentative plan is likely for transition to comfort care measures later today or tomorrow.  Will continue to monitor, with no escalation of care from current measures in the event that the pt decompensates further.  He is DNR/DNI.  Daughter is in agreement with plan, and asks that if her father decompensates further that she be notified so she can be at bedside as he passes.    Darel Hong, AGACNP-BC [facility] Pager: [phone]

## 2019-10-03 NOTE — Progress Notes (Signed)
CRITICAL CARE NOTE 69 y.o.malethe history of ESRD on HD, diabetes, recent AKA of the left lower extremity, hypertension, hyperlipidemia, peripheral vascular disease on Plavix who presents from his nursing home for unresponsiveness.  Presented with- -His heart rate in the 30s. low glucose levels -An IO was placed in the field. Patient arrives in the emergency room with a GCS of 3, pulse in the 40s, repeat blood glucose in the 60s, and BP in the 60s. Patient had left and gaze deviation but no other signs of seizure.According to EMS patient has missed his last 2 dialysis treatment.    SIGNIFICANT EVENTS 3/8 admitted for multiorgan failure and septic shock 3/9 remains intubated, on vent, multiple vasopressors 3/10 remains on vent, multiorgan failure, remains DNR, on CRRT Multiple vasopressors 3/11 patient is dying process  Family At bedside, clinical status relayed to family  Updated and notified of patients medical condition-  Progressive multiorgan failure with very low chance of meaningful recovery.  Patient is in dying  Process.  Family understands the situation.  They have consented and agreed to DNR/DNI and would like to proceed with Comfort care measures.   Family are satisfied with Plan of action and management. All questions answered        CC  follow up respiratory failure  SUBJECTIVE Patient remains critically ill Prognosis is guarded   BP 129/71   Pulse (!) 41   Temp 97.6 F (36.4 C) (Axillary)   Resp 14   Ht 5\' 11"  (1.803 m)   Wt 70.6 kg   SpO2 (!) 89%   BMI 21.71 kg/m    I/O last 3 completed shifts: In: 2709.8 [I.V.:2269.5; IV Piggyback:440.2] Out: 2760 [Emesis/NG output:150; Other:2610] No intake/output data recorded.  SpO2: (!) 89 % FiO2 (%): (S) 40 %  Estimated body mass index is 21.71 kg/m as calculated from the following:   Height as of this encounter: 5\' 11"  (1.803 m).   Weight as of this encounter: 70.6  kg.    REVIEW OF SYSTEMS  PATIENT IS UNABLE TO PROVIDE COMPLETE REVIEW OF SYSTEMS DUE TO SEVERE CRITICAL ILLNESS   Pressure Injury 09/10/19 Right Stage 2 -  Partial thickness loss of dermis presenting as a shallow open injury with a red, pink wound bed without slough. (Active)  09/10/19 1000  Location:   Location Orientation: Right  Staging: Stage 2 -  Partial thickness loss of dermis presenting as a shallow open injury with a red, pink wound bed without slough.  Wound Description (Comments):   Present on Admission:      Pressure Injury 09/10/19 Buttocks Left Stage 2 -  Partial thickness loss of dermis presenting as a shallow open injury with a red, pink wound bed without slough. (Active)  09/10/19 1000  Location: Buttocks  Location Orientation: Left  Staging: Stage 2 -  Partial thickness loss of dermis presenting as a shallow open injury with a red, pink wound bed without slough.  Wound Description (Comments):   Present on Admission:       PHYSICAL EXAMINATION:  GENERAL:critically ill appearing, +resp distress HEAD: Normocephalic, atraumatic.  EYES: BLIND  MOUTH: Moist mucosal membrane. NECK: Supple.  PULMONARY: +rhonchi, +wheezing CARDIOVASCULAR: S1 and S2. Regular rate and rhythm. No murmurs, rubs, or gallops.  GASTROINTESTINAL: Soft, nontender, -distended.  Positive bowel sounds.   MUSCULOSKELETAL:no legs NEUROLOGIC: obtunded, GCS<8 SKIN:intact,warm,dry  MEDICATIONS: I have reviewed all medications and confirmed regimen as documented   CULTURE RESULTS   Recent Results (from the past 240 hour(s))  SARS  CORONAVIRUS 2 (TAT 6-24 HRS) Nasopharyngeal Nasopharyngeal Swab     Status: None   Collection Time: 09/04/19  8:37 PM   Specimen: Nasopharyngeal Swab  Result Value Ref Range Status   SARS Coronavirus 2 NEGATIVE NEGATIVE Final    Comment: (NOTE) SARS-CoV-2 target nucleic acids are NOT DETECTED. The SARS-CoV-2 RNA is generally detectable in upper and  lower respiratory specimens during the acute phase of infection. Negative results do not preclude SARS-CoV-2 infection, do not rule out co-infections with other pathogens, and should not be used as the sole basis for treatment or other patient management decisions. Negative results must be combined with clinical observations, patient history, and epidemiological information. The expected result is Negative. Fact Sheet for Patients: SugarRoll.be Fact Sheet for Healthcare Providers: https://www.woods-mathews.com/ This test is not yet approved or cleared by the [place] FDA and  has been authorized for detection and/or diagnosis of SARS-CoV-2 by FDA under an Emergency Use Authorization (EUA). This EUA will remain  in effect (meaning this test can be used) for the duration of the COVID-19 declaration under Section 56 4(b)(1) of the Act, 21 U.S.C. section 360bbb-3(b)(1), unless the authorization is terminated or revoked sooner. Performed at [facility], [place] [address], [place], [place] [address]   Blood culture (routine x 2)     Status: None (Preliminary result)   Collection Time: 10/01/2019  7:50 AM   Specimen: BLOOD  Result Value Ref Range Status   Specimen Description BLOOD RIGHT HAND  Final   Special Requests   Final    BOTTLES DRAWN AEROBIC AND ANAEROBIC Blood Culture results may not be optimal due to an inadequate volume of blood received in culture bottles   Culture   Final    NO GROWTH 2 DAYS Performed at [facility], [address], [place], [place] [address]    Report Status PENDING  Incomplete  Urine Culture     Status: None   Collection Time: 09/07/2019  8:03 AM   Specimen: Urine, Catheterized  Result Value Ref Range Status   Specimen Description   Final    URINE, CATHETERIZED Performed at [facility], [address] [place], [place] [address]    Special Requests   Final     Immunocompromised Performed at [facility], [address] [place], [place] [address]    Culture   Final    NO GROWTH Performed at [facility], [place] [address] [place], [place] [address]    Report Status 09/10/2019 FINAL  Final  Blood culture (routine x 2)     Status: None (Preliminary result)   Collection Time: 09/18/2019 11:46 AM   Specimen: BLOOD  Result Value Ref Range Status   Specimen Description BLOOD BLOOD RIGHT HAND  Final   Special Requests   Final    BOTTLES DRAWN AEROBIC AND ANAEROBIC Blood Culture adequate volume   Culture   Final    NO GROWTH 2 DAYS Performed at [facility], [address] [place], [place] [address]    Report Status PENDING  Incomplete  MRSA PCR Screening     Status: None   Collection Time: 09/19/2019  1:19 PM   Specimen: Nasopharyngeal  Result Value Ref Range Status   MRSA by PCR NEGATIVE NEGATIVE Final    Comment:        The GeneXpert MRSA Assay (FDA approved for NASAL specimens only), is one component of a comprehensive MRSA colonization surveillance program. It is not intended to  diagnose MRSA infection nor to guide or monitor treatment for MRSA infections. Performed at [facility], [address] [place], [place] [address]           Nutrition Status: Nutrition Problem: Inadequate oral intake Etiology: inability to eat Signs/Symptoms: NPO status Interventions: Tube feeding, Prostat, MVI     Indwelling Urinary Catheter continued, requirement due to   Reason to continue Indwelling Urinary Catheter strict Intake/Output monitoring for hemodynamic instability   Central Line/ continued, requirement due to  Reason to continue [place] of central venous pressure or other hemodynamic parameters and poor IV access   Ventilator continued, requirement due to severe respiratory failure   Ventilator Sedation RASS 0 to -2      ASSESSMENT AND PLAN SYNOPSIS  69 yo AAM with  multiple medical issues with ESRD on HD with Poor protoplasm and poor vasculature with acute DIASTOLIC HEART FAILURE with acute and severe metabolic acidosis with severe toxic metabolic encephalopathy with multiorgan failure inseptic shock    Severe ACUTE Hypoxic and Hypercapnic Respiratory Failure Patient in dying process  SEVERE CARDIOGENIC SHOCK DUE TO  [place] ICU monitoring   [place] Time devoted to patient care services described in this note is 38 minutes.   Overall, patient is critically ill, prognosis is guarded.  Patient with Multiorgan failure and at high risk for cardiac arrest and death.  FAMILY HAS AGREED AND CONSENTED TO COMFORT CARE MEASURES    Luster Hechler Patricia Pesa, M.D.  [facility]  Medical Director [place] Director [facility] [place] [facility] Cardio-Pulmonary Department

## 2019-10-03 NOTE — Progress Notes (Signed)
Pt becoming more bradycardic with HR in 30's, hypoxic requiring increase in FiO2 on vent.  Called and spoke with pt's daughter Mrs. Nicki Reaper that her father is actively passing and that she should come to bedside.  She reports she will be here shortly.   Darel Hong, AGACNP-BC [facility] Pager: [phone]

## 2019-10-03 NOTE — Discharge Planning (Signed)
  2019/10/12  Patient: Dennis Walker  Date of Birth: Nov 21, 1950  Date of Visit: 09/07/2019    To Whom It May Concern:  Dennis Walker was seen and treated in our Intensive Care Unit on 09/19/2019 - Oct 12, 2019.   Sincerely,  Patricia Pesa

## 2019-10-03 NOTE — Progress Notes (Signed)
Dennis Walker visited pt.'s rm. after learning pt. was on comfort care w/family present; ICU MD said family's pastor was present.  Pt. in bed breathing slowly but unlabored, appeared comfortable, but unresponsive.  Dtr. Dennis Walker @ bedside w/her husband Dennis Walker, Dennis Walker.  Dennis Walker said family had already been praying, but welcomed additional prayer.  CH led family in prayer for pt.'s transition and for comfort for bereaved family members.  CH had extended visit talking w/pastor; family appears well supported at this time.  CH is available as needed.    2019-10-01 1030  Clinical Encounter Type  Visited With Patient and family together;Other (Comment) (Pt.'s pastor)  Visit Type Initial;Patient actively dying  Spiritual Encounters  Spiritual Needs Prayer;Emotional;Grief support  Stress Factors  Patient Stress Factors None identified  Family Stress Factors Loss;Health changes

## 2019-10-03 NOTE — Progress Notes (Signed)
Patient expired at 1715. Family at bedside. Patient pronounced by Newman Nickels RN/Mckenzie Birdwell RN. Provider made aware at 1540.

## 2019-10-03 NOTE — Death Summary Note (Signed)
DEATH SUMMARY   Patient Details  Name: RIGGS DINEEN MRN: [id number] DOB: 12/23/1950  Admission/Discharge Information   Admit Date:  10/02/19  Date of Death:   10/05/2019   Time of Death:  Oct 13, 1713  Length of Stay: 3  Referring Physician: Juluis Pitch, MD   Reason(s) for Hospitalization  ACUTE RESP FAILURE  Diagnoses  Preliminary cause of death: ISCHEMIC CARDIOMYOPATHY, ISCHEMIC COLITIS, ACIDOSIS, SEPTIC SHOCK Secondary Diagnoses (including complications and co-morbidities):  Active Problems:   Septic shock (HCC)   Respiratory failure (HCC)   Pressure injury of skin   Brief Hospital Course (including significant findings, care, treatment, and services provided and events leading to death)   69 y.o.malethe history of ESRD on HD, diabetes, recent AKA of the left lower extremity, hypertension, hyperlipidemia, peripheral vascular disease on Plavix who presents from his nursing home for unresponsiveness.  Presented with- -His heart rate in the 30s. low glucose levels -An IO was placed in the field. Patient arrives in the emergency room with a GCS of 3, pulse in the 40s, repeat blood glucose in the 60s, and BP in the 60s. Patient had left and gaze deviation but no other signs of seizure.According to EMS patient has missed his last 2 dialysis treatment.    SIGNIFICANT EVENTS 2022/10/02 admitted for multiorgan failure and septic shock 3/9 remains intubated, on vent, multiple vasopressors 3/10 remains on vent, multiorgan failure, remains DNR, on CRRT Multiple vasopressors Oct 05, 2022 patient is dying process   Severe ACUTE Hypoxic and Hypercapnic Respiratory Failure Patient in dying process  SEVERE CARDIOGENIC SHOCK DUE TO  [place] ICU monitoring   [place] Time devoted to patient care services described in this note is 38 minutes.   Overall, patient is critically ill,  prognosis is guarded.  Patient with Multiorgan failure and at high risk for cardiac arrest and death.  FAMILY HAS AGREED AND CONSENTED TO COMFORT CARE MEASURES    Pertinent Labs and Studies  Significant Diagnostic Studies DG Chest 1 View  Result Date: Oct 02, 2019 CLINICAL DATA:  Pt to ED via EMS from [facility]. Pt unresponsive upon staff assessment. Unknown last known well. Pt was seen recently for "not acting right" but was admitted and later discharged. Right arm is currently being used for dialysis. Post intubation EXAM: CHEST  1 VIEW COMPARISON:  09/07/2019 FINDINGS: Endotracheal to 3.7 cm from carina. NG to extends in the stomach enlarged cardiac silhouette. No pulmonary edema. No pneumothorax. Vascular stent in LEFT subclavian vein. IMPRESSION: 1. Intubation without complication.  NG tube in stomach. 2. Stable cardiomegaly Electronically Signed   By: Suzy Bouchard M.D.   On: 10-02-2019 07:36   DG Abd 1 View  Result Date: 10-02-2019 CLINICAL DATA:  Status post OG tube placement. EXAM: ABDOMEN - 1 VIEW COMPARISON:  None. FINDINGS: OG tube tip and side port are in the stomach. IMPRESSION: As above. Electronically Signed   By: Inge Rise M.D.   On: 10-02-19 12:29   CT Head Wo Contrast  Result Date: October 02, 2019 CLINICAL DATA:  Patient unresponsive. EXAM: CT HEAD WITHOUT CONTRAST TECHNIQUE: Contiguous axial images were obtained from the base of the skull through the vertex without intravenous contrast. COMPARISON:  12/24/2017 FINDINGS: Brain: No evidence of acute infarction, hemorrhage, hydrocephalus, extra-axial collection or mass lesion/mass effect. Patchy areas of bilateral white matter hypoattenuation are noted, stable consistent mild chronic microvascular ischemic change. 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 Vascular:  No hyperdense vessel or unexpected calcification. Skull: Normal. Negative for fracture or focal lesion. 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 No acute findings for the globes or orbits. Visualized sinuses are clear. Other: None. IMPRESSION: 1. No acute intracranial abnormalities. Stable appearance from the prior head CT. Electronically Signed   By: Lajean Manes M.D.   On: 09/20/2019 07:23   PERIPHERAL VASCULAR CATHETERIZATION  Result Date: 08/30/2019 See op note  DG Chest Port 1 View  Result Date: 09/10/2019 CLINICAL DATA:  Interval change EXAM: PORTABLE CHEST 1 VIEW COMPARISON:  Yesterday FINDINGS: Endotracheal tube tip is at the clavicular heads. The enteric tube and side-port reaches the stomach. Cardiomegaly. Hazy opacity of the lower chest without definite change given differences in projection. No visible pneumothorax. Left IJ line with tip at the upper cavoatrial junction. Left subclavian stenting. IMPRESSION: 1. Hazy opacity at the bases, usually atelectasis and pleural fluid. 2. Stable hardware positioning. Electronically Signed   By: Monte Fantasia M.D.   On: 09/10/2019 05:22   DG Chest Port 1 View  Result Date: 09/03/2019 CLINICAL DATA:  Status post central line and OG tube placement. EXAM: PORTABLE CHEST 1 VIEW COMPARISON:  Single-view of the chest earlier today. FINDINGS: The patient's endotracheal tube is in good position with the tip just below the clavicular heads, 5 cm above the carina. New left IJ approach central venous catheter is in place with the tip projecting in the lower superior vena cava. OG tube tip and side-port are in the stomach. No pneumothorax. Lungs clear. Heart size normal. Atherosclerosis. IMPRESSION: Support tubes and lines projecting good position.  No pneumothorax. Lungs clear. Electronically Signed   By: Inge Rise M.D.   On: 09/05/2019 12:28   DG Chest Portable 1 View  Result Date: 09/07/2019 CLINICAL DATA:  69 y.o.male with history of shortness of breath. EXAM: PORTABLE CHEST 1 VIEW COMPARISON:  Chest x-ray 08/28/2019. FINDINGS: Lung volumes are low. No consolidative airspace  disease. No pleural effusions. No pneumothorax. No pulmonary nodule or mass noted. Pulmonary vasculature and the cardiomediastinal silhouette are within normal limits. Aortic atherosclerosis. Vascular stent projecting over the left subclavian region. IMPRESSION: 1. Low lung volumes without radiographic evidence of acute cardiopulmonary disease. 2. Aortic atherosclerosis. Electronically Signed   By: Vinnie Langton M.D.   On: 09/07/2019 10:19   DG Chest Port 1 View  Result Date: 08/28/2019 CLINICAL DATA:  Possible left foot infection with skin changes. EXAM: PORTABLE CHEST 1 VIEW COMPARISON:  Radiographs 02/16/2018 and 01/23/2018. FINDINGS: 1758 hours. Stable mild cardiomegaly and aortic atherosclerosis. There is a left subclavian vascular stent. Mild interstitial prominence in both lungs appears unchanged. No evidence of superimposed edema, confluent airspace opacity, pneumothorax or significant pleural effusion. The bones appear unchanged. Telemetry leads overlie the chest. IMPRESSION: Stable chest with mild cardiomegaly and chronic interstitial prominence. No evidence of pneumonia. Electronically Signed   By: Richardean Sale M.D.   On: 08/28/2019 18:36   DG Foot Complete Left  Result Date: 08/28/2019 CLINICAL DATA:  Possible left foot infection. Progressive symptoms over the last 5 days with skin darkening of the toes and foul odor. EXAM: LEFT FOOT - COMPLETE 3+ VIEW COMPARISON:  None. FINDINGS: The bones appear adequately mineralized. There is no evidence of acute fracture or dislocation. The soft tissues of the 2nd toe appear atretic with possible exposure of the distal tuft. There are possible erosions of the distal tuft of the 2nd digit as well as the great toe. No other evidence of  bone destruction. Extensive vascular calcifications are noted. There is mild dorsal forefoot soft tissue swelling without soft tissue emphysema or foreign body. IMPRESSION: Atretic soft tissues of the 2nd toe suspicious  for infarction. Possible tuft erosions of the distal 1st and 2nd digits. These findings could reflect early osteomyelitis. Extensive vascular calcifications. Electronically Signed   By: Richardean Sale M.D.   On: 08/28/2019 18:40   ECHOCARDIOGRAM COMPLETE  Result Date: 09/03/2019    ECHOCARDIOGRAM REPORT   Patient Name:   JAVEL HERSH Date of Exam: 09/03/2019 Medical Rec #:  [id number]    Height:       71.0 in Accession #:    [id number]   Weight:       163.4 lb Date of Birth:  02/07/1951    BSA:          1.935 m Patient Age:    31 years     BP:           152/48 mmHg Patient Gender: M            HR:           78 bpm. Exam Location:  [facility] Procedure: 2D Echo, Color Doppler and Cardiac Doppler Indications:     R78.81 Bacteremia  History:         Patient has prior history of Echocardiogram examinations. CHF,                  Previous Myocardial Infarction and CAD, Stroke and PVD, ESRD;                  Risk Factors:Hypertension, Dyslipidemia and Diabetes.  Sonographer:     Charmayne Sheer RDCS (AE) Referring Phys:  [id number] Floyce Stakes GRIFFITH Diagnosing Phys: Ida Rogue MD  Sonographer Comments: Suboptimal apical window. IMPRESSIONS  1. Left ventricular ejection fraction, by estimation, is 55 to 60%. The left ventricle has normal function. The left ventricle has no regional wall motion abnormalities. There is moderate left ventricular hypertrophy. Left ventricular diastolic parameters are consistent with Grade I diastolic dysfunction (impaired relaxation).  2. Right ventricular systolic function is moderately reduced. The right ventricular size is moderate to severely enlarged. Tricuspid regurgitation signal is inadequate for assessing PA pressure. Septal flattening ande RV dilation concerning for elevated  RV pressure and volume.  3. Left atrial size was mildly dilated.  4. Right atrial size was moderately dilated.  5. Tricuspid valve regurgitation is mild to moderate.  6. The aortic valve has heavy sclerosis, Very mild  aortic valve stenosis.  7. The inferior vena cava is normal in size with greater than 50% respiratory variability, suggesting right atrial pressure of 3 mmHg.  8. No valve vegetation noted. FINDINGS  Left Ventricle: Left ventricular ejection fraction, by estimation, is 55 to 60%. The left ventricle has normal function. The left ventricle has no regional wall motion abnormalities. The left ventricular internal cavity size was normal in size. There is  moderate left ventricular hypertrophy. Left ventricular diastolic parameters are consistent with Grade I diastolic dysfunction (impaired relaxation). Right Ventricle: The right ventricular size is moderately enlarged. No increase in right ventricular wall thickness. Right ventricular systolic function is moderately reduced. Tricuspid regurgitation signal is inadequate for assessing PA pressure. Left Atrium: Left atrial size was mildly dilated. Right Atrium: Right atrial size was moderately dilated. Pericardium: There is no evidence of pericardial effusion. Mitral Valve: The mitral valve is normal in structure and function. Normal mobility of the mitral valve leaflets. No  evidence of mitral valve regurgitation. No evidence of mitral valve stenosis. MV peak gradient, 5.3 mmHg. The mean mitral valve gradient is 3.0 mmHg. Tricuspid Valve: The tricuspid valve is normal in structure. Tricuspid valve regurgitation is mild to moderate. No evidence of tricuspid stenosis. Aortic Valve: The aortic valve is normal in structure and function. Aortic valve regurgitation is not visualized. Mild aortic stenosis is present. Aortic valve mean gradient measures 8.0 mmHg. Aortic valve peak gradient measures 17.1 mmHg. Aortic valve area, by VTI measures 1.29 cm. Pulmonic Valve: The pulmonic valve was normal in structure. Pulmonic valve regurgitation is not visualized. No evidence of pulmonic stenosis. Aorta: The aortic root is normal in size and structure. Venous: The inferior vena cava is  normal in size with greater than 50% respiratory variability, suggesting right atrial pressure of 3 mmHg. IAS/Shunts: No atrial level shunt detected by color flow Doppler.  LEFT VENTRICLE PLAX 2D LVIDd:         3.77 cm  Diastology LVIDs:         2.05 cm  LV e' lateral:   10.00 cm/s LV PW:         1.40 cm  LV E/e' lateral: 9.9 LV IVS:        0.76 cm  LV e' medial:    5.98 cm/s LVOT diam:     1.80 cm  LV E/e' medial:  16.6 LV SV:         46 LV SV Index:   24 LVOT Area:     2.54 cm  RIGHT VENTRICLE RV Basal diam:  3.39 cm LEFT ATRIUM             Index       RIGHT ATRIUM           Index LA diam:        4.10 cm 2.12 cm/m  RA Area:     18.40 cm LA Vol (A2C):   67.9 ml 35.09 ml/m RA Volume:   47.50 ml  24.55 ml/m LA Vol (A4C):   86.8 ml 44.86 ml/m LA Biplane Vol: 80.7 ml 41.71 ml/m  AORTIC VALVE                    PULMONIC VALVE AV Area (Vmax):    1.24 cm     PV Vmax:       0.84 m/s AV Area (Vmean):   1.39 cm     PV Vmean:      46.600 cm/s AV Area (VTI):     1.29 cm     PV VTI:        0.124 m AV Vmax:           207.00 cm/s  PV Peak grad:  2.9 mmHg AV Vmean:          123.000 cm/s PV Mean grad:  1.0 mmHg AV VTI:            0.353 m AV Peak Grad:      17.1 mmHg AV Mean Grad:      8.0 mmHg LVOT Vmax:         101.00 cm/s LVOT Vmean:        67.300 cm/s LVOT VTI:          0.179 m LVOT/AV VTI ratio: 0.51  AORTA Ao Root diam: 2.90 cm MITRAL VALVE MV Area (PHT): 3.37 cm     SHUNTS MV Peak grad:  5.3 mmHg     Systemic VTI:  0.18 m MV Mean grad:  3.0 mmHg     Systemic Diam: 1.80 cm MV Vmax:       1.15 m/s MV Vmean:      82.2 cm/s MV Decel Time: 225 msec MV E velocity: 99.00 cm/s MV A velocity: 107.00 cm/s MV E/A ratio:  0.93 Ida Rogue MD Electronically signed by Ida Rogue MD Signature Date/Time: 09/03/2019/11:41:02 AM    Final    ECHOCARDIOGRAM LIMITED  Result Date: 09/11/2019    ECHOCARDIOGRAM LIMITED REPORT   Patient Name:   SHION BLUESTEIN Date of Exam: 09/10/2019 Medical Rec #:  [id number]    Height:       71.0 in  Accession #:    [id number]   Weight:       160.1 lb Date of Birth:  1950-09-23    BSA:          1.918 m Patient Age:    70 years     BP:           174/36 mmHg Patient Gender: M            HR:           73 bpm. Exam Location:  [facility] Procedure: 2D Echo, Limited Echo, Limited Color Doppler and Cardiac Doppler Indications:     410 Acute Myocardial Infarction  History:         Patient has prior history of Echocardiogram examinations, most                  recent 09/03/2019. Risk Factors:Hypertension, Diabetes and                  Dyslipidemia. Stroke. Peripheral vascular disease. Myocardial                  infarction. Murmur. End stage renal disease. Dyspnea. Coronary                  artery disease. Congestive heart failure.  Sonographer:     Wilford Sports Rodgers-Jones Referring Phys:  [id number] CHRISTOPHER END Diagnosing Phys: Nelva Bush MD IMPRESSIONS  1. No obvious vegetation is seen, though native valvular disease is present.  2. Left ventricular ejection fraction, by estimation, is 55 to 60%. The left ventricle has normal function. The left ventricle has no regional wall motion abnormalities. Left ventricular diastolic parameters are consistent with Grade II diastolic dysfunction (pseudonormalization). Elevated left atrial pressure.  3. There is mild to moderate pulmonary hypertension (35 mmHg plus central venous pressure). Right ventricular systolic function is moderately reduced. The right ventricular size is moderately enlarged.  4. Left atrial size was mildly dilated.  5. Right atrial size was mild to moderately dilated.  6. The mitral valve is degenerative. Trivial mitral valve regurgitation. No evidence of mitral stenosis.  7. The aortic valve is tricuspid. Aortic valve regurgitation is not visualized. Mild to moderate aortic valve sclerosis/calcification is present, without any evidence of aortic stenosis. FINDINGS  Left Ventricle: Left ventricular ejection fraction, by estimation, is 55 to 60%. The left ventricle  has normal function. The left ventricle has no regional wall motion abnormalities. The left ventricular internal cavity size was normal in size. There is  borderline left ventricular hypertrophy. Elevated left atrial pressure. Right Ventricle: There is mild to moderate pulmonary hypertension (35 mmHg plus central venous pressure). The right ventricular size is moderately enlarged. No increase in right ventricular wall thickness. Right ventricular systolic function is moderately reduced. Left Atrium: Left atrial size was  mildly dilated. Right Atrium: Right atrial size was mild to moderately dilated. Pericardium: There is no evidence of pericardial effusion. Mitral Valve: The mitral valve is degenerative in appearance. There is moderate thickening of the mitral valve leaflet(s). Trivial mitral valve regurgitation. No evidence of mitral valve stenosis. Tricuspid Valve: The tricuspid valve is normal in structure. Tricuspid valve regurgitation is mild. Aortic Valve: The aortic valve is tricuspid. . There is severe thickening and moderate calcification of the aortic valve. Aortic valve regurgitation is not visualized. Mild to moderate aortic valve sclerosis/calcification is present, without any evidence  of aortic stenosis. There is severe thickening of the aortic valve. There is moderate calcification of the aortic valve. Pulmonic Valve: The pulmonic valve was grossly normal. Pulmonic valve regurgitation is trivial. No evidence of pulmonic stenosis. Aorta: The aortic root is normal in size and structure. Pulmonary Artery: The pulmonary artery is not well seen. Venous: IVC assessment for right atrial pressure unable to be performed due to mechanical ventilation. Additional Comments: There is pleural effusion in the left lateral region.  LEFT VENTRICLE PLAX 2D LVIDd:         3.87 cm  Diastology LVIDs:         2.58 cm  LV e' lateral:   8.16 cm/s LV PW:         0.93 cm  LV E/e' lateral: 12.3 LV IVS:        1.02 cm  LV e'  medial:    4.24 cm/s LVOT diam:     2.00 cm  LV E/e' medial:  23.6 LV SV:         52 LV SV Index:   27 LVOT Area:     3.14 cm  RIGHT VENTRICLE RV Basal diam:  4.99 cm RV S prime:     6.31 cm/s LEFT ATRIUM             Index       RIGHT ATRIUM           Index LA diam:        4.90 cm 2.55 cm/m  RA Area:     25.80 cm LA Vol (A2C):   48.2 ml 25.13 ml/m RA Volume:   102.00 ml 53.18 ml/m LA Vol (A4C):   65.0 ml 33.89 ml/m LA Biplane Vol: 61.0 ml 31.80 ml/m  AORTIC VALVE LVOT Vmax:   94.20 cm/s LVOT Vmean:  60.050 cm/s LVOT VTI:    0.166 m  AORTA Ao Root diam: 3.10 cm MITRAL VALVE                TRICUSPID VALVE MV Area (PHT): 2.69 cm     TR Peak grad:   34.3 mmHg MV Decel Time: 282 msec     TR Vmax:        293.00 cm/s MV E velocity: 100.00 cm/s MV A velocity: 102.00 cm/s  SHUNTS MV E/A ratio:  0.98         Systemic VTI:  0.17 m                             Systemic Diam: 2.00 cm Nelva Bush MD Electronically signed by Nelva Bush MD Signature Date/Time: 09/11/2019/7:14:22 AM    Final     Microbiology Recent Results (from the past 240 hour(s))  SARS CORONAVIRUS 2 (TAT 6-24 HRS) Nasopharyngeal Nasopharyngeal Swab     Status: None   Collection Time: 09/04/19  8:37 PM  Specimen: Nasopharyngeal Swab  Result Value Ref Range Status   SARS Coronavirus 2 NEGATIVE NEGATIVE Final    Comment: (NOTE) SARS-CoV-2 target nucleic acids are NOT DETECTED. The SARS-CoV-2 RNA is generally detectable in upper and lower respiratory specimens during the acute phase of infection. Negative results do not preclude SARS-CoV-2 infection, do not rule out co-infections with other pathogens, and should not be used as the sole basis for treatment or other patient management decisions. Negative results must be combined with clinical observations, patient history, and epidemiological information. The expected result is Negative. Fact Sheet for Patients: SugarRoll.be Fact Sheet for Healthcare  Providers: https://www.woods-mathews.com/ This test is not yet approved or cleared by the [place] FDA and  has been authorized for detection and/or diagnosis of SARS-CoV-2 by FDA under an Emergency Use Authorization (EUA). This EUA will remain  in effect (meaning this test can be used) for the duration of the COVID-19 declaration under Section 56 4(b)(1) of the Act, 21 U.S.C. section 360bbb-3(b)(1), unless the authorization is terminated or revoked sooner. Performed at [facility], [place] [address], [place], [place] [address]   Blood culture (routine x 2)     Status: None (Preliminary result)   Collection Time: 09/14/2019  7:50 AM   Specimen: BLOOD  Result Value Ref Range Status   Specimen Description BLOOD RIGHT HAND  Final   Special Requests   Final    BOTTLES DRAWN AEROBIC AND ANAEROBIC Blood Culture results may not be optimal due to an inadequate volume of blood received in culture bottles   Culture   Final    NO GROWTH 3 DAYS Performed at [facility], [address] [place], [place] [address]    Report Status PENDING  Incomplete  Urine Culture     Status: None   Collection Time: 09/18/2019  8:03 AM   Specimen: Urine, Catheterized  Result Value Ref Range Status   Specimen Description   Final    URINE, CATHETERIZED Performed at [facility], [address] [place], [place] [address]    Special Requests   Final    Immunocompromised Performed at [facility], [address] [place], [place] [address]    Culture   Final    NO GROWTH Performed at [facility], [place] [address] [place], [place] [address]    Report Status 09/10/2019 FINAL  Final  Blood culture (routine x 2)     Status: None (Preliminary result)   Collection Time: 09/14/2019 11:46 AM   Specimen: BLOOD  Result Value Ref Range Status   Specimen Description BLOOD BLOOD RIGHT HAND  Final   Special Requests   Final    BOTTLES DRAWN AEROBIC AND ANAEROBIC  Blood Culture adequate volume   Culture   Final    NO GROWTH 3 DAYS Performed at [facility], [address], [place], [place] [address]    Report Status PENDING  Incomplete  MRSA PCR Screening     Status: None   Collection Time: 09/07/2019  1:19 PM   Specimen: Nasopharyngeal  Result Value Ref Range Status   MRSA by PCR NEGATIVE NEGATIVE Final    Comment:        The GeneXpert MRSA Assay (FDA approved for NASAL specimens only), is one component of a comprehensive MRSA colonization surveillance program. It is not intended to diagnose MRSA infection nor to guide or monitor treatment for MRSA infections. Performed at [facility], [address] [place], [place] [address]  Lab Basic Metabolic Panel: Recent Labs  Lab 09/11/2019 2057 09/13/2019 2057 09/10/19 0223 09/10/19 0437 09/10/19 1553 09/11/19 0426 09/11/19 0427 09/11/19 1500 09/29/2019 0108 09-29-19 0429  NA 137   < >  --  134* 137 137  --  138  --  139  K 4.6   < >  --  4.5 6.0* 5.6*  --  4.9  --  5.3*  CL 93*   < >  --  97* 97* 99  --  100  --  102  CO2 23   < >  --  20* 20* 20*  --  20*  --  14*  GLUCOSE 288*   < >  --  371* 267* 235*  --  167*  --  72  BUN 58*   < >  --  51* 45* 42*  --  41*  --  40*  CREATININE 9.19*   < >  --  7.99* 6.79* 5.70*  --  4.80*  --  4.47*  CALCIUM 7.4*   < >  --  6.9* 7.4* 7.5*  --  8.2*  --  8.3*  MG 2.8*  --  2.7*  --   --   --  2.5*  --  2.6*  --   PHOS 4.3   < >  --  3.9 4.7* 5.0*  --  4.2  --  6.1*   < > = values in this interval not displayed.   Liver Function Tests: Recent Labs  Lab 09/07/19 0655 09/07/19 0655 09/24/2019 0749 09/03/2019 1534 09/10/19 0437 09/10/19 1553 09/11/19 0426 09/11/19 1500 2019-09-29 0429  AST 65*  --  56*  --   --   --   --   --   --   ALT 41  --  31  --   --   --   --   --   --   ALKPHOS 146*  --  107  --   --   --   --   --   --   BILITOT 1.7*  --  1.4*  --   --   --   --   --   --   PROT 6.8  --  5.2*  --   --   --   --    --   --   ALBUMIN 2.6*   < > 2.0*   < > 2.4* 2.5* 2.5* 2.4* 2.5*   < > = values in this interval not displayed.   No results for input(s): LIPASE, AMYLASE in the last 168 hours. No results for input(s): AMMONIA in the last 168 hours. CBC: Recent Labs  Lab 09/07/19 0655 09/22/2019 0749 09/25/2019 0924 09/10/19 0223 09/11/19 0427  WBC 12.3* 12.6* 24.3* 19.9* 21.3*  NEUTROABS 8.7* 10.2*  --   --   --   HGB 8.9* 6.1* 9.1* 8.8* 8.4*  HCT 28.4* 22.3* 30.6* 27.8* 27.0*  MCV 86.9 99.6 94.2 86.9 88.5  PLT 292 178 323 240 225   Cardiac Enzymes: No results for input(s): CKTOTAL, CKMB, CKMBINDEX, TROPONINI in the last 168 hours. Sepsis Labs: Recent Labs  Lab 09/27/2019 0749 09/08/2019 0924 09/10/19 0223 09/11/19 0427 09/11/19 1028  WBC 12.6* 24.3* 19.9* 21.3*  --   LATICACIDVEN >11.0* >11.0*  --   --  5.8*      Cleva Camero 2019-09-29, 5:54 PM

## 2019-10-03 DEATH — deceased

## 2020-01-21 ENCOUNTER — Telehealth: Payer: Self-pay | Admitting: Internal Medicine

## 2020-01-21 NOTE — Telephone Encounter (Signed)
Called and spoke to [place] with [facility].  Gara Kroner is questioning if pt had hx of covid and if so can this be listed on death certificate.   Dr. Mortimer Fries, please advise. Thanks

## 2020-01-21 NOTE — Telephone Encounter (Signed)
ALL COVID TESTS WERE TESTED NEGATIVE ACCORDING TO CHART

## 2020-01-21 NOTE — Telephone Encounter (Signed)
Kieth Brightly with [facility] is aware of below message and voiced her understanding.  Nothing further is needed.

## 2020-01-21 NOTE — Telephone Encounter (Signed)
Radene Gunning director phone number is [phone].

## 2020-02-11 ENCOUNTER — Ambulatory Visit: Payer: Medicare Other | Admitting: Cardiovascular Disease

## 2020-05-18 ENCOUNTER — Ambulatory Visit (INDEPENDENT_AMBULATORY_CARE_PROVIDER_SITE_OTHER): Payer: Medicare Other | Admitting: Vascular Surgery

## 2020-05-18 ENCOUNTER — Encounter (INDEPENDENT_AMBULATORY_CARE_PROVIDER_SITE_OTHER): Payer: Medicare Other

## 2020-05-28 IMAGING — MR MR CERVICAL SPINE W/O CM
5 series · 35 of 48 positions shown · non-contrast
Comparison: Cervical spine CT 01/04/2018.  PET-CT 02/21/2018.

ADDENDUM:
Critical Value/emergent results were called by telephone at the time
of interpretation on 02/23/2018 at [DATE] to Dr. JOSIEL ALEJANDRO
LABELLE SALHA , who verbally acknowledged these results.
CLINICAL DATA: Fever of unknown origin with findings on PET-CT
suspicious for discitis and osteomyelitis at C6-7. MRSA bacteremia.
Diabetes. End-stage renal disease.

EXAM:
MRI CERVICAL SPINE WITHOUT CONTRAST
TECHNIQUE: Multiplanar, multisequence MR imaging of the cervical spine was
performed. No intravenous contrast was administered.

[Series 5: T2 · sagittal · 3.0mm · 0.69mm/px · 6 of 15 slices shown (1 of 2)]
[im 1/15]
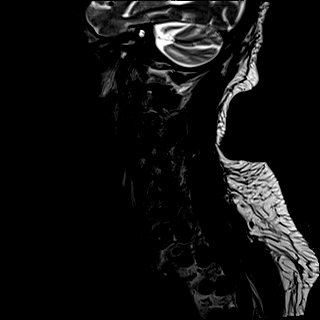
[im 3/15]
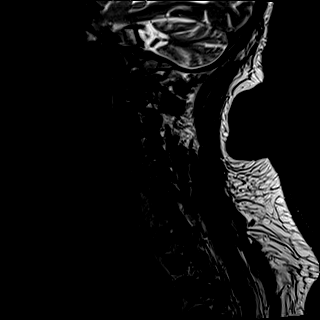
[im 6/15]
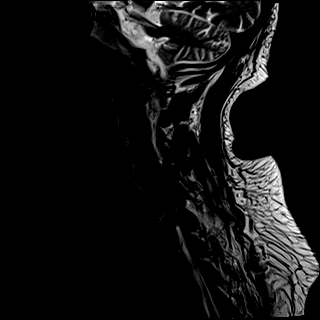
[im 9/15]
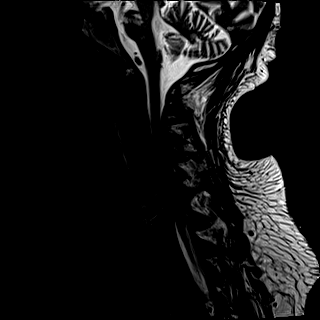
[im 12/15]
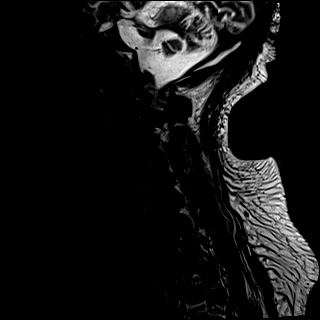
[im 15/15]
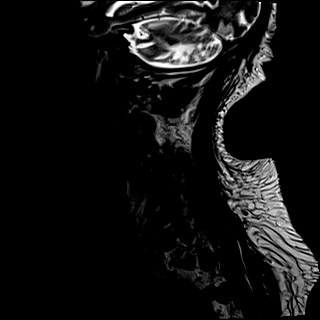

[Series 6: FLAIR · sagittal · 3.0mm · 0.86mm/px · 7 of 15 slices shown]
[im 1/15]
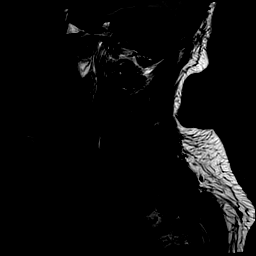
[im 3/15]
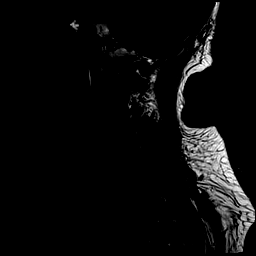
[im 5/15]
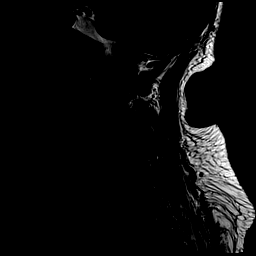
[im 8/15]
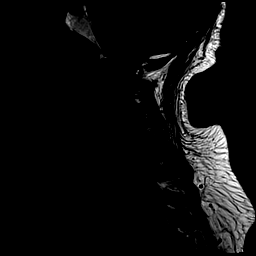
[im 10/15]
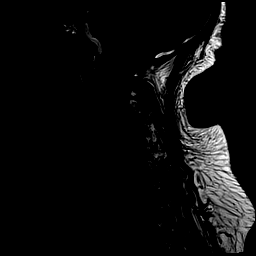
[im 12/15]
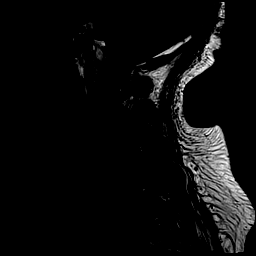
[im 15/15]
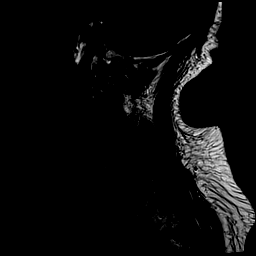

[Series 7: STIR · sagittal · 3.0mm · 0.69mm/px · 7 of 15 slices shown]
[im 1/15]
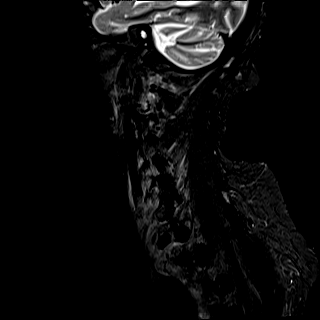
[im 3/15]
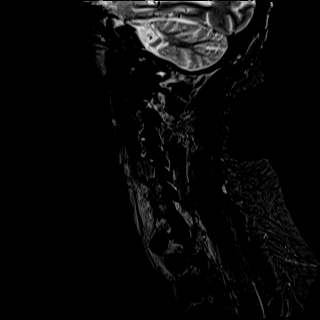
[im 5/15]
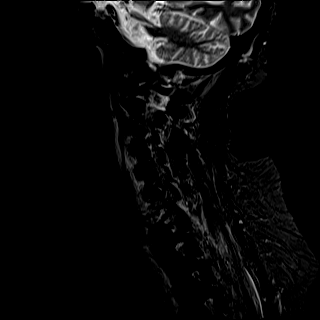
[im 8/15]
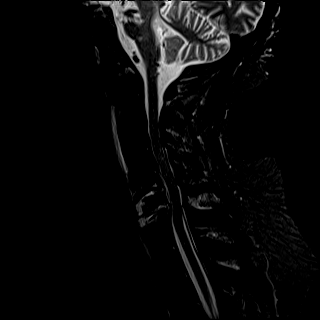
[im 10/15]
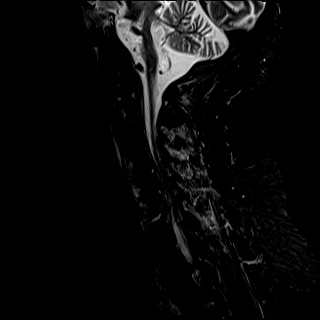
[im 12/15]
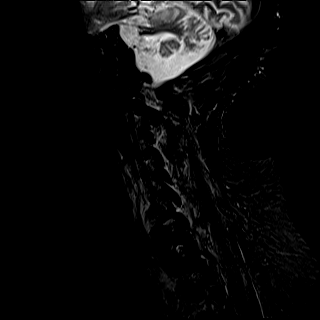
[im 15/15]
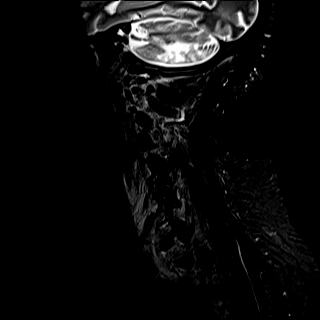

[Series 8: T2 · axial · 3.0mm · 0.70mm/px · z∈[-77,+19]mm · 8 of 29 slices shown (2 of 2)]
[im 1/29]
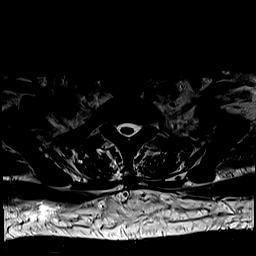
[im 5/29]
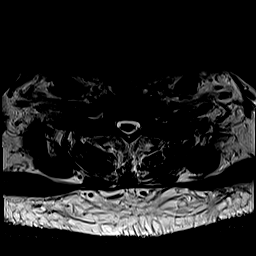
[im 9/29]
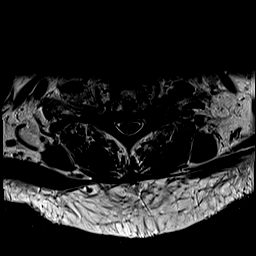
[im 13/29]
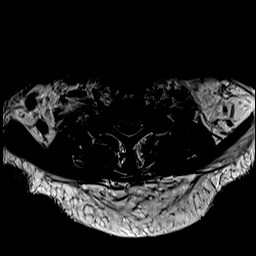
[im 16/29]
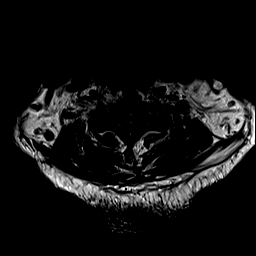
[im 20/29]
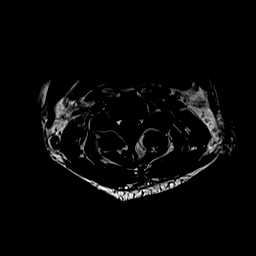
[im 24/29]
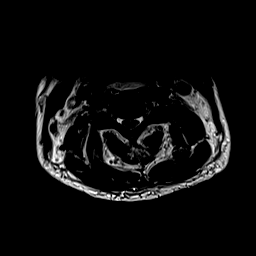
[im 29/29]
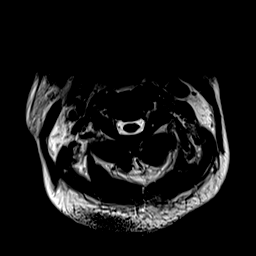

[Series 9: ax mpgr · axial · 3.0mm · 0.35mm/px · z∈[-77,+2]mm · 7 of 29 slices shown]
[im 1/29]
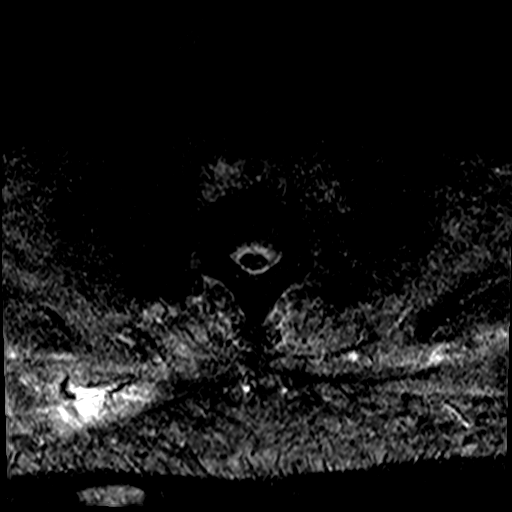
[im 5/29]
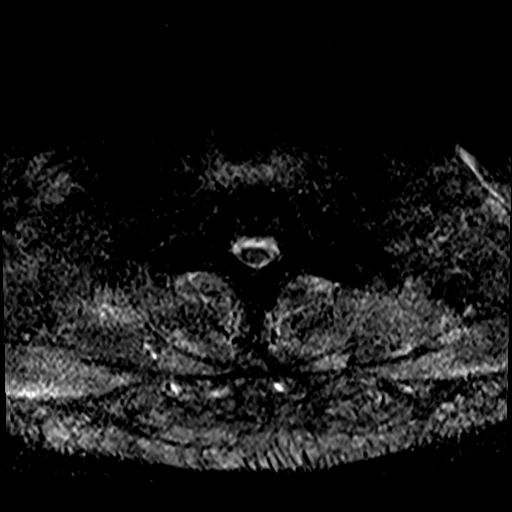
[im 9/29]
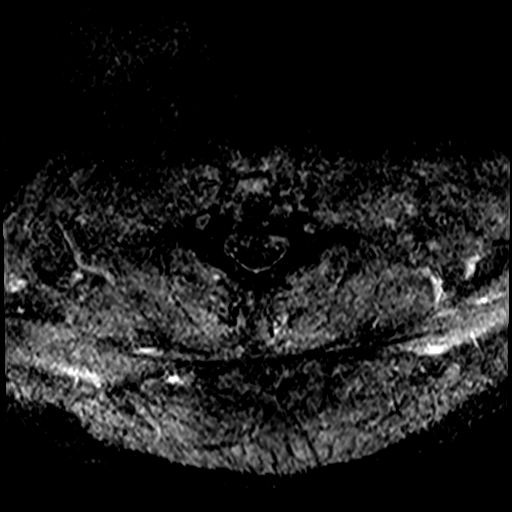
[im 13/29]
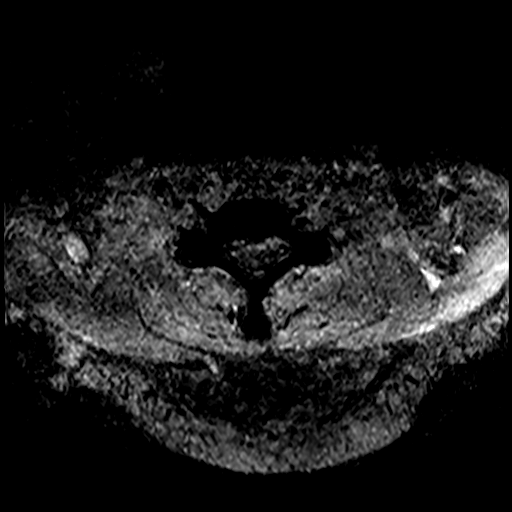
[im 16/29]
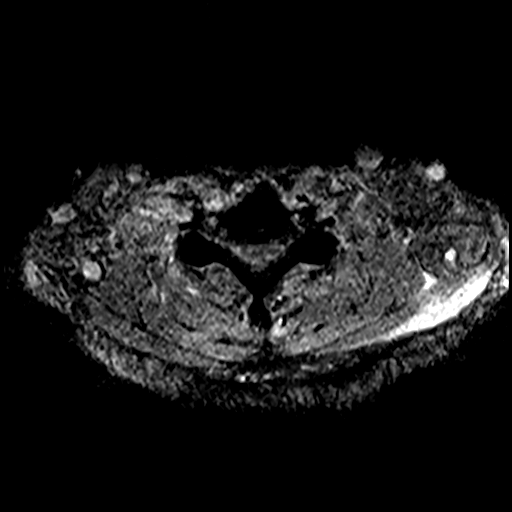
[im 20/29]
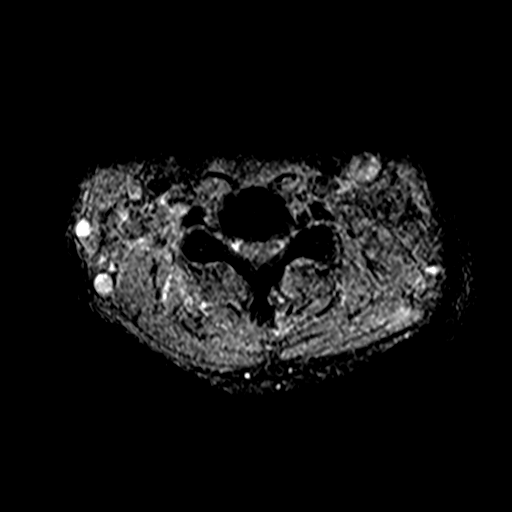
[im 24/29]
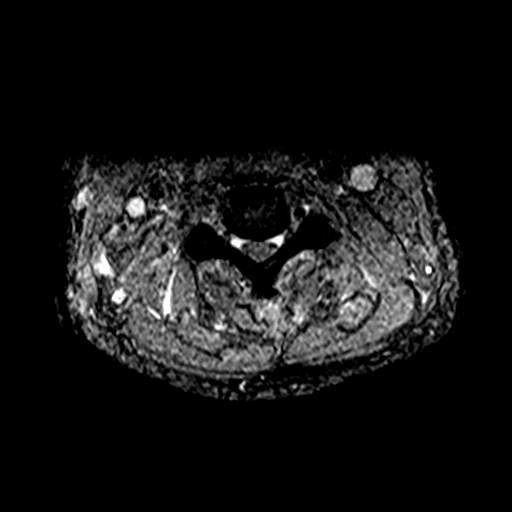

[35 of 48 positions shown; findings below may reference images not displayed]

FINDINGS: Alignment: Straightening with approximately 2 mm of retrolisthesis
at C6-7.

Vertebrae: There is intense T2 hyperintensity within the C5-6 and
C6-7 discs consistent with discitis. There is adjacent endplate
destruction and T2 hyperintensity with replacement of the normal T1
marrow signal throughout the C5, C6 and C7 vertebral bodies,
consistent with osteomyelitis.

Cord: There is a ventral epidural fluid collection measuring up to 4
mm in thickness on sagittal image [DATE]. This results in flattening of
the cervical cord and narrowing of the AP diameter of the canal to
approximately 7 mm. No definite abnormal signal within the cord.

Posterior Fossa, vertebral arteries, paraspinal tissues: There is
atrophy in the posterior fossa with prominent small vessel ischemic
changes in the pons. There are bilateral vertebral artery flow
voids. There are inflammatory changes within the paraspinal soft
tissues at the level of the discitis/osteomyelitis. There is
superior extension of fluid in the retropharyngeal space, extending
to the C2 level.

Disc levels:

No significant disc space findings at C2-3 or C3-4.

C4-5: Mild disc bulging and bilateral uncinate spurring. The CSF
surrounding the cord is effaced without cord flattening. There is
mild foraminal narrowing bilaterally.

C5-6: Changes of diskitis and osteomyelitis as described above.
There is a ventral epidural fluid collection leading to cord
flattening. Moderate foraminal narrowing is present bilaterally.
There is underlying mild spondylosis.

C6-7: Changes of diskitis and osteomyelitis as described above.
There is a ventral epidural fluid collection contributing to cord
flattening. Moderate foraminal narrowing is present bilaterally.
There is underlying mild spondylosis.

C7-T1: No significant findings.
IMPRESSION: 1. MR confirms the presence of discitis and osteomyelitis, involving
the C5 through C7 vertebral bodies and intervening disc spaces.
2. Associated extensive paraspinal inflammation with a ventral
epidural fluid collection most likely representing an abscess. This
contributes to cord compression without abnormal cord signal. In
addition, there is a retropharyngeal effusion. Expedited
neurosurgical consultation recommended.
3. Relatively mild underlying spondylosis contributes to effacement
of the CSF surrounding the cord at the C4-5 level.
